# Patient Record
Sex: Female | Born: 1955
Health system: Southern US, Community
[De-identification: ages and names within clinical notes are randomized; demographics above are authoritative.]

## PROBLEM LIST (undated history)

## (undated) DIAGNOSIS — B009 Herpesviral infection, unspecified: Secondary | ICD-10-CM

## (undated) DIAGNOSIS — Z9889 Other specified postprocedural states: Secondary | ICD-10-CM

## (undated) DIAGNOSIS — E039 Hypothyroidism, unspecified: Secondary | ICD-10-CM

## (undated) DIAGNOSIS — Z87898 Personal history of other specified conditions: Secondary | ICD-10-CM

## (undated) DIAGNOSIS — J189 Pneumonia, unspecified organism: Secondary | ICD-10-CM

## (undated) DIAGNOSIS — K759 Inflammatory liver disease, unspecified: Secondary | ICD-10-CM

## (undated) DIAGNOSIS — M545 Low back pain, unspecified: Secondary | ICD-10-CM

## (undated) DIAGNOSIS — G610 Guillain-Barre syndrome: Secondary | ICD-10-CM

## (undated) DIAGNOSIS — M1990 Unspecified osteoarthritis, unspecified site: Secondary | ICD-10-CM

## (undated) DIAGNOSIS — G459 Transient cerebral ischemic attack, unspecified: Secondary | ICD-10-CM

## (undated) DIAGNOSIS — E2740 Unspecified adrenocortical insufficiency: Secondary | ICD-10-CM

## (undated) DIAGNOSIS — K589 Irritable bowel syndrome without diarrhea: Secondary | ICD-10-CM

## (undated) DIAGNOSIS — K219 Gastro-esophageal reflux disease without esophagitis: Secondary | ICD-10-CM

## (undated) DIAGNOSIS — R0600 Dyspnea, unspecified: Secondary | ICD-10-CM

## (undated) DIAGNOSIS — R112 Nausea with vomiting, unspecified: Secondary | ICD-10-CM

## (undated) DIAGNOSIS — F419 Anxiety disorder, unspecified: Secondary | ICD-10-CM

## (undated) DIAGNOSIS — Z8719 Personal history of other diseases of the digestive system: Secondary | ICD-10-CM

## (undated) DIAGNOSIS — G2581 Restless legs syndrome: Secondary | ICD-10-CM

## (undated) DIAGNOSIS — Z8669 Personal history of other diseases of the nervous system and sense organs: Secondary | ICD-10-CM

## (undated) DIAGNOSIS — J45909 Unspecified asthma, uncomplicated: Secondary | ICD-10-CM

## (undated) DIAGNOSIS — G8929 Other chronic pain: Secondary | ICD-10-CM

## (undated) DIAGNOSIS — R0789 Other chest pain: Secondary | ICD-10-CM

## (undated) DIAGNOSIS — J42 Unspecified chronic bronchitis: Secondary | ICD-10-CM

## (undated) HISTORY — DX: Dyspnea, unspecified: R06.00

## (undated) HISTORY — DX: Irritable bowel syndrome, unspecified: K58.9

## (undated) HISTORY — PX: SHOULDER ARTHROSCOPY W/ ROTATOR CUFF REPAIR: SHX2400

## (undated) HISTORY — PX: ANTERIOR CERVICAL DECOMP/DISCECTOMY FUSION: SHX1161

## (undated) HISTORY — DX: Other chest pain: R07.89

## (undated) HISTORY — PX: BREAST BIOPSY: SHX20

## (undated) HISTORY — PX: APPENDECTOMY: SHX54

## (undated) HISTORY — PX: TOTAL SHOULDER REPLACEMENT: SUR1217

## (undated) HISTORY — DX: Anxiety disorder, unspecified: F41.9

## (undated) HISTORY — PX: VAGINAL HYSTERECTOMY: SUR661

## (undated) HISTORY — PX: CHOLECYSTECTOMY OPEN: SUR202

## (undated) HISTORY — PX: BACK SURGERY: SHX140

---
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
## 2001-12-19 ENCOUNTER — Ambulatory Visit (HOSPITAL_COMMUNITY): Admission: RE | Admit: 2001-12-19 | Discharge: 2001-12-19 | Payer: Self-pay | Admitting: Internal Medicine

## 2002-12-18 ENCOUNTER — Ambulatory Visit (HOSPITAL_COMMUNITY): Admission: RE | Admit: 2002-12-18 | Discharge: 2002-12-18 | Payer: Self-pay | Admitting: Family Medicine

## 2002-12-18 ENCOUNTER — Encounter: Payer: Self-pay | Admitting: Family Medicine

## 2003-04-12 ENCOUNTER — Encounter: Payer: Self-pay | Admitting: Neurosurgery

## 2003-04-16 ENCOUNTER — Encounter: Payer: Self-pay | Admitting: Neurosurgery

## 2003-04-16 ENCOUNTER — Inpatient Hospital Stay (HOSPITAL_COMMUNITY): Admission: RE | Admit: 2003-04-16 | Discharge: 2003-04-17 | Payer: Self-pay | Admitting: Neurosurgery

## 2005-07-02 ENCOUNTER — Ambulatory Visit (HOSPITAL_COMMUNITY): Admission: RE | Admit: 2005-07-02 | Discharge: 2005-07-02 | Payer: Self-pay | Admitting: Family Medicine

## 2005-08-04 ENCOUNTER — Ambulatory Visit: Payer: Self-pay | Admitting: Internal Medicine

## 2007-04-04 ENCOUNTER — Ambulatory Visit (HOSPITAL_COMMUNITY): Admission: RE | Admit: 2007-04-04 | Discharge: 2007-04-04 | Payer: Self-pay | Admitting: Family Medicine

## 2008-03-12 ENCOUNTER — Ambulatory Visit (HOSPITAL_COMMUNITY): Admission: RE | Admit: 2008-03-12 | Discharge: 2008-03-12 | Payer: Self-pay | Admitting: Family Medicine

## 2008-09-27 ENCOUNTER — Ambulatory Visit (HOSPITAL_COMMUNITY): Admission: RE | Admit: 2008-09-27 | Discharge: 2008-09-27 | Payer: Self-pay | Admitting: Family Medicine

## 2009-01-25 ENCOUNTER — Ambulatory Visit (HOSPITAL_COMMUNITY): Admission: RE | Admit: 2009-01-25 | Discharge: 2009-01-25 | Payer: Self-pay | Admitting: Family Medicine

## 2009-02-11 ENCOUNTER — Ambulatory Visit (HOSPITAL_COMMUNITY): Admission: RE | Admit: 2009-02-11 | Discharge: 2009-02-11 | Payer: Self-pay | Admitting: Family Medicine

## 2009-11-26 ENCOUNTER — Ambulatory Visit (HOSPITAL_COMMUNITY): Admission: RE | Admit: 2009-11-26 | Discharge: 2009-11-26 | Payer: Self-pay | Admitting: Family Medicine

## 2009-12-13 ENCOUNTER — Ambulatory Visit: Payer: Self-pay | Admitting: Cardiovascular Disease

## 2009-12-13 DIAGNOSIS — R0789 Other chest pain: Secondary | ICD-10-CM

## 2009-12-13 DIAGNOSIS — R0602 Shortness of breath: Secondary | ICD-10-CM

## 2010-05-29 ENCOUNTER — Ambulatory Visit (HOSPITAL_COMMUNITY): Admission: RE | Admit: 2010-05-29 | Discharge: 2010-05-29 | Payer: Self-pay | Admitting: Family Medicine

## 2010-10-07 NOTE — Assessment & Plan Note (Signed)
Summary: **per Dr.Cresenzo for Dyspnea/Atypical Chest Pain/tg   Visit Type:  Initial Consult Primary Provider:  DR.MARK CRESENZO  CC:  SOB, RIGHT ARM PAIN, and CHEST TIGHTNESS.  History of Present Illness: Kathryn [name] is seen today at the request of Dr Jordan Likes.  She has atypical sscp and dyspnea.  She is a previous smoker quitting 10 years ago.  Last CXR showed bronchitic changes.  She has been anxious.  She was given some anxiolytics by Dr Jordan Likes and this helped.  Her chest pressure is usually in associatin with stress including working overtime at the [employer].  She denies fever, sputum cough, diaphoresis, palpitations or syncope.  Her SSCP and dyspnea are persistant since January.  She also has some atypical pains in her right arm and legs.  She has had a previous anterior cervical neck fusion with some arthritis.  Current Problems (verified): 1)  Chest Pain, Atypical  (ICD-786.59) 2)  Dyspnea  (ICD-786.05)  Current Medications (verified): 1)  Valtrex 500 Mg Tabs (Valacyclovir Hcl) .... Take One Tab Daily 2)  Xanax 0.5 Mg Tabs (Alprazolam) .... Take 1/2 Tab As Needed 3)  Protonix 40 Mg Tbec (Pantoprazole Sodium) .... Take 1 Tab Daily 4)  Meloxicam 7.5 Mg Tabs (Meloxicam) .... Take 1 Tab Daily  Allergies (verified): No Known Drug Allergies  Past History:  Past Medical History: Last updated: 12/10/2009 Current Problems:  CHEST PAIN, ATYPICAL (ICD-786.59) DYSPNEA (ICD-786.05)  Past Surgical History: Last updated: 12/10/2009 Anterior cervical decompression and diskectomy and fusion of c5,c6 .(Dr.James R Hirsch,M.D)  Family History: Last updated: 12/13/2009 non-contributory  Social History: Last updated: 12/13/2009 Works at [employer] Quit smoking 10 years ago [employer] but no other activity Denies ETOH and drugs  Family History: non-contributory  Social History: Works at [employer] Quit smoking 10 years ago [employer] but no other activity Denies  ETOH and drugs  Review of Systems       Denies fever, malais, weight loss, blurry vision, decreased visual acuity, cough, sputum, , hemoptysis, pleuritic pain, palpitaitons, heartburn, abdominal pain, melena, lower extremity edema, claudication, or rash.   Vital Signs:  Patient profile:   55 year old female Height:      67 inches Weight:      148 pounds BMI:     23.26 Pulse rate:   59 / minute BP sitting:   122 / 66  (right arm)  Vitals Entered By: Dreama Saa, CNA (December 13, 2009 11:21 AM)  Physical Exam  General:  Affect appropriate Healthy:  appears stated age HEENT: normal Neck supple with no adenopathy JVP normal no bruits no thyromegaly Lungs clear with no wheezing and good diaphragmatic motion Heart:  S1/S2 no murmur,rub, gallop or click PMI normal Abdomen: benighn, BS positve, no tenderness, no AAA no bruit.  No HSM or HJR Distal pulses intact with no bruits No edema Neuro non-focal Skin warm and dry Anterior cervical fusion   Impression & Recommendations:  Problem # 1:  CHEST PAIN, ATYPICAL (ICD-786.59) Doubt cardiac etiology.  Likely related to anxiety Stress echo Orders: Stress Echo (Stress Echo)  Problem # 2:  DYSPNEA (ICD-786.05) Previous smoker with bronchitic changes on CXR.  PFT's and echo  F/U Crezenzo Orders: Pulmonary Function Test (PFT)  Patient Instructions: 1)  Your physician recommends that you schedule a follow-up appointment in: as needed (may need follow uo depending tests results). 2)  Your physician has requested that you have a stress echocardiogram. For further information please visit [email or web address]  Please follow  instruction sheet as given. 3)  Your physician has recommended that you have a pulmonary function test.  Pulmonary Function Tests are a group of tests that measure how well air moves in and out of your lungs.   Echocardiogram Report  Procedure date:  11/26/2009  Findings:      NSR  Normal ECG

## 2010-10-07 NOTE — Letter (Signed)
Summary: Stress Echocardiogram Information Sheet  [facility]  [address] [place] [address]   Phone: [phone]  Fax: [phone]      December 13, 2009 MRN: [id number] light prior to the test.   Kathryn [name]  Doctor: Appointment Date: Appointment Time: Appointment Location: [facility]  Stress Echocardiogram Information Sheet    Instructions:   1. DO NOT  take your _________ medicine _______ days before the test.  2. Do not eat or drink after midnight the night before test.  3. Dress prepared to exercise.  4. DO NOT use ANY caffine or tobacco products 3 hours before appointment.  5. Report to the Short Stay Center on the1st floor.  6. Please bring all current prescription medications.  7. If you have any questions, please call [phone]

## 2011-01-23 NOTE — Op Note (Signed)
NAME:  Kathryn [name], Kathryn [name]                          ACCOUNT NO.:  [id number]   MEDICAL RECORD NO.:  [id number]                   PATIENT TYPE:  INP   LOCATION:  3172                                 FACILITY:  [facility]   PHYSICIAN:  Clydene Fake, M.D.               DATE OF BIRTH:  [birth year]   DATE OF PROCEDURE:  04/16/2003  DATE OF DISCHARGE:                                 OPERATIVE REPORT   PREOPERATIVE DIAGNOSIS:  Herniated nucleus pulposis and spondylosis of C5-6  and C6-7 with right-sided radiculopathy.   POSTOPERATIVE DIAGNOSIS:  Herniated nucleus pulposis and spondylosis of C5-6  and C6-7 with right-sided radiculopathy.   PROCEDURES:  Anterior cervical decompression and diskectomy and fusion of C5-  6 and C6-7 with LifeNet allograft to bone and tether anterior cervical  plate.   SURGEON:  Clydene Fake, M.D.   ASSISTANT:  Coletta Memos, M.D.   ANESTHESIA:  General endotracheal tube __________.   DRAINS:  None.   COMPLICATIONS:  None.   REASON FOR PROCEDURE:  The patient is a 55 year old woman who has had neck  and right arm pain and numbness not improving with conservative management.  MRI was done showing spondylosis, HNP, and foraminal narrowing at C5-6 and  C6-7, worse on the right.  The patient was brought in for decompression and  fusion.   PROCEDURE IN DETAIL:  The patient was brought into the operating room.  General anesthesia was induced.  The patient was placed in traction with 10  pounds and prepped and draped in the sterile fashion.  The cervical incision  was injected with 10 mL of 1% lidocaine with epinephrine.  After she was  prepped and draped in the sterile fashion, an incision was then made in the  left side of the neck through the midline to the anterior border of the  sternocleidomastoid muscle.  The incision was taken down to the platysma.  Hemostasis was obtained with Bovie cauterization.  The platysma was opened  with the Bovie and blunt  dissection was taken through the anterior cervical  fascia of the anterior cervical spine.  Large osteophytes were seen.  This  was felt to be C6-7.  A needle was placed in the disk space above that.  X-  rays were obtained confirming our position at C5-6.  The disk space was  incised and partial diskectomy was performed with pituitary rongeur as the  needle was removed.  The longus colli muscle was reflected laterally on each  side using the Bovie.  A self-retaining retractor system was placed.  Extraction pins were placed in C5 and C7.  Osteophyte was removed at C6-7.  Diskectomy was performed with pituitary rongeurs and curets at both C5-6 and  C6-7.  Kerrison punches were used to remove osteophytes.  Curets and  pituitary rongeurs were used to continue with the diskectomy and then a 1 mm  Kerrison punch  was used to remove posterior osteophytes and the posterior  ligament.  Then 2 mm clips were used to finish the decompression, removing  osteophytes and decompressing the central canal.  We performed bilateral  foraminotomies on the right side at C5-6.  A disk fragment was seen out in  the canal.  This was removed, decompressing the root.  When we were  finished, we had decompression of the roots at C5-6, especially on the right  side.  This process was repeated at C6-7 with decompression of the central  canal and bilateral foramen.  Hemostasis was obtained with Gelfoam.  This  was then irrigated out.  The interspaces were measured with the LifeNet bone  trial and they both  measured for 6 mm grafts.  The broach was used to  roughen the inputs and a 6 mm bone graft was tacked into both C5-6 and C6-7  and countersunk about 1 mm.  We checked behind the graft and there was  plenty of room between bone graft and dura at both levels.  Distraction pins  were removed.  Hemostasis was obtained with Gelfoam and thrombin.  A tether  anterior cervical splint was placed on the anterior cervical spine  with two  screws placed in C7, two screws placed in C5, and one in C6.  They were  taken down and x-ray was obtained showing good position of plates, screws,  and interbody bone plugs at both C5-6 and C6-7.  The wound was irrigated  with antibiotic suture.  The retractor was removed.  Hemostasis was obtained  with bipolar cauterization and Gelfoam.  The Gelfoam was irrigated out.  The  platysma was closed with 3-0 Vicryl interrupted suture.  The subcutaneous  tissue was closed with the same and the skin was closed with Benzoin and  Steri-Strips.  The patient had a dressing placed and a soft cervical collar  placed.  She was awoken from anesthesia and transferred to the recovery room  in stable condition.                                               Clydene Fake, M.D.    JRH/MEDQ  D:  04/16/2003  T:  04/16/2003  Job:  [id number]

## 2011-08-19 ENCOUNTER — Encounter: Payer: Self-pay | Admitting: Cardiology

## 2012-04-05 ENCOUNTER — Other Ambulatory Visit (HOSPITAL_COMMUNITY): Payer: Self-pay | Admitting: Family Medicine

## 2012-04-05 DIAGNOSIS — M549 Dorsalgia, unspecified: Secondary | ICD-10-CM

## 2012-04-05 DIAGNOSIS — K4090 Unilateral inguinal hernia, without obstruction or gangrene, not specified as recurrent: Secondary | ICD-10-CM

## 2012-04-06 ENCOUNTER — Ambulatory Visit (HOSPITAL_COMMUNITY)
Admission: RE | Admit: 2012-04-06 | Discharge: 2012-04-06 | Disposition: A | Discharge status: Home / Self Care | Payer: BC Managed Care – PPO | Source: Ambulatory Visit | Attending: Family Medicine | Admitting: Family Medicine

## 2012-04-06 DIAGNOSIS — K4090 Unilateral inguinal hernia, without obstruction or gangrene, not specified as recurrent: Secondary | ICD-10-CM | POA: Insufficient documentation

## 2012-04-06 DIAGNOSIS — M549 Dorsalgia, unspecified: Secondary | ICD-10-CM | POA: Insufficient documentation

## 2012-07-21 ENCOUNTER — Ambulatory Visit (HOSPITAL_COMMUNITY)
Admission: RE | Admit: 2012-07-21 | Discharge: 2012-07-21 | Disposition: A | Discharge status: Home / Self Care | Payer: BC Managed Care – PPO | Source: Ambulatory Visit | Attending: Family Medicine | Admitting: Family Medicine

## 2012-07-21 ENCOUNTER — Other Ambulatory Visit (HOSPITAL_COMMUNITY): Payer: Self-pay | Admitting: Family Medicine

## 2012-07-21 DIAGNOSIS — R079 Chest pain, unspecified: Secondary | ICD-10-CM | POA: Insufficient documentation

## 2012-07-21 DIAGNOSIS — R0602 Shortness of breath: Secondary | ICD-10-CM | POA: Insufficient documentation

## 2013-05-31 ENCOUNTER — Other Ambulatory Visit: Payer: Self-pay | Admitting: Neurosurgery

## 2013-06-14 ENCOUNTER — Encounter (HOSPITAL_COMMUNITY): Payer: Self-pay | Admitting: Pharmacy Technician

## 2013-06-15 ENCOUNTER — Encounter (HOSPITAL_COMMUNITY): Payer: Self-pay

## 2013-06-15 ENCOUNTER — Encounter (HOSPITAL_COMMUNITY)
Admission: RE | Admit: 2013-06-15 | Discharge: 2013-06-15 | Disposition: A | Discharge status: Home / Self Care | Payer: BC Managed Care – PPO | Source: Ambulatory Visit | Attending: Neurosurgery | Admitting: Neurosurgery

## 2013-06-15 DIAGNOSIS — Z01812 Encounter for preprocedural laboratory examination: Secondary | ICD-10-CM | POA: Insufficient documentation

## 2013-06-15 HISTORY — DX: Nausea with vomiting, unspecified: R11.2

## 2013-06-15 HISTORY — DX: Guillain-Barre syndrome: G61.0

## 2013-06-15 HISTORY — DX: Gastro-esophageal reflux disease without esophagitis: K21.9

## 2013-06-15 HISTORY — DX: Herpesviral infection, unspecified: B00.9

## 2013-06-15 HISTORY — DX: Other specified postprocedural states: Z98.890

## 2013-06-15 HISTORY — DX: Personal history of other diseases of the digestive system: Z87.19

## 2013-06-15 HISTORY — DX: Unspecified osteoarthritis, unspecified site: M19.90

## 2013-06-15 HISTORY — DX: Inflammatory liver disease, unspecified: K75.9

## 2013-06-15 HISTORY — DX: Personal history of other specified conditions: Z87.898

## 2013-06-15 LAB — URINALYSIS, ROUTINE W REFLEX MICROSCOPIC
Glucose, UA: NEGATIVE mg/dL
Hgb urine dipstick: NEGATIVE
Leukocytes, UA: NEGATIVE
Nitrite: NEGATIVE
Protein, ur: NEGATIVE mg/dL
Specific Gravity, Urine: 1.005 (ref 1.005–1.030)
pH: 5.5 (ref 5.0–8.0)

## 2013-06-15 LAB — BASIC METABOLIC PANEL
CO2: 27 mEq/L (ref 19–32)
Calcium: 9.3 mg/dL (ref 8.4–10.5)
GFR calc Af Amer: 88 mL/min — ABNORMAL LOW (ref >90)
GFR calc non Af Amer: 76 mL/min — ABNORMAL LOW (ref >90)
Sodium: 141 mEq/L (ref 135–145)

## 2013-06-15 LAB — CBC WITH DIFFERENTIAL/PLATELET
Basophils Absolute: 0 10*3/uL (ref 0.0–0.1)
Eosinophils Absolute: 0.1 10*3/uL (ref 0.0–0.7)
HCT: 37.1 % (ref 36.0–46.0)
Lymphocytes Relative: 33 % (ref 12–46)
Lymphs Abs: 2.1 10*3/uL (ref 0.7–4.0)
Neutro Abs: 3.5 10*3/uL (ref 1.7–7.7)
Neutrophils Relative %: 56 % (ref 43–77)
Platelets: 263 10*3/uL (ref 150–400)
RBC: 3.98 MIL/uL (ref 3.87–5.11)
WBC: 6.3 10*3/uL (ref 4.0–10.5)

## 2013-06-15 LAB — TYPE AND SCREEN
ABO/RH(D): AB NEG
Antibody Screen: NEGATIVE

## 2013-06-15 LAB — SURGICAL PCR SCREEN
MRSA, PCR: NEGATIVE
Staphylococcus aureus: NEGATIVE

## 2013-06-15 LAB — PROTIME-INR: Prothrombin Time: 12.6 seconds (ref 11.6–15.2)

## 2013-06-15 LAB — APTT: aPTT: 28 seconds (ref 24–37)

## 2013-06-15 LAB — ABO/RH: ABO/RH(D): AB NEG

## 2013-06-15 NOTE — Progress Notes (Signed)
Primary physician - [facility] Does not see a cardiologist on routine basis. Did see Susank one time but did not followup

## 2013-06-15 NOTE — Pre-Procedure Instructions (Signed)
Kathryn [name]  06/15/2013   Your procedure is scheduled on:  Thursday, October 16th  Report to Main Entrance "A" and check in with admitting at 0730 AM.  Call this number if you have problems the morning of surgery: [phone]   Remember:   Do not eat food or drink liquids after midnight.   Take these medicines the morning of surgery with A SIP OF WATER: xanax if needed, protonix, claritin, eye drops if needed  Stop taking ibuprofen, over the counter vitamins/herbal medications, meloxicam 5 days prior to surgery   Do not wear jewelry, make-up or nail polish.  Do not wear lotions, powders, or perfumes. You may wear deodorant.  Do not shave 48 hours prior to surgery. Men may shave face and neck.  Do not bring valuables to the hospital.  [facility] is not responsible   for any belongings or valuables.               Contacts, dentures or bridgework may not be worn into surgery.  Leave suitcase in the car. After surgery it may be brought to your room.  For patients admitted to the hospital, discharge time is determined by your  treatment team.               Patients discharged the day of surgery will not be allowed to drive home.   Special Instructions: Shower using CHG 2 nights before surgery and the night before surgery.  If you shower the day of surgery use CHG.  Use special wash - you have one bottle of CHG for all showers.  You should use approximately 1/3 of the bottle for each shower.   Please read over the following fact sheets that you were given: Pain Booklet, Coughing and Deep Breathing, Blood Transfusion Information, MRSA Information and Surgical Site Infection Prevention

## 2013-06-21 MED ORDER — CEFAZOLIN SODIUM-DEXTROSE 2-3 GM-% IV SOLR
2.0000 g | INTRAVENOUS | Status: AC
  Administered 2013-06-22: 2 g via INTRAVENOUS
  Filled 2013-06-21: qty 50

## 2013-06-22 ENCOUNTER — Inpatient Hospital Stay (HOSPITAL_COMMUNITY)
Admission: RE | Admit: 2013-06-22 | Discharge: 2013-06-23 | DRG: 460 | Disposition: A | Discharge status: Home / Self Care | Payer: BC Managed Care – PPO | Source: Ambulatory Visit | Attending: Neurosurgery | Admitting: Neurosurgery

## 2013-06-22 ENCOUNTER — Encounter (HOSPITAL_COMMUNITY)
Admission: RE | Disposition: A | Discharge status: Home / Self Care | Payer: Self-pay | Source: Ambulatory Visit | Attending: Neurosurgery

## 2013-06-22 ENCOUNTER — Inpatient Hospital Stay (HOSPITAL_COMMUNITY): Payer: BC Managed Care – PPO | Admitting: Anesthesiology

## 2013-06-22 ENCOUNTER — Encounter (HOSPITAL_COMMUNITY): Payer: BC Managed Care – PPO | Admitting: Anesthesiology

## 2013-06-22 ENCOUNTER — Encounter (HOSPITAL_COMMUNITY): Payer: Self-pay | Admitting: *Deleted

## 2013-06-22 ENCOUNTER — Inpatient Hospital Stay (HOSPITAL_COMMUNITY): Payer: BC Managed Care – PPO

## 2013-06-22 DIAGNOSIS — F411 Generalized anxiety disorder: Secondary | ICD-10-CM | POA: Diagnosis present

## 2013-06-22 DIAGNOSIS — M5126 Other intervertebral disc displacement, lumbar region: Principal | ICD-10-CM | POA: Diagnosis present

## 2013-06-22 DIAGNOSIS — K449 Diaphragmatic hernia without obstruction or gangrene: Secondary | ICD-10-CM | POA: Diagnosis present

## 2013-06-22 DIAGNOSIS — K219 Gastro-esophageal reflux disease without esophagitis: Secondary | ICD-10-CM | POA: Diagnosis present

## 2013-06-22 DIAGNOSIS — Z79899 Other long term (current) drug therapy: Secondary | ICD-10-CM

## 2013-06-22 DIAGNOSIS — Q762 Congenital spondylolisthesis: Secondary | ICD-10-CM

## 2013-06-22 DIAGNOSIS — M47817 Spondylosis without myelopathy or radiculopathy, lumbosacral region: Secondary | ICD-10-CM | POA: Diagnosis present

## 2013-06-22 HISTORY — PX: LUMBAR LAMINECTOMY/DECOMPRESSION MICRODISCECTOMY: SHX5026

## 2013-06-22 SURGERY — POSTERIOR LUMBAR FUSION 1 LEVEL
Anesthesia: General | Site: Spine Lumbar | Laterality: Right | Wound class: Clean

## 2013-06-22 MED ORDER — LIDOCAINE HCL (CARDIAC) 20 MG/ML IV SOLN
INTRAVENOUS | Status: DC | PRN
  Administered 2013-06-22: 100 mg via INTRAVENOUS

## 2013-06-22 MED ORDER — ONDANSETRON HCL 4 MG/2ML IJ SOLN: 4.0000 mg | Freq: Four times a day (QID) | INTRAMUSCULAR | Status: DC | PRN

## 2013-06-22 MED ORDER — NEOSTIGMINE METHYLSULFATE 1 MG/ML IJ SOLN
INTRAMUSCULAR | Status: DC | PRN
  Administered 2013-06-22: 3 mg via INTRAVENOUS

## 2013-06-22 MED ORDER — MEPERIDINE HCL 25 MG/ML IJ SOLN: 6.2500 mg | INTRAMUSCULAR | Status: DC | PRN

## 2013-06-22 MED ORDER — ACETAMINOPHEN 650 MG RE SUPP: 650.0000 mg | RECTAL | Status: DC | PRN

## 2013-06-22 MED ORDER — PSEUDOEPHEDRINE HCL 30 MG PO TABS
30.0000 mg | ORAL_TABLET | ORAL | Status: DC | PRN
  Filled 2013-06-22: qty 1

## 2013-06-22 MED ORDER — LORATADINE 10 MG PO TABS
10.0000 mg | ORAL_TABLET | Freq: Every day | ORAL | Status: DC | PRN
  Filled 2013-06-22: qty 1

## 2013-06-22 MED ORDER — SODIUM CHLORIDE 0.9 % IJ SOLN
3.0000 mL | Freq: Two times a day (BID) | INTRAMUSCULAR | Status: DC
  Administered 2013-06-22: 3 mL via INTRAVENOUS

## 2013-06-22 MED ORDER — TRAMADOL HCL 50 MG PO TABS: 100.0000 mg | ORAL_TABLET | Freq: Four times a day (QID) | ORAL | Status: DC | PRN

## 2013-06-22 MED ORDER — ZOLPIDEM TARTRATE 5 MG PO TABS: 5.0000 mg | ORAL_TABLET | Freq: Every evening | ORAL | Status: DC | PRN

## 2013-06-22 MED ORDER — ONDANSETRON HCL 4 MG/2ML IJ SOLN: 4.0000 mg | Freq: Once | INTRAMUSCULAR | Status: DC | PRN

## 2013-06-22 MED ORDER — BISACODYL 10 MG RE SUPP: 10.0000 mg | Freq: Every day | RECTAL | Status: DC | PRN

## 2013-06-22 MED ORDER — PANTOPRAZOLE SODIUM 40 MG PO TBEC
40.0000 mg | DELAYED_RELEASE_TABLET | Freq: Every day | ORAL | Status: DC
  Administered 2013-06-23: 40 mg via ORAL
  Filled 2013-06-22: qty 1

## 2013-06-22 MED ORDER — SODIUM CHLORIDE 0.9 % IR SOLN
Status: DC | PRN
  Administered 2013-06-22: 11:00:00

## 2013-06-22 MED ORDER — ALPRAZOLAM 0.5 MG PO TABS: 0.5000 mg | ORAL_TABLET | Freq: Every day | ORAL | Status: DC | PRN

## 2013-06-22 MED ORDER — DEXAMETHASONE SODIUM PHOSPHATE 4 MG/ML IJ SOLN
INTRAMUSCULAR | Status: DC | PRN
  Administered 2013-06-22: 8 mg via INTRAVENOUS

## 2013-06-22 MED ORDER — LACTATED RINGERS IV SOLN
INTRAVENOUS | Status: DC
  Administered 2013-06-22: 19:00:00 via INTRAVENOUS

## 2013-06-22 MED ORDER — LIDOCAINE-EPINEPHRINE 1 %-1:100000 IJ SOLN
INTRAMUSCULAR | Status: DC | PRN
  Administered 2013-06-22: 19 mL

## 2013-06-22 MED ORDER — THROMBIN 20000 UNITS EX SOLR
CUTANEOUS | Status: DC | PRN
  Administered 2013-06-22: 11:00:00 via TOPICAL

## 2013-06-22 MED ORDER — SCOPOLAMINE 1 MG/3DAYS TD PT72
MEDICATED_PATCH | TRANSDERMAL | Status: DC | PRN
  Administered 2013-06-22: 1 via TRANSDERMAL

## 2013-06-22 MED ORDER — LACTATED RINGERS IV SOLN
INTRAVENOUS | Status: DC | PRN
  Administered 2013-06-22 (×2): via INTRAVENOUS

## 2013-06-22 MED ORDER — ARTIFICIAL TEARS OP OINT
TOPICAL_OINTMENT | OPHTHALMIC | Status: DC | PRN
  Administered 2013-06-22: 1 via OPHTHALMIC

## 2013-06-22 MED ORDER — PHENYLEPHRINE HCL 10 MG/ML IJ SOLN
10.0000 mg | INTRAVENOUS | Status: DC | PRN
  Administered 2013-06-22: 20 ug/min via INTRAVENOUS

## 2013-06-22 MED ORDER — PRAMIPEXOLE DIHYDROCHLORIDE 0.125 MG PO TABS
0.1250 mg | ORAL_TABLET | Freq: Every day | ORAL | Status: DC
  Administered 2013-06-22: 0.125 mg via ORAL
  Filled 2013-06-22 (×2): qty 1

## 2013-06-22 MED ORDER — MORPHINE SULFATE (PF) 1 MG/ML IV SOLN
INTRAVENOUS | Status: DC
  Administered 2013-06-22: 4 mg via INTRAVENOUS
  Administered 2013-06-22 – 2013-06-23 (×4): 1 mg via INTRAVENOUS

## 2013-06-22 MED ORDER — CEFAZOLIN SODIUM 1-5 GM-% IV SOLN
1.0000 g | Freq: Three times a day (TID) | INTRAVENOUS | Status: AC
  Administered 2013-06-22 – 2013-06-23 (×2): 1 g via INTRAVENOUS
  Filled 2013-06-22 (×2): qty 50

## 2013-06-22 MED ORDER — GLYCOPYRROLATE 0.2 MG/ML IJ SOLN
INTRAMUSCULAR | Status: DC | PRN
  Administered 2013-06-22: 0.4 mg via INTRAVENOUS

## 2013-06-22 MED ORDER — ACETAMINOPHEN 325 MG PO TABS: 650.0000 mg | ORAL_TABLET | ORAL | Status: DC | PRN

## 2013-06-22 MED ORDER — DEXTROSE 5 % IV SOLN
INTRAVENOUS | Status: DC | PRN
  Administered 2013-06-22: 10:00:00 via INTRAVENOUS

## 2013-06-22 MED ORDER — POLYVINYL ALCOHOL 1.4 % OP SOLN
1.0000 [drp] | OPHTHALMIC | Status: DC | PRN
  Filled 2013-06-22: qty 15

## 2013-06-22 MED ORDER — VITAMIN B-12 100 MCG PO TABS
100.0000 ug | ORAL_TABLET | Freq: Every day | ORAL | Status: DC
  Administered 2013-06-22: 100 ug via ORAL
  Filled 2013-06-22 (×2): qty 1

## 2013-06-22 MED ORDER — HYDROMORPHONE HCL PF 1 MG/ML IJ SOLN
0.2500 mg | INTRAMUSCULAR | Status: DC | PRN
  Administered 2013-06-22 (×2): 0.5 mg via INTRAVENOUS

## 2013-06-22 MED ORDER — VITAMIN B-6 100 MG PO TABS
100.0000 mg | ORAL_TABLET | Freq: Every day | ORAL | Status: DC
  Administered 2013-06-22: 100 mg via ORAL
  Filled 2013-06-22 (×2): qty 1

## 2013-06-22 MED ORDER — ROCURONIUM BROMIDE 100 MG/10ML IV SOLN
INTRAVENOUS | Status: DC | PRN
  Administered 2013-06-22: 20 mg via INTRAVENOUS
  Administered 2013-06-22: 50 mg via INTRAVENOUS

## 2013-06-22 MED ORDER — HYDROMORPHONE HCL PF 1 MG/ML IJ SOLN
INTRAMUSCULAR | Status: AC
  Filled 2013-06-22: qty 1

## 2013-06-22 MED ORDER — ONDANSETRON HCL 4 MG/2ML IJ SOLN
4.0000 mg | INTRAMUSCULAR | Status: DC | PRN
  Administered 2013-06-22: 4 mg via INTRAVENOUS
  Filled 2013-06-22: qty 2

## 2013-06-22 MED ORDER — SCOPOLAMINE 1 MG/3DAYS TD PT72
MEDICATED_PATCH | TRANSDERMAL | Status: AC
  Filled 2013-06-22: qty 1

## 2013-06-22 MED ORDER — NALOXONE HCL 0.4 MG/ML IJ SOLN
0.4000 mg | INTRAMUSCULAR | Status: DC | PRN
  Filled 2013-06-22: qty 1

## 2013-06-22 MED ORDER — KETOROLAC TROMETHAMINE 30 MG/ML IJ SOLN
30.0000 mg | Freq: Four times a day (QID) | INTRAMUSCULAR | Status: DC
  Administered 2013-06-22 – 2013-06-23 (×3): 30 mg via INTRAVENOUS
  Filled 2013-06-22 (×4): qty 1

## 2013-06-22 MED ORDER — OXYCODONE HCL 5 MG/5ML PO SOLN: 5.0000 mg | Freq: Once | ORAL | Status: AC | PRN

## 2013-06-22 MED ORDER — METHOCARBAMOL 500 MG PO TABS: 500.0000 mg | ORAL_TABLET | Freq: Four times a day (QID) | ORAL | Status: DC | PRN

## 2013-06-22 MED ORDER — OXYCODONE HCL 5 MG PO TABS
5.0000 mg | ORAL_TABLET | Freq: Once | ORAL | Status: AC | PRN
  Administered 2013-06-22: 5 mg via ORAL

## 2013-06-22 MED ORDER — LACTATED RINGERS IV SOLN
INTRAVENOUS | Status: DC
  Administered 2013-06-22: 07:00:00 via INTRAVENOUS

## 2013-06-22 MED ORDER — CYCLOBENZAPRINE HCL 10 MG PO TABS
10.0000 mg | ORAL_TABLET | Freq: Three times a day (TID) | ORAL | Status: DC | PRN
  Administered 2013-06-22: 10 mg via ORAL
  Filled 2013-06-22: qty 1

## 2013-06-22 MED ORDER — SODIUM CHLORIDE 0.9 % IV SOLN: 250.0000 mL | INTRAVENOUS | Status: DC

## 2013-06-22 MED ORDER — PROPOFOL 10 MG/ML IV BOLUS
INTRAVENOUS | Status: DC | PRN
  Administered 2013-06-22: 125 mg via INTRAVENOUS

## 2013-06-22 MED ORDER — MAGNESIUM HYDROXIDE 400 MG/5ML PO SUSP: 30.0000 mL | Freq: Every day | ORAL | Status: DC | PRN

## 2013-06-22 MED ORDER — OXYCODONE HCL 5 MG PO TABS
ORAL_TABLET | ORAL | Status: AC
  Filled 2013-06-22: qty 1

## 2013-06-22 MED ORDER — SODIUM CHLORIDE 0.9 % IJ SOLN: 9.0000 mL | INTRAMUSCULAR | Status: DC | PRN

## 2013-06-22 MED ORDER — VALACYCLOVIR HCL 500 MG PO TABS
500.0000 mg | ORAL_TABLET | Freq: Every day | ORAL | Status: DC
  Administered 2013-06-22: 500 mg via ORAL
  Filled 2013-06-22 (×2): qty 1

## 2013-06-22 MED ORDER — ONDANSETRON HCL 4 MG/2ML IJ SOLN
INTRAMUSCULAR | Status: DC | PRN
  Administered 2013-06-22: 4 mg via INTRAMUSCULAR

## 2013-06-22 MED ORDER — DOCUSATE SODIUM 100 MG PO CAPS
100.0000 mg | ORAL_CAPSULE | Freq: Two times a day (BID) | ORAL | Status: DC
  Administered 2013-06-22: 100 mg via ORAL
  Filled 2013-06-22: qty 1

## 2013-06-22 MED ORDER — DIPHENHYDRAMINE HCL 12.5 MG/5ML PO ELIX
12.5000 mg | ORAL_SOLUTION | Freq: Four times a day (QID) | ORAL | Status: DC | PRN
  Filled 2013-06-22: qty 5

## 2013-06-22 MED ORDER — VITAMIN D3 25 MCG (1000 UNIT) PO TABS
1000.0000 [IU] | ORAL_TABLET | Freq: Every day | ORAL | Status: DC
  Administered 2013-06-22: 1000 [IU] via ORAL
  Filled 2013-06-22 (×2): qty 1

## 2013-06-22 MED ORDER — DIPHENHYDRAMINE HCL 50 MG/ML IJ SOLN
12.5000 mg | Freq: Four times a day (QID) | INTRAMUSCULAR | Status: DC | PRN
  Filled 2013-06-22: qty 0.25

## 2013-06-22 MED ORDER — 0.9 % SODIUM CHLORIDE (POUR BTL) OPTIME
TOPICAL | Status: DC | PRN
  Administered 2013-06-22: 1000 mL

## 2013-06-22 MED ORDER — HYPROMELLOSE (GONIOSCOPIC) 2.5 % OP SOLN: 2.0000 [drp] | Freq: Three times a day (TID) | OPHTHALMIC | Status: DC | PRN

## 2013-06-22 MED ORDER — FENTANYL CITRATE 0.05 MG/ML IJ SOLN
INTRAMUSCULAR | Status: DC | PRN
  Administered 2013-06-22: 100 ug via INTRAVENOUS
  Administered 2013-06-22: 50 ug via INTRAVENOUS
  Administered 2013-06-22: 150 ug via INTRAVENOUS

## 2013-06-22 MED ORDER — MIDAZOLAM HCL 5 MG/5ML IJ SOLN
INTRAMUSCULAR | Status: DC | PRN
  Administered 2013-06-22: 2 mg via INTRAVENOUS

## 2013-06-22 MED ORDER — MORPHINE SULFATE (PF) 1 MG/ML IV SOLN
INTRAVENOUS | Status: AC
  Filled 2013-06-22: qty 25

## 2013-06-22 MED ORDER — METHOCARBAMOL 100 MG/ML IJ SOLN
500.0000 mg | Freq: Four times a day (QID) | INTRAMUSCULAR | Status: DC | PRN
  Filled 2013-06-22: qty 5

## 2013-06-22 MED ORDER — SODIUM CHLORIDE 0.9 % IJ SOLN: 3.0000 mL | INTRAMUSCULAR | Status: DC | PRN

## 2013-06-22 SURGICAL SUPPLY — 75 items
APL SKNCLS STERI-STRIP NONHPOA (GAUZE/BANDAGES/DRESSINGS) ×4
BAG DECANTER FOR FLEXI CONT (MISCELLANEOUS) ×5 IMPLANT
BENZOIN TINCTURE PRP APPL 2/3 (GAUZE/BANDAGES/DRESSINGS) ×6 IMPLANT
BLADE SURG ROTATE 9660 (MISCELLANEOUS) IMPLANT
BUR PRECISION FLUTE 5.0 (BURR) ×3 IMPLANT
BUR ROUND FLUTED 5 RND (BURR) ×3 IMPLANT
CAGE CONCORDE BULLET 9X10X23 (Cage) ×2 IMPLANT
CANISTER SUCTION 2500CC (MISCELLANEOUS) ×5 IMPLANT
CONT SPEC 4OZ CLIKSEAL STRL BL (MISCELLANEOUS) ×6 IMPLANT
COVER BACK TABLE 24X17X13 BIG (DRAPES) IMPLANT
COVER TABLE BACK 60X90 (DRAPES) ×3 IMPLANT
DECANTER SPIKE VIAL GLASS SM (MISCELLANEOUS) ×3 IMPLANT
DRAPE C-ARM 42X72 X-RAY (DRAPES) ×6 IMPLANT
DRAPE LAPAROTOMY 100X72X124 (DRAPES) ×5 IMPLANT
DRAPE MICROSCOPE LEICA (MISCELLANEOUS) ×2 IMPLANT
DRAPE POUCH INSTRU U-SHP 10X18 (DRAPES) ×5 IMPLANT
DRAPE PROXIMA HALF (DRAPES) ×1 IMPLANT
DRAPE SURG 17X23 STRL (DRAPES) ×3 IMPLANT
DRESSING TELFA 8X3 (GAUZE/BANDAGES/DRESSINGS) ×5 IMPLANT
DURAPREP 26ML APPLICATOR (WOUND CARE) ×5 IMPLANT
ELECT REM PT RETURN 9FT ADLT (ELECTROSURGICAL) ×3
ELECTRODE REM PT RTRN 9FT ADLT (ELECTROSURGICAL) ×4 IMPLANT
GAUZE SPONGE 4X4 16PLY XRAY LF (GAUZE/BANDAGES/DRESSINGS) ×1 IMPLANT
GLOVE BIO SURGEON STRL SZ8 (GLOVE) ×1 IMPLANT
GLOVE BIOGEL PI IND STRL 7.0 (GLOVE) IMPLANT
GLOVE BIOGEL PI IND STRL 7.5 (GLOVE) IMPLANT
GLOVE BIOGEL PI IND STRL 8.5 (GLOVE) IMPLANT
GLOVE BIOGEL PI INDICATOR 7.0 (GLOVE) ×1
GLOVE BIOGEL PI INDICATOR 7.5 (GLOVE) ×2
GLOVE BIOGEL PI INDICATOR 8.5 (GLOVE) ×1
GLOVE ECLIPSE 7.5 STRL STRAW (GLOVE) ×8 IMPLANT
GLOVE EXAM NITRILE LRG STRL (GLOVE) IMPLANT
GLOVE EXAM NITRILE MD LF STRL (GLOVE) IMPLANT
GLOVE EXAM NITRILE XL STR (GLOVE) IMPLANT
GLOVE EXAM NITRILE XS STR PU (GLOVE) IMPLANT
GLOVE SURG SS PI 7.0 STRL IVOR (GLOVE) ×3 IMPLANT
GOWN BRE IMP SLV AUR LG STRL (GOWN DISPOSABLE) ×4 IMPLANT
GOWN BRE IMP SLV AUR XL STRL (GOWN DISPOSABLE) ×4 IMPLANT
GOWN STRL REIN 2XL LVL4 (GOWN DISPOSABLE) ×4 IMPLANT
KIT BASIN OR (CUSTOM PROCEDURE TRAY) ×5 IMPLANT
KIT ROOM TURNOVER OR (KITS) ×5 IMPLANT
NDL HYPO 18GX1.5 BLUNT FILL (NEEDLE) IMPLANT
NDL SPNL 18GX3.5 QUINCKE PK (NEEDLE) IMPLANT
NEEDLE HYPO 18GX1.5 BLUNT FILL (NEEDLE) IMPLANT
NEEDLE HYPO 22GX1.5 SAFETY (NEEDLE) ×8 IMPLANT
NEEDLE SPNL 18GX3.5 QUINCKE PK (NEEDLE) ×3 IMPLANT
NS IRRIG 1000ML POUR BTL (IV SOLUTION) ×5 IMPLANT
PACK LAMINECTOMY NEURO (CUSTOM PROCEDURE TRAY) ×5 IMPLANT
PAD ARMBOARD 7.5X6 YLW CONV (MISCELLANEOUS) ×17 IMPLANT
PATTIES SURGICAL .75X.75 (GAUZE/BANDAGES/DRESSINGS) ×5 IMPLANT
ROD PRE BENT EXPEDIUM 35MM (Rod) ×2 IMPLANT
RUBBERBAND STERILE (MISCELLANEOUS) ×4 IMPLANT
SCREW EXPEDIUM POLYAXIAL 6X45M (Screw) ×2 IMPLANT
SCREW EXPEDIUM POLYAXIAL 6X50M (Screw) ×2 IMPLANT
SCREW SET SINGLE INNER (Screw) ×4 IMPLANT
SHEET CONFORM 45LX20WX5H (Bone Implant) ×1 IMPLANT
SPONGE GAUZE 4X4 12PLY (GAUZE/BANDAGES/DRESSINGS) ×5 IMPLANT
SPONGE LAP 4X18 X RAY DECT (DISPOSABLE) IMPLANT
SPONGE SURGIFOAM ABS GEL 100 (HEMOSTASIS) ×3 IMPLANT
SPONGE SURGIFOAM ABS GEL SZ50 (HEMOSTASIS) ×2 IMPLANT
STRIP CLOSURE SKIN 1/2X4 (GAUZE/BANDAGES/DRESSINGS) ×5 IMPLANT
SUT PROLENE 6 0 BV (SUTURE) IMPLANT
SUT VIC AB 0 CT1 18XCR BRD8 (SUTURE) ×6 IMPLANT
SUT VIC AB 0 CT1 8-18 (SUTURE) ×6
SUT VIC AB 2-0 CP2 18 (SUTURE) ×8 IMPLANT
SUT VIC AB 3-0 SH 8-18 (SUTURE) ×8 IMPLANT
SYR 20ML ECCENTRIC (SYRINGE) ×5 IMPLANT
SYR 5ML LL (SYRINGE) IMPLANT
SYR CONTROL 10ML LL (SYRINGE) ×1 IMPLANT
TAPE CLOTH SURG 4X10 WHT LF (GAUZE/BANDAGES/DRESSINGS) ×1 IMPLANT
TOWEL OR 17X24 6PK STRL BLUE (TOWEL DISPOSABLE) ×5 IMPLANT
TOWEL OR 17X26 10 PK STRL BLUE (TOWEL DISPOSABLE) ×5 IMPLANT
TRAP SPECIMEN MUCOUS 40CC (MISCELLANEOUS) ×1 IMPLANT
TRAY FOLEY CATH 14FRSI W/METER (CATHETERS) ×3 IMPLANT
WATER STERILE IRR 1000ML POUR (IV SOLUTION) ×5 IMPLANT

## 2013-06-22 NOTE — Anesthesia Postprocedure Evaluation (Signed)
Anesthesia Post Note  Patient: Kathryn [name]  Procedure(s) Performed: Procedure(s) (LRB): LUMBAR FOUR-FIVE POSTERIOR LUMBAR INTERBODY FUSION WITH SABRE CAGES,EXPEDIUM SCREWS (N/A) RIGHT LUMBAR TWO-THREE DISCECTOMY (Right)  Anesthesia type: general  Patient location: PACU  Post pain: Pain level controlled  Post assessment: Patient's Cardiovascular Status Stable  Last Vitals:  Filed Vitals:   06/22/13 1610  BP:   Pulse:   Temp: 36.4 C  Resp:     Post vital signs: Reviewed and stable  Level of consciousness: sedated  Complications: No apparent anesthesia complications

## 2013-06-22 NOTE — H&P (Signed)
See H& P.

## 2013-06-22 NOTE — Interval H&P Note (Signed)
History and Physical Interval Note:  06/22/2013 10:07 AM  Kathryn [name]  has presented today for surgery, with the diagnosis of Spondylolisthesis, Stenosis, Lumbar spondylosis, Lumbar hnp without myelopathy  The various methods of treatment have been discussed with the patient and family. After consideration of risks, benefits and other options for treatment, the patient has consented to  Procedure(s) with comments: POSTERIOR LUMBAR FUSION 1 LEVEL (N/A) - L4-5 posterior lumbar interbody fusion with sabre cages, expedium screws LUMBAR LAMINECTOMY/DECOMPRESSION MICRODISCECTOMY 1 LEVEL (Right) - Right L2-3 Diskectomy as a surgical intervention .  The patient's history has been reviewed, patient examined, no change in status, stable for surgery.  I have reviewed the patient's chart and labs.  Questions were answered to the patient's satisfaction.     Christiann Hagerty R

## 2013-06-22 NOTE — Op Note (Signed)
06/22/2013  1:51 PM  PATIENT:  Kathryn [name]  57 y.o. female  PRE-OPERATIVE DIAGNOSIS:  Spondylolisthesis, Stenosis, Lumbar spondylosis,L4-5    ;  Lumbar hnp without myelopathy right L2-3  POST-OPERATIVE DIAGNOSIS:  Same  PROCEDURE:  Procedure(s): LUMBAR FOUR-FIVE  Decompressive laminectomy decompressing L4 and L5 roots (2 levels) , POSTERIOR LUMBAR INTERBODY FUSION L4-5 ,    Interbody  CAGES L4-5  ,EXPEDIUM nonsegmented pedicle screw fixation L4-5  Autograft, allograft, bone marrow aspirate RIGHT LUMBAR TWO-THREE semi hemi laminectomy and DISCECTOMY, microdisection  SURGEON:  Surgeon(s): Clydene Fake, MD Maeola Harman, MD-assist    ANESTHESIA:   general  EBL:  Total I/O In: 2350 [I.V.:2350] Out: 350 [Urine:325; Blood:25]  BLOOD ADMINISTERED:none  DRAINS: none   SPECIMEN:  No Specimen  DICTATION: Patient with back and bilateral leg pain worse to the right.  X-rays and MRI lumbar spine was done showing introduces the L4 and 5 with lateral recess stenosis right worse than left temporal narrowing due to the anterolisthesis to increase in the amount of anterolisthesis compared to per your prior MRI. Also there is a new right-sided disc herniation L2-3. Patient worsened symptoms improving with the more conservative management was decided to proceed with intervention decompression fusion L4-5 with instrumentation and a right L2-3 lamina discectomy.  Patient brought in the operative general anesthesia induced patient placed in a prone position Wilson frame all pressure points padded. Patient prepped draped sterile fashion segments inject with 20 cc of lidocaine with epinephrine. Needle placed in the interspaces and x-rays attention needle for putting at C4-5 and to 3 spaces. Incision was then made in the midline incision taken the fascia hemostasis obtained cauterization subperiosteal dissection was done over the to 3 spinous process lamina to the facets and bilaterally over the 35  spinous process lamina out to the facets and exposing the transverse process of the 4 and 5. Markers were placed at the to 3 interspace 45 interspace the pedicle entry points for the 4 and 5 the lateral x-rays obtained showing the to this correlated and to markers were at the correct position. Checking retractors were placed we could see the temporal artery and decompressive laminectomy started with Leksell rongeurs. All bone was cleaned from soft tissue chart the small pieces for use later in the case high-speed drill was used to continue the decompression of the laminectomy and then the Kerrison punches. We very carefully decompressed the bilateral L4 and L5 nerve roots. We then explored the epidural space and dispense dispensed incised and discectomy done with pituitary rongeurs and distracted interspace up to 10 mm in the continue removing disc and prepare the interspace for interbody fusion with pituitary rongeurs curettes and scrapers and broaches. We packed into to a number interbody cages with autograft bone we packed the interspace with autograft bone: Distraction and low side and the cage in the right space on the contralateral side removed the distractor then placed the cage in the ipsilateral side. These were in good position we did decompression central canal and bilateral L4 and L5 nerve roots. Attention then taken to the to 3 level of the right side microscope was brought in for microdissection at drill was used to starting semi-hemilaminectomy medial facetectomy is completed with Kerrison punches the ligamentum flavum was removed. Explored the epidural space and found the disc herniation to space incised and discectomy done with pituitary rongeurs and curettes. Were finished we did decompression of the good decompression of the nerve roots. We very hemostasis we. About  solution. Then taken back to the fourth of level where lateral facets transverse processes were decorticated with high-speed drill  using fluoroscopy and intraocular marks 500 point L4 decorticated with high-speed drill placed a probe down the pedicle tapped the pedicle aspirated bone marrow aspirate to placed on and allograft sponge and placed Expedium pedicle screw is. This was repeated bilaterally at the L4 pedicles at the L5 pedicles 50 mm screws were used at L4 and 45 of her screws used to 5. Final AP and lateral fluoroscopic images were done showing good position pedicle screws and interbody cages. Roger placed no screws and the  locking nuts placed and these were final tightened. We then packed rest the autograft bone and the allograft with bone marrow aspirate in the posterolateral gutters bilaterally. We reexplored the nerve root and dura in place at L2-3 and the 45 level it hemostasis good decompression of the canal and nerve roots. Retractors removed fascia closed with 0 Vicryl interrupted sutures subcutaneous tissue closed with oh 2 interrupted sutures skin closed benzoin Steri-Strips dressing was placed patient placed in spine position woken (and transferred recovery.  PLAN OF CARE: Admit to inpatient   PATIENT DISPOSITION:  PACU - hemodynamically stable.

## 2013-06-22 NOTE — Preoperative (Signed)
Beta Blockers   Reason not to administer Beta Blockers:Not Applicable 

## 2013-06-22 NOTE — Transfer of Care (Signed)
Immediate Anesthesia Transfer of Care Note  Patient: Kathryn [name]  Procedure(s) Performed: Procedure(s) with comments: LUMBAR FOUR-FIVE POSTERIOR LUMBAR INTERBODY FUSION WITH SABRE CAGES,EXPEDIUM SCREWS (N/A) RIGHT LUMBAR TWO-THREE DISCECTOMY (Right) - Right   Patient Location: PACU  Anesthesia Type:General  Level of Consciousness: sedated and patient cooperative  Airway & Oxygen Therapy: Patient Spontanous Breathing and Patient connected to nasal cannula oxygen  Post-op Assessment: Report given to PACU RN and Post -op Vital signs reviewed and stable  Post vital signs: Reviewed and stable  Complications: No apparent anesthesia complications

## 2013-06-22 NOTE — Anesthesia Procedure Notes (Signed)
Procedure Name: Intubation Date/Time: 06/22/2013 10:23 AM Performed by: Tyrone Nine Pre-anesthesia Checklist: Patient identified, Timeout performed, Emergency Drugs available, Suction available and Patient being monitored Patient Re-evaluated:Patient Re-evaluated prior to inductionOxygen Delivery Method: Circle system utilized Preoxygenation: Pre-oxygenation with 100% oxygen Intubation Type: IV induction Ventilation: Mask ventilation without difficulty Laryngoscope Size: Mac and 3 Grade View: Grade I Tube type: Oral Tube size: 7.0 mm Number of attempts: 1 Airway Equipment and Method: Stylet Placement Confirmation: ETT inserted through vocal cords under direct vision,  positive ETCO2 and breath sounds checked- equal and bilateral Secured at: 22 cm Tube secured with: Tape Dental Injury: Teeth and Oropharynx as per pre-operative assessment

## 2013-06-22 NOTE — Anesthesia Preprocedure Evaluation (Addendum)
Anesthesia Evaluation  Patient identified by MRN, date of birth, ID band Patient awake    Reviewed: Allergy & Precautions, H&P , NPO status , Patient's Chart, lab work & pertinent test results  History of Anesthesia Complications (+) PONV  Airway Mallampati: I TM Distance: >3 FB Neck ROM: Full    Dental  (+) Dental Advisory Given and Caps   Pulmonary neg pulmonary ROS, neg shortness of breath, former smoker,  07-20-12 Chest x-ray Findings: The heart size and pulmonary vascularity are normal and the lungs are clear except for minimal scarring at the right lung apex, stable.  No acute osseous abnormality.  No effusions.   IMPRESSION: No acute disease.     Pulmonary exam normal       Cardiovascular Exercise Tolerance: Good Rhythm:Regular Rate:Normal     Neuro/Psych Anxiety  Neuromuscular disease    GI/Hepatic hiatal hernia, GERD-  Medicated and Controlled,(+) Hepatitis -, A  Endo/Other  negative endocrine ROS  Renal/GU      Musculoskeletal  (+) Arthritis -, Osteoarthritis,    Abdominal Normal abdominal exam  (+)   Peds  Hematology negative hematology ROS (+)   Anesthesia Other Findings Full mouth of crowns Hx of motion sickness  Reproductive/Obstetrics                        Anesthesia Physical Anesthesia Plan  ASA: II  Anesthesia Plan: General   Post-op Pain Management:    Induction: Intravenous  Airway Management Planned: Oral ETT  Additional Equipment:   Intra-op Plan:   Post-operative Plan: Extubation in OR  Informed Consent: I have reviewed the patients History and Physical, chart, labs and discussed the procedure including the risks, benefits and alternatives for the proposed anesthesia with the patient or authorized representative who has indicated his/her understanding and acceptance.   Dental advisory given  Plan Discussed with: CRNA, Surgeon and  Anesthesiologist  Anesthesia Plan Comments:       Anesthesia Quick Evaluation

## 2013-06-23 MED ORDER — OXYCODONE-ACETAMINOPHEN 5-325 MG PO TABS: 1.0000 | ORAL_TABLET | ORAL | Status: DC | PRN

## 2013-06-23 MED ORDER — CYCLOBENZAPRINE HCL 10 MG PO TABS: 10.0000 mg | ORAL_TABLET | Freq: Three times a day (TID) | ORAL | Status: DC | PRN

## 2013-06-23 NOTE — Evaluation (Signed)
Physical Therapy Evaluation Patient Details Name: Kathryn [name] MRN: [id number] DOB: Jul 26, 1956 Today's Date: 06/23/2013 Time: 4098-1191 PT Time Calculation (min): 20 min  PT Assessment / Plan / Recommendation History of Present Illness  57 y.o. female admitted to [facility] on 06/22/13 with elective L4/5 decompression and PLIF.    Clinical Impression  Pt POD #1 s/p lumbar fusion surgery.  Pt is moving well, has husband's assist at home.  All education completed and pt ready for discharge.      PT Assessment  Patent does not need any further PT services    Follow Up Recommendations  No PT follow up    Does the patient have the potential to tolerate intense rehabilitation     NA  Barriers to Discharge   None      Equipment Recommendations  None recommended by PT    Recommendations for Other Services   None  Frequency   NA- One time eval and d/c   Precautions / Restrictions Precautions Precautions: Back Precaution Booklet Issued: Yes (comment) Precaution Comments: Reviewed back precautions, brace use and functional examples of when she may need to make adjustments to how she does things.  We also reviewed log roll technique and lifting restrictions.   Required Braces or Orthoses: Spinal Brace Spinal Brace: Lumbar corset;Applied in sitting position   Pertinent Vitals/Pain See vitals flow sheet.      Mobility  Bed Mobility Bed Mobility: Rolling Left;Left Sidelying to Sit;Sitting - Scoot to Edge of Bed Rolling Left: 5: Supervision;Other (comment) (HOB flat, no rail to simulate home environment) Left Sidelying to Sit: 6: Modified independent (Device/Increase time);HOB flat (no rails) Sitting - Scoot to Edge of Bed: 6: Modified independent (Device/Increase time) Details for Bed Mobility Assistance: Pt needed some min verbal cues for safe technique using log roll to get to sitting/EOB.   Transfers Transfers: Sit to Stand;Stand to Sit Sit to Stand: 5: Supervision;With upper  extremity assist;From bed Stand to Sit: 5: Supervision;Without upper extremity assist;To bed Details for Transfer Assistance: supervision for safety due to slow, guarded, painful transitions.  Heavy reliance on hands for support during transitions Ambulation/Gait Ambulation/Gait Assistance: 5: Supervision Ambulation Distance (Feet): 250 Feet Assistive device: None Ambulation/Gait Assistance Details: supervision for safety as pt reports fatigue.  Decreased gait speed and guarded posture.  No functional signs of weakness in legs with gait.  Reviewed avoiding twisting while walking and talking with people.  Gait Pattern: Within Functional Limits Gait velocity: decreased Stairs: Yes Stairs Assistance: 4: Min assist Stairs Assistance Details (indicate cue type and reason): min hand held assist provided by husband with therapist demonstrating correct guarding/holding technique.  While on the steps pt determined that she still needed to lead with her left leg, because her right leg still felt weaker.  Verbal cues for step to pattern for safety.   Stair Management Technique: Step to pattern;Forwards;Other (comment) (with hand held assist on her right side. ) Number of Stairs: 9    Exercises Other Exercises Other Exercises: educated re: main form of exercise should be walking, short distances 3 times daily.  She should avoid sitting still and needs to get up and move while awake every 45 min to an hour.  Avoid long car trips and no driving until physician says she can.        PT Goals(Current goals can be found in the care plan section) Acute Rehab PT Goals Patient Stated Goal: to get back to work PT Goal Formulation: No goals set, d/c  therapy  Visit Information  Last PT Received On: 06/23/13 Assistance Needed: +1 History of Present Illness: 57 y.o. female admitted to [facility] on 06/22/13 with elective L4/5 decompression and PLIF.         Prior Functioning  Home Living Family/patient expects to  be discharged to:: Private residence Living Arrangements: Spouse/significant other Available Help at Discharge: Family;Available 24 hours/day Type of Home: House Home Access: Stairs to enter [employer] of Steps: 4 Entrance Stairs-Rails: None Home Layout: One level Home Equipment: Hand held shower head;Shower seat - built in Prior Function Level of Independence: Independent Comments: worked at a [employer].  Pt hopes to return to work after she has recovered from back surgery.   Communication Communication: No difficulties    Cognition  Cognition Arousal/Alertness: Awake/alert Behavior During Therapy: WFL for tasks assessed/performed Overall Cognitive Status: Within Functional Limits for tasks assessed    Extremity/Trunk Assessment Upper Extremity Assessment Upper Extremity Assessment: Defer to OT evaluation Lower Extremity Assessment Lower Extremity Assessment: RLE deficits/detail RLE Deficits / Details: right leg still showing signs of decreased strength.  She reports that this was her weaker and more painful leg prior to surgery.   Cervical / Trunk Assessment Cervical / Trunk Assessment: Normal      End of Session PT - End of Session Equipment Utilized During Treatment: Back brace Activity Tolerance: Patient tolerated treatment well;Patient limited by fatigue;Patient limited by pain Patient left: in bed;with call bell/phone within reach;with family/visitor present (seated EOB eating lunch) Nurse Communication: Mobility status    Lurena Joiner B. Rena Hunke, PT, DPT [phone]   06/23/2013, 1:05 PM

## 2013-06-23 NOTE — Progress Notes (Signed)
14mg  of Morphine was wasted after PCA was D/C'd. Morphine wasted in the sink. Waste was witnessed by Georgiann Cocker, NT - Rema Fendt, RN

## 2013-06-23 NOTE — Progress Notes (Signed)
Pt and husband given D/C instructions with Rx's, verbal understanding given. Pt D/C'd home via walking @ 1335 per MD order. Rema Fendt, RN

## 2013-06-23 NOTE — Discharge Summary (Signed)
Physician Discharge Summary  Patient ID: Kathryn [name] MRN: [id number] DOB/AGE: [birth year] 57 y.o.  Admit date: 06/22/2013 Discharge date: 06/23/2013  Admission Diagnoses:Spondylolisthesis, Stenosis, Lumbar spondylosis,L4-5 ; Lumbar hnp without myelopathy right L2-3      Discharge Diagnoses: Spondylolisthesis, Stenosis, Lumbar spondylosis,L4-5 ; Lumbar hnp without myelopathy right L2-3     Active Problems:   * No active hospital problems. *   Discharged Condition: good  Hospital Course: pt admitted on day of surgery  - underwent procedure below  - pt doing well  - ambulating, voiding, taking po well  - less leg pain and mild/mod incis soreness   Consults: None    Treatments: surgery: LUMBAR FOUR-FIVE Decompressive laminectomy decompressing L4 and L5 roots (2 levels) , POSTERIOR LUMBAR INTERBODY FUSION L4-5 , Interbody CAGES L4-5 ,EXPEDIUM nonsegmented pedicle screw fixation L4-5 Autograft, allograft, bone marrow aspirate  RIGHT LUMBAR TWO-THREE semi hemi laminectomy and DISCECTOMY, microdisection   Discharge Exam: Blood pressure 105/66, pulse 67, temperature 97.8 F (36.6 C), temperature source Oral, resp. rate 16, height 5' 7.5" (1.715 m), weight 62.999 kg (138 lb 14.2 oz), SpO2 97.00%. Wound:c/d/i  Disposition: home     Medication List    STOP taking these medications       ibuprofen 200 MG tablet  Commonly known as:  ADVIL,MOTRIN     meloxicam 7.5 MG tablet  Commonly known as:  MOBIC      TAKE these medications       ALPRAZolam 0.5 MG tablet  Commonly known as:  XANAX  Take 0.5 mg by mouth as needed for anxiety.     cholecalciferol 1000 UNITS tablet  Commonly known as:  VITAMIN D  Take 1,000 Units by mouth daily.     cyclobenzaprine 10 MG tablet  Commonly known as:  FLEXERIL  Take 1 tablet (10 mg total) by mouth 3 (three) times daily as needed for muscle spasms.     hydroxypropyl methylcellulose 2.5 % ophthalmic solution  Commonly known as:   ISOPTO TEARS  Place 2 drops into both eyes 3 (three) times daily as needed (for dry eyes/ allergies).     loratadine 10 MG tablet  Commonly known as:  CLARITIN  Take 10 mg by mouth daily.     oxyCODONE-acetaminophen 5-325 MG per tablet  Commonly known as:  PERCOCET/ROXICET  Take 1-2 tablets by mouth every 4 (four) hours as needed for pain.     pantoprazole 40 MG tablet  Commonly known as:  PROTONIX  Take 40 mg by mouth daily.     pramipexole 0.125 MG tablet  Commonly known as:  MIRAPEX  Take 0.125 mg by mouth at bedtime.     pseudoephedrine 30 MG tablet  Commonly known as:  SUDAFED  Take 30 mg by mouth every 4 (four) hours as needed for congestion.     pyridOXINE 100 MG tablet  Commonly known as:  VITAMIN B-6  Take 100 mg by mouth daily.     traMADol 50 MG tablet  Commonly known as:  ULTRAM  Take 100 mg by mouth every 6 (six) hours as needed for pain.     valACYclovir 500 MG tablet  Commonly known as:  VALTREX  Take 500 mg by mouth daily.     vitamin B-12 100 MCG tablet  Commonly known as:  CYANOCOBALAMIN  Take 100 mcg by mouth daily.         SignedClydene Fake, MD 06/23/2013, 8:52 AM

## 2013-06-23 NOTE — Progress Notes (Signed)
Utilization review complete. Bane Hagy RN CCM Case Mgmt  

## 2013-06-26 ENCOUNTER — Encounter (HOSPITAL_COMMUNITY): Payer: Self-pay | Admitting: Neurosurgery

## 2013-06-26 MED FILL — Heparin Sodium (Porcine) Inj 1000 Unit/ML: INTRAMUSCULAR | Qty: 30 | Status: AC

## 2013-06-26 MED FILL — Sodium Chloride IV Soln 0.9%: INTRAVENOUS | Qty: 1000 | Status: AC

## 2014-05-15 ENCOUNTER — Other Ambulatory Visit (HOSPITAL_COMMUNITY): Payer: Self-pay | Admitting: Family Medicine

## 2014-05-15 DIAGNOSIS — Z139 Encounter for screening, unspecified: Secondary | ICD-10-CM

## 2014-05-16 ENCOUNTER — Ambulatory Visit (HOSPITAL_COMMUNITY)
Admission: RE | Admit: 2014-05-16 | Discharge: 2014-05-16 | Disposition: A | Discharge status: Home / Self Care | Payer: BC Managed Care – PPO | Source: Ambulatory Visit | Attending: Family Medicine | Admitting: Family Medicine

## 2014-05-16 DIAGNOSIS — R928 Other abnormal and inconclusive findings on diagnostic imaging of breast: Secondary | ICD-10-CM | POA: Insufficient documentation

## 2014-05-16 DIAGNOSIS — Z139 Encounter for screening, unspecified: Secondary | ICD-10-CM

## 2014-05-16 DIAGNOSIS — Z1231 Encounter for screening mammogram for malignant neoplasm of breast: Secondary | ICD-10-CM | POA: Diagnosis present

## 2014-05-18 ENCOUNTER — Other Ambulatory Visit: Payer: Self-pay | Admitting: Family Medicine

## 2014-05-18 DIAGNOSIS — R928 Other abnormal and inconclusive findings on diagnostic imaging of breast: Secondary | ICD-10-CM

## 2014-05-21 ENCOUNTER — Ambulatory Visit (HOSPITAL_COMMUNITY): Payer: BC Managed Care – PPO

## 2014-06-12 ENCOUNTER — Other Ambulatory Visit: Payer: Self-pay | Admitting: Family Medicine

## 2014-06-12 ENCOUNTER — Ambulatory Visit (HOSPITAL_COMMUNITY)
Admission: RE | Admit: 2014-06-12 | Discharge: 2014-06-12 | Disposition: A | Discharge status: Home / Self Care | Payer: BC Managed Care – PPO | Source: Ambulatory Visit | Attending: Family Medicine | Admitting: Family Medicine

## 2014-06-12 ENCOUNTER — Encounter (HOSPITAL_COMMUNITY): Payer: BC Managed Care – PPO

## 2014-06-12 DIAGNOSIS — N6489 Other specified disorders of breast: Secondary | ICD-10-CM

## 2014-06-12 DIAGNOSIS — R928 Other abnormal and inconclusive findings on diagnostic imaging of breast: Secondary | ICD-10-CM

## 2014-06-15 ENCOUNTER — Ambulatory Visit
Admission: RE | Admit: 2014-06-15 | Discharge: 2014-06-15 | Disposition: A | Discharge status: Home / Self Care | Payer: BC Managed Care – PPO | Source: Ambulatory Visit | Attending: Family Medicine | Admitting: Family Medicine

## 2014-06-15 DIAGNOSIS — R928 Other abnormal and inconclusive findings on diagnostic imaging of breast: Secondary | ICD-10-CM

## 2014-06-15 DIAGNOSIS — N6489 Other specified disorders of breast: Secondary | ICD-10-CM

## 2014-07-18 ENCOUNTER — Encounter (INDEPENDENT_AMBULATORY_CARE_PROVIDER_SITE_OTHER): Payer: Self-pay | Admitting: *Deleted

## 2014-07-28 IMAGING — DX DG LUMBAR SPINE 2-3V
1 series · 1 of 1 positions shown · non-contrast
Comparison: Lumbar MRI dated 02/11/2009

CLINICAL DATA: Degenerative facet arthritis in the lumbar spine.

EXAM:
LUMBAR SPINE - 2-3 VIEW

[lat]
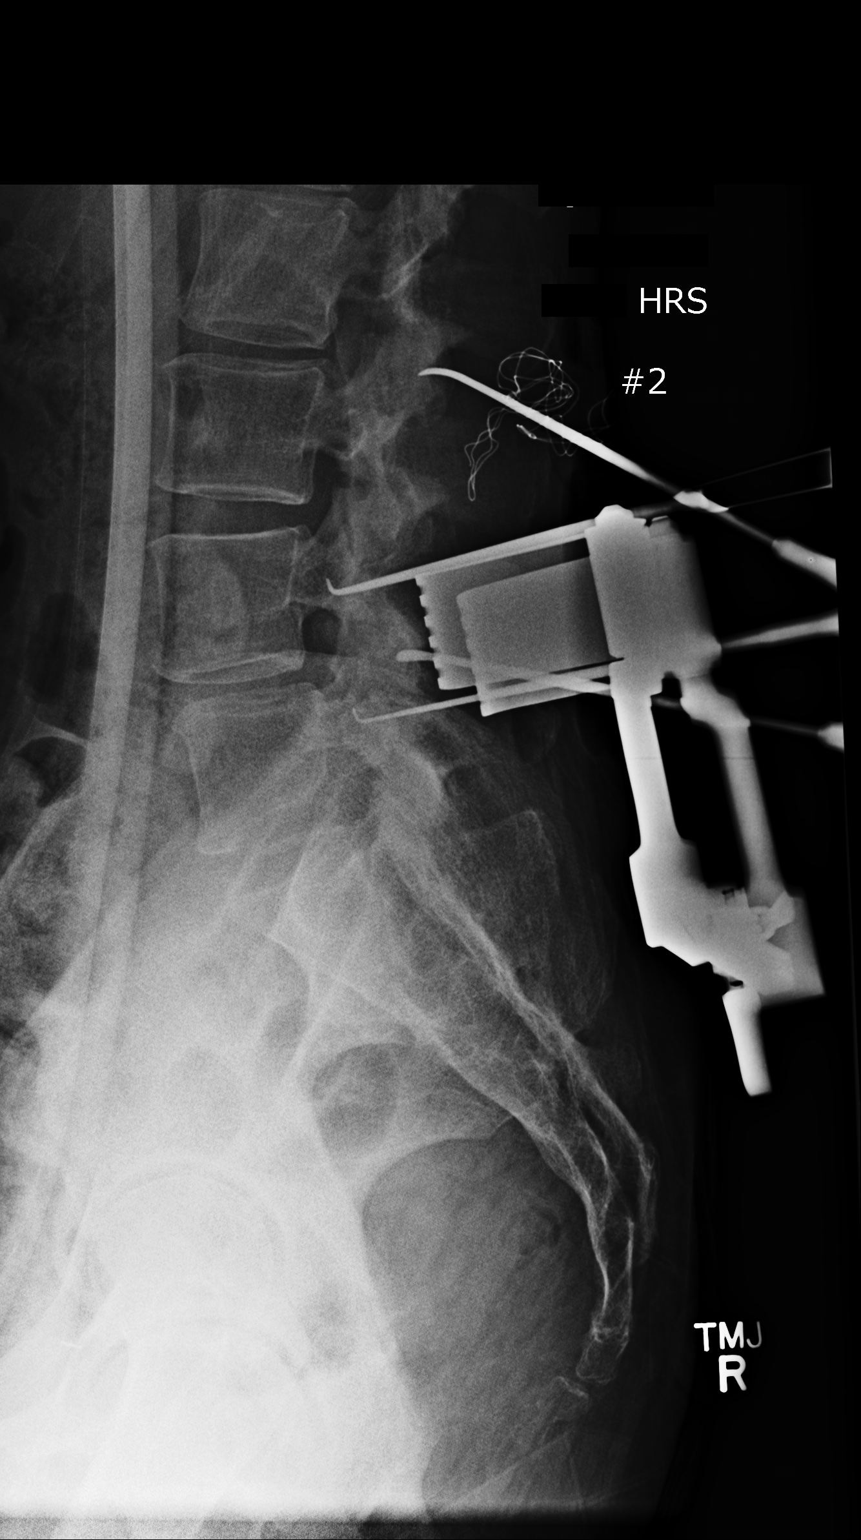

[1 of 1 positions shown; findings below may reference images not displayed]

FINDINGS: Radiograph #1 demonstrates a needle at the L2-3 level and another
needle at the L4-5 level.

Radiograph #2 demonstrates instruments at the L4-5 level. There is
also one instrument at the L2-3 level.
IMPRESSION: Instruments at L4-5.

## 2014-08-08 ENCOUNTER — Ambulatory Visit (INDEPENDENT_AMBULATORY_CARE_PROVIDER_SITE_OTHER): Payer: BC Managed Care – PPO | Admitting: Internal Medicine

## 2014-08-13 ENCOUNTER — Ambulatory Visit (HOSPITAL_COMMUNITY)
Admission: RE | Admit: 2014-08-13 | Discharge: 2014-08-13 | Disposition: A | Discharge status: Home / Self Care | Payer: BC Managed Care – PPO | Source: Ambulatory Visit | Attending: Neurosurgery | Admitting: Neurosurgery

## 2014-08-13 DIAGNOSIS — M2560 Stiffness of unspecified joint, not elsewhere classified: Secondary | ICD-10-CM | POA: Diagnosis not present

## 2014-08-13 DIAGNOSIS — G8929 Other chronic pain: Secondary | ICD-10-CM | POA: Insufficient documentation

## 2014-08-13 DIAGNOSIS — M5410 Radiculopathy, site unspecified: Secondary | ICD-10-CM | POA: Insufficient documentation

## 2014-08-13 DIAGNOSIS — M5416 Radiculopathy, lumbar region: Secondary | ICD-10-CM

## 2014-08-13 NOTE — Therapy (Addendum)
[facility] [address] [place], [place], [address] Phone: [phone]   Fax:  [phone]  Physical Therapy Evaluation  Patient Details  Name: Kathryn [name] MRN: [id number] Date of Birth: Nov 20, 1955  Encounter Date: 08/13/2014      PT End of Session - 08/13/14 1623    Visit Number 1   Number of Visits 4   Date for PT Re-Evaluation 09/12/14   Authorization Type BCBS   PT Start Time 1345   PT Stop Time 1430   PT Time Calculation (min) 45 min      Past Medical History  Diagnosis Date  . Chest pain, atypical     had cardiac workup - deemed related to anxiety  . Dyspnea   . Anxiety   . PONV (postoperative nausea and vomiting)   . GERD (gastroesophageal reflux disease)   . H/O hiatal hernia   . H/O dizziness   . Arthritis   . Hepatitis     hepatitis A as a child  . Guillain-Barre syndrome following vaccination   . Herpes simplex     Past Surgical History  Procedure Laterality Date  . Anterior cervical decomp/discectomy fusion      C5 and C6, Dr. Otilio Connors, MD  . Cholecystectomy    . Appendectomy    . Abdominal hysterectomy    . Rotator cuff repair Right   . Lumbar laminectomy/decompression microdiscectomy Right 06/22/2013    Procedure: RIGHT LUMBAR TWO-THREE DISCECTOMY;  Surgeon: Otilio Connors, MD;  Location: [facility];  Service: Neurosurgery;  Laterality: Right;  Right     There were no vitals taken for this visit.  Visit Diagnosis:  Chronic radicular low back pain  Stiffness in joint      Subjective Assessment - 08/13/14 1400    Symptoms Kathryn [name] had back surgery about a year ago but her pain has not gone away.  She continues to have pain going down both legs especially in her Rt leg.  She initially had therapy at a different facility but is no longer completing those exercises .     How long can you sit comfortably? after an hour she needs to get up    How long can you stand comfortably? able to stand for a  half hour    How long can you walk comfortably? better pt still hurts but it's better than sitting    Currently in Pain? Yes   Pain Score 4   highest it goes is a 8/10   Pain Location Back   Pain Orientation Lower   Pain Radiating Towards hips and knees B   Aggravating Factors  sitting   Pain Relieving Factors lying down          [facility] PT Assessment - 08/13/14 1404    Assessment   Medical Diagnosis Lumbar radiculopathy    Onset Date 06/11/13   Prior Therapy after surgery had OP surgery.     Precautions   Precautions Back   Prior Function   Level of Independence Independent with basic ADLs   Vocation Full time employment   Vocation Requirements ball   Leisure none    AROM   Lumbar Flexion decreased 30% with reps increasing pain    Lumbar Extension wnl   Lumbar - Right Side Bend wnl   Lumbar - Left Side Bend wnl   Lumbar - Right Rotation wnl   Lumbar - Left Rotation wnl   Strength   Right Hip Flexion  5/5   Left Hip Flexion --  5-/5   Left Hip ABduction 5/5   Right Knee Flexion 5/5   Right Knee Extension 5/5   Left Knee Flexion 5/5   Left Knee Extension 5/5   Right Ankle Dorsiflexion 4/5   Left Ankle Dorsiflexion 4/5   Flexibility   Soft Tissue Assessment /Muscle Lenght yes   Hamstrings Rt 150; Lt 140   Special Tests    Special Tests --  tight paraspinal mm          [facility] Adult PT Treatment/Exercise - 08/13/14 0001    Exercises   Exercises Lumbar   Lumbar Exercises: Stretches   Active Hamstring Stretch 3 reps;30 seconds   Single Knee to Chest Stretch 3 reps;30 seconds   Piriformis Stretch 1 rep;60 seconds   Piriformis Stretch Limitations all four    Lumbar Exercises: Supine   Ab Set 10 reps            PT Short Term Goals - 08/13/14 1629    PT SHORT TERM GOAL #1   Title I in HEP   Time 2   Period Weeks   PT SHORT TERM GOAL #2   Title Pt radicular pain to be no further than her mid thigh   Time 2   Period Weeks   PT SHORT TERM GOAL #3    Title Pain level to be no greater than a 5/10 80% of the day    Time 2   Period Weeks   PT SHORT TERM GOAL #4   Title Pt to be able to verbalize how important body mechanics are in the control of low back pain   Time 2   Period Weeks          PT Long Term Goals - 08/13/14 1631    PT LONG TERM GOAL #1   Title I in advance HEP   Time 4   Period Weeks   PT LONG TERM GOAL #2   Title Pt pain to be no greater than a 2/10 80% of the time   Time 4   Period Weeks   PT LONG TERM GOAL #3   Title Pt trunk strength to be improved to 4/5    Time 4   Period Weeks          Plan - 08/13/14 1624    Clinical Impression Statement Pt is a 58 yo who had back surgery a year ago and is still having pain down B LE.  She has been referred to PT for skilled care to decrease her sx of pain.  Pt exam demonstrates decreased trunk mm, tight trunk and LE mm as well as increased pain. She will benefit from skilled therapy to address these issues.  Kathryn [name] states due to work schedule she is only able to come to therapy one time a week.    Pt will benefit from skilled therapeutic intervention in order to improve on the following deficits Decreased strength;Pain;Decreased range of motion;Impaired flexibility;Improper body mechanics   Rehab Potential Good   PT Frequency 1x / week   PT Duration 4 weeks   PT Treatment/Interventions ADLs/Self Care Home Management;Therapeutic exercise;Therapeutic activities   PT Next Visit Plan beging sitting thoracic matrix; standing 3-D hip excursions, quad and ITB stretching manual to low back to reduce fascial restrictions.    PT Home Exercise Plan given    Consulted and Agree with Plan of Care Patient  Problem List Patient Active Problem List   Diagnosis Date Noted  . DYSPNEA 12/13/2009  . CHEST PAIN, ATYPICAL 12/13/2009  Azucena Freed PT/CLT [phone]  08/13/2014, 4:34 PM  PHYSICAL THERAPY DISCHARGE  SUMMARY  Visits from Start of Care: 1  Current functional level related to goals / functional outcomes: same   Remaining deficits: same   Education / Equipment: HEP  Plan: Patient agrees to discharge.  Patient goals were not met. Patient is being discharged due to not returning since the last visit.  ?????       Rayetta Humphrey, [facility] CLT [phone]

## 2014-08-13 NOTE — Patient Instructions (Signed)
Backward Bend (Standing)   Arch backward to make hollow of back deeper. Hold ____ seconds. Repeat ____ times per set. Do ____ sets per session. Do ____ sessions per day.  http://orth.exer.us/178   Copyright  VHI. All rights reserved.  Knee-to-Chest Stretch: Unilateral   With hand behind right knee, pull knee in to chest until a comfortable stretch is felt in lower back and buttocks. Keep back relaxed. Hold ____ seconds. Repeat ____ times per set. Do ____ sets per session. Do ____ sessions per day.  http://orth.exer.us/126   Copyright  VHI. All rights reserved.  Lower Trunk Rotation Stretch   Keeping back flat and feet together, rotate knees to left side. Hold ____ seconds. Repeat ____ times per set. Do ____ sets per session. Do ____ sessions per day.  http://orth.exer.us/122   Copyright  VHI. All rights reserved.  Hamstring Stretch: Active   Support behind right knee. Starting with knee bent, attempt to straighten knee until a comfortable stretch is felt in back of thigh. Hold ____ seconds. Repeat ____ times per set. Do ____ sets per session. Do ____ sessions per day.  http://orth.exer.us/158   Copyright  VHI. All rights reserved.  Isometric Abdominal   Lying on back with knees bent, tighten stomach by pressing elbows down. Hold ____ seconds. Repeat ____ times per set. Do ____ sets per session. Do ____ sessions per day.  http://orth.exer.us/1086   Copyright  VHI. All rights reserved.  Bent Leg Lift (Hook-Lying)   Tighten stomach and slowly raise right leg ____ inches from floor. Keep trunk rigid. Hold ____ seconds. Repeat ____ times per set. Do ____ sets per session. Do ____ sessions per day.  http://orth.exer.us/1090   Copyright  VHI. All rights reserved.  Bridging   Slowly raise buttocks from floor, keeping stomach tight. Repeat ____ times per set. Do ____ sets per session. Do ____ sessions per day.  http://orth.exer.us/1096   Copyright  VHI. All  rights reserved.  Heel Squeeze (Prone)   Abdomen supported, bend knees and gently squeeze heels together. Hold ____ seconds. Repeat ____ times per set. Do ____ sets per session. Do ____ sessions per day.  http://orth.exer.us/1080   Copyright  VHI. All rights reserved.  Straight Leg Raise (Prone)   Abdomen and head supported, keep left knee locked and raise leg at hip. Avoid arching low back. Repeat ____ times per set. Do ____ sets per session. Do ____ sessions per day.  http://orth.exer.us/1112   Copyright  VHI. All rights reserved.  On Elbows (Prone)   Rise up on elbows as high as possible, keeping hips on floor. Hold ____ seconds. Repeat ____ times per set. Do ____ sets per session. Do ____ sessions per day.   http://orth.exer.us/92   Copyright  VHI. All rights reserved. Piriformis Stretch (All-Fours)   With right leg crossed in front, slide other leg back, lowering hips until stretch is felt. Repeat ____ times per set. Do ____ sets per session. Do ____ sessions per day.  http://orth.exer.us/292   Copyright  VHI. All rights reserved.

## 2014-08-21 ENCOUNTER — Ambulatory Visit (INDEPENDENT_AMBULATORY_CARE_PROVIDER_SITE_OTHER): Payer: BC Managed Care – PPO | Admitting: Internal Medicine

## 2014-08-21 ENCOUNTER — Other Ambulatory Visit (INDEPENDENT_AMBULATORY_CARE_PROVIDER_SITE_OTHER): Payer: Self-pay | Admitting: *Deleted

## 2014-08-21 ENCOUNTER — Telehealth (INDEPENDENT_AMBULATORY_CARE_PROVIDER_SITE_OTHER): Payer: Self-pay | Admitting: *Deleted

## 2014-08-21 ENCOUNTER — Encounter (INDEPENDENT_AMBULATORY_CARE_PROVIDER_SITE_OTHER): Payer: Self-pay | Admitting: Internal Medicine

## 2014-08-21 ENCOUNTER — Encounter (INDEPENDENT_AMBULATORY_CARE_PROVIDER_SITE_OTHER): Payer: Self-pay

## 2014-08-21 VITALS — BP 92/58 | HR 80 | Temp 97.8°F | Ht 67.8 in | Wt 131.5 lb

## 2014-08-21 DIAGNOSIS — R1314 Dysphagia, pharyngoesophageal phase: Secondary | ICD-10-CM

## 2014-08-21 DIAGNOSIS — R1310 Dysphagia, unspecified: Secondary | ICD-10-CM

## 2014-08-21 DIAGNOSIS — R195 Other fecal abnormalities: Secondary | ICD-10-CM

## 2014-08-21 DIAGNOSIS — K589 Irritable bowel syndrome without diarrhea: Secondary | ICD-10-CM

## 2014-08-21 DIAGNOSIS — Z1211 Encounter for screening for malignant neoplasm of colon: Secondary | ICD-10-CM

## 2014-08-21 MED ORDER — DICYCLOMINE HCL 10 MG PO CAPS: 10.0000 mg | ORAL_CAPSULE | Freq: Two times a day (BID) | ORAL | Status: DC

## 2014-08-21 NOTE — Progress Notes (Signed)
Subjective:    Patient ID: Kathryn [name], female    DOB: [birth year], 58 y.o.   MRN: [id number]  HPI Referred to our office by Micheline Rough [facility]) for irritable bowel.  She tells me she is having frequent diarrhea. She has a lot of cramping in her lower abdomen.  She says when she stand on her feet all day she has swelling to her rt lower quadrant.  She is having diarrhea off and on. She has had diarrhea off an on for about a year. She has a lot of mucous in her stools. She denies seeing any blood in her stools or melena.  She usually has two-three stools a day. She says sometimes she doesn't feel like she completely empty out. Sometimes her stools are slim and then sometimes her stools are normal. She says she is alternating between constipation and diarrhea. Has been seen at [facility] in [place] and was told she had IBS.  Her last colonoscopy was about 6 yrs ago she says by Dr. Laural Golden (2009) and he two  removed a polyp. (Screening). Biopsy: Hyperplastic polyp. She was told repeat in 10 yrs.  She also c/o of acid reflux. She says she has acid reflux on a daily basis. She says sometimes the acid will run up into her esophagus.  She takes the Protonix once a day. She tells me foods are lodging in her esophagus. Symptoms for several years. She had an EGD/ED years ago by Dr Laural Golden.  Meats are lodging in her esophagus. If foods are wet, she does not have a problem. She says sometimes she feels as if  the muscles in her esophagus are not as strong.  If foods are dry, they will lodge.  Family hx of colon cancer in grandfather in his 68 or 4s.       Review of Systems Married. One child in good health. Works at [employer].     Past Medical History  Diagnosis Date  . Chest pain, atypical     had cardiac workup - deemed related to anxiety  . Dyspnea   . Anxiety   . PONV (postoperative nausea and vomiting)   . GERD (gastroesophageal reflux disease)   . H/O hiatal hernia   . H/O  dizziness   . Arthritis   . Hepatitis     hepatitis A as a child  . Guillain-Barre syndrome following vaccination   . Herpes simplex     Past Surgical History  Procedure Laterality Date  . Anterior cervical decomp/discectomy fusion      C5 and C6, Dr. Otilio Connors, MD 2015  . Cholecystectomy    . Appendectomy    . Abdominal hysterectomy    . Rotator cuff repair Right   . Lumbar laminectomy/decompression microdiscectomy Right 06/22/2013    Procedure: RIGHT LUMBAR TWO-THREE DISCECTOMY;  Surgeon: Otilio Connors, MD;  Location: [facility];  Service: Neurosurgery;  Laterality: Right;  Right     Allergies  Allergen Reactions  . Niacin And Related Shortness Of Breath and Swelling    Neck; skin hot  . Codeine Swelling    tongue    Current Outpatient Prescriptions on File Prior to Visit  Medication Sig Dispense Refill  . ALPRAZolam (XANAX) 0.5 MG tablet Take 0.5 mg by mouth as needed for anxiety.     . cholecalciferol (VITAMIN D) 1000 UNITS tablet Take 1,000 Units by mouth daily.    . hydroxypropyl methylcellulose (ISOPTO TEARS) 2.5 % ophthalmic solution Place  2 drops into both eyes 3 (three) times daily as needed (for dry eyes/ allergies).    . loratadine (CLARITIN) 10 MG tablet Take 10 mg by mouth daily.    . pantoprazole (PROTONIX) 40 MG tablet Take 40 mg by mouth daily.      . valACYclovir (VALTREX) 500 MG tablet Take 500 mg by mouth daily.       No current facility-administered medications on file prior to visit.        Objective:   Physical Exam  Filed Vitals:   08/21/14 1014  Height: 5' 7.8" (1.722 m)  Weight: 131 lb 8 oz (59.648 kg)       Allergies  Allergen Reactions  . Niacin And Related Shortness Of Breath and Swelling    Neck; skin hot  . Codeine Swelling    tongue   Alert and oriented. Skin warm and dry. Oral mucosa is moist.   . Sclera anicteric, conjunctivae is pink. Thyroid not enlarged. No cervical lymphadenopathy. Lungs clear. Heart regular  rate and rhythm.  Abdomen is soft. Bowel sounds are positive. No hepatomegaly. No abdominal masses felt. No tenderness.  No edema to lower extremities.             Assessment & Plan:  GERD, not controlled.  Protonix to BID. Keep HOB elevated. IBS/ change in stools. I am going to try her on Dicyclomine BID to see if this helps. I thinks she needs a colonoscopy for change in her stools. I am going to try to locate her last colonoscopy and EGD I thinks she needs an EGD/ED for dysphagia Protonix 40mg  30 minutes before breakfast and 30 minutes before supper.

## 2014-08-21 NOTE — Telephone Encounter (Signed)
Patient needs osmo pill prep 

## 2014-08-21 NOTE — Patient Instructions (Addendum)
EGD/ED, Colonoscopy. -The risks and benefits such as perforation, bleeding, and infection were reviewed with the patient and is agreeable. 

## 2014-08-24 MED ORDER — SOD PHOS MONO-SOD PHOS DIBASIC 1.102-0.398 G PO TABS: 1.0000 | ORAL_TABLET | Freq: Once | ORAL | Status: DC

## 2014-09-20 ENCOUNTER — Encounter (HOSPITAL_COMMUNITY)
Admission: RE | Disposition: A | Discharge status: Home / Self Care | Payer: Self-pay | Source: Ambulatory Visit | Attending: Internal Medicine

## 2014-09-20 ENCOUNTER — Encounter (HOSPITAL_COMMUNITY): Payer: Self-pay | Admitting: *Deleted

## 2014-09-20 ENCOUNTER — Ambulatory Visit (HOSPITAL_COMMUNITY)
Admission: RE | Admit: 2014-09-20 | Discharge: 2014-09-20 | Disposition: A | Discharge status: Home / Self Care | Payer: BLUE CROSS/BLUE SHIELD | Source: Ambulatory Visit | Attending: Internal Medicine | Admitting: Internal Medicine

## 2014-09-20 DIAGNOSIS — R197 Diarrhea, unspecified: Secondary | ICD-10-CM

## 2014-09-20 DIAGNOSIS — K449 Diaphragmatic hernia without obstruction or gangrene: Secondary | ICD-10-CM | POA: Insufficient documentation

## 2014-09-20 DIAGNOSIS — Z79899 Other long term (current) drug therapy: Secondary | ICD-10-CM | POA: Diagnosis not present

## 2014-09-20 DIAGNOSIS — M1990 Unspecified osteoarthritis, unspecified site: Secondary | ICD-10-CM | POA: Diagnosis not present

## 2014-09-20 DIAGNOSIS — Z888 Allergy status to other drugs, medicaments and biological substances status: Secondary | ICD-10-CM | POA: Insufficient documentation

## 2014-09-20 DIAGNOSIS — D120 Benign neoplasm of cecum: Secondary | ICD-10-CM

## 2014-09-20 DIAGNOSIS — Z885 Allergy status to narcotic agent status: Secondary | ICD-10-CM | POA: Diagnosis not present

## 2014-09-20 DIAGNOSIS — R1030 Lower abdominal pain, unspecified: Secondary | ICD-10-CM | POA: Diagnosis not present

## 2014-09-20 DIAGNOSIS — F419 Anxiety disorder, unspecified: Secondary | ICD-10-CM | POA: Insufficient documentation

## 2014-09-20 DIAGNOSIS — Z87891 Personal history of nicotine dependence: Secondary | ICD-10-CM | POA: Diagnosis not present

## 2014-09-20 DIAGNOSIS — K219 Gastro-esophageal reflux disease without esophagitis: Secondary | ICD-10-CM | POA: Diagnosis present

## 2014-09-20 DIAGNOSIS — K2960 Other gastritis without bleeding: Secondary | ICD-10-CM | POA: Diagnosis not present

## 2014-09-20 DIAGNOSIS — K635 Polyp of colon: Secondary | ICD-10-CM | POA: Diagnosis not present

## 2014-09-20 DIAGNOSIS — R195 Other fecal abnormalities: Secondary | ICD-10-CM

## 2014-09-20 DIAGNOSIS — K2970 Gastritis, unspecified, without bleeding: Secondary | ICD-10-CM

## 2014-09-20 DIAGNOSIS — K319 Disease of stomach and duodenum, unspecified: Secondary | ICD-10-CM | POA: Diagnosis not present

## 2014-09-20 DIAGNOSIS — R109 Unspecified abdominal pain: Secondary | ICD-10-CM

## 2014-09-20 DIAGNOSIS — R1310 Dysphagia, unspecified: Secondary | ICD-10-CM

## 2014-09-20 HISTORY — PX: ESOPHAGOGASTRODUODENOSCOPY: SHX5428

## 2014-09-20 HISTORY — PX: MALONEY DILATION: SHX5535

## 2014-09-20 HISTORY — PX: COLONOSCOPY: SHX5424

## 2014-09-20 SURGERY — COLONOSCOPY
Anesthesia: Moderate Sedation

## 2014-09-20 MED ORDER — SODIUM CHLORIDE 0.9 % IV SOLN
INTRAVENOUS | Status: DC
  Administered 2014-09-20: 1000 mL via INTRAVENOUS

## 2014-09-20 MED ORDER — BUTAMBEN-TETRACAINE-BENZOCAINE 2-2-14 % EX AERO
INHALATION_SPRAY | CUTANEOUS | Status: DC | PRN
  Administered 2014-09-20: 2 via TOPICAL

## 2014-09-20 MED ORDER — SIMETHICONE 40 MG/0.6ML PO SUSP
ORAL | Status: DC | PRN
  Administered 2014-09-20: 14:00:00

## 2014-09-20 MED ORDER — MIDAZOLAM HCL 5 MG/5ML IJ SOLN
INTRAMUSCULAR | Status: DC | PRN
  Administered 2014-09-20: 1 mg via INTRAVENOUS
  Administered 2014-09-20: 2 mg via INTRAVENOUS
  Administered 2014-09-20 (×3): 1 mg via INTRAVENOUS
  Administered 2014-09-20: 2 mg via INTRAVENOUS
  Administered 2014-09-20 (×2): 1 mg via INTRAVENOUS

## 2014-09-20 MED ORDER — MIDAZOLAM HCL 5 MG/5ML IJ SOLN
INTRAMUSCULAR | Status: AC
  Filled 2014-09-20: qty 10

## 2014-09-20 MED ORDER — MEPERIDINE HCL 50 MG/ML IJ SOLN
INTRAMUSCULAR | Status: DC | PRN
  Administered 2014-09-20 (×2): 25 mg via INTRAVENOUS

## 2014-09-20 MED ORDER — MEPERIDINE HCL 50 MG/ML IJ SOLN
INTRAMUSCULAR | Status: AC
  Filled 2014-09-20: qty 1

## 2014-09-20 NOTE — H&P (Signed)
Kathryn [name] is an 59 y.o. female.   Chief Complaint: Patient is here for EGD, ED and colonoscopy. HPI: Patient is 59 year old Caucasian female who presents with 6 months to year history of solid food dysphagia. She points to suprasternal area soft bolus obstruction. For eventually passes down. She does not have difficulty with liquids. She says heartburns well controlled with therapy. She denies nausea or vomiting. She also complains of frequent loose stools and lower abdominal pain. She used to be constipated but not anymore. She denies rectal bleeding. Last colonoscopy was over 6 years ago with removal of 2 small polyps but these are hyperplastic. Family history is positive for CRC in grandfather who was in his 85s at the time of diagnosis.    Past Medical History  Diagnosis Date  . Chest pain, atypical     had cardiac workup - deemed related to anxiety  . Dyspnea   . Anxiety   . PONV (postoperative nausea and vomiting)   . GERD (gastroesophageal reflux disease)   . H/O hiatal hernia   . H/O dizziness   . Arthritis   . Hepatitis     hepatitis A as a child  . Guillain-Barre syndrome following vaccination   . Herpes simplex     Past Surgical History  Procedure Laterality Date  . Anterior cervical decomp/discectomy fusion      C5 and C6, Dr. Otilio Connors, MD 2015  . Cholecystectomy    . Appendectomy    . Abdominal hysterectomy    . Rotator cuff repair Right   . Lumbar laminectomy/decompression microdiscectomy Right 06/22/2013    Procedure: RIGHT LUMBAR TWO-THREE DISCECTOMY;  Surgeon: Otilio Connors, MD;  Location: [facility];  Service: Neurosurgery;  Laterality: Right;  Right     History reviewed. No pertinent family history. Social History:  reports that she has quit smoking. She does not have any smokeless tobacco history on file. She reports that she does not drink alcohol or use illicit drugs.  Allergies:  Allergies  Allergen Reactions  . Niacin And Related  Shortness Of Breath and Swelling    Neck; skin hot  . Codeine Swelling    tongue    Medications Prior to Admission  Medication Sig Dispense Refill  . ALPRAZolam (XANAX) 0.5 MG tablet Take 0.5 mg by mouth as needed for anxiety.     . Biotin 5000 MCG CAPS Take 1 capsule by mouth 2 (two) times daily.     . cholecalciferol (VITAMIN D) 1000 UNITS tablet Take 2,000 Units by mouth daily.     . diclofenac (VOLTAREN) 75 MG EC tablet Take 75 mg by mouth 2 (two) times daily.    Marland Kitchen dicyclomine (BENTYL) 10 MG capsule Take 1 capsule (10 mg total) by mouth 2 (two) times daily. 60 capsule 3  . hydroxypropyl methylcellulose (ISOPTO TEARS) 2.5 % ophthalmic solution Place 2 drops into both eyes 3 (three) times daily as needed (for dry eyes/ allergies).    . loratadine (CLARITIN) 10 MG tablet Take 10 mg by mouth at bedtime.     . magnesium oxide (MAG-OX) 400 MG tablet Take 500 mg by mouth every other day.    . pantoprazole (PROTONIX) 40 MG tablet Take 40 mg by mouth 2 (two) times daily.     . pseudoephedrine-guaifenesin (MUCINEX D) 60-600 MG per tablet Take 2 tablets by mouth daily.    . sodium phosphates (OSMOPREP) 1.102-0.398 G TABS Take 1 tablet by mouth once. 32 tablet 0  .  valACYclovir (VALTREX) 500 MG tablet Take 500 mg by mouth daily.        No results found for this or any previous visit (from the past 48 hour(s)). No results found.  ROS  Blood pressure 116/61, pulse 63, temperature 97.6 F (36.4 C), temperature source Oral, resp. rate 17, height 5\' 7"  (1.702 m), weight 130 lb (58.968 kg), SpO2 100 %. Physical Exam  Constitutional: She appears well-developed and well-nourished.  HENT:  Mouth/Throat: Oropharynx is clear and moist.  Eyes: Conjunctivae are normal. No scleral icterus.  Neck: No thyromegaly present.  Cardiovascular: Normal rate, regular rhythm and normal heart sounds.   No murmur heard. Respiratory: Effort normal and breath sounds normal.  GI: Soft. She exhibits no distension and  no mass. There is no tenderness.  Musculoskeletal: She exhibits no edema.  Lymphadenopathy:    She has no cervical adenopathy.  Neurological: She is alert.  Skin: Skin is warm and dry.     Assessment/Plan Solid food dysphagia. Chronic GERD. Diarrhea and lower abdominal pain. EGD, ED and diagnostic colonoscopy.  Kathryn [name] U 09/20/2014, 2:05 PM

## 2014-09-20 NOTE — Discharge Instructions (Signed)
Resume usual diet and high diet. High fiber diet. No driving for 24 hours. Physician will call with biopsy results.  High-Fiber Diet Fiber is found in fruits, vegetables, and grains. A high-fiber diet encourages the addition of more whole grains, legumes, fruits, and vegetables in your diet. The recommended amount of fiber for adult males is 38 g per day. For adult females, it is 25 g per day. Pregnant and lactating women should get 28 g of fiber per day. If you have a digestive or bowel problem, ask your caregiver for advice before adding high-fiber foods to your diet. Eat a variety of high-fiber foods instead of only a select few type of foods.  PURPOSE  To increase stool bulk.  To make bowel movements more regular to prevent constipation.  To lower cholesterol.  To prevent overeating. WHEN IS THIS DIET USED?  It may be used if you have constipation and hemorrhoids.  It may be used if you have uncomplicated diverticulosis (intestine condition) and irritable bowel syndrome.  It may be used if you need help with weight management.  It may be used if you want to add it to your diet as a protective measure against atherosclerosis, diabetes, and cancer. SOURCES OF FIBER  Whole-grain breads and cereals.  Fruits, such as apples, oranges, bananas, berries, prunes, and pears.  Vegetables, such as green peas, carrots, sweet potatoes, beets, broccoli, cabbage, spinach, and artichokes.  Legumes, such split peas, soy, lentils.  Almonds. FIBER CONTENT IN FOODS Starches and Grains / Dietary Fiber (g)  Cheerios, 1 cup / 3 g  Corn Flakes cereal, 1 cup / 0.7 g  Rice crispy treat cereal, 1 cup / 0.3 g  Instant oatmeal (cooked),  cup / 2 g  Frosted wheat cereal, 1 cup / 5.1 g  Brown, long-grain rice (cooked), 1 cup / 3.5 g  White, long-grain rice (cooked), 1 cup / 0.6 g  Enriched macaroni (cooked), 1 cup / 2.5 g Legumes / Dietary Fiber (g)  Baked beans (canned, plain, or  vegetarian),  cup / 5.2 g  Kidney beans (canned),  cup / 6.8 g  Pinto beans (cooked),  cup / 5.5 g Breads and Crackers / Dietary Fiber (g)  Plain or honey graham crackers, 2 squares / 0.7 g  Saltine crackers, 3 squares / 0.3 g  Plain, salted pretzels, 10 pieces / 1.8 g  Whole-wheat bread, 1 slice / 1.9 g  White bread, 1 slice / 0.7 g  Raisin bread, 1 slice / 1.2 g  Plain bagel, 3 oz / 2 g  Flour tortilla, 1 oz / 0.9 g  Corn tortilla, 1 small / 1.5 g  Hamburger or hotdog bun, 1 small / 0.9 g Fruits / Dietary Fiber (g)  Apple with skin, 1 medium / 4.4 g  Sweetened applesauce,  cup / 1.5 g  Banana,  medium / 1.5 g  Grapes, 10 grapes / 0.4 g  Orange, 1 small / 2.3 g  Raisin, 1.5 oz / 1.6 g  Melon, 1 cup / 1.4 g Vegetables / Dietary Fiber (g)  Green beans (canned),  cup / 1.3 g  Carrots (cooked),  cup / 2.3 g  Broccoli (cooked),  cup / 2.8 g  Peas (cooked),  cup / 4.4 g  Mashed potatoes,  cup / 1.6 g  Lettuce, 1 cup / 0.5 g  Corn (canned),  cup / 1.6 g  Tomato,  cup / 1.1 g Document Released: 08/24/2005 Document Revised: 02/23/2012 Document Reviewed: 11/26/2011 [employer]  Patient Information 2015 [place]. This information is not intended to replace advice given to you by your health care provider. Make sure you discuss any questions you have with your health care provider. Esophageal Dilatation The esophagus is the long, narrow tube which carries food and liquid from the mouth to the stomach. Esophageal dilatation is the technique used to stretch a blocked or narrowed portion of the esophagus. This procedure is used when a part of the esophagus has become so narrow that it becomes difficult, painful or even impossible to swallow. This is generally an uncomplicated form of treatment. When this is not successful, chest surgery may be required. This is a much more extensive form of treatment with a longer recovery time. CAUSES  Some of the more  common causes of blockage or strictures of the esophagus are:  Narrowing from longstanding inflammation (soreness and redness) of the lower esophagus. This comes from the constant exposure of the lower esophagus to the acid which bubbles up from the stomach. Over time this causes scarring and narrowing of the lower esophagus.  Hiatal hernia in which a small part of the stomach bulges (herniates) up through the diaphragm. This can cause a gradual narrowing of the end of the esophagus.  Schatzki ring is a narrow ring of benign (non-cancerous) fibrous tissue which constricts the lower esophagus. The reason for this is not known.  Scleroderma is a connective tissue disorder that affects the esophagus and makes swallowing difficult.  Achalasia is an absence of nerves to the lower esophagus and to the esophageal sphincter. This is the circular muscle between the stomach and esophagus that relaxes to allow food into the stomach. After swallowing, it contracts to keep food in the stomach. This absence of nerves may be congenital (present since birth). This can cause irregular spasms of the lower esophageal muscle. This spasm does not open up to allow food and fluid through. The result is a persistent blockage with subsequent slow trickling of the esophageal contents into the stomach.  Strictures may develop from swallowing materials which damage the esophagus. Some examples are strong acids or alkalis such as lye.  Growths such as benign (non-cancerous) and malignant (cancerous) tumors can block the esophagus.  Hereditary (present since birth) causes. DIAGNOSIS  Your caregiver often suspects this problem by taking a medical history. They will also do a physical exam. They can then prove their suspicions using X-rays and endoscopy. Endoscopy is an exam in which a tube like a small, flexible telescope is used to look at your esophagus.  TREATMENT There are different stretching (dilating) techniques that  can be used. Simple bougie dilatation may be done in the office. This usually takes only a couple minutes. A numbing (anesthetic) spray of the throat is used. Endoscopy, when done, is done in an endoscopy suite under mild sedation. When fluoroscopy is used, the procedure is performed in X-ray. Other techniques require a little longer time. Recovery is usually quick. There is no waiting time to begin eating and drinking to test success of the treatment. Following are some of the methods used. Narrowing of the esophagus is treated by making it bigger. Commonly this is a mechanical problem which can be treated with stretching. This can be done in different ways. Your caregiver will discuss these with you. Some of the means used are:  A series of graduated (increasing thickness) flexible dilators can be used. These are weighted tubes passed through the esophagus into the stomach. The tubes used become  progressively larger until the desired stretched size is reached. Graduated dilators are a simple and quick way of opening the esophagus. No visualization is required.  Another method is the use of endoscopy to place a flexible wire across the stricture. The endoscope is removed and the wire left in place. A dilator with a hole through it from end to end is guided down the esophagus and across the stricture. One or more of these dilators are passed over the wire. At the end of the exam, the wire is removed. This type of treatment may be performed in the X-ray department under fluoroscopy. An advantage of this procedure is the examiner is visualizing the end opening in the esophagus.  Stretching of the esophagus may be done using balloons. Deflated balloons are placed through the endoscope and across the stricture. This type of balloon dilatation is often done at the time of endoscopy or fluoroscopy. Flexible endoscopy allows the examiner to directly view the stricture. A balloon is inserted in the deflated form  into the area of narrowing. It is then inflated with air to a certain pressure that is preset for a given circumference. When inflated, it becomes sausage shaped, stretched, and makes the stricture larger.  Achalasia requires a longer, larger balloon-type dilator. This is frequently done under X-ray control. In this situation, the spastic muscle fibers in the lower esophagus are stretched. All of the above procedures make the passage of food and water into the stomach easier. They also make it easier for stomach contents to reflux back into the esophagus. Special medications may be used following the procedure to help prevent further stricturing. Proton-pump inhibitor medications are good at decreasing the amount of acid in the stomach juice. When stomach juice refluxes into the esophagus, the juice is no longer as acidic and is less likely to burn or scar the esophagus. RISKS AND COMPLICATIONS Esophageal dilatation is usually performed effectively and without problems. Some complications that can occur are:  A small amount of bleeding almost always happens where the stretching takes place. If this is too excessive it may require more aggressive treatment.  An uncommon complication is perforation (making a hole) of the esophagus. The esophagus is thin. It is easy to make a hole in it. If this happens, an operation may be necessary to repair this.  A small, undetected perforation could lead to an infection in the chest. This can be very serious. HOME CARE INSTRUCTIONS   If you received sedation for your procedure, do not drive, make important decisions, or perform any activities requiring your full coordination. Do not drink alcohol, take sedatives, or use any mind altering chemicals unless instructed by your caregiver.  You may use throat lozenges or warm salt water gargles if you have throat discomfort.  You can begin eating and drinking normally on return home unless instructed otherwise. Do not  purposely try to force large chunks of food down to test the benefits of your procedure.  Mild discomfort can be eased with sips of ice water.  Medications for discomfort may or may not be needed. SEEK IMMEDIATE MEDICAL CARE IF:   You begin vomiting up blood.  You develop black, tarry stools.  You develop chills or an unexplained temperature of over 101F (38.3C)  You develop chest or abdominal pain.  You develop shortness of breath, or feel light-headed or faint.  Your swallowing is becoming more painful, difficult, or you are unable to swallow. MAKE SURE YOU:   Understand these instructions.  Will watch your condition.  Will get help right away if you are not doing well or get worse. Document Released: 10/15/2005 Document Revised: 01/08/2014 Document Reviewed: 12/02/2005 [facility] Patient Information 2015 [place], [place]. This information is not intended to replace advice given to you by your health care provider. Make sure you discuss any questions you have with your health care provider.   Esophagogastroduodenoscopy Care After Refer to this sheet in the next few weeks. These instructions provide you with information on caring for yourself after your procedure. Your caregiver may also give you more specific instructions. Your treatment has been planned according to current medical practices, but problems sometimes occur. Call your caregiver if you have any problems or questions after your procedure.  HOME CARE INSTRUCTIONS  Do not eat or drink anything until the numbing medicine (local anesthetic) has worn off and your gag reflex has returned. You will know that the local anesthetic has worn off when you can swallow comfortably.  Do not drive for 12 hours after the procedure or as directed by your caregiver.  Only take medicines as directed by your caregiver. SEEK MEDICAL CARE IF:   You cannot stop coughing.  You are not urinating at all or less than usual. SEEK  IMMEDIATE MEDICAL CARE IF:  You have difficulty swallowing.  You cannot eat or drink.  You have worsening throat or chest pain.  You have dizziness, lightheadedness, or you faint.  You have nausea or vomiting.  You have chills.  You have a fever.  You have severe abdominal pain.  You have black, tarry, or bloody stools. Document Released: 08/10/2012 Document Reviewed: 08/10/2012 [facility] Patient Information 2015 [place]. This information is not intended to replace advice given to you by your health care provider. Make sure you discuss any questions you have with your health care provider.   Colonoscopy, Care After Refer to this sheet in the next few weeks. These instructions provide you with information on caring for yourself after your procedure. Your health care provider may also give you more specific instructions. Your treatment has been planned according to current medical practices, but problems sometimes occur. Call your health care provider if you have any problems or questions after your procedure. WHAT TO EXPECT AFTER THE PROCEDURE  After your procedure, it is typical to have the following:  A small amount of blood in your stool.  Moderate amounts of gas and mild abdominal cramping or bloating. HOME CARE INSTRUCTIONS  Do not drive, operate machinery, or sign important documents for 24 hours.  You may shower and resume your regular physical activities, but move at a slower pace for the first 24 hours.  Take frequent rest periods for the first 24 hours.  Walk around or put a warm pack on your abdomen to help reduce abdominal cramping and bloating.  Drink enough fluids to keep your urine clear or pale yellow.  You may resume your normal diet as instructed by your health care provider. Avoid heavy or fried foods that are hard to digest.  Avoid drinking alcohol for 24 hours or as instructed by your health care provider.  Only take over-the-counter or  prescription medicines as directed by your health care provider.  If a tissue sample (biopsy) was taken during your procedure:  Do not take aspirin or blood thinners for 7 days, or as instructed by your health care provider.  Do not drink alcohol for 7 days, or as instructed by your health care provider.  Eat soft  foods for the first 24 hours. SEEK MEDICAL CARE IF: You have persistent spotting of blood in your stool 2-3 days after the procedure. SEEK IMMEDIATE MEDICAL CARE IF:  You have more than a small spotting of blood in your stool.  You pass large blood clots in your stool.  Your abdomen is swollen (distended).  You have nausea or vomiting.  You have a fever.  You have increasing abdominal pain that is not relieved with medicine. Document Released: 04/07/2004 Document Revised: 06/14/2013 Document Reviewed: 05/01/2013 [facility] Patient Information 2015 [place], [place]. This information is not intended to replace advice given to you by your health care provider. Make sure you discuss any questions you have with your health care provider.

## 2014-09-20 NOTE — Op Note (Addendum)
EGD, ED AND COLONOSCOPY  PROCEDURE REPORT  PATIENT:  Kathryn [name]  MR#:  [id number] Birthdate:  March 04, 1956, 59 y.o., female Endoscopist:  Dr. Rogene Houston, MD Referred By:  Dr. Rocky Morel, MD Procedure Date: 09/20/2014  Procedure:   EGD, ED & Colonoscopy  Indications:  Patient is 59 year old Caucasian female who has chronic GERD and now presents with over 6 month history of dysphagia to solids. She feels heartburn is well controlled with therapy. She also complains of lower abdominal pain and diarrhea. She used to have constipation but now her symptoms have flipped. Last colonoscopy was in 2009. Family history is negative for CRC in first-degree relative.            Informed Consent:  The risks, benefits, alternatives & imponderables which include, but are not limited to, bleeding, infection, perforation, drug reaction and potential missed lesion have been reviewed.  The potential for biopsy, lesion removal, esophageal dilation, etc. have also been discussed.  Questions have been answered.  All parties agreeable.  Please see history & physical in medical record for more information.  Medications:  Demerol 50 mg IV Versed 10 mg IV Cetacaine spray topically for oropharyngeal anesthesia  EGD  Description of procedure:  The endoscope was introduced through the mouth and advanced to the second portion of the duodenum without difficulty or limitations. The mucosal surfaces were surveyed very carefully during advancement of the scope and upon withdrawal.  Findings:  Esophagus:  Mucosa of the esophagus was normal. GE junction was unremarkable without ring or stricture formation. GEJ:  38 cm Hiatus:  40 cm Stomach:  Stomach was empty and distended very well with insufflation. Folds in the proximal stomach are normal. Examination mucosa gastric bite was normal. Mucosa at antrum was abnormal with the mortise diffuse changes of edema and erythema. No erosions or ulcers are noted.  Pyloric channel was patent. Angularis fundus and cardia were normal. Duodenum:  Bulbar and post bulbar mucosa was normal.   Therapeutic/Diagnostic Maneuvers Performed:   Esophagus was dilated by passing 54 French Maloney dilator to full insertion. Endoscope was passed again and mucosa reexamined and no mucosal disruption noted. Antral biopsy was taken for routine histology. Endoscope was then withdrawn and patient appear for procedure #2.  COLONOSCOPY Description of procedure:  After a digital rectal exam was performed, that colonoscope was advanced from the anus through the rectum and colon to the area of the cecum, ileocecal valve and appendiceal orifice. The cecum was deeply intubated. These structures were well-seen and photographed for the record. From the level of the cecum and ileocecal valve, the scope was slowly and cautiously withdrawn. The mucosal surfaces were carefully surveyed utilizing scope tip to flexion to facilitate fold flattening as needed. The scope was pulled down into the rectum where a thorough exam including retroflexion was performed.  Findings:   Prep was excellent except she had thick layer of stool coating cecal and ascending colon mucosa. Cecal landmarks were well identified after vigorous washing. Small polyp ablated via cold biopsy from appendiceal stump. Mucosa of rest of the colon was normal. Normal mucosa of rectum and anorectal junction.  Therapeutic/Diagnostic Maneuvers Performed:   Random biopsies taken from mucosa of sigmoid colon. Complications:  None  Cecal Withdrawal Time:  13 minutes  Impression:  EGD findings; Small sliding hiatal hernia without ring or stricture formation. Antral gastritis. Biopsy taken. Esophagus dilated by passing 54 French Maloney dilator but no mucosal disruption induced.  Colonoscopy findings; Small polyp at  appendiceal stump was ablated via cold biopsy. No evidence of endoscopic colitis. Random biopsies taken from  mucosa of sigmoid colon looking for microscopic or collagenous colitis.   Recommendations:  Standard instructions given. I will be contacting patient with biopsy results and further recommendations.  Enora Trillo U  09/20/2014 3:04 PM  CC: Dr. Delman Cheadle, PA-C & Dr. Rayne Du ref. provider found

## 2014-10-10 ENCOUNTER — Encounter (HOSPITAL_COMMUNITY): Payer: Self-pay | Admitting: Internal Medicine

## 2014-10-24 ENCOUNTER — Encounter (INDEPENDENT_AMBULATORY_CARE_PROVIDER_SITE_OTHER): Payer: Self-pay | Admitting: *Deleted

## 2014-12-14 ENCOUNTER — Other Ambulatory Visit: Payer: Self-pay | Admitting: Family Medicine

## 2014-12-14 DIAGNOSIS — N6310 Unspecified lump in the right breast, unspecified quadrant: Secondary | ICD-10-CM

## 2015-01-25 ENCOUNTER — Other Ambulatory Visit (HOSPITAL_COMMUNITY): Payer: Self-pay | Admitting: Family Medicine

## 2015-01-25 ENCOUNTER — Ambulatory Visit (HOSPITAL_COMMUNITY)
Admission: RE | Admit: 2015-01-25 | Discharge: 2015-01-25 | Disposition: A | Discharge status: Home / Self Care | Payer: BLUE CROSS/BLUE SHIELD | Source: Ambulatory Visit | Attending: Family Medicine | Admitting: Family Medicine

## 2015-01-25 DIAGNOSIS — S43421A Sprain of right rotator cuff capsule, initial encounter: Secondary | ICD-10-CM

## 2015-01-25 DIAGNOSIS — R937 Abnormal findings on diagnostic imaging of other parts of musculoskeletal system: Secondary | ICD-10-CM | POA: Insufficient documentation

## 2015-01-25 DIAGNOSIS — M25511 Pain in right shoulder: Secondary | ICD-10-CM | POA: Diagnosis not present

## 2015-08-23 ENCOUNTER — Other Ambulatory Visit (HOSPITAL_COMMUNITY): Payer: Self-pay | Admitting: Internal Medicine

## 2015-08-23 DIAGNOSIS — IMO0002 Reserved for concepts with insufficient information to code with codable children: Secondary | ICD-10-CM

## 2015-08-23 DIAGNOSIS — R229 Localized swelling, mass and lump, unspecified: Principal | ICD-10-CM

## 2015-08-27 ENCOUNTER — Ambulatory Visit (HOSPITAL_COMMUNITY)
Admission: RE | Admit: 2015-08-27 | Discharge: 2015-08-27 | Disposition: A | Discharge status: Home / Self Care | Payer: BLUE CROSS/BLUE SHIELD | Source: Ambulatory Visit | Attending: Internal Medicine | Admitting: Internal Medicine

## 2015-08-27 DIAGNOSIS — IMO0002 Reserved for concepts with insufficient information to code with codable children: Secondary | ICD-10-CM

## 2015-08-27 DIAGNOSIS — R229 Localized swelling, mass and lump, unspecified: Secondary | ICD-10-CM | POA: Diagnosis not present

## 2015-09-03 ENCOUNTER — Encounter (HOSPITAL_COMMUNITY): Payer: BLUE CROSS/BLUE SHIELD

## 2016-01-21 NOTE — Addendum Note (Signed)
Encounter addended by: Leeroy Cha, PT on: 01/21/2016 11:08 AM<BR>     Documentation filed: Episodes, Clinical Notes

## 2016-02-06 NOTE — H&P (Signed)
Kathryn [name] is an 60 y.o. female.    Chief Complaint: right shoulder pain and weakness  HPI: Pt is a 60 y.o. female complaining of right shoulder pain for multiple years. Pain had continually increased since the beginning. X-rays in the clinic show end-stage arthritic changes of the right shoulder. Pt has tried various conservative treatments which have failed to alleviate their symptoms, including injections and therapy. Various options are discussed with the patient. Risks, benefits and expectations were discussed with the patient. Patient understand the risks, benefits and expectations and wishes to proceed with surgery.   PCP:  Jana Half  D/C Plans: Home  PMH: Past Medical History  Diagnosis Date  . Chest pain, atypical     had cardiac workup - deemed related to anxiety  . Dyspnea   . Anxiety   . PONV (postoperative nausea and vomiting)   . GERD (gastroesophageal reflux disease)   . H/O hiatal hernia   . H/O dizziness   . Arthritis   . Hepatitis     hepatitis A as a child  . Guillain-Barre syndrome following vaccination   . Herpes simplex     PSH: Past Surgical History  Procedure Laterality Date  . Anterior cervical decomp/discectomy fusion      C5 and C6, Dr. Otilio Connors, MD 2015  . Cholecystectomy    . Appendectomy    . Abdominal hysterectomy    . Rotator cuff repair Right   . Lumbar laminectomy/decompression microdiscectomy Right 06/22/2013    Procedure: RIGHT LUMBAR TWO-THREE DISCECTOMY;  Surgeon: Otilio Connors, MD;  Location: [facility];  Service: Neurosurgery;  Laterality: Right;  Right   . Colonoscopy N/A 09/20/2014    Procedure: COLONOSCOPY;  Surgeon: Rogene Houston, MD;  [facility];  Service: Endoscopy;  Laterality: N/A;  200  . Esophagogastroduodenoscopy N/A 09/20/2014    Procedure: ESOPHAGOGASTRODUODENOSCOPY (EGD);  Surgeon: Rogene Houston, MD;  [facility]: [facility];  Service: Endoscopy;  Laterality: N/A;  Venia Minks  dilation N/A 09/20/2014    Procedure: Venia Minks DILATION;  Surgeon: Rogene Houston, MD;  Location: [facility];  Service: Endoscopy;  Laterality: N/A;    Social History:  reports that she has quit smoking. She does not have any smokeless tobacco history on file. She reports that she does not drink alcohol or use illicit drugs.  Allergies:  Allergies  Allergen Reactions  . Niacin And Related Shortness Of Breath and Swelling    Neck; skin hot  . Codeine Swelling    tongue    Medications: No current facility-administered medications for this encounter.   Current Outpatient Prescriptions  Medication Sig Dispense Refill  . ALPRAZolam (XANAX) 0.5 MG tablet Take 0.5 mg by mouth as needed for anxiety.     . Biotin 5000 MCG CAPS Take 1 capsule by mouth 2 (two) times daily.     . cholecalciferol (VITAMIN D) 1000 UNITS tablet Take 2,000 Units by mouth daily.     . diclofenac (VOLTAREN) 75 MG EC tablet Take 75 mg by mouth 2 (two) times daily.    Marland Kitchen dicyclomine (BENTYL) 10 MG capsule Take 1 capsule (10 mg total) by mouth 2 (two) times daily. 60 capsule 3  . hydroxypropyl methylcellulose (ISOPTO TEARS) 2.5 % ophthalmic solution Place 2 drops into both eyes 3 (three) times daily as needed (for dry eyes/ allergies).    . loratadine (CLARITIN) 10 MG tablet Take 10 mg by mouth at bedtime.     Marland Kitchen  magnesium oxide (MAG-OX) 400 MG tablet Take 500 mg by mouth every other day.    . pantoprazole (PROTONIX) 40 MG tablet Take 40 mg by mouth 2 (two) times daily.     . pseudoephedrine-guaifenesin (MUCINEX D) 60-600 MG per tablet Take 2 tablets by mouth daily.    . valACYclovir (VALTREX) 500 MG tablet Take 500 mg by mouth daily.        No results found for this or any previous visit (from the past 48 hour(s)). No results found.  ROS: Pain with rom of the right upper extremity  Physical Exam: Alert and oriented 60 y.o. female in no acute distress Cranial nerves 2-12 intact Cervical spine: full rom with no  tenderness, nv intact distally Chest: active breath sounds bilaterally, no wheeze rhonchi or rales Heart: regular rate and rhythm, no murmur Abd: non tender non distended with active bowel sounds Hip is stable with rom  Right shoulder with limited rom and strength due to arthropathy nv intact distally No rashes or edema  Assessment/Plan Assessment: right rotator cuff arthropathy  Plan: Patient will undergo a right reverse total shoulder by Dr. Veverly Fells at [facility]. Risks benefits and expectations were discussed with the patient. Patient understand risks, benefits and expectations and wishes to proceed.

## 2016-02-12 NOTE — Pre-Procedure Instructions (Addendum)
Kathryn [name]  02/12/2016      [id number] - [place], [address] [place] [address] Phone: [phone] Fax: [phone]    Your procedure is scheduled on Fri, June 9 @ 2:45 PM  Report to [facility] Admitting at 12:45 PM  Call this number if you have problems the morning of surgery:  [phone]   Remember:  Do not eat food or drink liquids after midnight.  Take these medicines the morning of surgery with A SIP OF WATER Xanax(Alprazolam,if needed),Claritin(Loratadine-if needed),Pantoprazole(Protonix),Tramadol(Ultram-if needed),and Valtrex(Valcyclovir), hydrocortisone               No Goody's,BC's,Aleve,Advil,Motrin,Aspirin,Fish Oil,or any Herbal Medications.    Do not wear jewelry, make-up or nail polish.  Do not wear lotions, powders, or perfumes.   Do not shave 48 hours prior to surgery.    Do not bring valuables to the hospital.  [facility] is not responsible for any belongings or valuables.  Contacts, dentures or bridgework may not be worn into surgery.  Leave your suitcase in the car.  After surgery it may be brought to your room.  For patients admitted to the hospital, discharge time will be determined by your treatment team.  Patients discharged the day of surgery will not be allowed to drive home.    Special instructions:  [place] - Preparing for Surgery  Before surgery, you can play an important role.  Because skin is not sterile, your skin needs to be as free of germs as possible.  You can reduce the number of germs on you skin by washing with CHG (chlorahexidine gluconate) soap before surgery.  CHG is an antiseptic cleaner which kills germs and bonds with the skin to continue killing germs even after washing.  Please DO NOT use if you have an allergy to CHG or antibacterial soaps.  If your skin becomes reddened/irritated stop using the CHG and inform your nurse when you arrive at Short  Stay.  Do not shave (including legs and underarms) for at least 48 hours prior to the first CHG shower.  You may shave your face.  Please follow these instructions carefully:   1.  Shower with CHG Soap the night before surgery and the                                morning of Surgery.  2.  If you choose to wash your hair, wash your hair first as usual with your       normal shampoo.  3.  After you shampoo, rinse your hair and body thoroughly to remove the                      Shampoo.  4.  Use CHG as you would any other liquid soap.  You can apply chg directly       to the skin and wash gently with scrungie or a clean washcloth.  5.  Apply the CHG Soap to your body ONLY FROM THE NECK DOWN.        Do not use on open wounds or open sores.  Avoid contact with your eyes,       ears, mouth and genitals (private parts).  Wash genitals (private parts)       with your normal soap.  6.  Wash thoroughly, paying special attention to  the area where your surgery        will be performed.  7.  Thoroughly rinse your body with warm water from the neck down.  8.  DO NOT shower/wash with your normal soap after using and rinsing off       the CHG Soap.  9.  Pat yourself dry with a clean towel.            10.  Wear clean pajamas.            11.  Place clean sheets on your bed the night of your first shower and do not        sleep with pets.  Day of Surgery  Do not apply any lotions/deoderants the morning of surgery.  Please wear clean clothes to the hospital/surgery center.    Please read over the following fact sheets that you were given. MRSA Information

## 2016-02-13 ENCOUNTER — Encounter (HOSPITAL_COMMUNITY): Payer: Self-pay

## 2016-02-13 ENCOUNTER — Encounter (HOSPITAL_COMMUNITY)
Admission: RE | Admit: 2016-02-13 | Discharge: 2016-02-13 | Disposition: A | Discharge status: Home / Self Care | Payer: BLUE CROSS/BLUE SHIELD | Source: Ambulatory Visit | Attending: Orthopedic Surgery | Admitting: Orthopedic Surgery

## 2016-02-13 ENCOUNTER — Encounter (HOSPITAL_COMMUNITY): Payer: Self-pay | Admitting: Vascular Surgery

## 2016-02-13 DIAGNOSIS — Z7952 Long term (current) use of systemic steroids: Secondary | ICD-10-CM | POA: Diagnosis not present

## 2016-02-13 DIAGNOSIS — Z01818 Encounter for other preprocedural examination: Secondary | ICD-10-CM | POA: Insufficient documentation

## 2016-02-13 DIAGNOSIS — Z01812 Encounter for preprocedural laboratory examination: Secondary | ICD-10-CM | POA: Insufficient documentation

## 2016-02-13 DIAGNOSIS — K219 Gastro-esophageal reflux disease without esophagitis: Secondary | ICD-10-CM | POA: Insufficient documentation

## 2016-02-13 DIAGNOSIS — Z79899 Other long term (current) drug therapy: Secondary | ICD-10-CM | POA: Insufficient documentation

## 2016-02-13 DIAGNOSIS — Z981 Arthrodesis status: Secondary | ICD-10-CM | POA: Diagnosis not present

## 2016-02-13 DIAGNOSIS — Z87891 Personal history of nicotine dependence: Secondary | ICD-10-CM | POA: Insufficient documentation

## 2016-02-13 DIAGNOSIS — M19011 Primary osteoarthritis, right shoulder: Secondary | ICD-10-CM | POA: Diagnosis not present

## 2016-02-13 HISTORY — DX: Unspecified adrenocortical insufficiency: E27.40

## 2016-02-13 LAB — BASIC METABOLIC PANEL
Anion gap: 6 (ref 5–15)
BUN: 12 mg/dL (ref 6–20)
CALCIUM: 9.6 mg/dL (ref 8.9–10.3)
CHLORIDE: 105 mmol/L (ref 101–111)
CO2: 26 mmol/L (ref 22–32)
CREATININE: 0.8 mg/dL (ref 0.44–1.00)
GFR calc non Af Amer: >60 mL/min (ref >60)
GLUCOSE: 94 mg/dL (ref 65–99)
Potassium: 3.4 mmol/L — ABNORMAL LOW (ref 3.5–5.1)
Sodium: 137 mmol/L (ref 135–145)

## 2016-02-13 LAB — CBC
HCT: 35.1 % — ABNORMAL LOW (ref 36.0–46.0)
HEMOGLOBIN: 11.6 g/dL — AB (ref 12.0–15.0)
MCH: 31.4 pg (ref 26.0–34.0)
MCHC: 33 g/dL (ref 30.0–36.0)
MCV: 94.9 fL (ref 78.0–100.0)
PLATELETS: 249 10*3/uL (ref 150–400)
RBC: 3.7 MIL/uL — AB (ref 3.87–5.11)
RDW: 12.3 % (ref 11.5–15.5)
WBC: 6.9 10*3/uL (ref 4.0–10.5)

## 2016-02-13 LAB — SURGICAL PCR SCREEN
MRSA, PCR: NEGATIVE
Staphylococcus aureus: NEGATIVE

## 2016-02-13 NOTE — Progress Notes (Signed)
Anesthesia Chart Review: Patient is a 60 year old female posted for right shoulder reverse, total shoulder arthroplasty on 02/14/16 by Dr. Veverly Fells.    History includes former smoker, post-operative N/V, GERD, hiatal hernia, RLS, anxiety, dyspnea, Hepatitis A (child), Guillain-Barre syndrome (following swine flu vaccination), HSV, hysterectomy, appendectomy cholecystectomy, C5-7 ACDF '04, L4-5 PLIF '14. In 2014, she reported negative work-up for atypical chest pain (> 3 years ago). Today she reported that she was recently diagnosed (~ Spring 2017) with adrenal insufficiency following steroid injection to her shoulder. She has been on Cortef for 2-3 months per endocrinologist Dr. Chalmers Cater (states Dr. Chalmers Cater has instructed her to double her dose after surgery and was sending recommendations to Dr. Veverly Fells' office)  PCP Dr. Gerarda Fraction cleared patient for surgery with recommendations for stress dose steroids perioperatively.   Endocrinologist Dr. Chalmers Cater cleared patient from a endocrinology standpoint with recommendations to give IV hydrocortisone 50 mg 30 minutes before surgery and continue every 8 hours till patient can take po. Other instructions once taking po's also outlined which would support patient's account that she is to double her current dose.   Meds include Xanax, Bentyl, Cortef, Claritin, Mucinex, Protonix, tramadol, Valtrex.  Preoperative labs noted. K 3.4, Cr 0.80. H/H 11.6/35.1. Glucose 94.   If no acute changes then I would anticipate that she could proceed as planned.  George Hugh [facility] Stay Center/Anesthesiology Phone [phone] 02/13/2016 4:25 PM

## 2016-02-13 NOTE — Anesthesia Preprocedure Evaluation (Deleted)
Anesthesia Evaluation    Airway        Dental   Pulmonary former smoker,           Cardiovascular      Neuro/Psych    GI/Hepatic   Endo/Other    Renal/GU      Musculoskeletal   Abdominal   Peds  Hematology   Anesthesia Other Findings   Reproductive/Obstetrics                             Anesthesia Physical Anesthesia Plan  ASA:   Anesthesia Plan:    Post-op Pain Management:    Induction:   Airway Management Planned:   Additional Equipment:   Intra-op Plan:   Post-operative Plan:   Informed Consent:   Plan Discussed with:   Anesthesia Plan Comments: (See endocrinology note regarding stress dose steroids. Myra Gianotti, PA-C)        Anesthesia Quick Evaluation

## 2016-02-13 NOTE — Progress Notes (Signed)
Call back to Dr. Veverly Fells' office for fax fr. From Dr. Chalmers Cater, will fax request now to another fax. No.

## 2016-02-13 NOTE — Progress Notes (Signed)
Pt. Reports that her adrenal glands shutdown, &/or had decreasing cortisol related to the shoulder injection that she had.  Call to A. Zelenak,PA-C, requesting last note & surgery/ steroid plan from Dr. Chalmers Cater.

## 2016-02-13 NOTE — Progress Notes (Signed)
Via fax, requested Dr. Almetta Lovely note to Dr. Veverly Fells regarding endocrine needs.

## 2016-02-20 NOTE — Progress Notes (Signed)
I spoke with Ms Eppinger to inform her of arrival time of 10:20 for OR tomorrow.  I shared with patient the orders we have from Dr Chalmers Cater, for patient to receive IV Hydrocortison 30 minutes prior to OR.  Ms Arner then asked is she should still take am dose of Hydrocortisone.  I called Dr Gardiner Barefoot office and spoke with Evon who spoke with Dr Chalmers Cater. Instructions from Dr Chalmers Cater is for patient to take am dose of Hydrocortisone in am ("since surgery is at 1225")  And then receive IV Hydrocortisone 30 minutes prior to surgery.  I called and left a message on Emylie's voice mail with the instructions from Dr Suzette Battiest.Marland Kitchen

## 2016-02-20 NOTE — Anesthesia Preprocedure Evaluation (Addendum)
Anesthesia Evaluation  Patient identified by MRN, date of birth, ID band Patient awake    Reviewed: Allergy & Precautions, NPO status , Patient's Chart, lab work & pertinent test results  History of Anesthesia Complications (+) PONV  Airway Mallampati: I  TM Distance: >3 FB Neck ROM: Full    Dental  (+) Dental Advisory Given   Pulmonary former smoker,    breath sounds clear to auscultation       Cardiovascular negative cardio ROS   Rhythm:Regular Rate:Normal     Neuro/Psych Anxiety  Neuromuscular disease    GI/Hepatic Neg liver ROS, hiatal hernia, GERD  ,  Endo/Other  Adrenal insufficiency   Renal/GU negative Renal ROS     Musculoskeletal  (+) Arthritis ,   Abdominal   Peds  Hematology negative hematology ROS (+)   Anesthesia Other Findings   Reproductive/Obstetrics                           Lab Results  Component Value Date   WBC 6.9 02/13/2016   HGB 11.6* 02/13/2016   HCT 35.1* 02/13/2016   MCV 94.9 02/13/2016   PLT 249 02/13/2016   Lab Results  Component Value Date   CREATININE 0.80 02/13/2016   BUN 12 02/13/2016   NA 137 02/13/2016   K 3.4* 02/13/2016   CL 105 02/13/2016   CO2 26 02/13/2016    Anesthesia Physical Anesthesia Plan  ASA: II  Anesthesia Plan: General and Regional   Post-op Pain Management: GA combined w/ Regional for post-op pain   Induction: Intravenous  Airway Management Planned: Oral ETT  Additional Equipment:   Intra-op Plan:   Post-operative Plan: Extubation in OR  Informed Consent: I have reviewed the patients History and Physical, chart, labs and discussed the procedure including the risks, benefits and alternatives for the proposed anesthesia with the patient or authorized representative who has indicated his/her understanding and acceptance.   Dental advisory given  Plan Discussed with: CRNA  Anesthesia Plan Comments:         Anesthesia Quick Evaluation

## 2016-02-21 ENCOUNTER — Inpatient Hospital Stay (HOSPITAL_COMMUNITY): Payer: BLUE CROSS/BLUE SHIELD | Admitting: Vascular Surgery

## 2016-02-21 ENCOUNTER — Encounter (HOSPITAL_COMMUNITY): Payer: Self-pay | Admitting: General Practice

## 2016-02-21 ENCOUNTER — Inpatient Hospital Stay (HOSPITAL_COMMUNITY)
Admission: RE | Admit: 2016-02-21 | Discharge: 2016-02-23 | DRG: 483 | Disposition: A | Discharge status: Home / Self Care | Payer: BLUE CROSS/BLUE SHIELD | Source: Ambulatory Visit | Attending: Orthopedic Surgery | Admitting: Orthopedic Surgery

## 2016-02-21 ENCOUNTER — Encounter (HOSPITAL_COMMUNITY)
Admission: RE | Disposition: A | Discharge status: Home / Self Care | Payer: Self-pay | Source: Ambulatory Visit | Attending: Orthopedic Surgery

## 2016-02-21 ENCOUNTER — Inpatient Hospital Stay (HOSPITAL_COMMUNITY): Payer: BLUE CROSS/BLUE SHIELD

## 2016-02-21 ENCOUNTER — Encounter (HOSPITAL_COMMUNITY): Admission: RE | Payer: Self-pay | Source: Ambulatory Visit

## 2016-02-21 ENCOUNTER — Inpatient Hospital Stay (HOSPITAL_COMMUNITY)
Admission: RE | Admit: 2016-02-21 | Payer: BLUE CROSS/BLUE SHIELD | Source: Ambulatory Visit | Admitting: Orthopedic Surgery

## 2016-02-21 DIAGNOSIS — Z87891 Personal history of nicotine dependence: Secondary | ICD-10-CM | POA: Diagnosis not present

## 2016-02-21 DIAGNOSIS — Z981 Arthrodesis status: Secondary | ICD-10-CM | POA: Diagnosis not present

## 2016-02-21 DIAGNOSIS — X58XXXA Exposure to other specified factors, initial encounter: Secondary | ICD-10-CM | POA: Diagnosis present

## 2016-02-21 DIAGNOSIS — K219 Gastro-esophageal reflux disease without esophagitis: Secondary | ICD-10-CM | POA: Diagnosis present

## 2016-02-21 DIAGNOSIS — Z96619 Presence of unspecified artificial shoulder joint: Secondary | ICD-10-CM

## 2016-02-21 DIAGNOSIS — S51812A Laceration without foreign body of left forearm, initial encounter: Secondary | ICD-10-CM | POA: Diagnosis present

## 2016-02-21 DIAGNOSIS — M75101 Unspecified rotator cuff tear or rupture of right shoulder, not specified as traumatic: Principal | ICD-10-CM | POA: Diagnosis present

## 2016-02-21 DIAGNOSIS — M19011 Primary osteoarthritis, right shoulder: Secondary | ICD-10-CM | POA: Diagnosis present

## 2016-02-21 DIAGNOSIS — Z96611 Presence of right artificial shoulder joint: Secondary | ICD-10-CM

## 2016-02-21 DIAGNOSIS — M25511 Pain in right shoulder: Secondary | ICD-10-CM | POA: Diagnosis present

## 2016-02-21 HISTORY — DX: Restless legs syndrome: G25.81

## 2016-02-21 HISTORY — DX: Unspecified chronic bronchitis: J42

## 2016-02-21 HISTORY — PX: INCISION AND DRAINAGE OF WOUND: SHX1803

## 2016-02-21 HISTORY — DX: Low back pain: M54.5

## 2016-02-21 HISTORY — PX: REVERSE SHOULDER ARTHROPLASTY: SHX5054

## 2016-02-21 HISTORY — PX: MINOR IRRIGATION AND DEBRIDEMENT OF WOUND: SHX6239

## 2016-02-21 HISTORY — DX: Other chronic pain: G89.29

## 2016-02-21 HISTORY — PX: REVERSE TOTAL SHOULDER ARTHROPLASTY: SHX2344

## 2016-02-21 HISTORY — DX: Pneumonia, unspecified organism: J18.9

## 2016-02-21 HISTORY — DX: Low back pain, unspecified: M54.50

## 2016-02-21 HISTORY — DX: Personal history of other diseases of the nervous system and sense organs: Z86.69

## 2016-02-21 SURGERY — ARTHROPLASTY, SHOULDER, TOTAL, REVERSE
Anesthesia: Regional | Site: Shoulder | Laterality: Right

## 2016-02-21 MED ORDER — CEFAZOLIN SODIUM-DEXTROSE 2-4 GM/100ML-% IV SOLN
2.0000 g | Freq: Four times a day (QID) | INTRAVENOUS | Status: AC
  Administered 2016-02-21 – 2016-02-22 (×3): 2 g via INTRAVENOUS
  Filled 2016-02-21 (×3): qty 100

## 2016-02-21 MED ORDER — TRAMADOL HCL 50 MG PO TABS: 50.0000 mg | ORAL_TABLET | Freq: Four times a day (QID) | ORAL | Status: DC | PRN

## 2016-02-21 MED ORDER — FENTANYL CITRATE (PF) 100 MCG/2ML IJ SOLN
25.0000 ug | INTRAMUSCULAR | Status: DC | PRN
  Administered 2016-02-21: 25 ug via INTRAVENOUS

## 2016-02-21 MED ORDER — FENTANYL CITRATE (PF) 100 MCG/2ML IJ SOLN
INTRAMUSCULAR | Status: AC
  Administered 2016-02-21: 50 ug
  Filled 2016-02-21: qty 2

## 2016-02-21 MED ORDER — CHLORHEXIDINE GLUCONATE 4 % EX LIQD: 60.0000 mL | Freq: Once | CUTANEOUS | Status: DC

## 2016-02-21 MED ORDER — ACETAMINOPHEN 325 MG PO TABS: 650.0000 mg | ORAL_TABLET | Freq: Four times a day (QID) | ORAL | Status: DC | PRN

## 2016-02-21 MED ORDER — SUCCINYLCHOLINE CHLORIDE 20 MG/ML IJ SOLN
INTRAMUSCULAR | Status: DC | PRN
  Administered 2016-02-21: 100 mg via INTRAVENOUS

## 2016-02-21 MED ORDER — MIDAZOLAM HCL 2 MG/2ML IJ SOLN
INTRAMUSCULAR | Status: AC
  Filled 2016-02-21: qty 2

## 2016-02-21 MED ORDER — DEXTROSE 5 % IV SOLN
500.0000 mg | Freq: Four times a day (QID) | INTRAVENOUS | Status: DC | PRN
  Filled 2016-02-21 (×2): qty 5

## 2016-02-21 MED ORDER — OXYCODONE HCL 5 MG PO TABS
ORAL_TABLET | ORAL | Status: AC
  Administered 2016-02-21: 10 mg via ORAL
  Filled 2016-02-21: qty 2

## 2016-02-21 MED ORDER — MIDAZOLAM HCL 5 MG/5ML IJ SOLN
INTRAMUSCULAR | Status: DC | PRN
  Administered 2016-02-21: 2 mg via INTRAVENOUS

## 2016-02-21 MED ORDER — BUPIVACAINE-EPINEPHRINE (PF) 0.25% -1:200000 IJ SOLN
INTRAMUSCULAR | Status: AC
  Filled 2016-02-21: qty 30

## 2016-02-21 MED ORDER — PANTOPRAZOLE SODIUM 40 MG PO TBEC
40.0000 mg | DELAYED_RELEASE_TABLET | Freq: Every day | ORAL | Status: DC
  Administered 2016-02-23: 40 mg via ORAL
  Filled 2016-02-21: qty 1

## 2016-02-21 MED ORDER — CEFAZOLIN SODIUM-DEXTROSE 2-4 GM/100ML-% IV SOLN
INTRAVENOUS | Status: AC
  Filled 2016-02-21: qty 100

## 2016-02-21 MED ORDER — DM-GUAIFENESIN ER 30-600 MG PO TB12
1.0000 | ORAL_TABLET | Freq: Every day | ORAL | Status: DC
  Administered 2016-02-23: 1 via ORAL
  Filled 2016-02-21 (×2): qty 1

## 2016-02-21 MED ORDER — HYDROMORPHONE HCL 1 MG/ML IJ SOLN
0.5000 mg | INTRAMUSCULAR | Status: DC | PRN
  Administered 2016-02-22 (×2): 0.5 mg via INTRAVENOUS
  Filled 2016-02-21 (×2): qty 1

## 2016-02-21 MED ORDER — PROPOFOL 10 MG/ML IV BOLUS
INTRAVENOUS | Status: DC | PRN
  Administered 2016-02-21: 120 mg via INTRAVENOUS

## 2016-02-21 MED ORDER — BISACODYL 10 MG RE SUPP: 10.0000 mg | Freq: Every day | RECTAL | Status: DC | PRN

## 2016-02-21 MED ORDER — ONDANSETRON HCL 4 MG/2ML IJ SOLN
INTRAMUSCULAR | Status: DC | PRN
  Administered 2016-02-21: 4 mg via INTRAVENOUS

## 2016-02-21 MED ORDER — METOCLOPRAMIDE HCL 5 MG PO TABS: 5.0000 mg | ORAL_TABLET | Freq: Three times a day (TID) | ORAL | Status: DC | PRN

## 2016-02-21 MED ORDER — BUPIVACAINE-EPINEPHRINE (PF) 0.5% -1:200000 IJ SOLN
INTRAMUSCULAR | Status: DC | PRN
  Administered 2016-02-21: 25 mL via PERINEURAL

## 2016-02-21 MED ORDER — DICYCLOMINE HCL 10 MG PO CAPS: 10.0000 mg | ORAL_CAPSULE | Freq: Two times a day (BID) | ORAL | Status: DC

## 2016-02-21 MED ORDER — LIDOCAINE 2% (20 MG/ML) 5 ML SYRINGE
INTRAMUSCULAR | Status: AC
  Filled 2016-02-21: qty 5

## 2016-02-21 MED ORDER — CEFAZOLIN SODIUM-DEXTROSE 2-4 GM/100ML-% IV SOLN
2.0000 g | INTRAVENOUS | Status: AC
  Administered 2016-02-21: 2 g via INTRAVENOUS

## 2016-02-21 MED ORDER — HYDROCORTISONE NA SUCCINATE PF 100 MG IJ SOLR
50.0000 mg | Freq: Once | INTRAMUSCULAR | Status: AC
  Administered 2016-02-21: 50 mg via INTRAVENOUS
  Filled 2016-02-21: qty 1

## 2016-02-21 MED ORDER — ONDANSETRON HCL 4 MG PO TABS
4.0000 mg | ORAL_TABLET | Freq: Four times a day (QID) | ORAL | Status: DC | PRN
  Administered 2016-02-22: 4 mg via ORAL

## 2016-02-21 MED ORDER — HYDROCORTISONE 5 MG PO TABS
10.0000 mg | ORAL_TABLET | Freq: Every day | ORAL | Status: DC
  Administered 2016-02-23: 10 mg via ORAL
  Filled 2016-02-21 (×2): qty 2

## 2016-02-21 MED ORDER — VALACYCLOVIR HCL 500 MG PO TABS
1000.0000 mg | ORAL_TABLET | Freq: Every day | ORAL | Status: DC
  Filled 2016-02-21: qty 2

## 2016-02-21 MED ORDER — METHOCARBAMOL 500 MG PO TABS
500.0000 mg | ORAL_TABLET | Freq: Four times a day (QID) | ORAL | Status: DC | PRN
  Administered 2016-02-22 – 2016-02-23 (×2): 500 mg via ORAL
  Filled 2016-02-21 (×2): qty 1

## 2016-02-21 MED ORDER — FENTANYL CITRATE (PF) 100 MCG/2ML IJ SOLN
INTRAMUSCULAR | Status: DC | PRN
  Administered 2016-02-21: 100 ug via INTRAVENOUS

## 2016-02-21 MED ORDER — BUPIVACAINE-EPINEPHRINE 0.25% -1:200000 IJ SOLN
INTRAMUSCULAR | Status: DC | PRN
  Administered 2016-02-21: 6 mL

## 2016-02-21 MED ORDER — MIDAZOLAM HCL 2 MG/2ML IJ SOLN
INTRAMUSCULAR | Status: AC
  Administered 2016-02-21: 2 mg
  Filled 2016-02-21: qty 2

## 2016-02-21 MED ORDER — LORATADINE 10 MG PO TABS: 10.0000 mg | ORAL_TABLET | Freq: Every day | ORAL | Status: DC | PRN

## 2016-02-21 MED ORDER — PSEUDOEPHEDRINE-GUAIFENESIN ER 60-600 MG PO TB12: 2.0000 | ORAL_TABLET | Freq: Every day | ORAL | Status: DC

## 2016-02-21 MED ORDER — FENTANYL CITRATE (PF) 250 MCG/5ML IJ SOLN
INTRAMUSCULAR | Status: AC
  Filled 2016-02-21: qty 5

## 2016-02-21 MED ORDER — PHENYLEPHRINE HCL 10 MG/ML IJ SOLN
10.0000 mg | INTRAVENOUS | Status: DC | PRN
  Administered 2016-02-21: 40 ug/min via INTRAVENOUS

## 2016-02-21 MED ORDER — DOCUSATE SODIUM 100 MG PO CAPS
100.0000 mg | ORAL_CAPSULE | Freq: Two times a day (BID) | ORAL | Status: DC
  Administered 2016-02-21 – 2016-02-23 (×3): 100 mg via ORAL
  Filled 2016-02-21 (×3): qty 1

## 2016-02-21 MED ORDER — VITAMIN D3 25 MCG (1000 UNIT) PO TABS
2000.0000 [IU] | ORAL_TABLET | Freq: Every day | ORAL | Status: DC
  Administered 2016-02-23: 2000 [IU] via ORAL
  Filled 2016-02-21 (×3): qty 2

## 2016-02-21 MED ORDER — SODIUM CHLORIDE 0.9 % IV SOLN: INTRAVENOUS | Status: DC

## 2016-02-21 MED ORDER — FENTANYL CITRATE (PF) 100 MCG/2ML IJ SOLN
INTRAMUSCULAR | Status: AC
  Administered 2016-02-21: 25 ug via INTRAVENOUS
  Filled 2016-02-21: qty 2

## 2016-02-21 MED ORDER — EPHEDRINE SULFATE 50 MG/ML IJ SOLN
INTRAMUSCULAR | Status: DC | PRN
  Administered 2016-02-21: 5 mg via INTRAVENOUS
  Administered 2016-02-21: 10 mg via INTRAVENOUS

## 2016-02-21 MED ORDER — LIDOCAINE HCL (CARDIAC) 20 MG/ML IV SOLN
INTRAVENOUS | Status: DC | PRN
  Administered 2016-02-21: 20 mg via INTRAVENOUS

## 2016-02-21 MED ORDER — ACETAMINOPHEN 650 MG RE SUPP: 650.0000 mg | Freq: Four times a day (QID) | RECTAL | Status: DC | PRN

## 2016-02-21 MED ORDER — PROPOFOL 10 MG/ML IV BOLUS
INTRAVENOUS | Status: AC
  Filled 2016-02-21: qty 20

## 2016-02-21 MED ORDER — POLYETHYLENE GLYCOL 3350 17 G PO PACK: 17.0000 g | PACK | Freq: Every day | ORAL | Status: DC | PRN

## 2016-02-21 MED ORDER — MENTHOL 3 MG MT LOZG: 1.0000 | LOZENGE | OROMUCOSAL | Status: DC | PRN

## 2016-02-21 MED ORDER — SODIUM CHLORIDE 0.9 % IR SOLN
Status: DC | PRN
  Administered 2016-02-21: 3000 mL

## 2016-02-21 MED ORDER — METOCLOPRAMIDE HCL 5 MG/ML IJ SOLN
5.0000 mg | Freq: Three times a day (TID) | INTRAMUSCULAR | Status: DC | PRN
  Administered 2016-02-22: 10 mg via INTRAVENOUS
  Filled 2016-02-21: qty 2

## 2016-02-21 MED ORDER — PHENOL 1.4 % MT LIQD: 1.0000 | OROMUCOSAL | Status: DC | PRN

## 2016-02-21 MED ORDER — 0.9 % SODIUM CHLORIDE (POUR BTL) OPTIME
TOPICAL | Status: DC | PRN
  Administered 2016-02-21 (×2): 1000 mL

## 2016-02-21 MED ORDER — OXYCODONE HCL 5 MG PO TABS
5.0000 mg | ORAL_TABLET | ORAL | Status: DC | PRN
  Administered 2016-02-21 (×2): 10 mg via ORAL
  Administered 2016-02-22: 5 mg via ORAL
  Administered 2016-02-22 (×2): 10 mg via ORAL
  Administered 2016-02-23 (×2): 5 mg via ORAL
  Filled 2016-02-21: qty 1
  Filled 2016-02-21 (×2): qty 2
  Filled 2016-02-21: qty 1
  Filled 2016-02-21 (×2): qty 2

## 2016-02-21 MED ORDER — LACTATED RINGERS IV SOLN
INTRAVENOUS | Status: DC
  Administered 2016-02-21 (×2): via INTRAVENOUS

## 2016-02-21 MED ORDER — VALACYCLOVIR HCL 500 MG PO TABS: 500.0000 mg | ORAL_TABLET | Freq: Every day | ORAL | Status: DC

## 2016-02-21 MED ORDER — ONDANSETRON HCL 4 MG/2ML IJ SOLN
4.0000 mg | Freq: Four times a day (QID) | INTRAMUSCULAR | Status: DC | PRN
  Filled 2016-02-21: qty 2

## 2016-02-21 MED ORDER — ALPRAZOLAM 0.5 MG PO TABS: 0.5000 mg | ORAL_TABLET | Freq: Two times a day (BID) | ORAL | Status: DC | PRN

## 2016-02-21 MED ORDER — PROMETHAZINE HCL 25 MG/ML IJ SOLN: 6.2500 mg | INTRAMUSCULAR | Status: DC | PRN

## 2016-02-21 MED ORDER — HYDROCORTISONE 5 MG PO TABS
5.0000 mg | ORAL_TABLET | Freq: Every day | ORAL | Status: DC
  Administered 2016-02-21 – 2016-02-22 (×2): 5 mg via ORAL
  Filled 2016-02-21 (×2): qty 1

## 2016-02-21 MED ORDER — SCOPOLAMINE 1 MG/3DAYS TD PT72
1.0000 | MEDICATED_PATCH | TRANSDERMAL | Status: AC
  Administered 2016-02-21: 1 via TRANSDERMAL
  Filled 2016-02-21: qty 1

## 2016-02-21 SURGICAL SUPPLY — 75 items
BIT DRILL 170X2.5X (BIT) IMPLANT
BIT DRILL 5/64X5 DISP (BIT) ×4 IMPLANT
BIT DRL 170X2.5X (BIT) ×2
BLADE SAG 18X100X1.27 (BLADE) ×4 IMPLANT
CAPT SHLDR REVTOTAL 1 ×2 IMPLANT
CLOSURE WOUND 1/2 X4 (GAUZE/BANDAGES/DRESSINGS) ×1
COVER SURGICAL LIGHT HANDLE (MISCELLANEOUS) ×4 IMPLANT
DRAPE IMP U-DRAPE 54X76 (DRAPES) ×8 IMPLANT
DRAPE INCISE IOBAN 66X45 STRL (DRAPES) ×4 IMPLANT
DRAPE ORTHO SPLIT 77X108 STRL (DRAPES) ×8
DRAPE SURG ORHT 6 SPLT 77X108 (DRAPES) ×4 IMPLANT
DRAPE U-SHAPE 47X51 STRL (DRAPES) ×4 IMPLANT
DRAPE X-RAY CASS 24X20 (DRAPES) IMPLANT
DRILL 2.5 (BIT) ×4
DRSG ADAPTIC 3X8 NADH LF (GAUZE/BANDAGES/DRESSINGS) ×4 IMPLANT
DRSG MEPILEX BORDER 4X4 (GAUZE/BANDAGES/DRESSINGS) ×2 IMPLANT
DRSG PAD ABDOMINAL 8X10 ST (GAUZE/BANDAGES/DRESSINGS) ×4 IMPLANT
DURAPREP 26ML APPLICATOR (WOUND CARE) ×4 IMPLANT
ELECT BLADE 4.0 EZ CLEAN MEGAD (MISCELLANEOUS) ×4
ELECT NDL TIP 2.8 STRL (NEEDLE) ×2 IMPLANT
ELECT NEEDLE TIP 2.8 STRL (NEEDLE) ×4 IMPLANT
ELECT REM PT RETURN 9FT ADLT (ELECTROSURGICAL) ×4
ELECTRODE BLDE 4.0 EZ CLN MEGD (MISCELLANEOUS) ×2 IMPLANT
ELECTRODE REM PT RTRN 9FT ADLT (ELECTROSURGICAL) ×2 IMPLANT
GAUZE SPONGE 4X4 12PLY STRL (GAUZE/BANDAGES/DRESSINGS) ×4 IMPLANT
GLOVE BIOGEL PI ORTHO PRO 7.5 (GLOVE) ×2
GLOVE BIOGEL PI ORTHO PRO SZ8 (GLOVE) ×2
GLOVE ORTHO TXT STRL SZ7.5 (GLOVE) ×4 IMPLANT
GLOVE PI ORTHO PRO STRL 7.5 (GLOVE) ×2 IMPLANT
GLOVE PI ORTHO PRO STRL SZ8 (GLOVE) ×2 IMPLANT
GLOVE SURG ORTHO 8.5 STRL (GLOVE) ×4 IMPLANT
GOWN STRL REUS W/ TWL LRG LVL3 (GOWN DISPOSABLE) ×2 IMPLANT
GOWN STRL REUS W/ TWL XL LVL3 (GOWN DISPOSABLE) ×4 IMPLANT
GOWN STRL REUS W/TWL LRG LVL3 (GOWN DISPOSABLE) ×4
GOWN STRL REUS W/TWL XL LVL3 (GOWN DISPOSABLE) ×8
HANDPIECE INTERPULSE COAX TIP (DISPOSABLE)
KIT BASIN OR (CUSTOM PROCEDURE TRAY) ×4 IMPLANT
KIT ROOM TURNOVER OR (KITS) ×4 IMPLANT
MANIFOLD NEPTUNE II (INSTRUMENTS) ×4 IMPLANT
NDL 1/2 CIR MAYO (NEEDLE) ×2 IMPLANT
NDL HYPO 25GX1X1/2 BEV (NEEDLE) ×2 IMPLANT
NDL SUT 6 .5 CRC .975X.05 MAYO (NEEDLE) IMPLANT
NEEDLE 1/2 CIR MAYO (NEEDLE) ×4 IMPLANT
NEEDLE HYPO 25GX1X1/2 BEV (NEEDLE) ×4 IMPLANT
NEEDLE MAYO TAPER (NEEDLE) ×4
NS IRRIG 1000ML POUR BTL (IV SOLUTION) ×6 IMPLANT
PACK SHOULDER (CUSTOM PROCEDURE TRAY) ×4 IMPLANT
PAD ARMBOARD 7.5X6 YLW CONV (MISCELLANEOUS) ×8 IMPLANT
PIN METAGLENE 2.5 (PIN) ×4 IMPLANT
SET HNDPC FAN SPRY TIP SCT (DISPOSABLE) IMPLANT
SLING ARM FOAM STRAP LRG (SOFTGOODS) ×2 IMPLANT
SLING ARM LRG ADULT FOAM STRAP (SOFTGOODS) IMPLANT
SLING ARM MED ADULT FOAM STRAP (SOFTGOODS) IMPLANT
SPONGE GAUZE 4X4 12PLY STER LF (GAUZE/BANDAGES/DRESSINGS) ×2 IMPLANT
SPONGE LAP 18X18 X RAY DECT (DISPOSABLE) IMPLANT
SPONGE LAP 4X18 X RAY DECT (DISPOSABLE) ×4 IMPLANT
STRIP CLOSURE SKIN 1/2X4 (GAUZE/BANDAGES/DRESSINGS) ×3 IMPLANT
SUCTION FRAZIER HANDLE 10FR (MISCELLANEOUS) ×2
SUCTION TUBE FRAZIER 10FR DISP (MISCELLANEOUS) ×2 IMPLANT
SUT ETHILON 4 0 PS 2 18 (SUTURE) ×2 IMPLANT
SUT FIBERWIRE #2 38 T-5 BLUE (SUTURE) ×16
SUT MNCRL AB 4-0 PS2 18 (SUTURE) ×4 IMPLANT
SUT VIC AB 0 CT2 27 (SUTURE) ×2 IMPLANT
SUT VIC AB 2-0 CT1 27 (SUTURE) ×4
SUT VIC AB 2-0 CT1 TAPERPNT 27 (SUTURE) ×2 IMPLANT
SUT VICRYL 0 CT 1 36IN (SUTURE) ×4 IMPLANT
SUTURE FIBERWR #2 38 T-5 BLUE (SUTURE) ×4 IMPLANT
SYR CONTROL 10ML LL (SYRINGE) ×4 IMPLANT
TAPE CLOTH SURG 6X10 WHT LF (GAUZE/BANDAGES/DRESSINGS) ×2 IMPLANT
TOWEL OR 17X24 6PK STRL BLUE (TOWEL DISPOSABLE) ×6 IMPLANT
TOWEL OR 17X26 10 PK STRL BLUE (TOWEL DISPOSABLE) ×4 IMPLANT
TOWER CARTRIDGE SMART MIX (DISPOSABLE) IMPLANT
TRAY FOLEY CATH 16FRSI W/METER (SET/KITS/TRAYS/PACK) IMPLANT
WATER STERILE IRR 1000ML POUR (IV SOLUTION) ×4 IMPLANT
YANKAUER SUCT BULB TIP NO VENT (SUCTIONS) ×4 IMPLANT

## 2016-02-21 NOTE — Transfer of Care (Signed)
Immediate Anesthesia Transfer of Care Note  Patient: Kathryn [name]  Procedure(s) Performed: Procedure(s): RIGHT REVERSE TOTAL SHOULDER ARTHROPLASTY (Right) MINOR IRRIGATION AND DEBRIDEMENT/REPAIR OF LEFT ARM WOUND (Left)  Patient Location: PACU  Anesthesia Type:General  Level of Consciousness: awake, alert , oriented and patient cooperative  Airway & Oxygen Therapy: Patient Spontanous Breathing and Patient connected to nasal cannula oxygen  Post-op Assessment: Report given to RN and Post -op Vital signs reviewed and stable  Post vital signs: Reviewed and stable  Last Vitals:  Filed Vitals:   02/21/16 1040  BP: 116/53  Pulse: 64  Temp: 37.2 C  Resp: 18    Last Pain:  Filed Vitals:   02/21/16 1238  PainSc: 6       Patients Stated Pain Goal: 3 ([id number])  Complications: No apparent anesthesia complications

## 2016-02-21 NOTE — Interval H&P Note (Signed)
History and Physical Interval Note:  02/21/2016 1:00 PM  Kathryn [name]  has presented today for surgery, with the diagnosis of RIGHT SHOULDER ROTATOR CUFF INSUFFICENCY AND OA  The various methods of treatment have been discussed with the patient and family. After consideration of risks, benefits and other options for treatment, the patient has consented to  Procedure(s): RIGHT REVERSE SHOULDER ARTHROPLASTY (Right) as a surgical intervention .  The patient's history has been reviewed, patient examined, no change in status, stable for surgery.  I have reviewed the patient's chart and labs.  Questions were answered to the patient's satisfaction.     Cuthbert Turton,STEVEN R

## 2016-02-21 NOTE — H&P (View-Only) (Signed)
Kathryn [name] is an 60 y.o. female.    Chief Complaint: right shoulder pain and weakness  HPI: Pt is a 60 y.o. female complaining of right shoulder pain for multiple years. Pain had continually increased since the beginning. X-rays in the clinic show end-stage arthritic changes of the right shoulder. Pt has tried various conservative treatments which have failed to alleviate their symptoms, including injections and therapy. Various options are discussed with the patient. Risks, benefits and expectations were discussed with the patient. Patient understand the risks, benefits and expectations and wishes to proceed with surgery.   PCP:  Jana Half  D/C Plans: Home  PMH: Past Medical History  Diagnosis Date  . Chest pain, atypical     had cardiac workup - deemed related to anxiety  . Dyspnea   . Anxiety   . PONV (postoperative nausea and vomiting)   . GERD (gastroesophageal reflux disease)   . H/O hiatal hernia   . H/O dizziness   . Arthritis   . Hepatitis     hepatitis A as a child  . Guillain-Barre syndrome following vaccination   . Herpes simplex     PSH: Past Surgical History  Procedure Laterality Date  . Anterior cervical decomp/discectomy fusion      C5 and C6, Dr. Otilio Connors, MD 2015  . Cholecystectomy    . Appendectomy    . Abdominal hysterectomy    . Rotator cuff repair Right   . Lumbar laminectomy/decompression microdiscectomy Right 06/22/2013    Procedure: RIGHT LUMBAR TWO-THREE DISCECTOMY;  Surgeon: Otilio Connors, MD;  Location: [place] [facility];  Service: Neurosurgery;  Laterality: Right;  Right   . Colonoscopy N/A 09/20/2014    Procedure: COLONOSCOPY;  Surgeon: Rogene Houston, MD;  [facility];  Service: Endoscopy;  Laterality: N/A;  200  . Esophagogastroduodenoscopy N/A 09/20/2014    Procedure: ESOPHAGOGASTRODUODENOSCOPY (EGD);  Surgeon: Rogene Houston, MD;  [facility];  Service: Endoscopy;  Laterality: N/A;  Venia Minks  dilation N/A 09/20/2014    Procedure: Venia Minks DILATION;  Surgeon: Rogene Houston, MD;  [facility];  Service: Endoscopy;  Laterality: N/A;    Social History:  reports that she has quit smoking. She does not have any smokeless tobacco history on file. She reports that she does not drink alcohol or use illicit drugs.  Allergies:  Allergies  Allergen Reactions  . Niacin And Related Shortness Of Breath and Swelling    Neck; skin hot  . Codeine Swelling    tongue    Medications: No current facility-administered medications for this encounter.   Current Outpatient Prescriptions  Medication Sig Dispense Refill  . ALPRAZolam (XANAX) 0.5 MG tablet Take 0.5 mg by mouth as needed for anxiety.     . Biotin 5000 MCG CAPS Take 1 capsule by mouth 2 (two) times daily.     . cholecalciferol (VITAMIN D) 1000 UNITS tablet Take 2,000 Units by mouth daily.     . diclofenac (VOLTAREN) 75 MG EC tablet Take 75 mg by mouth 2 (two) times daily.    Marland Kitchen dicyclomine (BENTYL) 10 MG capsule Take 1 capsule (10 mg total) by mouth 2 (two) times daily. 60 capsule 3  . hydroxypropyl methylcellulose (ISOPTO TEARS) 2.5 % ophthalmic solution Place 2 drops into both eyes 3 (three) times daily as needed (for dry eyes/ allergies).    . loratadine (CLARITIN) 10 MG tablet Take 10 mg by mouth at bedtime.     Marland Kitchen  magnesium oxide (MAG-OX) 400 MG tablet Take 500 mg by mouth every other day.    . pantoprazole (PROTONIX) 40 MG tablet Take 40 mg by mouth 2 (two) times daily.     . pseudoephedrine-guaifenesin (MUCINEX D) 60-600 MG per tablet Take 2 tablets by mouth daily.    . valACYclovir (VALTREX) 500 MG tablet Take 500 mg by mouth daily.        No results found for this or any previous visit (from the past 48 hour(s)). No results found.  ROS: Pain with rom of the right upper extremity  Physical Exam: Alert and oriented 60 y.o. female in no acute distress Cranial nerves 2-12 intact Cervical spine: full rom with no  tenderness, nv intact distally Chest: active breath sounds bilaterally, no wheeze rhonchi or rales Heart: regular rate and rhythm, no murmur Abd: non tender non distended with active bowel sounds Hip is stable with rom  Right shoulder with limited rom and strength due to arthropathy nv intact distally No rashes or edema  Assessment/Plan Assessment: right rotator cuff arthropathy  Plan: Patient will undergo a right reverse total shoulder by Dr. Veverly Fells at [facility]. Risks benefits and expectations were discussed with the patient. Patient understand risks, benefits and expectations and wishes to proceed.

## 2016-02-21 NOTE — Brief Op Note (Signed)
02/21/2016  3:22 PM  PATIENT:  Clover Mealy  60 y.o. female  PRE-OPERATIVE DIAGNOSIS:  RIGHT SHOULDER ROTATOR CUFF INSUFFICENCY AND OA; LEFT ARM TRAUMATIC WOUND  POST-OPERATIVE DIAGNOSIS:  RIGHT SHOULDER ROTATOR CUFF INSUFFICENCY AND OA; LEFT ARM TRAUMATIC WOUND  PROCEDURE:  Procedure(s): RIGHT REVERSE TOTAL SHOULDER ARTHROPLASTY (Right) MINOR IRRIGATION AND DEBRIDEMENT/REPAIR OF LEFT ARM WOUND (Left)   DePuy Delta Xtend shoulder prosthesis  SURGEON:  Surgeon(s) and Role:    * Netta Cedars, MD - Primary  PHYSICIAN ASSISTANT:   ASSISTANTS: Ventura Bruns, PA-C   ANESTHESIA:   regional and general  EBL:  Total I/O In: 1000 [I.V.:1000] Out: 100 [Blood:100]  BLOOD ADMINISTERED:none  DRAINS: none   LOCAL MEDICATIONS USED:  MARCAINE     SPECIMEN:  No Specimen  DISPOSITION OF SPECIMEN:  N/A  COUNTS:  YES  TOURNIQUET:  * No tourniquets in log *  DICTATION: .Other Dictation: Dictation Number [id number]  PLAN OF CARE: Admit to inpatient   PATIENT DISPOSITION:  PACU - hemodynamically stable.   Delay start of Pharmacological VTE agent (>24hrs) due to surgical blood loss or risk of bleeding: not applicable

## 2016-02-21 NOTE — Anesthesia Procedure Notes (Addendum)
Anesthesia Regional Block:  Interscalene brachial plexus block  Pre-Anesthetic Checklist: ,, timeout performed, Correct Patient, Correct Site, Correct Laterality, Correct Procedure, Correct Position, site marked, Risks and benefits discussed,  Surgical consent,  Pre-op evaluation,  At surgeon's request and post-op pain management  Laterality: Right  Prep: chloraprep       Needles:  Injection technique: Single-shot  Needle Type: Echogenic Stimulator Needle     Needle Length: 9cm 9 cm Needle Gauge: 21 and 21 G    Additional Needles:  Procedures: ultrasound guided (picture in chart) and nerve stimulator Interscalene brachial plexus block  Nerve Stimulator or Paresthesia:  Response: deltoid, 0.5 mA,   Additional Responses:   Narrative:  Start time: 02/21/2016 12:52 PM End time: 02/21/2016 1:00 PM Injection made incrementally with aspirations every 5 mL.  Performed by: Personally  Anesthesiologist: Suzette Battiest   Procedure Name: Intubation Date/Time: 02/21/2016 1:25 PM Performed by: Lance Coon Pre-anesthesia Checklist: Patient identified, Emergency Drugs available, Suction available, Patient being monitored and Timeout performed Patient Re-evaluated:Patient Re-evaluated prior to inductionOxygen Delivery Method: Circle system utilized Preoxygenation: Pre-oxygenation with 100% oxygen Intubation Type: IV induction Ventilation: Mask ventilation without difficulty Laryngoscope Size: Miller and 2 Grade View: Grade I Tube type: Oral Tube size: 7.0 mm Number of attempts: 1 Airway Equipment and Method: Stylet Placement Confirmation: ETT inserted through vocal cords under direct vision,  positive ETCO2 and breath sounds checked- equal and bilateral Secured at: 21 cm Tube secured with: Tape Dental Injury: Teeth and Oropharynx as per pre-operative assessment

## 2016-02-21 NOTE — Progress Notes (Signed)
Pt. Has arrived for surgery, she  exposed a (work related ) injury to her posterior L forearm, that is deep & has not been repaired or treated with oral antibiotics. Dr. Ola Spurr into room to see wound, he will communicate the same with Dr. Veverly Fells.

## 2016-02-21 NOTE — Anesthesia Postprocedure Evaluation (Signed)
Anesthesia Post Note  Patient: Kathryn [name]  Procedure(s) Performed: Procedure(s) (LRB): RIGHT REVERSE TOTAL SHOULDER ARTHROPLASTY (Right) MINOR IRRIGATION AND DEBRIDEMENT/REPAIR OF LEFT ARM WOUND (Left)  Patient location during evaluation: PACU Anesthesia Type: General and Regional Level of consciousness: awake and alert Pain management: pain level controlled Vital Signs Assessment: post-procedure vital signs reviewed and stable Respiratory status: spontaneous breathing, nonlabored ventilation, respiratory function stable and patient connected to nasal cannula oxygen Cardiovascular status: blood pressure returned to baseline and stable Postop Assessment: no signs of nausea or vomiting Anesthetic complications: no    Last Vitals:  Filed Vitals:   02/21/16 1800 02/21/16 1825  BP:  122/57  Pulse: 64 81  Temp: 36.7 C 36.7 C  Resp: 12 22    Last Pain:  Filed Vitals:   02/21/16 1832  PainSc: 0-No pain                 Tiajuana Amass

## 2016-02-21 NOTE — Interval H&P Note (Signed)
History and Physical Interval Note:  02/21/2016 1:01 PM  Kathryn [name]  has presented today for surgery, with the diagnosis of RIGHT SHOULDER ROTATOR CUFF INSUFFICENCY AND OA  The various methods of treatment have been discussed with the patient and family. After consideration of risks, benefits and other options for treatment, the patient has consented to  Procedure(s): RIGHT REVERSE SHOULDER ARTHROPLASTY (Right) and LEFT forearm I+D and repair of complex laceration as a surgical intervention .  The patient's history has been reviewed, patient examined, no change in status, stable for surgery.  I have reviewed the patient's chart and labs.  Questions were answered to the patient's satisfaction.    The patient had a traumatic laceration to her left forearm on the dorsal side two days ago.  She has been keep the wound covered.  She denies fevers or chills.  There is a 4 cm deep laceration involving skin, sub cutaneus tissue and muscle fascia. I feel that we can proceed with her right shoulder surgery as there is no evidence of infection (no redness and no cloudy drainage)  The patient understands and agrees with this plan.  Will wash out the wound and loosely close in the OR - added to consent   Detrell Umscheid,STEVEN R

## 2016-02-22 LAB — BASIC METABOLIC PANEL
Anion gap: 9 (ref 5–15)
BUN: 9 mg/dL (ref 6–20)
CHLORIDE: 102 mmol/L (ref 101–111)
CO2: 26 mmol/L (ref 22–32)
Calcium: 8.6 mg/dL — ABNORMAL LOW (ref 8.9–10.3)
Creatinine, Ser: 0.79 mg/dL (ref 0.44–1.00)
GFR calc Af Amer: >60 mL/min (ref >60)
GFR calc non Af Amer: >60 mL/min (ref >60)
GLUCOSE: 106 mg/dL — AB (ref 65–99)
POTASSIUM: 3.7 mmol/L (ref 3.5–5.1)
Sodium: 137 mmol/L (ref 135–145)

## 2016-02-22 LAB — HEMOGLOBIN AND HEMATOCRIT, BLOOD
HEMATOCRIT: 33.3 % — AB (ref 36.0–46.0)
Hemoglobin: 10.8 g/dL — ABNORMAL LOW (ref 12.0–15.0)

## 2016-02-22 MED ORDER — METHOCARBAMOL 500 MG PO TABS: 500.0000 mg | ORAL_TABLET | Freq: Three times a day (TID) | ORAL | Status: DC | PRN

## 2016-02-22 MED ORDER — PROMETHAZINE HCL 12.5 MG PO TABS: 12.5000 mg | ORAL_TABLET | Freq: Four times a day (QID) | ORAL | Status: DC | PRN

## 2016-02-22 MED ORDER — PROMETHAZINE HCL 25 MG/ML IJ SOLN
12.5000 mg | Freq: Four times a day (QID) | INTRAMUSCULAR | Status: DC | PRN
  Administered 2016-02-22: 12.5 mg via INTRAVENOUS
  Filled 2016-02-22: qty 1

## 2016-02-22 MED ORDER — CEPHALEXIN 500 MG PO CAPS: 500.0000 mg | ORAL_CAPSULE | Freq: Three times a day (TID) | ORAL | Status: DC

## 2016-02-22 MED ORDER — OXYCODONE-ACETAMINOPHEN 5-325 MG PO TABS: 1.0000 | ORAL_TABLET | ORAL | Status: DC | PRN

## 2016-02-22 NOTE — Progress Notes (Signed)
Orthopedics Progress Note  Subjective: Patient very nauseated this morning.  Also complaining of pain in the right arm  Objective:  Filed Vitals:   02/22/16 0034 02/22/16 0511  BP: 107/41 103/41  Pulse: 76 71  Temp: 98.4 F (36.9 C) 98.2 F (36.8 C)  Resp: 16 17    General: Awake and alert  Musculoskeletal: NVI R UE including axillary nerve, dressing CDI Neurovascularly intact  Lab Results  Component Value Date   WBC 6.9 02/13/2016   HGB 10.8* 02/22/2016   HCT 33.3* 02/22/2016   MCV 94.9 02/13/2016   PLT 249 02/13/2016       Component Value Date/Time   NA 137 02/22/2016 0440   K 3.7 02/22/2016 0440   CL 102 02/22/2016 0440   CO2 26 02/22/2016 0440   GLUCOSE 106* 02/22/2016 0440   BUN 9 02/22/2016 0440   CREATININE 0.79 02/22/2016 0440   CALCIUM 8.6* 02/22/2016 0440   GFRNONAA >60 02/22/2016 0440   GFRAA >60 02/22/2016 0440    Lab Results  Component Value Date   INR 0.96 06/15/2013    Assessment/Plan: POD #1 s/p Procedure(s): RIGHT REVERSE TOTAL SHOULDER ARTHROPLASTY MINOR IRRIGATION AND DEBRIDEMENT/REPAIR OF LEFT ARM WOUND Possible discharge later today if we can get the nausea and the pain under control  Remo Lipps R. Veverly Fells, MD 02/22/2016 9:20 AM

## 2016-02-22 NOTE — Discharge Summary (Signed)
Physician Discharge Summary   Patient ID: Kathryn [name] MRN: [id number] DOB/AGE: Oct 03, 1955 60 y.o.  Admit date: 02/21/2016 Discharge date: 02/22/2016  Admission Diagnoses:  Active Problems:   S/P shoulder replacement   Discharge Diagnoses:  Same   Surgeries: Procedure(s): RIGHT REVERSE TOTAL SHOULDER ARTHROPLASTY MINOR IRRIGATION AND DEBRIDEMENT/REPAIR OF LEFT ARM WOUND on 02/21/2016   Consultants: OT  Discharged Condition: Stable  Hospital Course: Kathryn [name] is an 60 y.o. female who was admitted 02/21/2016 with a chief complaint of right shoulder pain and left arm wound, and found to have a diagnosis of right shoulder rotator cuff tear arthropathy and new left forearm complex deep laceration.  They were brought to the operating room on 02/21/2016 and underwent the above named procedures.    The patient had an uncomplicated hospital course and was stable for discharge.  Recent vital signs:  Filed Vitals:   02/22/16 0034 02/22/16 0511  BP: 107/41 103/41  Pulse: 76 71  Temp: 98.4 F (36.9 C) 98.2 F (36.8 C)  Resp: 16 17    Recent laboratory studies:  Results for orders placed or performed during the hospital encounter of 02/21/16  Hemoglobin and hematocrit, blood  Result Value Ref Range   Hemoglobin 10.8 (L) 12.0 - 15.0 g/dL   HCT 33.3 (L) 36.0 - [id number] %  Basic metabolic panel  Result Value Ref Range   Sodium 137 135 - 145 mmol/L   Potassium 3.7 3.5 - 5.1 mmol/L   Chloride 102 101 - 111 mmol/L   CO2 26 22 - 32 mmol/L   Glucose, Bld 106 (H) 65 - 99 mg/dL   BUN 9 6 - 20 mg/dL   Creatinine, Ser 0.79 0.44 - 1.00 mg/dL   Calcium 8.6 (L) 8.9 - 10.3 mg/dL   GFR calc non Af Amer >60 >60 mL/min   GFR calc Af Amer >60 >60 mL/min   Anion gap 9 5 - 15    Discharge Medications:     Medication List    TAKE these medications        acetaminophen 325 MG tablet  Commonly known as:  TYLENOL  Take 650 mg by mouth every 6 (six) hours as needed for mild pain.     ALPRAZolam 0.5 MG tablet  Commonly known as:  XANAX  Take 0.5 mg by mouth 2 (two) times daily as needed for anxiety.     cephALEXin 500 MG capsule  Commonly known as:  KEFLEX  Take 1 capsule (500 mg total) by mouth 3 (three) times daily.     cholecalciferol 1000 units tablet  Commonly known as:  VITAMIN D  Take 2,000 Units by mouth daily.     dicyclomine 10 MG capsule  Commonly known as:  BENTYL  Take 1 capsule (10 mg total) by mouth 2 (two) times daily.     hydrocortisone 10 MG tablet  Commonly known as:  CORTEF  Take 5-10 mg by mouth 2 (two) times daily. 10mg  in am, 5mg  in pm     loratadine 10 MG tablet  Commonly known as:  CLARITIN  Take 10 mg by mouth daily as needed for allergies.     methocarbamol 500 MG tablet  Commonly known as:  ROBAXIN  Take 1 tablet (500 mg total) by mouth 3 (three) times daily as needed.     oxyCODONE-acetaminophen 5-325 MG tablet  Commonly known as:  ROXICET  Take 1-2 tablets by mouth every 4 (four) hours as needed for severe pain.  pantoprazole 40 MG tablet  Commonly known as:  PROTONIX  Take 40 mg by mouth daily.     promethazine 12.5 MG tablet  Commonly known as:  PHENERGAN  Take 1 tablet (12.5 mg total) by mouth every 6 (six) hours as needed for nausea or vomiting.     pseudoephedrine-guaifenesin 60-600 MG 12 hr tablet  Commonly known as:  MUCINEX D  Take 2 tablets by mouth daily.     traMADol 50 MG tablet  Commonly known as:  ULTRAM  Take 50 mg by mouth every 6 (six) hours as needed. pain     valACYclovir 1000 MG tablet  Commonly known as:  VALTREX  Take 1,000 mg by mouth daily.     valACYclovir 500 MG tablet  Commonly known as:  VALTREX  Take 500 mg by mouth daily.        Diagnostic Studies: Dg Shoulder Right Port  03-20-16  CLINICAL DATA:  Post RIGHT shoulder replacement EXAM: PORTABLE RIGHT SHOULDER - 2+ VIEW COMPARISON:  Portable exam 1649 hours compared to 01/25/2015 FINDINGS: Interval reverse RIGHT shoulder  arthroplasty. Prosthetic components are identified without fracture dislocation. No periprosthetic lucency. Mild degenerative changes RIGHT AC joint. IMPRESSION: Post reverse RIGHT shoulder arthroplasty without acute bony abnormalities. Electronically Signed   By: Lavonia Dana M.D.   On: 20-Mar-2016 17:07    Disposition: 01-Home or Self Care        Follow-up Information    Follow up with Kiya Eno,STEVEN R, MD. Call in 1 week.   Specialty:  Orthopedic Surgery   Why:  Patient to see Merla Riches PA-C in the office in one week for a wound check   Contact information:   [address] [place] [address] [phone]        Signed: Augustin Schooling 02/22/2016, 9:30 AM

## 2016-02-22 NOTE — Op Note (Signed)
Kathryn [name], DELSORDO NO.:  [id number]  MEDICAL RECORD NO.:  [id number]  LOCATION:  5N02C                        FACILITY:  [facility]  PHYSICIAN:  Doran Heater. Veverly Fells, M.D. DATE OF BIRTH:  [birth year]  DATE OF PROCEDURE:  02/21/2016 DATE OF DISCHARGE:                              OPERATIVE REPORT   PREOPERATIVE DIAGNOSES: 1. Right shoulder rotator cuff tear arthropathy. 2. Left arm complex laceration, deep to muscle fascia.  POSTOPERATIVE DIAGNOSES: 1. Right shoulder rotator cuff tear arthropathy. 2. Left arm complex laceration, deep to muscle fascia.  PROCEDURE PERFORMED:  Irrigation and debridement and primary wound closure, left forearm injury, separate procedure too.  ATTENDING SURGEON:  Doran Heater. Veverly Fells, M.D.  ASSISTANT:  Abbott Pao. Dixon, PA-C.  ANESTHESIA:  General anesthesia was used plus interscalene block for the right shoulder.  COUNTS:  Instrument counts were correct.  COMPLICATIONS:  There were no complications.  ANTIBIOTICS:  Perioperative antibiotics were given.  FLUID REPLACEMENT:  1000 mL crystalloid.  BLOOD LOSS:  Minimal.  INDICATIONS:  The patient is a 60 year old female with history of worsening right shoulder pain secondary to full-thickness and retracted rotator cuff tears involving supraspinatus and infraspinatus muscles and tendons.  The patient has non-repairable situation.  She does have fixed superior head migration and is beginning to have a loss of fixed fulcrum mechanics with severe pain including night pain and rest pain.  The patient has failed conservative management over a 37-month course including therapy, injections, modification of activities.  We discussed options to include superior capsule reconstruction, which were fearful of the situation with arthritis in her joint and the other option was reverse shoulder replacement.  So, the patient fully understands reverse shoulder and not provide strength away from the body  or up overhead, but would provide significant pain relief and that is really what the patient is looking for at this point given that she can hardly sleep with pain some nights.  The patient elected to proceed with surgical management to remove, eliminate pain and restore function.  Informed consent obtained.  DESCRIPTION OF PROCEDURE:  After an adequate level of anesthesia was achieved, the patient was positioned in the modified beach-chair position.  Right shoulder was correctly identified, sterilely prepped and draped in usual manner.  Time-out was called.  We initiated surgery using standard deltopectoral approach starting at the coracoid process, extending down to the anterior humerus.  Dissection down through the subcutaneous tissues, identified cephalic vein, took it laterally with the deltoid, pectoralis taken medially.  Conjoint tendon identified and retracted medially.  Subscapularis released subperiosteally from the lesser tuberosity and tagged for repair at the end.  We released the inferior capsule, progressively externally rotating.  We then tenodesed the biceps through the pectoralis tendon to stabilize that.  We clipped the remaining biceps.  Supraspinatus and infraspinatus were gone.  We extended the shoulder and externally rotated the humeral head came out of the wound.  We went ahead and entered the proximal humerus with a 6- mm reamer, reamed up to size 10.  We used an intramedullary head resection guide and resected the head in the appropriate height with 10 degrees of retroversion.  We then  went ahead and removed the remaining osteophytes with a rongeur.  We then milled the metaphysis for the epi-1 right component for the DePuy Delta Xtend prosthesis.  We then placed the size 10 stem with the epi-1 right metaphyseal component and set on the 0 setting and placed in 10 degrees of retroversion.  We impacted that into place and subluxed the humerus posteriorly and then  did a 360- degree capsular removal and glenoid labral removal.  There was minimal cartilage remaining on the humeral head and on the glenoid face, evidence of advanced wear.  We then went ahead and clipped away the remaining superior labrum and the biceps, found our center point of our glenoid with a guidepin, reamed for the metaglene and then drilled out the central post for the metaglene impacting the metaglene in position. We placed a 48 screw inferiorly at 30 at the base of the coracoid and 18 nonlocked posteriorly.  We locked in the locking screws, we could not get a screw anteriorly due to the narrowness of the glenoid.  We had nice support for the metaglene.  We then went ahead and used a 38 standard glenosphere and screwed that and impacted that in position.  We had nice stable fit, made sure that the axillary nerve was out of the way.  We then went ahead and selected a 38+ 3 poly and reduced the shoulder with a nice pop and excellent stability.  We then removed the trial components on the humeral side, irrigated thoroughly, placed #2 FiberWire suture in lesser tuberosity for repair of the subscap.  Next, we went ahead and used impaction grafting technique after thorough irrigation and then placed the 10 HA-coated stem with epi-1 right metaphyseal component, set on the 0 setting and this entire component placed in 10 degrees of retroversion.  We used impaction grafting with available bone graft and had very very stable fit.  We did not have to use any cement.  At this point, we selected the real 38+ 3 poly, impacted that in position, reduced the shoulder, again nice pop reducing.  Conjoined tendon tied, everything moved together as a unit with the pull on the elbow and then with external rotation, no gapping. We then repaired the subscapularis anatomically advancing slightly to just across the biceps groove.  We were able to get about 20-30 degrees of external rotation after the  repair, so little bit tighter than we like, but everything was very compliant, flexible and I feel for competent to stretch that out.  She will need that given her young age at 46.  She will need that teres minor and the subscap to provide the best function possible.  We irrigated thoroughly, closed the deltopectoral interval with 0 Vicryl suture followed by 2-0 Vicryl for subcutaneous closure and 4-0 Monocryl for skin.  Steri-Strips applied followed by sterile dressing.  The patient tolerated the surgery well.     Doran Heater. Veverly Fells, M.D.     SRN/MEDQ  D:  02/21/2016  T:  02/22/2016  Job:  [id number]

## 2016-02-22 NOTE — Discharge Instructions (Signed)
Ice to the shoulder as much as possible.  Keep the incision clean and dry and covered for one week then ok to get it wet in the shower.  Keep the left arm dressing covered and dry - do not get it wet until [facility] checks it in the office.  Ok to remove the sling while in the home and rest the right arm on a pillow.  When up and around use the pillow.  Follow up within one week in the office with Merla Riches PA-C  [phone]  Call for appointment

## 2016-02-23 NOTE — Evaluation (Signed)
Occupational Therapy Evaluation Patient Details Name: Kathryn [name] MRN: [id number] DOB: 01/03/1956 Today's Date: 02/23/2016    History of Present Illness This 60 y.o. female admitted for elective Rt reverse total shoulder replacement    Clinical Impression   Patient evaluated by Occupational Therapy with no further acute OT needs identified. All education has been completed and the patient has no further questions. All education completed.  Pt able to return demonstration of info.  See below for any follow-up Occupational Therapy or equipment needs. OT is signing off. Thank you for this referral.      Follow Up Recommendations  No OT follow up;Supervision/Assistance - 24 hour (Pt to follow up with MD re: follow up OT )    Equipment Recommendations  None recommended by OT    Recommendations for Other Services       Precautions / Restrictions Precautions Precautions: Shoulder Type of Shoulder Precautions: not specified in orders  Restrictions Weight Bearing Restrictions: Yes RUE Weight Bearing: Non weight bearing      Mobility Bed Mobility Overal bed mobility: Needs Assistance Bed Mobility: Supine to Sit;Sit to Supine     Supine to sit: Min guard;HOB elevated Sit to supine: Min guard;HOB elevated   General bed mobility comments: Pt has adjustable  bed at home   Transfers Overall transfer level: Modified independent Equipment used: None                  Balance Overall balance assessment: No apparent balance deficits (not formally assessed)                                          ADL Overall ADL's : Needs assistance/impaired                                             Vision     Perception     Praxis      Pertinent Vitals/Pain Pain Assessment: 0-10 Pain Score: 5  Pain Location: Rt shoulder  Pain Descriptors / Indicators: Aching;Operative site guarding;Grimacing Pain Intervention(s): Monitored during  session;Premedicated before session;Repositioned;Ice applied     Hand Dominance Left   Extremity/Trunk Assessment Upper Extremity Assessment Upper Extremity Assessment: RUE deficits/detail RUE Deficits / Details: Rt reverse total shoulder    Lower Extremity Assessment Lower Extremity Assessment: Overall WFL for tasks assessed   Cervical / Trunk Assessment Cervical / Trunk Assessment: Normal   Communication Communication Communication: No difficulties   Cognition Arousal/Alertness: Awake/alert Behavior During Therapy: WFL for tasks assessed/performed Overall Cognitive Status: Within Functional Limits for tasks assessed                     General Comments       Exercises Exercises: Shoulder     Shoulder Instructions Shoulder Instructions Donning/doffing shirt without moving shoulder: Minimal assistance;Caregiver independent with task Method for sponge bathing under operated UE: Supervision/safety Donning/doffing sling/immobilizer: Minimal assistance;Caregiver independent with task Correct positioning of sling/immobilizer: Supervision/safety ROM for elbow, wrist and digits of operated UE: Supervision/safety Sling wearing schedule (on at all times/off for ADL's): Modified independent Proper positioning of operated UE when showering: Supervision/safety Positioning of UE while sleeping: Supervision/safety    Home Living Family/patient expects to be discharged to:: Private residence Living Arrangements: Spouse/significant other Available  Help at Discharge: Family;Available 24 hours/day Type of Home: House Home Access: Stairs to enter     Home Layout: One level     Bathroom Shower/Tub: Occupational psychologist: Standard Bathroom Accessibility: Yes   Home Equipment: Shower seat - built in;Hand held shower head          Prior Functioning/Environment Level of Independence: Independent             OT Diagnosis: Generalized weakness;Acute  pain   OT Problem List: Decreased strength;Decreased range of motion;Decreased activity tolerance;Decreased knowledge of precautions;Impaired UE functional use;Pain   OT Treatment/Interventions:      OT Goals(Current goals can be found in the care plan section) Acute Rehab OT Goals Patient Stated Goal: to go home  OT Goal Formulation: All assessment and education complete, DC therapy  OT Frequency:     Barriers to D/C:            Co-evaluation              End of Session Equipment Utilized During Treatment: Other (comment) (sling ) Nurse Communication: Mobility status  Activity Tolerance: Patient tolerated treatment well Patient left: in bed;with call bell/phone within reach;with family/visitor present   Time: [id number] OT Time Calculation (min): 25 min Charges:  OT General Charges $OT Visit: 1 Procedure OT Evaluation $OT Eval Low Complexity: 1 Procedure OT Treatments $Self Care/Home Management : 8-22 mins G-Codes:    Sahej Hauswirth M March 18, 2016, 10:45 AM

## 2016-02-23 NOTE — Progress Notes (Signed)
Subjective: 2 Days Post-Op Procedure(s) (LRB): RIGHT REVERSE TOTAL SHOULDER ARTHROPLASTY (Right) MINOR IRRIGATION AND DEBRIDEMENT/REPAIR OF LEFT ARM WOUND (Left) Patient reports pain as mild. Nausea improved. Tolerating PO's. Reports a good night. Denies SOB,CP, or f/c. Husband at bedside.    Objective: Vital signs in last 24 hours: Temp:  [98.4 F (36.9 C)-99.5 F (37.5 C)] 99.5 F (37.5 C) (06/18 0523) Pulse Rate:  [69-84] 70 (06/18 0523) Resp:  [16-17] 16 (06/18 0523) BP: (117-124)/(48-51) 119/51 mmHg (06/18 0523) SpO2:  [97 %-99 %] 97 % (06/18 0523)  Intake/Output from previous day: 06/17 0701 - 06/18 0700 In: 120 [P.O.:120] Out: -  Intake/Output this shift:     Recent Labs  02/22/16 0440  HGB 10.8*    Recent Labs  02/22/16 0440  HCT 33.3*    Recent Labs  02/22/16 0440  NA 137  K 3.7  CL 102  CO2 26  BUN 9  CREATININE 0.79  GLUCOSE 106*  CALCIUM 8.6*   No results for input(s): LABPT, INR in the last 72 hours.  Well nourished. Alert and oriented x3. RRR, Lungs clear, BS x4. Abdomen soft and non tender. Right Calf soft and non tender. Right shoulder dressing C/D/I. No DVT signs. Compartment soft. No signs of infection.  Right UE grossly neurovascular intact.  Assessment/Plan: 2 Days Post-Op Procedure(s) (LRB): RIGHT REVERSE TOTAL SHOULDER ARTHROPLASTY (Right) MINOR IRRIGATION AND DEBRIDEMENT/REPAIR OF LEFT ARM WOUND (Left) D/c home with family Follow up in office Follow instructions Wear sling as directed OT this am   Lynette Topete L 02/23/2016, 9:12 AM

## 2016-02-24 ENCOUNTER — Encounter (HOSPITAL_COMMUNITY): Payer: Self-pay | Admitting: Orthopedic Surgery

## 2016-08-05 ENCOUNTER — Other Ambulatory Visit: Payer: Self-pay | Admitting: Dermatology

## 2016-08-18 ENCOUNTER — Encounter (INDEPENDENT_AMBULATORY_CARE_PROVIDER_SITE_OTHER): Payer: Self-pay | Admitting: Internal Medicine

## 2016-08-18 ENCOUNTER — Ambulatory Visit (INDEPENDENT_AMBULATORY_CARE_PROVIDER_SITE_OTHER): Payer: BLUE CROSS/BLUE SHIELD | Admitting: Internal Medicine

## 2016-08-18 ENCOUNTER — Encounter (INDEPENDENT_AMBULATORY_CARE_PROVIDER_SITE_OTHER): Payer: Self-pay

## 2016-08-18 VITALS — BP 110/70 | HR 64 | Temp 97.7°F | Ht 66.0 in | Wt 152.7 lb

## 2016-08-18 DIAGNOSIS — K449 Diaphragmatic hernia without obstruction or gangrene: Secondary | ICD-10-CM

## 2016-08-18 DIAGNOSIS — K219 Gastro-esophageal reflux disease without esophagitis: Secondary | ICD-10-CM | POA: Diagnosis not present

## 2016-08-18 NOTE — Progress Notes (Signed)
Subjective:    Patient ID: Kathryn [name], female    DOB: 09-19-1955, 60 y.o.   MRN: [id number]  HPI Presents today with c/o that she would like hiatal hernia surgery.  She tells me she has acid reflux frequently. She has pressure in her chest.  She says her symptoms worse since she has gained weight.  Appetite is good. She usually has a BM x 1 every 3 days. No melena or BRRB.  No abdominal pain.  Hx of adrenal insufficiency and maintained on Hydrocortisone.     09/20/2014   EGD, ED & Colonoscopy  Indications:  Patient is 60 year old Caucasian female who has chronic GERD and now presents with over 6 month history of dysphagia to solids. She feels heartburn is well controlled with therapy. She also complains of lower abdominal pain and diarrhea. She used to have constipation but now her symptoms have flipped. Last colonoscopy was in 2009. Family history is negative for CRC in first-degree relative.                                       EGD findings; Small sliding hiatal hernia without ring or stricture formation. Antral gastritis. Biopsy taken. Esophagus dilated by passing 54 French Maloney dilator but no mucosal disruption induced.  Colonoscopy findings; Small polyp at appendiceal stump was ablated via cold biopsy. No evidence of endoscopic colitis. Random biopsies taken from mucosa of sigmoid colon looking for microscopic or collagenous colitis.    Review of Systems     Past Medical History:  Diagnosis Date  . Adrenal insufficiency ([place])    occured following steroid shoulder injections ~ spring 2017  . Anxiety    pt. reports that she has RLS- takes Xanax for calming her self so she can settle & sleep   . Arthritis    shoulder & back, knees & leg    . Chest pain, atypical    had cardiac workup - deemed related to anxiety  . Chronic bronchitis ([place])    "less since I quit smoking" (02/21/2016)  . Chronic lower back pain   . Dyspnea    related to spray paint that is put in   beer cans at her place of employment.  Pt. also reports that she has anxiety also that leads to SOB., Pt. reports her Mother just died a week ago    . GERD (gastroesophageal reflux disease)   . Guillain-Barre syndrome following vaccination ([place]) 1970's   post swine flu shot, hosp. for treatment for 1 month   . H/O dizziness   . H/O hiatal hernia   . Hepatitis    hepatitis as a child; "got it from a little girl at school"  . Herpes simplex   . History of Guillain-Barre syndrome ~ 1978   "from the swine flu shot"  . PONV (postoperative nausea and vomiting)   . Restless leg syndrome   . Walking pneumonia ~ 2011    Past Surgical History:  Procedure Laterality Date  . ANTERIOR CERVICAL DECOMP/DISCECTOMY FUSION  2000 & 2015   two times ago- C5 and C6, Dr. Otilio Connors, MD 2015  . APPENDECTOMY  1980s  . BACK SURGERY    . CHOLECYSTECTOMY OPEN  1980s  . COLONOSCOPY N/A 09/20/2014   Procedure: COLONOSCOPY;  Surgeon: Rogene Houston, MD;  [facility];  Service: Endoscopy;  Laterality: N/A;  200  .  ESOPHAGOGASTRODUODENOSCOPY N/A 09/20/2014   Procedure: ESOPHAGOGASTRODUODENOSCOPY (EGD);  Surgeon: Rogene Houston, MD;  [facility];  Service: Endoscopy;  Laterality: N/A;  . INCISION AND DRAINAGE OF WOUND Left 02/21/2016   forearm, minor  . LUMBAR LAMINECTOMY/DECOMPRESSION MICRODISCECTOMY Right 06/22/2013   Procedure: RIGHT LUMBAR TWO-THREE DISCECTOMY;  Surgeon: Otilio Connors, MD;  Location: [facility];  Service: Neurosurgery;  Laterality: Right;  Right   . MALONEY DILATION N/A 09/20/2014   Procedure: Venia Minks DILATION;  Surgeon: Rogene Houston, MD;  [facility];  Service: Endoscopy;  Laterality: N/A;  . MINOR IRRIGATION AND DEBRIDEMENT OF WOUND Left 02/21/2016   Procedure: MINOR IRRIGATION AND DEBRIDEMENT/REPAIR OF LEFT ARM WOUND;  Surgeon: Netta Cedars, MD;  Location: [facility];  Service: Orthopedics;  Laterality: Left;  . REVERSE SHOULDER ARTHROPLASTY Right  02/21/2016   Procedure: RIGHT REVERSE TOTAL SHOULDER ARTHROPLASTY;  Surgeon: Netta Cedars, MD;  Location: [facility];  Service: Orthopedics;  Laterality: Right;  . REVERSE TOTAL SHOULDER ARTHROPLASTY Right 02/21/2016  . SHOULDER ARTHROSCOPY W/ ROTATOR CUFF REPAIR Right 1990s  . VAGINAL HYSTERECTOMY      Allergies  Allergen Reactions  . Niacin And Related Shortness Of Breath and Swelling    Neck; skin hot  . Codeine Swelling    tongue  . Zinc Swelling    Current Outpatient Prescriptions on File Prior to Visit  Medication Sig Dispense Refill  . acetaminophen (TYLENOL) 325 MG tablet Take 650 mg by mouth every 6 (six) hours as needed for mild pain.    Marland Kitchen ALPRAZolam (XANAX) 0.5 MG tablet Take 0.5 mg by mouth 2 (two) times daily as needed for anxiety.     . cholecalciferol (VITAMIN D) 1000 UNITS tablet Take 2,000 Units by mouth daily.     Marland Kitchen dicyclomine (BENTYL) 10 MG capsule Take 1 capsule (10 mg total) by mouth 2 (two) times daily. (Patient taking differently: Take 10 mg by mouth as needed. ) 60 capsule 3  . hydrocortisone (CORTEF) 10 MG tablet Take 5 mg by mouth 3 (three) times daily. 10mg  in am, 5mg  in pm  3  . pantoprazole (PROTONIX) 40 MG tablet Take 40 mg by mouth daily.     . promethazine (PHENERGAN) 12.5 MG tablet Take 1 tablet (12.5 mg total) by mouth every 6 (six) hours as needed for nausea or vomiting. 30 tablet 0  . traMADol (ULTRAM) 50 MG tablet Take 50 mg by mouth every 6 (six) hours as needed. pain  0  . valACYclovir (VALTREX) 500 MG tablet Take 500 mg by mouth daily.       No current facility-administered medications on file prior to visit.     Objective:   Physical Exam Blood pressure 110/70, pulse 64, temperature 97.7 F (36.5 C), height 5\' 6"  (1.676 m), weight 152 lb 11.2 oz (69.3 kg). Alert and oriented. Skin warm and dry. Oral mucosa is moist.   . Sclera anicteric, conjunctivae is pink. Thyroid not enlarged. No cervical lymphadenopathy. Lungs clear. Heart regular rate and  rhythm.  Abdomen is soft. Bowel sounds are positive. No hepatomegaly. No abdominal masses felt. No tenderness.  No edema to lower extremities.         Assessment & Plan:  Hx of small hiatal hernia. Requesting referral to surgeon. Referral to Dr Jackolyn Confer

## 2016-08-18 NOTE — Patient Instructions (Addendum)
Referral of Dr. Jackolyn Confer

## 2016-09-09 ENCOUNTER — Other Ambulatory Visit: Payer: Self-pay | Admitting: General Surgery

## 2016-09-09 DIAGNOSIS — K219 Gastro-esophageal reflux disease without esophagitis: Secondary | ICD-10-CM

## 2016-09-09 DIAGNOSIS — K449 Diaphragmatic hernia without obstruction or gangrene: Principal | ICD-10-CM

## 2016-09-11 ENCOUNTER — Telehealth: Payer: Self-pay

## 2016-09-11 NOTE — Telephone Encounter (Signed)
[facility] Dr Zella Richer requesting an Esophageal Manometry with 24 hour Ph study.  Patient is scheduled for 09/23/16 at 10:30 am. These tests are not performed at [facility]. Message left for the patient. Information mailed to the patient's listed address.

## 2016-09-15 ENCOUNTER — Other Ambulatory Visit: Payer: Self-pay | Admitting: General Surgery

## 2016-09-15 ENCOUNTER — Ambulatory Visit (HOSPITAL_COMMUNITY)
Admission: RE | Admit: 2016-09-15 | Discharge: 2016-09-15 | Disposition: A | Discharge status: Home / Self Care | Payer: BLUE CROSS/BLUE SHIELD | Source: Ambulatory Visit | Attending: General Surgery | Admitting: General Surgery

## 2016-09-15 DIAGNOSIS — K449 Diaphragmatic hernia without obstruction or gangrene: Principal | ICD-10-CM

## 2016-09-15 DIAGNOSIS — K224 Dyskinesia of esophagus: Secondary | ICD-10-CM | POA: Insufficient documentation

## 2016-09-15 DIAGNOSIS — K219 Gastro-esophageal reflux disease without esophagitis: Secondary | ICD-10-CM | POA: Insufficient documentation

## 2016-09-15 NOTE — Telephone Encounter (Signed)
Information left on her voicemail cell phone.

## 2016-09-23 ENCOUNTER — Encounter (HOSPITAL_COMMUNITY)
Admission: RE | Disposition: A | Discharge status: Home / Self Care | Payer: Self-pay | Source: Ambulatory Visit | Attending: Gastroenterology

## 2016-09-23 ENCOUNTER — Ambulatory Visit (HOSPITAL_COMMUNITY)
Admission: RE | Admit: 2016-09-23 | Discharge: 2016-09-23 | Disposition: A | Discharge status: Home / Self Care | Payer: BLUE CROSS/BLUE SHIELD | Source: Ambulatory Visit | Attending: Gastroenterology | Admitting: Gastroenterology

## 2016-09-23 DIAGNOSIS — K219 Gastro-esophageal reflux disease without esophagitis: Secondary | ICD-10-CM

## 2016-09-23 DIAGNOSIS — K449 Diaphragmatic hernia without obstruction or gangrene: Secondary | ICD-10-CM | POA: Diagnosis not present

## 2016-09-23 HISTORY — PX: ESOPHAGEAL MANOMETRY: SHX5429

## 2016-09-23 HISTORY — PX: 24 HOUR PH STUDY: SHX5419

## 2016-09-23 SURGERY — MANOMETRY, ESOPHAGUS
Anesthesia: LOCAL

## 2016-09-23 MED ORDER — LIDOCAINE VISCOUS 2 % MT SOLN
OROMUCOSAL | Status: AC
  Filled 2016-09-23: qty 15

## 2016-09-23 SURGICAL SUPPLY — 2 items
FACESHIELD LNG OPTICON STERILE (SAFETY) IMPLANT
GLOVE BIO SURGEON STRL SZ8 (GLOVE) ×4 IMPLANT

## 2016-09-23 NOTE — Progress Notes (Signed)
Esophageal Manometry  And 24 hr Ph impedence study done per protocol.  Small blood noted with entry.  Pt tolerated both procedures well without any other complication.  Pt not on blood thinners.  Report to be sent to Dr. Silverio Decamp.  Laverta Baltimore, RN

## 2016-09-24 ENCOUNTER — Encounter (HOSPITAL_COMMUNITY): Payer: Self-pay | Admitting: Gastroenterology

## 2016-09-29 ENCOUNTER — Encounter: Payer: Self-pay | Admitting: *Deleted

## 2016-09-30 ENCOUNTER — Ambulatory Visit (INDEPENDENT_AMBULATORY_CARE_PROVIDER_SITE_OTHER): Payer: BLUE CROSS/BLUE SHIELD | Admitting: Adult Health

## 2016-09-30 ENCOUNTER — Encounter: Payer: Self-pay | Admitting: Adult Health

## 2016-09-30 VITALS — BP 122/60 | HR 68 | Ht 66.25 in | Wt 157.5 lb

## 2016-09-30 DIAGNOSIS — G8929 Other chronic pain: Secondary | ICD-10-CM

## 2016-09-30 DIAGNOSIS — Z01411 Encounter for gynecological examination (general) (routine) with abnormal findings: Secondary | ICD-10-CM | POA: Diagnosis not present

## 2016-09-30 DIAGNOSIS — Z1211 Encounter for screening for malignant neoplasm of colon: Secondary | ICD-10-CM

## 2016-09-30 DIAGNOSIS — Z01419 Encounter for gynecological examination (general) (routine) without abnormal findings: Secondary | ICD-10-CM

## 2016-09-30 DIAGNOSIS — Z1212 Encounter for screening for malignant neoplasm of rectum: Secondary | ICD-10-CM

## 2016-09-30 DIAGNOSIS — N3946 Mixed incontinence: Secondary | ICD-10-CM

## 2016-09-30 DIAGNOSIS — R1031 Right lower quadrant pain: Secondary | ICD-10-CM | POA: Diagnosis not present

## 2016-09-30 LAB — HEMOCCULT GUIAC POC 1CARD (OFFICE): FECAL OCCULT BLD: NEGATIVE

## 2016-09-30 NOTE — Patient Instructions (Signed)
Hernia, Adult A hernia is the bulging of an organ or tissue through a weak spot in the muscles of the abdomen (abdominal wall). Hernias develop most often near the navel or groin. There are many kinds of hernias. Common kinds include:  Femoral hernia. This kind of hernia develops under the groin in the upper thigh area.  Inguinal hernia. This kind of hernia develops in the groin or scrotum.  Umbilical hernia. This kind of hernia develops near the navel.  Hiatal hernia. This kind of hernia causes part of the stomach to be pushed up into the chest.  Incisional hernia. This kind of hernia bulges through a scar from an abdominal surgery. What are the causes? This condition may be caused by:  Heavy lifting.  Coughing over a long period of time.  Straining to have a bowel movement.  An incision made during an abdominal surgery.  A birth defect (congenital defect).  Excess weight or obesity.  Smoking.  Poor nutrition.  Cystic fibrosis.  Excess fluid in the abdomen.  Undescended testicles. What are the signs or symptoms? Symptoms of a hernia include:  A lump on the abdomen. This is the first sign of a hernia. The lump may become more obvious with standing, straining, or coughing. It may get bigger over time if it is not treated or if the condition causing it is not treated.  Pain. A hernia is usually painless, but it may become painful over time if treatment is delayed. The pain is usually dull and may get worse with standing or lifting heavy objects. Sometimes a hernia gets tightly squeezed in the weak spot (strangulated) or stuck there (incarcerated) and causes additional symptoms. These symptoms may include:  Vomiting.  Nausea.  Constipation.  Irritability. How is this diagnosed? A hernia may be diagnosed with:  A physical exam. During the exam your health care provider may ask you to cough or to make a specific movement, because a hernia is usually more visible when  you move.  Imaging tests. These can include:  X-rays.  Ultrasound.  CT scan. How is this treated? A hernia that is small and painless may not need to be treated. A hernia that is large or painful may be treated with surgery. Inguinal hernias may be treated with surgery to prevent incarceration or strangulation. Strangulated hernias are always treated with surgery, because lack of blood to the trapped organ or tissue can cause it to die. Surgery to treat a hernia involves pushing the bulge back into place and repairing the weak part of the abdomen. Follow these instructions at home:  Avoid straining.  Do not lift anything heavier than 10 lb (4.5 kg).  Lift with your leg muscles, not your back muscles. This helps avoid strain.  When coughing, try to cough gently.  Prevent constipation. Constipation leads to straining with bowel movements, which can make a hernia worse or cause a hernia repair to break down. You can prevent constipation by:  Eating a high-fiber diet that includes plenty of fruits and vegetables.  Drinking enough fluids to keep your urine clear or pale yellow. Aim to drink 6-8 glasses of water per day.  Using a stool softener as directed by your health care provider.  Lose weight, if you are overweight.  Do not use any tobacco products, including cigarettes, chewing tobacco, or electronic cigarettes. If you need help quitting, ask your health care provider.  Keep all follow-up visits as directed by your health care provider. This is important. Your  health care provider may need to monitor your condition. Contact a health care provider if:  You have swelling, redness, and pain in the affected area.  Your bowel habits change. Get help right away if:  You have a fever.  You have abdominal pain that is getting worse.  You feel nauseous or you vomit.  You cannot push the hernia back in place by gently pressing on it while you are lying down.  The  hernia:  Changes in shape or size.  Is stuck outside the abdomen.  Becomes discolored.  Feels hard or tender. This information is not intended to replace advice given to you by your health care provider. Make sure you discuss any questions you have with your health care provider. Document Released: 08/24/2005 Document Revised: 01/22/2016 Document Reviewed: 07/04/2014 Elsevier Interactive Patient Education  2017 [employer]. Pelvic Pain, Female Female pelvic pain can be caused by many different things and start from a variety of places. Pelvic pain refers to pain that is located in the lower half of the abdomen and between your hips. The pain may occur over a short period of time (acute) or may be reoccurring (chronic). The cause of pelvic pain may be related to disorders affecting the female reproductive organs (gynecologic), but it may also be related to the bladder, kidney stones, an intestinal complication, or muscle or skeletal problems. Getting help right away for pelvic pain is important, especially if there has been severe, sharp, or a sudden onset of unusual pain. It is also important to get help right away because some types of pelvic pain can be life threatening.  CAUSES  Below are only some of the causes of pelvic pain. The causes of pelvic pain can be in one of several categories.   Gynecologic.  Pelvic inflammatory disease.  Sexually transmitted infection.  Ovarian cyst or a twisted ovarian ligament (ovarian torsion).  Uterine lining that grows outside the uterus (endometriosis).  Fibroids, cysts, or tumors.  Ovulation.  Pregnancy.  Pregnancy that occurs outside the uterus (ectopic pregnancy).  Miscarriage.  Labor.  Abruption of the placenta or ruptured uterus.  Infection.  Uterine infection (endometritis).  Bladder infection.  Diverticulitis.  Miscarriage related to a uterine infection (septic abortion).  Bladder.  Inflammation of the bladder  (cystitis).  Kidney stone(s).  Gastrointestinal.  Constipation.  Diverticulitis.  Neurologic.  Trauma.  Feeling pelvic pain because of mental or emotional causes (psychosomatic).  Cancers of the bowel or pelvis. EVALUATION  Your caregiver will want to take a careful history of your concerns. This includes recent changes in your health, a careful gynecologic history of your periods (menses), and a sexual history. Obtaining your family history and medical history is also important. Your caregiver may suggest a pelvic exam. A pelvic exam will help identify the location and severity of the pain. It also helps in the evaluation of which organ system may be involved. In order to identify the cause of the pelvic pain and be properly treated, your caregiver may order tests. These tests may include:   A pregnancy test.  Pelvic ultrasonography.  An X-ray exam of the abdomen.  A urinalysis or evaluation of vaginal discharge.  Blood tests. HOME CARE INSTRUCTIONS   Only take over-the-counter or prescription medicines for pain, discomfort, or fever as directed by your caregiver.   Rest as directed by your caregiver.   Eat a balanced diet.   Drink enough fluids to make your urine clear or pale yellow, or as directed.  Avoid sexual intercourse if it causes pain.   Apply warm or cold compresses to the lower abdomen depending on which one helps the pain.   Avoid stressful situations.   Keep a journal of your pelvic pain. Write down when it started, where the pain is located, and if there are things that seem to be associated with the pain, such as food or your menstrual cycle.  Follow up with your caregiver as directed.  SEEK MEDICAL CARE IF:  Your medicine does not help your pain.  You have abnormal vaginal discharge. SEEK IMMEDIATE MEDICAL CARE IF:   You have heavy bleeding from the vagina.   Your pelvic pain increases.   You feel light-headed or faint.   You  have chills.   You have pain with urination or blood in your urine.   You have uncontrolled diarrhea or vomiting.   You have a fever or persistent symptoms for more than 3 days.  You have a fever and your symptoms suddenly get worse.   You are being physically or sexually abused. This information is not intended to replace advice given to you by your health care provider. Make sure you discuss any questions you have with your health care provider. Document Released: 07/21/2004 Document Revised: 05/15/2015 Document Reviewed: 06/14/2015 Elsevier Interactive Patient Education  2017 [employer].

## 2016-09-30 NOTE — Progress Notes (Signed)
Subjective:     Patient ID: Kathryn [name], female   DOB: 07/22/1956, 61 y.o.   MRN: [id number]  HPI [name] is a 61 year old white female, married,in for pelvic exam, she was told to get pap, but has had vaginal hysterectomy for prolapse years ago.Pelvic exam hurt.She has some urinary leakage and has pain in RLQ for years and feels ?bulge if stands long time.  She has had back surgery and is on cortef now and has gained weight and retains fluid(has appt with Dr Bubba Camp in February).She is not sexually active. PCP is [place].  Review of Systems +UI +RLQ pain for years, ?bulge Not having sex, husband can't Weight gain and fluid retention from cortef. Reviewed past medical,surgical, social and family history. Reviewed medications and allergies.     Objective:   Physical Exam BP 122/60 (BP Location: Left Arm, Patient Position: Sitting, Cuff Size: Normal)   Pulse 68   Ht 5' 6.25" (1.683 m)   Wt 157 lb 8 oz (71.4 kg)   BMI 25.23 kg/m    PHQ 2 score 0. Skin warm and dry.Pelvic: external genitalia is normal in appearance no lesions, vagina:is pale and with loss of moisture and rugae,urethra has no lesions or masses noted, cervix and uterus are absent, adnexa: no masses, RLQ  tenderness noted. Bladder is non tender and no masses felt. On rectal exam has good tone, no masses or hemorrhoids and hemoccult is negative.Expalined since she does not have cervix and did not have hysterectomy for cancer, does not need pap smear. On abdomen exam, soft and non tender,no bulge felt today, but explained to her how would feel if hernia was there, and if has bulge and pain to go to ER.  Assessment:     1. Visit for pelvic exam   2. Chronic RLQ pain   3. Mixed stress and urge urinary incontinence   4. Screening for colorectal cancer       Plan:     Return in 1 week for GYN [place] to assess RLQ pain Review handouts on pelvic pain and hernia

## 2016-10-07 ENCOUNTER — Telehealth: Payer: Self-pay | Admitting: Adult Health

## 2016-10-07 ENCOUNTER — Other Ambulatory Visit: Payer: BLUE CROSS/BLUE SHIELD

## 2016-10-07 ENCOUNTER — Ambulatory Visit (INDEPENDENT_AMBULATORY_CARE_PROVIDER_SITE_OTHER): Payer: BLUE CROSS/BLUE SHIELD

## 2016-10-07 DIAGNOSIS — R102 Pelvic and perineal pain: Secondary | ICD-10-CM | POA: Diagnosis not present

## 2016-10-07 DIAGNOSIS — R1031 Right lower quadrant pain: Secondary | ICD-10-CM | POA: Diagnosis not present

## 2016-10-07 DIAGNOSIS — G8929 Other chronic pain: Secondary | ICD-10-CM

## 2016-10-07 DIAGNOSIS — K219 Gastro-esophageal reflux disease without esophagitis: Secondary | ICD-10-CM

## 2016-10-07 DIAGNOSIS — K449 Diaphragmatic hernia without obstruction or gangrene: Secondary | ICD-10-CM

## 2016-10-07 NOTE — Progress Notes (Signed)
PELVIC US TA/TV: Normal vaginal cuff,normal ovaries bilat,no free fluid,bilat pelvic pain during ultrasound,ovaries appear mobile

## 2016-10-07 NOTE — Telephone Encounter (Signed)
Pt aware [place] looked normal, ovaries are tiny, but may have adhesions, can watch for now but if pain increases call for appt with MD

## 2016-10-29 ENCOUNTER — Other Ambulatory Visit (HOSPITAL_COMMUNITY): Payer: Self-pay | Admitting: Family Medicine

## 2016-10-29 DIAGNOSIS — Z1231 Encounter for screening mammogram for malignant neoplasm of breast: Secondary | ICD-10-CM

## 2016-11-05 ENCOUNTER — Ambulatory Visit (HOSPITAL_COMMUNITY)
Admission: RE | Admit: 2016-11-05 | Discharge: 2016-11-05 | Disposition: A | Discharge status: Home / Self Care | Payer: BLUE CROSS/BLUE SHIELD | Source: Ambulatory Visit | Attending: Family Medicine | Admitting: Family Medicine

## 2016-11-05 ENCOUNTER — Ambulatory Visit: Payer: Self-pay | Admitting: General Surgery

## 2016-11-05 DIAGNOSIS — Z1231 Encounter for screening mammogram for malignant neoplasm of breast: Secondary | ICD-10-CM | POA: Insufficient documentation

## 2016-11-05 NOTE — H&P (Signed)
Kathryn [name] 10/26/2016 3:37 PM Location: [facility] Patient #: [id number] DOB: 10/05/1955 Married / Language: English / Race: White Female   History of Present Illness (Vieva Brummitt J. Krystyl Cannell MD; 10/26/2016 4:32 PM) The patient is a 60 year old female.  Note:She presents today for preoperative visit. Upper GI demonstrated some reflux but no obvious hiatal hernia. 24-hour pH study was abnormal with an elevated score. Manometry demonstrated normal motility but decreased lower esophageal sphincter pressure at relaxation. Her husband is here with her. She is interested in having the surgery. She is going to see Dr. Balan regarding her adrenal insufficiency at the end of this month.  She had significant problems with postoperative nausea and vomiting after her shoulder surgery. However, she did not have these problems with her prior back surgery.  Allergies (April Staton, CMA; 10/26/2016 3:37 PM) Niacin (Antihyperlipidemic) *ANTIHYPERLIPIDEMICS*  Shortness of breath, swellling. Codeine Phosphate *ANALGESICS - OPIOID*  Swelling. Zinc *MINERALS & ELECTROLYTES*  Swelling.  Medication History (April Staton, CMA; 10/26/2016 3:37 PM) Tylenol (325MG Tablet, Oral as needed) Active. Xanax (0.5MG Tablet, Oral as needed) Active. Bentyl (10MG Capsule, Oral as needed) Active. Cortef (10MG Tablet, Oral daily) Active. Protonix (40MG Tablet DR, Oral daily) Active. Phenergan (12.5MG Tablet, Oral as needed) Active. Valtrex (500MG Tablet, Oral daily) Active. Medications Reconciled  Vitals (April Staton CMA; 10/26/2016 3:37 PM) 10/26/2016 3:37 PM Weight: 162.25 lb Height: 67in Body Surface Area: 1.85 m Body Mass Index: 25.41 kg/m  Temp.: 97.3F(Oral)  Pulse: 76 (Regular)  BP: 130/70 (Sitting, Left Arm, Standard)       Physical Exam (Laker Thompson J. Jax Abdelrahman MD; 10/26/2016 4:31 PM) The physical exam findings are as follows: Note:GENERAL APPEARANCE: WDWN in NAD.  Pleasant and cooperative.  EARS, NOSE, MOUTH THROAT: [place]/AT external ears: no lesions or deformities external nose: no lesions or deformities hearing: grossly normal lips: moist, no deformities EYES external: conjunctiva, lids, sclerae normal pupils: equal, round  CV ascultation: RRR, no murmur   RESP auscultation: breath sounds equal and clear respiratory effort: normal  GASTROINTESTINAL abdomen: Soft, non-tender, non-distended, no masses liver and spleen: not enlarged. hernia: none present scar: RUQ scar  PSYCHIATRIC alertness and orientation: normal mood/affect/behavior: normal judgement and insight: normal    Assessment & Plan (Autry Droege J. Berenis Corter MD; 10/26/2016 4:33 PM) HIATAL HERNIA WITH GERD (K21.9) Impression: Upper GI did not demonstrate an obvious hiatal hernia but other test results suggested. She does have low LES pressure and relaxation. She since her stated proceeding with the operation.  Plan: We discussed laparoscopic Nissen fundoplication and hiatal hernia repair. I have explained the procedure, risks, and aftercare. Risks include but are not limited to bleeding, infection, wound problems, anesthesia, dysphagia, injury to esophagus/stomach/liver/spleen/intestine,gas bloat, diarrhea, failure to resolve symptoms.  Strict dietary restriction were explained as well as changes in lifestyle.  She seems to understand. As for her adrenal insufficiency, Dr. Balan, her endocrinologist, recommends hydrocortisone 50 mg IV every 8 hours intraoperatively and recommends stress dose steroid after the surgery until she is pain free.  Adarsh Mundorf, M.D/   

## 2016-11-23 NOTE — Patient Instructions (Signed)
Kathryn [name]  11/23/2016   Your procedure is scheduled on: 11/27/16  Report to [facility] Main  Entrance take [place]  elevators to 3rd floor to  [place] at    1145 AM.  Call this number if you have problems the morning of surgery [phone]   Remember: ONLY 1 PERSON MAY GO WITH YOU TO SHORT STAY TO GET  READY MORNING OF Kathryn [name].  Do not eat food or drink liquids :After Midnight.     Take these medicines the morning of surgery with A SIP OF WATER:  Xanax if needed, protonix, valtrex                                You may not have any metal on your body including hair pins and              piercings  Do not wear jewelry, makeup, lotions, powders or perfumes, deodorant             Do not wear nail polish.  Do not shave  48 hours prior to surgery.                Do not bring valuables to the hospital. [place].  Contacts, dentures or bridgework may not be worn into surgery.  Leave suitcase in the car. After surgery it may be brought to your room.                 Please read over the following fact sheets you were given: _____________________________________________________________________             [facility] - Preparing for Surgery Before surgery, you can play an important role.  Because skin is not sterile, your skin needs to be as free of germs as possible.  You can reduce the number of germs on your skin by washing with CHG (chlorahexidine gluconate) soap before surgery.  CHG is an antiseptic cleaner which kills germs and bonds with the skin to continue killing germs even after washing. Please DO NOT use if you have an allergy to CHG or antibacterial soaps.  If your skin becomes reddened/irritated stop using the CHG and inform your nurse when you arrive at Short Stay. Do not shave (including legs and underarms) for at least 48 hours prior to the first CHG shower.  You may shave your  face/neck. Please follow these instructions carefully:  1.  Shower with CHG Soap the night before surgery and the  morning of Surgery.  2.  If you choose to wash your hair, wash your hair first as usual with your  normal  shampoo.  3.  After you shampoo, rinse your hair and body thoroughly to remove the  shampoo.                           4.  Use CHG as you would any other liquid soap.  You can apply chg directly  to the skin and wash                       Gently with a scrungie or clean washcloth.  5.  Apply the CHG  Soap to your body ONLY FROM THE NECK DOWN.   Do not use on face/ open                           Wound or open sores. Avoid contact with eyes, ears mouth and genitals (private parts).                       Wash face,  Genitals (private parts) with your normal soap.             6.  Wash thoroughly, paying special attention to the area where your surgery  will be performed.  7.  Thoroughly rinse your body with warm water from the neck down.  8.  DO NOT shower/wash with your normal soap after using and rinsing off  the CHG Soap.                9.  Pat yourself dry with a clean towel.            10.  Wear clean pajamas.            11.  Place clean sheets on your bed the night of your first shower and do not  sleep with pets. Day of Surgery : Do not apply any lotions/deodorants the morning of surgery.  Please wear clean clothes to the hospital/surgery center.  FAILURE TO FOLLOW THESE INSTRUCTIONS MAY RESULT IN THE CANCELLATION OF YOUR SURGERY PATIENT SIGNATURE_________________________________  NURSE SIGNATURE__________________________________  ________________________________________________________________________  WHAT IS A BLOOD TRANSFUSION? Blood Transfusion Information  A transfusion is the replacement of blood or some of its parts. Blood is made up of multiple cells which provide different functions.  Red blood cells carry oxygen and are used for blood loss  replacement.  White blood cells fight against infection.  Platelets control bleeding.  Plasma helps clot blood.  Other blood products are available for specialized needs, such as hemophilia or other clotting disorders. BEFORE THE TRANSFUSION  Who gives blood for transfusions?   Healthy volunteers who are fully evaluated to make sure their blood is safe. This is blood bank blood. Transfusion therapy is the safest it has ever been in the practice of medicine. Before blood is taken from a donor, a complete history is taken to make sure that person has no history of diseases nor engages in risky social behavior (examples are intravenous drug use or sexual activity with multiple partners). The donor's travel history is screened to minimize risk of transmitting infections, such as malaria. The donated blood is tested for signs of infectious diseases, such as HIV and hepatitis. The blood is then tested to be sure it is compatible with you in order to minimize the chance of a transfusion reaction. If you or a relative donates blood, this is often done in anticipation of surgery and is not appropriate for emergency situations. It takes many days to process the donated blood. RISKS AND COMPLICATIONS Although transfusion therapy is very safe and saves many lives, the main dangers of transfusion include:   Getting an infectious disease.  Developing a transfusion reaction. This is an allergic reaction to something in the blood you were given. Every precaution is taken to prevent this. The decision to have a blood transfusion has been considered carefully by your caregiver before blood is given. Blood is not given unless the benefits outweigh the risks. AFTER THE TRANSFUSION  Right after receiving a blood transfusion, you  will usually feel much better and more energetic. This is especially true if your red blood cells have gotten low (anemic). The transfusion raises the level of the red blood cells which  carry oxygen, and this usually causes an energy increase.  The nurse administering the transfusion will monitor you carefully for complications. HOME CARE INSTRUCTIONS  No special instructions are needed after a transfusion. You may find your energy is better. Speak with your caregiver about any limitations on activity for underlying diseases you may have. SEEK MEDICAL CARE IF:   Your condition is not improving after your transfusion.  You develop redness or irritation at the intravenous (IV) site. SEEK IMMEDIATE MEDICAL CARE IF:  Any of the following symptoms occur over the next 12 hours:  Shaking chills.  You have a temperature by mouth above 102 F (38.9 C), not controlled by medicine.  Chest, back, or muscle pain.  People around you feel you are not acting correctly or are confused.  Shortness of breath or difficulty breathing.  Dizziness and fainting.  You get a rash or develop hives.  You have a decrease in urine output.  Your urine turns a dark color or changes to pink, red, or brown. Any of the following symptoms occur over the next 10 days:  You have a temperature by mouth above 102 F (38.9 C), not controlled by medicine.  Shortness of breath.  Weakness after normal activity.  The white part of the eye turns yellow (jaundice).  You have a decrease in the amount of urine or are urinating less often.  Your urine turns a dark color or changes to pink, red, or brown. Document Released: 08/21/2000 Document Revised: 11/16/2011 Document Reviewed: 04/09/2008 [facility] Patient Information 2014 [place], [place].  _______________________________________________________________________

## 2016-11-24 ENCOUNTER — Encounter (HOSPITAL_COMMUNITY)
Admission: RE | Admit: 2016-11-24 | Discharge: 2016-11-24 | Disposition: A | Discharge status: Home / Self Care | Payer: BLUE CROSS/BLUE SHIELD | Source: Ambulatory Visit | Attending: General Surgery | Admitting: General Surgery

## 2016-11-24 ENCOUNTER — Encounter (HOSPITAL_COMMUNITY): Payer: Self-pay

## 2016-11-24 HISTORY — DX: Pneumonia, unspecified organism: J18.9

## 2016-11-24 HISTORY — DX: Unspecified asthma, uncomplicated: J45.909

## 2016-11-24 LAB — ABO/RH: ABO/RH(D): AB NEG

## 2016-11-24 LAB — CBC WITH DIFFERENTIAL/PLATELET
BASOS ABS: 0 10*3/uL (ref 0.0–0.1)
Basophils Relative: 0 %
Eosinophils Absolute: 0.1 10*3/uL (ref 0.0–0.7)
Eosinophils Relative: 2 %
HEMATOCRIT: 39.2 % (ref 36.0–46.0)
HEMOGLOBIN: 13.4 g/dL (ref 12.0–15.0)
LYMPHS PCT: 31 %
Lymphs Abs: 2 10*3/uL (ref 0.7–4.0)
MCH: 32 pg (ref 26.0–34.0)
MCHC: 34.2 g/dL (ref 30.0–36.0)
MCV: 93.6 fL (ref 78.0–100.0)
MONO ABS: 0.6 10*3/uL (ref 0.1–1.0)
Monocytes Relative: 10 %
NEUTROS ABS: 3.8 10*3/uL (ref 1.7–7.7)
Neutrophils Relative %: 57 %
Platelets: 282 10*3/uL (ref 150–400)
RBC: 4.19 MIL/uL (ref 3.87–5.11)
RDW: 13 % (ref 11.5–15.5)
WBC: 6.6 10*3/uL (ref 4.0–10.5)

## 2016-11-24 LAB — COMPREHENSIVE METABOLIC PANEL
ALK PHOS: 90 U/L (ref 38–126)
ALT: 61 U/L — AB (ref 14–54)
AST: 47 U/L — ABNORMAL HIGH (ref 15–41)
Albumin: 4.4 g/dL (ref 3.5–5.0)
Anion gap: 7 (ref 5–15)
BILIRUBIN TOTAL: 0.6 mg/dL (ref 0.3–1.2)
BUN: 12 mg/dL (ref 6–20)
CALCIUM: 9.7 mg/dL (ref 8.9–10.3)
CO2: 28 mmol/L (ref 22–32)
CREATININE: 0.83 mg/dL (ref 0.44–1.00)
Chloride: 106 mmol/L (ref 101–111)
GFR calc Af Amer: >60 mL/min (ref >60)
Glucose, Bld: 106 mg/dL — ABNORMAL HIGH (ref 65–99)
Potassium: 4.4 mmol/L (ref 3.5–5.1)
Sodium: 141 mmol/L (ref 135–145)
TOTAL PROTEIN: 7.7 g/dL (ref 6.5–8.1)

## 2016-11-27 ENCOUNTER — Encounter (HOSPITAL_COMMUNITY)
Admission: RE | Disposition: A | Discharge status: Home / Self Care | Payer: Self-pay | Source: Ambulatory Visit | Attending: General Surgery

## 2016-11-27 ENCOUNTER — Encounter (HOSPITAL_COMMUNITY): Payer: Self-pay | Admitting: *Deleted

## 2016-11-27 ENCOUNTER — Ambulatory Visit (HOSPITAL_COMMUNITY): Payer: BLUE CROSS/BLUE SHIELD | Admitting: Anesthesiology

## 2016-11-27 ENCOUNTER — Inpatient Hospital Stay (HOSPITAL_COMMUNITY)
Admission: RE | Admit: 2016-11-27 | Discharge: 2016-11-29 | DRG: 327 | Disposition: A | Discharge status: Home / Self Care | Payer: BLUE CROSS/BLUE SHIELD | Source: Ambulatory Visit | Attending: General Surgery | Admitting: General Surgery

## 2016-11-27 DIAGNOSIS — J45909 Unspecified asthma, uncomplicated: Secondary | ICD-10-CM | POA: Diagnosis present

## 2016-11-27 DIAGNOSIS — M1990 Unspecified osteoarthritis, unspecified site: Secondary | ICD-10-CM | POA: Diagnosis present

## 2016-11-27 DIAGNOSIS — K449 Diaphragmatic hernia without obstruction or gangrene: Secondary | ICD-10-CM | POA: Diagnosis present

## 2016-11-27 DIAGNOSIS — E2740 Unspecified adrenocortical insufficiency: Secondary | ICD-10-CM | POA: Diagnosis present

## 2016-11-27 DIAGNOSIS — K219 Gastro-esophageal reflux disease without esophagitis: Secondary | ICD-10-CM | POA: Diagnosis present

## 2016-11-27 DIAGNOSIS — Z885 Allergy status to narcotic agent status: Secondary | ICD-10-CM

## 2016-11-27 DIAGNOSIS — Z79899 Other long term (current) drug therapy: Secondary | ICD-10-CM | POA: Diagnosis not present

## 2016-11-27 DIAGNOSIS — Z888 Allergy status to other drugs, medicaments and biological substances status: Secondary | ICD-10-CM | POA: Diagnosis not present

## 2016-11-27 DIAGNOSIS — F419 Anxiety disorder, unspecified: Secondary | ICD-10-CM | POA: Diagnosis present

## 2016-11-27 DIAGNOSIS — Z87891 Personal history of nicotine dependence: Secondary | ICD-10-CM | POA: Diagnosis not present

## 2016-11-27 DIAGNOSIS — Z7952 Long term (current) use of systemic steroids: Secondary | ICD-10-CM | POA: Diagnosis not present

## 2016-11-27 HISTORY — PX: HIATAL HERNIA REPAIR: SHX195

## 2016-11-27 LAB — CBC
HEMATOCRIT: 35.7 % — AB (ref 36.0–46.0)
Hemoglobin: 12.4 g/dL (ref 12.0–15.0)
MCH: 31.6 pg (ref 26.0–34.0)
MCHC: 34.7 g/dL (ref 30.0–36.0)
MCV: 91.1 fL (ref 78.0–100.0)
PLATELETS: 243 10*3/uL (ref 150–400)
RBC: 3.92 MIL/uL (ref 3.87–5.11)
RDW: 12.9 % (ref 11.5–15.5)
WBC: 15.4 10*3/uL — ABNORMAL HIGH (ref 4.0–10.5)

## 2016-11-27 LAB — TYPE AND SCREEN
ABO/RH(D): AB NEG
Antibody Screen: NEGATIVE

## 2016-11-27 LAB — CREATININE, SERUM
CREATININE: 0.83 mg/dL (ref 0.44–1.00)
GFR calc non Af Amer: >60 mL/min (ref >60)

## 2016-11-27 SURGERY — REPAIR, HERNIA, HIATAL, LAPAROSCOPIC
Anesthesia: General | Site: Abdomen

## 2016-11-27 MED ORDER — HYDROCORTISONE NA SUCCINATE PF 100 MG IJ SOLR
INTRAMUSCULAR | Status: DC | PRN
  Administered 2016-11-27: 100 mg via INTRAVENOUS

## 2016-11-27 MED ORDER — ROCURONIUM BROMIDE 50 MG/5ML IV SOSY
PREFILLED_SYRINGE | INTRAVENOUS | Status: AC
  Filled 2016-11-27: qty 5

## 2016-11-27 MED ORDER — HYDROMORPHONE HCL 1 MG/ML IJ SOLN
INTRAMUSCULAR | Status: AC
  Filled 2016-11-27: qty 1

## 2016-11-27 MED ORDER — LIDOCAINE 2% (20 MG/ML) 5 ML SYRINGE
INTRAMUSCULAR | Status: DC | PRN
  Administered 2016-11-27: 75 mg via INTRAVENOUS

## 2016-11-27 MED ORDER — SUFENTANIL CITRATE 50 MCG/ML IV SOLN
INTRAVENOUS | Status: DC | PRN
  Administered 2016-11-27 (×5): 10 ug via INTRAVENOUS

## 2016-11-27 MED ORDER — BUPIVACAINE HCL (PF) 0.5 % IJ SOLN
INTRAMUSCULAR | Status: AC
Start: 2016-11-27 — End: 2016-11-27
  Filled 2016-11-27: qty 30

## 2016-11-27 MED ORDER — LACTATED RINGERS IR SOLN
Status: DC | PRN
  Administered 2016-11-27: 1000 mL

## 2016-11-27 MED ORDER — DEXAMETHASONE SODIUM PHOSPHATE 10 MG/ML IJ SOLN
INTRAMUSCULAR | Status: AC
  Filled 2016-11-27: qty 1

## 2016-11-27 MED ORDER — ENOXAPARIN SODIUM 40 MG/0.4ML ~~LOC~~ SOLN
40.0000 mg | SUBCUTANEOUS | Status: DC
  Filled 2016-11-27: qty 0.4

## 2016-11-27 MED ORDER — PROPOFOL 10 MG/ML IV BOLUS
INTRAVENOUS | Status: AC
  Filled 2016-11-27: qty 20

## 2016-11-27 MED ORDER — SCOPOLAMINE 1 MG/3DAYS TD PT72
MEDICATED_PATCH | TRANSDERMAL | Status: AC
  Filled 2016-11-27: qty 1

## 2016-11-27 MED ORDER — ONDANSETRON HCL 4 MG/2ML IJ SOLN
INTRAMUSCULAR | Status: DC | PRN
  Administered 2016-11-27: 4 mg via INTRAVENOUS

## 2016-11-27 MED ORDER — BUPIVACAINE HCL (PF) 0.5 % IJ SOLN
INTRAMUSCULAR | Status: DC | PRN
  Administered 2016-11-27: 15 mL

## 2016-11-27 MED ORDER — METHOCARBAMOL 500 MG PO TABS: 500.0000 mg | ORAL_TABLET | Freq: Four times a day (QID) | ORAL | Status: DC | PRN

## 2016-11-27 MED ORDER — ONDANSETRON HCL 4 MG/2ML IJ SOLN
INTRAMUSCULAR | Status: AC
  Filled 2016-11-27: qty 2

## 2016-11-27 MED ORDER — SCOPOLAMINE 1 MG/3DAYS TD PT72
1.0000 | MEDICATED_PATCH | Freq: Once | TRANSDERMAL | Status: DC
  Administered 2016-11-27: 1.5 mg via TRANSDERMAL

## 2016-11-27 MED ORDER — PROMETHAZINE HCL 25 MG/ML IJ SOLN
INTRAMUSCULAR | Status: AC
  Filled 2016-11-27: qty 1

## 2016-11-27 MED ORDER — HYDROMORPHONE HCL 1 MG/ML IJ SOLN
INTRAMUSCULAR | Status: AC
  Administered 2016-11-27: 0.25 mg via INTRAVENOUS
  Filled 2016-11-27: qty 1

## 2016-11-27 MED ORDER — PROMETHAZINE HCL 25 MG/ML IJ SOLN
6.2500 mg | INTRAMUSCULAR | Status: DC | PRN
  Administered 2016-11-27: 6.25 mg via INTRAVENOUS

## 2016-11-27 MED ORDER — PROPOFOL 10 MG/ML IV BOLUS
INTRAVENOUS | Status: DC | PRN
Start: 2016-11-27 — End: 2016-11-27
  Administered 2016-11-27: 130 mg via INTRAVENOUS
  Administered 2016-11-27: 20 mg via INTRAVENOUS

## 2016-11-27 MED ORDER — ONDANSETRON HCL 4 MG/2ML IJ SOLN: 4.0000 mg | INTRAMUSCULAR | Status: DC | PRN

## 2016-11-27 MED ORDER — PANTOPRAZOLE SODIUM 40 MG IV SOLR
40.0000 mg | Freq: Every day | INTRAVENOUS | Status: DC
  Administered 2016-11-27: 40 mg via INTRAVENOUS
  Filled 2016-11-27: qty 40

## 2016-11-27 MED ORDER — LORAZEPAM 2 MG/ML IJ SOLN: 0.5000 mg | Freq: Four times a day (QID) | INTRAMUSCULAR | Status: DC | PRN

## 2016-11-27 MED ORDER — HYDROCORTISONE NA SUCCINATE PF 100 MG IJ SOLR
50.0000 mg | Freq: Three times a day (TID) | INTRAMUSCULAR | Status: DC
  Administered 2016-11-27 – 2016-11-29 (×5): 50 mg via INTRAVENOUS
  Filled 2016-11-27 (×6): qty 1

## 2016-11-27 MED ORDER — HYDROCORTISONE NA SUCCINATE PF 100 MG IJ SOLR
INTRAMUSCULAR | Status: AC
  Filled 2016-11-27: qty 2

## 2016-11-27 MED ORDER — LACTATED RINGERS IV SOLN: INTRAVENOUS | Status: DC

## 2016-11-27 MED ORDER — SODIUM CHLORIDE 0.9 % IV SOLN
25.0000 mg | INTRAVENOUS | Status: DC | PRN
  Filled 2016-11-27: qty 0.5

## 2016-11-27 MED ORDER — MEPERIDINE HCL 50 MG/ML IJ SOLN: 6.2500 mg | INTRAMUSCULAR | Status: DC | PRN

## 2016-11-27 MED ORDER — SUFENTANIL CITRATE 50 MCG/ML IV SOLN
INTRAVENOUS | Status: AC
  Filled 2016-11-27: qty 1

## 2016-11-27 MED ORDER — LIDOCAINE 2% (20 MG/ML) 5 ML SYRINGE
INTRAMUSCULAR | Status: AC
  Filled 2016-11-27: qty 5

## 2016-11-27 MED ORDER — ROCURONIUM BROMIDE 10 MG/ML (PF) SYRINGE
PREFILLED_SYRINGE | INTRAVENOUS | Status: DC | PRN
  Administered 2016-11-27 (×3): 10 mg via INTRAVENOUS
  Administered 2016-11-27: 50 mg via INTRAVENOUS
  Administered 2016-11-27: 10 mg via INTRAVENOUS

## 2016-11-27 MED ORDER — PROPOFOL 10 MG/ML IV BOLUS
INTRAVENOUS | Status: AC
  Filled 2016-11-27: qty 60

## 2016-11-27 MED ORDER — SUGAMMADEX SODIUM 200 MG/2ML IV SOLN
INTRAVENOUS | Status: DC | PRN
Start: 2016-11-27 — End: 2016-11-27
  Administered 2016-11-27: 200 mg via INTRAVENOUS

## 2016-11-27 MED ORDER — ONDANSETRON HCL 4 MG/2ML IJ SOLN
4.0000 mg | INTRAMUSCULAR | Status: AC
  Administered 2016-11-27 – 2016-11-28 (×3): 4 mg via INTRAVENOUS
  Filled 2016-11-27 (×3): qty 2

## 2016-11-27 MED ORDER — KCL IN DEXTROSE-NACL 20-5-0.9 MEQ/L-%-% IV SOLN
INTRAVENOUS | Status: DC
  Administered 2016-11-27 – 2016-11-28 (×3): via INTRAVENOUS
  Filled 2016-11-27 (×4): qty 1000

## 2016-11-27 MED ORDER — HYDROMORPHONE HCL 1 MG/ML IJ SOLN
0.2500 mg | INTRAMUSCULAR | Status: DC | PRN
  Administered 2016-11-27: 0.25 mg via INTRAVENOUS

## 2016-11-27 MED ORDER — SUCCINYLCHOLINE CHLORIDE 200 MG/10ML IV SOSY
PREFILLED_SYRINGE | INTRAVENOUS | Status: AC
  Filled 2016-11-27: qty 10

## 2016-11-27 MED ORDER — BUPIVACAINE-EPINEPHRINE (PF) 0.5% -1:200000 IJ SOLN
INTRAMUSCULAR | Status: AC
  Filled 2016-11-27: qty 30

## 2016-11-27 MED ORDER — PROMETHAZINE HCL 25 MG/ML IJ SOLN: 12.5000 mg | INTRAMUSCULAR | Status: DC | PRN

## 2016-11-27 MED ORDER — MIDAZOLAM HCL 2 MG/2ML IJ SOLN
INTRAMUSCULAR | Status: DC | PRN
  Administered 2016-11-27: 2 mg via INTRAVENOUS

## 2016-11-27 MED ORDER — CEFAZOLIN SODIUM-DEXTROSE 2-4 GM/100ML-% IV SOLN
2.0000 g | INTRAVENOUS | Status: AC
  Administered 2016-11-27: 2 g via INTRAVENOUS
  Filled 2016-11-27 (×2): qty 100

## 2016-11-27 MED ORDER — SUGAMMADEX SODIUM 200 MG/2ML IV SOLN
INTRAVENOUS | Status: AC
  Filled 2016-11-27: qty 2

## 2016-11-27 MED ORDER — SODIUM CHLORIDE 0.9 % IJ SOLN
INTRAMUSCULAR | Status: AC
  Filled 2016-11-27: qty 10

## 2016-11-27 MED ORDER — PROPOFOL 500 MG/50ML IV EMUL
INTRAVENOUS | Status: DC | PRN
  Administered 2016-11-27: 150 ug/kg/min via INTRAVENOUS

## 2016-11-27 MED ORDER — MORPHINE SULFATE (PF) 4 MG/ML IV SOLN
2.0000 mg | INTRAVENOUS | Status: DC | PRN
  Administered 2016-11-28: 4 mg via INTRAVENOUS
  Administered 2016-11-28 (×2): 2 mg via INTRAVENOUS
  Administered 2016-11-29: 4 mg via INTRAVENOUS
  Filled 2016-11-27 (×4): qty 1

## 2016-11-27 MED ORDER — MIDAZOLAM HCL 2 MG/2ML IJ SOLN
INTRAMUSCULAR | Status: AC
  Filled 2016-11-27: qty 2

## 2016-11-27 MED ORDER — DEXAMETHASONE SODIUM PHOSPHATE 10 MG/ML IJ SOLN
INTRAMUSCULAR | Status: DC | PRN
  Administered 2016-11-27: 4 mg via INTRAVENOUS

## 2016-11-27 MED ORDER — CEFAZOLIN SODIUM-DEXTROSE 2-4 GM/100ML-% IV SOLN
2.0000 g | Freq: Three times a day (TID) | INTRAVENOUS | Status: AC
  Administered 2016-11-27: 2 g via INTRAVENOUS
  Filled 2016-11-27: qty 100

## 2016-11-27 MED ORDER — 0.9 % SODIUM CHLORIDE (POUR BTL) OPTIME
TOPICAL | Status: DC | PRN
  Administered 2016-11-27: 1000 mL

## 2016-11-27 MED ORDER — ONDANSETRON 4 MG PO TBDP: 4.0000 mg | ORAL_TABLET | Freq: Four times a day (QID) | ORAL | Status: DC | PRN

## 2016-11-27 MED ORDER — LACTATED RINGERS IV SOLN
INTRAVENOUS | Status: DC
  Administered 2016-11-27 (×2): via INTRAVENOUS

## 2016-11-27 SURGICAL SUPPLY — 58 items
APL SKNCLS STERI-STRIP NONHPOA (GAUZE/BANDAGES/DRESSINGS) ×1
APPLIER CLIP 5 13 M/L LIGAMAX5 (MISCELLANEOUS) ×3
APPLIER CLIP ROT 10 11.4 M/L (STAPLE)
APR CLP MED LRG 11.4X10 (STAPLE)
APR CLP MED LRG 5 ANG JAW (MISCELLANEOUS) ×1
BENZOIN TINCTURE PRP APPL 2/3 (GAUZE/BANDAGES/DRESSINGS) ×3 IMPLANT
CABLE HIGH FREQUENCY MONO STRZ (ELECTRODE) ×3 IMPLANT
CHLORAPREP W/TINT 26ML (MISCELLANEOUS) ×3 IMPLANT
CLIP APPLIE 5 13 M/L LIGAMAX5 (MISCELLANEOUS) IMPLANT
CLIP APPLIE ROT 10 11.4 M/L (STAPLE) IMPLANT
CLOSURE WOUND 1/2 X4 (GAUZE/BANDAGES/DRESSINGS) ×1
COVER SURGICAL LIGHT HANDLE (MISCELLANEOUS) ×3 IMPLANT
DECANTER SPIKE VIAL GLASS SM (MISCELLANEOUS) ×3 IMPLANT
DEVICE SUT QUICK LOAD TK 5 (STAPLE) ×7 IMPLANT
DEVICE SUT TI-KNOT TK 5X26 (MISCELLANEOUS) ×2 IMPLANT
DEVICE SUTURE ENDOST 10MM (ENDOMECHANICALS) ×3 IMPLANT
DEVICE TI KNOT TK5 (MISCELLANEOUS) ×1
DISSECTOR BLUNT TIP ENDO 5MM (MISCELLANEOUS) ×3 IMPLANT
DRAIN CHANNEL 19F RND (DRAIN) IMPLANT
DRAIN PENROSE 18X1/2 LTX STRL (DRAIN) ×3 IMPLANT
DRSG TEGADERM 2-3/8X2-3/4 SM (GAUZE/BANDAGES/DRESSINGS) IMPLANT
ELECT REM PT RETURN 15FT ADLT (MISCELLANEOUS) ×3 IMPLANT
EVACUATOR SILICONE 100CC (DRAIN) IMPLANT
FELT TEFLON 4 X1 (Mesh General) ×2 IMPLANT
FILTER SMOKE EVAC LAPAROSHD (FILTER) ×1 IMPLANT
GAUZE SPONGE 2X2 8PLY STRL LF (GAUZE/BANDAGES/DRESSINGS) IMPLANT
GLOVE ECLIPSE 8.0 STRL XLNG CF (GLOVE) ×3 IMPLANT
GLOVE INDICATOR 8.0 STRL GRN (GLOVE) ×3 IMPLANT
GOWN STRL REUS W/TWL XL LVL3 (GOWN DISPOSABLE) ×14 IMPLANT
HEMOSTAT SNOW SURGICEL 2X4 (HEMOSTASIS) ×2 IMPLANT
IRRIG SUCT STRYKERFLOW 2 WTIP (MISCELLANEOUS)
IRRIGATION SUCT STRKRFLW 2 WTP (MISCELLANEOUS) ×1 IMPLANT
KIT BASIN OR (CUSTOM PROCEDURE TRAY) ×3 IMPLANT
PAD POSITIONING PINK XL (MISCELLANEOUS) ×3 IMPLANT
QUICK LOAD TK 5 (STAPLE) ×4
SCISSORS LAP 5X35 DISP (ENDOMECHANICALS) ×3 IMPLANT
SET IRRIG TUBING LAPAROSCOPIC (IRRIGATION / IRRIGATOR) ×2 IMPLANT
SHEARS HARMONIC ACE PLUS 36CM (ENDOMECHANICALS) ×3 IMPLANT
SLEEVE XCEL OPT CAN 5 100 (ENDOMECHANICALS) ×9 IMPLANT
SPONGE DRAIN TRACH 4X4 STRL 2S (GAUZE/BANDAGES/DRESSINGS) IMPLANT
SPONGE GAUZE 2X2 STER 10/PKG (GAUZE/BANDAGES/DRESSINGS)
STRIP CLOSURE SKIN 1/2X4 (GAUZE/BANDAGES/DRESSINGS) ×2 IMPLANT
SUT ETHILON 3 0 PS 1 (SUTURE) IMPLANT
SUT MNCRL AB 4-0 PS2 18 (SUTURE) ×3 IMPLANT
SUT SURGIDAC NAB ES-9 0 48 120 (SUTURE) ×17 IMPLANT
TIP INNERVISION DETACH 40FR (MISCELLANEOUS) IMPLANT
TIP INNERVISION DETACH 50FR (MISCELLANEOUS) IMPLANT
TIP INNERVISION DETACH 56FR (MISCELLANEOUS) IMPLANT
TIPS INNERVISION DETACH 40FR (MISCELLANEOUS)
TOWEL OR 17X26 10 PK STRL BLUE (TOWEL DISPOSABLE) ×3 IMPLANT
TOWEL OR NON WOVEN STRL DISP B (DISPOSABLE) ×3 IMPLANT
TRAY FOLEY BAG SILVER LF 14FR (CATHETERS) ×2 IMPLANT
TRAY FOLEY W/METER SILVER 16FR (SET/KITS/TRAYS/PACK) IMPLANT
TRAY LAPAROSCOPIC (CUSTOM PROCEDURE TRAY) ×3 IMPLANT
TROCAR BLADELESS OPT 5 100 (ENDOMECHANICALS) ×3 IMPLANT
TROCAR XCEL BLUNT TIP 100MML (ENDOMECHANICALS) IMPLANT
TROCAR XCEL NON-BLD 11X100MML (ENDOMECHANICALS) ×3 IMPLANT
TUBING INSUF HEATED (TUBING) ×3 IMPLANT

## 2016-11-27 NOTE — Discharge Instructions (Addendum)
[name]  HIATAL HERNIA REPAIR AFTERCARE INSTRUCTIONS  Start on a Level 1 diet (see below) and do not advance until you see surgeon in the office. No lifting, pushing or pulling over 10 pounds for 2 month. Do not overeat! Walk frequently. Take a laxative of choice if needed. You may drive when you are pain-free. Minimize bending and squatting. Call for high fever (>101.5), vomiting, inability to swallow, wound problems.   EATING AFTER YOUR HIATAL HERNIA REPAIR SURGERY  After your esophageal surgery, you can expect some difficulty swallowing.  If food sticks when you eat, it is called "dysphagia".  This is due to swelling around your surgery site and will most likely resolve within a few weeks.  To help you through this temporary phase, we start you out on a pureed diet.  Your first meal in the [name] was clear liquids.  You should have been given a pureed diet by the time you left the [name].  We ask patients to stay on a pureed diet for the first two weeks to avoid anything getting "stuck" near your recent surgery.  Don't be alarmed if your ability to swallow doesn't progress according to this plan.  Everyone is different and some take longer or shorter.  Use common sense.  If you are having trouble swallowing a particular food, then avoid it.  If food is sticking when you advance your diet, go back to the previous day or two.  In general some simple rules to follow are:  Maintain an upright position (as near 90 degrees as possible) whenever eating or drinking.  Take small bites - only 1/2 to 1 teaspoon at a time.  Eat slowly.  It may also help to eat only one food at a time.  Avoid talking while eating.  Do not mix solid foods and liquids in the same mouthful and do not "wash foods down" with liquids, unless you have been instructed to do so by your surgeon.  Eat in a relaxed atmosphere, with no distractions.  Following each meal, sit in an upright  position (90 degree angle) for 60-90 minutes.  Avoid carbonated (bubbly) drinks. Do not use straws.  If food does stick, don't panic.  Try to relax and let the food pass on its own.  Sipping strong hot black tea can also help.  If you have any questions please call our office at [phone].   LEVEL 1 PUREED FOODS:  1ST 2 WEEKS AFTER SURGERY Foods in this group are pureed or blenderized to a smooth, mashed potato-like consistency.  If necessary, the pureed foods can keep their shape with the addition of a thickening agent.  Meat should be pureed to a smooth pasty consistency.  Hot broth or gravy may be added to the pureed meat, approximately 1 oz. of liquid per 3 oz. serving of meat. CAUTION:  If any foods do not puree into a smooth consistency, it may make eating for swallowing more difficult.  For example, zucchini seeds sometimes do not blend well. Hot Foods Cold Foods  Pureed scrambled eggs and cheese Pureed cottage cheese  Baby cereals Thickened juices and nectars  Thinned cooked cereals (no lumps) Thickened milk or eggnog  Pureed [place] toast or pancakes Ensure  Mashed potatoes Ice cream  Pureed parsley, au gratin, scalloped potatoes, candied sweet potatoes Fruit or [place] ice, sherbet  Pureed buttered or alfredo noodles Plain yogurt  Pureed vegetables (no corn or peas) Instant breakfast  Pureed soups and creamed soups  Smooth pudding, mousse, custard  Pureed scalloped apples Whipped gelatin  Gravies Sugar, syrup, honey, jelly  Sauces, cheese, tomato, barbecue, white, creamed Cream  Any baby food Creamer  Alcohol in moderation (not beer or champagne) Margarine  Coffee or tea Mayonnaise   Ketchup, mustard   Apple sauce   SAMPLE MENU:  PUREED DIET Breakfast Lunch Dinner   Orange juice, 1/2 cup  Cream of wheat, 1/2 cup  Pineapple juice, 1/2 cup  Pureed [place], barley soup, 3/4 cup  Pureed Hawaiian chicken, 3 oz   Scrambled eggs, mashed or blended with cheese, 1/2  cup  Tea or coffee, 1 cup   Whole milk, 1 cup   Non-dairy creamer, 2 Tbsp.  Mashed potatoes, 1/2 cup  Pureed cooled broccoli, 1/2 cup  Apple sauce, 1/2 cup  Coffee or tea  Mashed potatoes, 1/2 cup  Pureed spinach, 1/2 cup  Frozen yogurt, 1/2 cup  Tea or coffee    LEVEL 2 After your first 2 weeks, you can advance to a soft diet.  Keep on this diet until everything goes down easily. Hot Foods Cold Foods  White fish Cottage cheese  Stuffed fish Junior baby fruit  Baby food meals Semi thickened juices  Minced soft cooked, scrambled, poached eggs nectars  Souffle & omelets Ripe mashed bananas  Cooked cereals Canned fruit, pineapple sauce, milk  potatoes Milkshake  Buttered or Alfredo noodles Custard  Cooked cooled vegetable Puddings, including tapioca  Sherbet Yogurt  Vegetable soup or alphabet soup Fruit ice, [place] ice  Gravies Whipped gelatin  Sugar, syrup, honey, jelly Junior baby desserts  Sauces:  Cheese, creamed, barbecue, tomato, white Cream  Coffee or tea Margarine   SAMPLE MENU:  LEVEL 2 Breakfast Lunch Dinner   Orange juice, 1/2 cup  Oatmeal, 1/2 cup  Scrambled eggs with cheese, 1/2 cup  Decaffeinated tea, 1 cup  Whole milk, 1 cup  Non-dairy creamer, 2 Tbsp  Pineapple juice, 1/2 cup  Minced beef, 3 oz  Gravy, 2 Tbsp  Mashed potatoes, 1/2 cup  Minced fresh broccoli, 1/2 cup  Applesauce, 1/2 cup  Coffee, 1 cup  [place], barley soup, 3/4 cup  Minced Hawaiian chicken, 3 oz  Mashed potatoes, 1/2 cup  Cooked spinach, 1/2 cup  Frozen yogurt, 1/2 cup  Non-dairy creamer, 2 Tbsp    LEVEL 3 After all the foods in level 2 (soft diet) are passing through well you should advance up to the next level.  It is still important to cut these foods into small pieces and eat slowly. Hot Foods Cold Foods  Poultry Cottage cheese  Chopped Swedish meatballs Yogurt  Meat salads (ground or flaked meat) Milk  Flaked fish (tuna) Milkshakes  Poached or  scrambled eggs Soft, cold, dry cereal  Souffles and omelets Fruit juices or nectars  Cooked cereals Chopped canned fruit  Chopped [place] toast or pancakes Canned fruit cocktail  Noodles or pasta (no rice) Pudding, mousse, custard  Cooked vegetables (no frozen peas, corn, or mixed vegetables) Green salad  Canned small sweet peas Ice cream  Creamed soup or vegetable soup Fruit ice, [place] ice  Pureed vegetable soup or alphabet soup Non-dairy creamer  Ground scalloped apples Margarine  Gravies Mayonnaise  Sauces:  Cheese, creamed, barbecue, tomato, white Ketchup  Coffee or tea Mustard   SAMPLE MENU:  LEVEL 3 Breakfast Lunch Dinner   Orange juice, 1/2 cup  Oatmeal, 1/2 cup  Scrambled eggs with cheese, 1/2 cup  Decaffeinated tea, 1 cup  Whole milk, 1 cup  Non-dairy creamer, 2 Tbsp  Ketchup, 1 Tbsp  Margarine, 1 tsp  Salt, 1/4 tsp  Sugar, 2 tsp  Pineapple juice, 1/2 cup  Ground beef, 3 oz  Gravy, 2 Tbsp  Mashed potatoes, 1/2 cup  Cooked spinach, 1/2 cup  Applesauce, 1/2 cup  Decaffeinated coffee  Whole milk  Non-dairy creamer, 2 Tbsp  Margarine, 1 tsp  Salt, 1/4 tsp  Pureed [place], barley soup, 3/4 cup  Barbecue chicken, 3 oz  Mashed potatoes, 1/2 cup  Ground fresh broccoli, 1/2 cup  Frozen yogurt, 1/2 cup  Decaffeinated tea, 1 cup  Non-dairy creamer, 2 Tbsp  Margarine, 1 tsp  Salt, 1/4 tsp  Sugar, 1 tsp    LEVEL 4:  REGULAR FOODS Foods in this group are soft, moist, regularly textured foods.  This level includes red meat and breads, which tend to be the hardest things to swallow.  Eat very slow, chew well and continue to avoid carbonated drinks. Hot Foods Cold Foods  Baked fish or skinned Soft cheeses - cottage cheese  Souffles and omelets Cream cheese  Eggs Yogurt  Stuffed shells Milk  Spaghetti with meat sauce Milkshakes  Cooked cereal Cold dry cereals (no nuts, dried fruit, coconut)  [place] toast or pancakes Crackers  Buttered  toast Fruit juices or nectars  Noodles or pasta (no rice) Canned fruit  Potatoes (all types) Ripe bananas  Soft, cooked vegetables (no corn, lima, or baked beans) Peeled, ripe, fresh fruit  Creamed soups or vegetable soup Cakes (no nuts, dried fruit, coconut)  Canned chicken noodle soup Plain doughnuts  Gravies Ice cream  Bacon dressing Pudding, mousse, custard  Sauces:  Cheese, creamed, barbecue, tomato, white Fruit ice, [place] ice, sherbet  Decaffeinated tea or coffee Whipped gelatin  Pork chops Regular gelatin   Canned fruited gelatin molds   Sugar, syrup, honey, jam, jelly   Cream   Non-dairy   Margarine   Oil   Mayonnaise   Ketchup   Mustard

## 2016-11-27 NOTE — Interval H&P Note (Signed)
History and Physical Interval Note:  11/27/2016 1:41 PM  Kathryn [name]  has presented today for surgery, with the diagnosis of hiatal hernia and GERD  The various methods of treatment have been discussed with the patient and family. After consideration of risks, benefits and other options for treatment, the patient has consented to  Procedure(s): [place] (N/A) as a surgical intervention .  The patient's history has been reviewed, patient examined, no change in status, stable for surgery.  I have reviewed the patient's chart and labs.  Questions were answered to the patient's satisfaction.     Jennife Zaucha Lenna Sciara

## 2016-11-27 NOTE — H&P (View-Only) (Signed)
Kathryn [name] 10/26/2016 3:37 PM Location: [facility] Patient #: [id number] DOB: Dec 30, 1955 Married / Language: English / Race: White Female   History of Present Illness Odis Hollingshead MD; 10/26/2016 4:32 PM) The patient is a 61 year old female.  Note:She presents today for preoperative visit. Upper GI demonstrated some reflux but no obvious hiatal hernia. 24-hour pH study was abnormal with an elevated score. Manometry demonstrated normal motility but decreased lower esophageal sphincter pressure at relaxation. Her husband is here with her. She is interested in having the surgery. She is going to see Dr. Chalmers Cater regarding her adrenal insufficiency at the end of this month.  She had significant problems with postoperative nausea and vomiting after her shoulder surgery. However, she did not have these problems with her prior back surgery.  Allergies (April Staton, CMA; 10/26/2016 3:37 PM) Niacin (Antihyperlipidemic) *ANTIHYPERLIPIDEMICS*  Shortness of breath, swellling. Codeine Phosphate *ANALGESICS - OPIOID*  Swelling. Zinc *MINERALS & ELECTROLYTES*  Swelling.  Medication History (April Staton, CMA; 10/26/2016 3:37 PM) Tylenol (325MG  Tablet, Oral as needed) Active. Xanax (0.5MG  Tablet, Oral as needed) Active. Bentyl (10MG  Capsule, Oral as needed) Active. Cortef (10MG  Tablet, Oral daily) Active. Protonix (40MG  Tablet DR, Oral daily) Active. Phenergan (12.5MG  Tablet, Oral as needed) Active. Valtrex (500MG  Tablet, Oral daily) Active. Medications Reconciled  Vitals (April Staton CMA; 10/26/2016 3:37 PM) 10/26/2016 3:37 PM Weight: 162.25 lb Height: 67in Body Surface Area: 1.85 m Body Mass Index: 25.41 kg/m  Temp.: 97.31F(Oral)  Pulse: 76 (Regular)  BP: 130/70 (Sitting, Left Arm, Standard)       Physical Exam Odis Hollingshead MD; 10/26/2016 4:31 PM) The physical exam findings are as follows: Note:GENERAL APPEARANCE: WDWN in NAD.  Pleasant and cooperative.  EARS, NOSE, MOUTH THROAT: [place]/AT external ears: no lesions or deformities external nose: no lesions or deformities hearing: grossly normal lips: moist, no deformities EYES external: conjunctiva, lids, sclerae normal pupils: equal, round  CV ascultation: RRR, no murmur   RESP auscultation: breath sounds equal and clear respiratory effort: normal  GASTROINTESTINAL abdomen: Soft, non-tender, non-distended, no masses liver and spleen: not enlarged. hernia: none present scar: RUQ scar  PSYCHIATRIC alertness and orientation: normal mood/affect/behavior: normal judgement and insight: normal    Assessment & Plan Odis Hollingshead MD; 10/26/2016 4:33 PM) HIATAL HERNIA WITH GERD (K21.9) Impression: Upper GI did not demonstrate an obvious hiatal hernia but other test results suggested. She does have low LES pressure and relaxation. She since her stated proceeding with the operation.  Plan: We discussed laparoscopic Nissen fundoplication and hiatal hernia repair. I have explained the procedure, risks, and aftercare. Risks include but are not limited to bleeding, infection, wound problems, anesthesia, dysphagia, injury to esophagus/stomach/liver/spleen/intestine,gas bloat, diarrhea, failure to resolve symptoms.  Strict dietary restriction were explained as well as changes in lifestyle.  She seems to understand. As for her adrenal insufficiency, Dr. Chalmers Cater, her endocrinologist, recommends hydrocortisone 50 mg IV every 8 hours intraoperatively and recommends stress dose steroid after the surgery until she is pain free.  Jackolyn Confer, M.D/

## 2016-11-27 NOTE — Anesthesia Procedure Notes (Signed)
Procedure Name: Intubation Date/Time: 11/27/2016 3:00 PM Performed by: Danley Danker L Patient Re-evaluated:Patient Re-evaluated prior to inductionOxygen Delivery Method: Circle system utilized Preoxygenation: Pre-oxygenation with 100% oxygen Intubation Type: IV induction Ventilation: Mask ventilation without difficulty Laryngoscope Size: Miller and 2 Grade View: Grade I Tube type: Oral Tube size: 7.5 mm Number of attempts: 1 Airway Equipment and Method: Stylet Placement Confirmation: ETT inserted through vocal cords under direct vision,  positive ETCO2 and breath sounds checked- equal and bilateral Secured at: 22 cm Tube secured with: Tape Dental Injury: Teeth and Oropharynx as per pre-operative assessment

## 2016-11-27 NOTE — Anesthesia Preprocedure Evaluation (Addendum)
Anesthesia Evaluation  Patient identified by MRN, date of birth, ID band Patient awake    Reviewed: Allergy & Precautions, NPO status , Patient's Chart, lab work & pertinent test results  History of Anesthesia Complications (+) PONV and history of anesthetic complications  Airway Mallampati: I  TM Distance: >3 FB Neck ROM: Full    Dental  (+) Teeth Intact, Dental Advisory Given   Pulmonary asthma , former smoker,    breath sounds clear to auscultation       Cardiovascular negative cardio ROS   Rhythm:Regular Rate:Normal     Neuro/Psych Anxiety  Neuromuscular disease    GI/Hepatic hiatal hernia, GERD  Medicated,(+) Hepatitis -  Endo/Other  negative endocrine ROS  Renal/GU negative Renal ROS  negative genitourinary   Musculoskeletal  (+) Arthritis , Osteoarthritis,    Abdominal   Peds negative pediatric ROS (+)  Hematology negative hematology ROS (+)   Anesthesia Other Findings   Reproductive/Obstetrics negative OB ROS                            Anesthesia Physical Anesthesia Plan  ASA: II  Anesthesia Plan: General   Post-op Pain Management:    Induction: Intravenous  Airway Management Planned: Oral ETT  Additional Equipment:   Intra-op Plan:   Post-operative Plan: Extubation in OR  Informed Consent: I have reviewed the patients History and Physical, chart, labs and discussed the procedure including the risks, benefits and alternatives for the proposed anesthesia with the patient or authorized representative who has indicated his/her understanding and acceptance.   Dental advisory given  Plan Discussed with: CRNA  Anesthesia Plan Comments: (TIVA, 50mg  Hydrocort)       Anesthesia Quick Evaluation

## 2016-11-27 NOTE — Transfer of Care (Signed)
Immediate Anesthesia Transfer of Care Note  Patient: Kathryn [name]  Procedure(s) Performed: Procedure(s): LAPAROSCOPIC REPAIR OF HIATAL HERNIA WITH NISSEN FUNDOPLICATION (N/A)  Patient Location: PACU  Anesthesia Type:General  Level of Consciousness: awake, alert  and oriented  Airway & Oxygen Therapy: Patient Spontanous Breathing and Patient connected to face mask oxygen  Post-op Assessment: Post -op Vital signs reviewed and stable  Post vital signs: Reviewed and stable  Last Vitals:  Vitals:   11/27/16 1155 11/27/16 1745  BP: (!) 128/59 132/73  Pulse: 74 82  Resp: 16 13  Temp: 36.3 C 36.8 C    Last Pain:  Vitals:   11/27/16 1205  TempSrc:   PainSc: 2       Patients Stated Pain Goal: 3 (15/17/61 [id number])  Complications: No apparent anesthesia complications

## 2016-11-27 NOTE — Anesthesia Postprocedure Evaluation (Signed)
Anesthesia Post Note  Patient: Kathryn [name]  Procedure(s) Performed: Procedure(s) (LRB): LAPAROSCOPIC REPAIR OF HIATAL HERNIA WITH NISSEN FUNDOPLICATION (N/A)  Patient location during evaluation: PACU Anesthesia Type: General Level of consciousness: awake and alert Pain management: pain level controlled Vital Signs Assessment: post-procedure vital signs reviewed and stable Respiratory status: spontaneous breathing, nonlabored ventilation, respiratory function stable and patient connected to nasal cannula oxygen Cardiovascular status: blood pressure returned to baseline and stable Postop Assessment: no signs of nausea or vomiting Anesthetic complications: no       Last Vitals:  Vitals:   11/27/16 1824 11/27/16 1830  BP:    Pulse: 79   Resp: 13   Temp:  36.4 C    Last Pain:  Vitals:   11/27/16 1830  TempSrc:   PainSc: 0-No pain                 Effie Berkshire

## 2016-11-27 NOTE — Op Note (Signed)
OPERATIVE NOTE-HIATAL HERNIA REPAIR AND NISSEN FUNDOPLICATION  PREOPERATIVE DX:  Hiatal hernia with gastroesophageal reflux disease  POSTOPERATIVE DX:  Same  PROCEDURE:   1. Laparoscopic hiatal hernia repair. 2. Laparoscopic Nissen fundoplication (over a 56 French dilator) .          Surgeon: Odis Hollingshead   Assistants:  Judeth Horn, M.D.  Anesthesia: General endotracheal anesthesia  EBL:  150 cc  Indications:   This is a 61 year old female with a hiatal hernia and medically refractory GERD who presents for the above operation.  Procedure Detail:  She was brought to the operating room and placed supine on the operating table and a general anesthetic was given. A Foley catheter was inserted. An oral gastric tube was inserted. The abdominal wall was widely sterilely prepped and draped. A timeout was performed.  She was placed in slight reverse Trendelenburg position. A 5 mm incision was made in the left subcostal area.  Using a 5 mm Optiview trocar and laparoscope,  Access was gained into the peritoneal cavity and a pneumoperitoneum created by insufflation of carbon dioxide gas. Inspection of the area underneath the trocar demonstrated no evidence of organ injury or bleeding.  A 5 mm trocar was placed just to the left of the umbilicus.  A 5 mm trocar was placed in the right upper quadrant. An 11 mm trocar was placed in the epigastrium.  A 5 mm trocar was placed  In the left lateral abdomen.  A 5 mm incision was made in the upper epigastrium just to the left of midline. A self retaining liver retractor was placed into the abdominal cavity. The left lobe of the liver was then retracted anteriorly exposing a small hiatal hernia. The stomach was easily reducible.  The thin gastrohepatic ligament was divided proximally up toward the right crus.  I then began dissecting the sac free from the right crus anterior crus and left crus using blunt dissection and the Harmonic scalpel. I then carefully  mobilized the esophagus by dividing thin filmy lateral adhesions as far proximally as I could. There was no evidence of injury to the stomach or the esophagus.  There was 3-4 cm of intraabdominal esophagus.  A retroesophageal window was created using blunt dissection and a Penrose drain was placed through the window and used to retract the GE junction.   Short gastric vessels of the fundus were divided mobilizing the fundus. The hiatal hernia defect was then closed with interrupted size 0 non-absorbable pledgeted sutures. When adequate closure was achieved, the penrose drain was removed.  The fundus of the stomach was then passed posterior to the esophagus. A size 56 dilator was then passed into the stomach. A 854,  2.5 cm fundoplication was then performed with interrupted size 0 non-absorbable sutures. The dilator was removed and was intact. The wrap was floppy and under no tension.  The abdominal cavity was then inspected in all quadrants. There is no evidence of bleeding or organ injury. The liver retractor was removed. The pneumoperitoneum was released and the trocars were removed.  The skin incisions were closed with 4-0 Monocryl subcuticular stitches. Steri-Strips and sterile dressings were applied.  She tolerated the procedure well without any apparent complications. She was taken to the recovery room in satisfactory condition.

## 2016-11-28 MED ORDER — HYDROCODONE-HOMATROPINE 5-1.5 MG/5ML PO SYRP: 5.0000 mL | ORAL_SOLUTION | ORAL | Status: DC | PRN

## 2016-11-28 MED ORDER — HYDROCODONE-HOMATROPINE 5-1.5 MG/5ML PO SYRP
5.0000 mL | ORAL_SOLUTION | ORAL | 0 refills | Status: DC | PRN
Start: 2016-11-28 — End: 2019-01-03

## 2016-11-28 NOTE — Progress Notes (Signed)
Assessment Active Problems:   Hiatal hernia with GERD s/p laparoscopic hiatal hernia repair and NIssen fundoplication 9/52/84-[id number] well   History of severe post N/V-    Adrenal insufficiency-on hydrocortisone 50 mg IV q8 hrs periop stress dosing  as recommended by her endocrinologist   Plan:  Start liquid diet.  We have extensively discussed her postop diet and activity restrictions.   LOS: 1 day     1 Day Post-Op  Subjective: Not having much pain.  Minimal nausea. Has walked 5 times.  Objective: Vital signs in last 24 hours: Temp:  [97.4 F (36.3 C)-98.8 F (37.1 C)] 98.2 F (36.8 C) (03/24 0523) Pulse Rate:  [72-85] 83 (03/24 0523) Resp:  [12-18] 16 (03/24 0523) BP: (125-152)/(58-73) 125/60 (03/24 0523) SpO2:  [93 %-100 %] 96 % (03/24 0523) Weight:  [73.9 kg (163 lb)] 73.9 kg (163 lb) (03/23 1205)    Intake/Output from previous day: 03/23 0701 - 03/24 0700 In: 2900 [I.V.:2900] Out: 1065 [Urine:1005; Blood:60] Intake/Output this shift: Total I/O In: -  Out: 400 [Urine:400]  PE: General- In NAD Abdomen-soft, dressings dry  Lab Results:   Recent Labs  11/27/16 2222  WBC 15.4*  HGB 12.4  HCT 35.7*  PLT 243   BMET  Recent Labs  11/27/16 2107  CREATININE 0.83   PT/INR No results for input(s): LABPROT, INR in the last 72 hours. Comprehensive Metabolic Panel:    Component Value Date/Time   NA 141 11/24/2016 1330   NA 137 02/22/2016 0440   K 4.4 11/24/2016 1330   K 3.7 02/22/2016 0440   CL 106 11/24/2016 1330   CL 102 02/22/2016 0440   CO2 28 11/24/2016 1330   CO2 26 02/22/2016 0440   BUN 12 11/24/2016 1330   BUN 9 02/22/2016 0440   CREATININE 0.83 11/27/2016 2107   CREATININE 0.83 11/24/2016 1330   GLUCOSE 106 (H) 11/24/2016 1330   GLUCOSE 106 (H) 02/22/2016 0440   CALCIUM 9.7 11/24/2016 1330   CALCIUM 8.6 (L) 02/22/2016 0440   AST 47 (H) 11/24/2016 1330   ALT 61 (H) 11/24/2016 1330   ALKPHOS 90 11/24/2016 1330   BILITOT 0.6 11/24/2016  1330   PROT 7.7 11/24/2016 1330   ALBUMIN 4.4 11/24/2016 1330     Studies/Results: No results found.  Anti-infectives: Anti-infectives    Start     Dose/Rate Route Frequency Ordered Stop   11/27/16 2200  ceFAZolin (ANCEF) IVPB 2g/100 mL premix     2 g 200 mL/hr over 30 Minutes Intravenous Every 8 hours 11/27/16 1902 11/27/16 2225   11/27/16 1139  ceFAZolin (ANCEF) IVPB 2g/100 mL premix     2 g 200 mL/hr over 30 Minutes Intravenous On call to O.R. 11/27/16 1139 11/27/16 1505       Elsie Sakuma J 11/28/2016

## 2016-11-29 NOTE — Progress Notes (Signed)
Assessment Active Problems:   Hiatal hernia with GERD s/p laparoscopic hiatal hernia repair and NIssen fundoplication 0/76/22-[id number] well   History of severe post N/V-none so far    Adrenal insufficiency-on hydrocortisone 50 mg IV q8 hrs periop stress dosing  as recommended by her endocrinologist   Plan:  Discharge today.  Instructions given to her.  LOS: 2 days     2 Days Post-Op  Subjective: Sore. Tolerating diet.  Passing some gas.  Objective: Vital signs in last 24 hours: Temp:  [97.7 F (36.5 C)-98.4 F (36.9 C)] 98.2 F (36.8 C) (03/25 0539) Pulse Rate:  [66-85] 85 (03/25 0539) Resp:  [15-16] 16 (03/25 0539) BP: (109-123)/(42-55) 109/42 (03/25 0539) SpO2:  [96 %-100 %] 96 % (03/25 0539) Last BM Date: 11/27/16  Intake/Output from previous day: 03/24 0701 - 03/25 0700 In: 3244.2 [P.O.:1940; I.V.:1304.2] Out: 2220 [Urine:2220] Intake/Output this shift: No intake/output data recorded.  PE: General- In NAD Abdomen-soft, dressings dry  Lab Results:   Recent Labs  11/27/16 2222  WBC 15.4*  HGB 12.4  HCT 35.7*  PLT 243   BMET  Recent Labs  11/27/16 2107  CREATININE 0.83   PT/INR No results for input(s): LABPROT, INR in the last 72 hours. Comprehensive Metabolic Panel:    Component Value Date/Time   NA 141 11/24/2016 1330   NA 137 02/22/2016 0440   K 4.4 11/24/2016 1330   K 3.7 02/22/2016 0440   CL 106 11/24/2016 1330   CL 102 02/22/2016 0440   CO2 28 11/24/2016 1330   CO2 26 02/22/2016 0440   BUN 12 11/24/2016 1330   BUN 9 02/22/2016 0440   CREATININE 0.83 11/27/2016 2107   CREATININE 0.83 11/24/2016 1330   GLUCOSE 106 (H) 11/24/2016 1330   GLUCOSE 106 (H) 02/22/2016 0440   CALCIUM 9.7 11/24/2016 1330   CALCIUM 8.6 (L) 02/22/2016 0440   AST 47 (H) 11/24/2016 1330   ALT 61 (H) 11/24/2016 1330   ALKPHOS 90 11/24/2016 1330   BILITOT 0.6 11/24/2016 1330   PROT 7.7 11/24/2016 1330   ALBUMIN 4.4 11/24/2016 1330      Studies/Results: No results found.  Anti-infectives: Anti-infectives    Start     Dose/Rate Route Frequency Ordered Stop   11/27/16 2200  ceFAZolin (ANCEF) IVPB 2g/100 mL premix     2 g 200 mL/hr over 30 Minutes Intravenous Every 8 hours 11/27/16 1902 11/27/16 2225   11/27/16 1139  ceFAZolin (ANCEF) IVPB 2g/100 mL premix     2 g 200 mL/hr over 30 Minutes Intravenous On call to O.R. 11/27/16 1139 11/27/16 1505       Tanasia Budzinski J 11/29/2016

## 2016-11-29 NOTE — Progress Notes (Signed)
Assessment unchanged. Pt and husband verbalized understanding of dc instructions through teach back including medications, diet, follow up care, and when to call the doctor. Script x 1 given as provided by MD. Discharged via foot per pt request to front entrance accompanied by NT and husband.

## 2016-11-29 NOTE — Discharge Summary (Signed)
Physician Discharge Summary  Patient ID: Kathryn [name] MRN: [id number] DOB/AGE: [id number] 62 y.o.  Admit date: 11/27/2016 Discharge date: 11/29/2016  Admission Diagnoses:  Hiatal hernia with GERD  Discharge Diagnoses:  Active Problems:   Hiatal hernia with GERD s/p laparoscopic hiatal hernia repair and Nissen fundoplication 9/43/27  Adrenal insufficiency   Discharged Condition: good  Hospital Course: She underwent the above procedure and tolerated it well.  She was started on a full liquid diet on POD #1.  She was kept on stress steroids.  By POD #2 she was tolerating her diet well and was able to be discharged.   Discharge Exam: Blood pressure (!) 109/42, pulse 85, temperature 98.2 F (36.8 C), temperature source Oral, resp. rate 16, height 5\' 7"  (1.702 m), weight 73.9 kg (163 lb), SpO2 96 %.   Disposition: 01-Home or Self Care   Allergies as of 11/29/2016      Reactions   Niacin And Related Shortness Of Breath, Swelling, Other (See Comments)   Neck; skin hot   Codeine Swelling, Other (See Comments)   tongue   Zinc Swelling      Medication List    STOP taking these medications   ibuprofen 200 MG tablet Commonly known as:  ADVIL,MOTRIN   pantoprazole 40 MG tablet Commonly known as:  PROTONIX     TAKE these medications   acetaminophen 500 MG tablet Commonly known as:  TYLENOL Take 500-1,000 mg by mouth every 6 (six) hours as needed (For pain.).   ALPRAZolam 0.5 MG tablet Commonly known as:  XANAX Take 0.5 mg by mouth as needed for anxiety.   dicyclomine 10 MG capsule Commonly known as:  BENTYL Take 10 mg by mouth as needed (For irritable bowel syndrome.).   HYDROcodone-homatropine 5-1.5 MG/5ML syrup Commonly known as:  HYCODAN Take 5 mLs by mouth every 4 (four) hours as needed for cough.   hydrocortisone 5 MG tablet Commonly known as:  CORTEF Take 5 mg by mouth 2 (two) times daily.   montelukast 10 MG tablet Commonly known as:  SINGULAIR Take 10 mg  by mouth every morning.   pramipexole 0.125 MG tablet Commonly known as:  MIRAPEX Take 0.125 mg by mouth at bedtime as needed (For restless leg syndrome.).   traMADol 50 MG tablet Commonly known as:  ULTRAM Take 50-100 mg by mouth every 6 (six) hours as needed (For pain.).   valACYclovir 1000 MG tablet Commonly known as:  VALTREX Take 1,000 mg by mouth daily.   VITAMIN D-3 PO Take 1 tablet by mouth daily.        Signed: Odis Hollingshead 11/29/2016, 7:59 AM

## 2016-11-30 ENCOUNTER — Encounter (HOSPITAL_COMMUNITY): Payer: Self-pay | Admitting: General Surgery

## 2016-12-18 ENCOUNTER — Other Ambulatory Visit (HOSPITAL_COMMUNITY): Payer: Self-pay | Admitting: Family Medicine

## 2016-12-18 ENCOUNTER — Ambulatory Visit (HOSPITAL_COMMUNITY)
Admission: RE | Admit: 2016-12-18 | Discharge: 2016-12-18 | Disposition: A | Discharge status: Home / Self Care | Payer: BLUE CROSS/BLUE SHIELD | Source: Ambulatory Visit | Attending: Family Medicine | Admitting: Family Medicine

## 2016-12-18 DIAGNOSIS — R059 Cough, unspecified: Secondary | ICD-10-CM

## 2016-12-18 DIAGNOSIS — R05 Cough: Secondary | ICD-10-CM

## 2017-02-05 NOTE — Addendum Note (Signed)
Addendum  created 02/05/17 1122 by Effie Berkshire, MD   Sign clinical note

## 2017-02-05 NOTE — Anesthesia Postprocedure Evaluation (Signed)
Anesthesia Post Note  Patient: Kathryn [name]  Procedure(s) Performed: Procedure(s) (LRB): LAPAROSCOPIC REPAIR OF HIATAL HERNIA WITH NISSEN FUNDOPLICATION (N/A)     Anesthesia Post Evaluation  Last Vitals:  Vitals:   11/28/16 2229 11/29/16 0539  BP: (!) 123/55 (!) 109/42  Pulse: 80 85  Resp: 16 16  Temp: 36.9 C 36.8 C    Last Pain:  Vitals:   11/29/16 0945  TempSrc:   PainSc: 2                  Effie Berkshire

## 2017-03-28 IMAGING — CR DG SHOULDER 2+V PORT*R*
2 series · 2 of 2 positions shown · non-contrast
Comparison: Portable exam 9669 hours compared to 01/25/2015

CLINICAL DATA: Post RIGHT shoulder replacement

EXAM:
PORTABLE RIGHT SHOULDER - 2+ VIEW

[AP (1 of 2)]
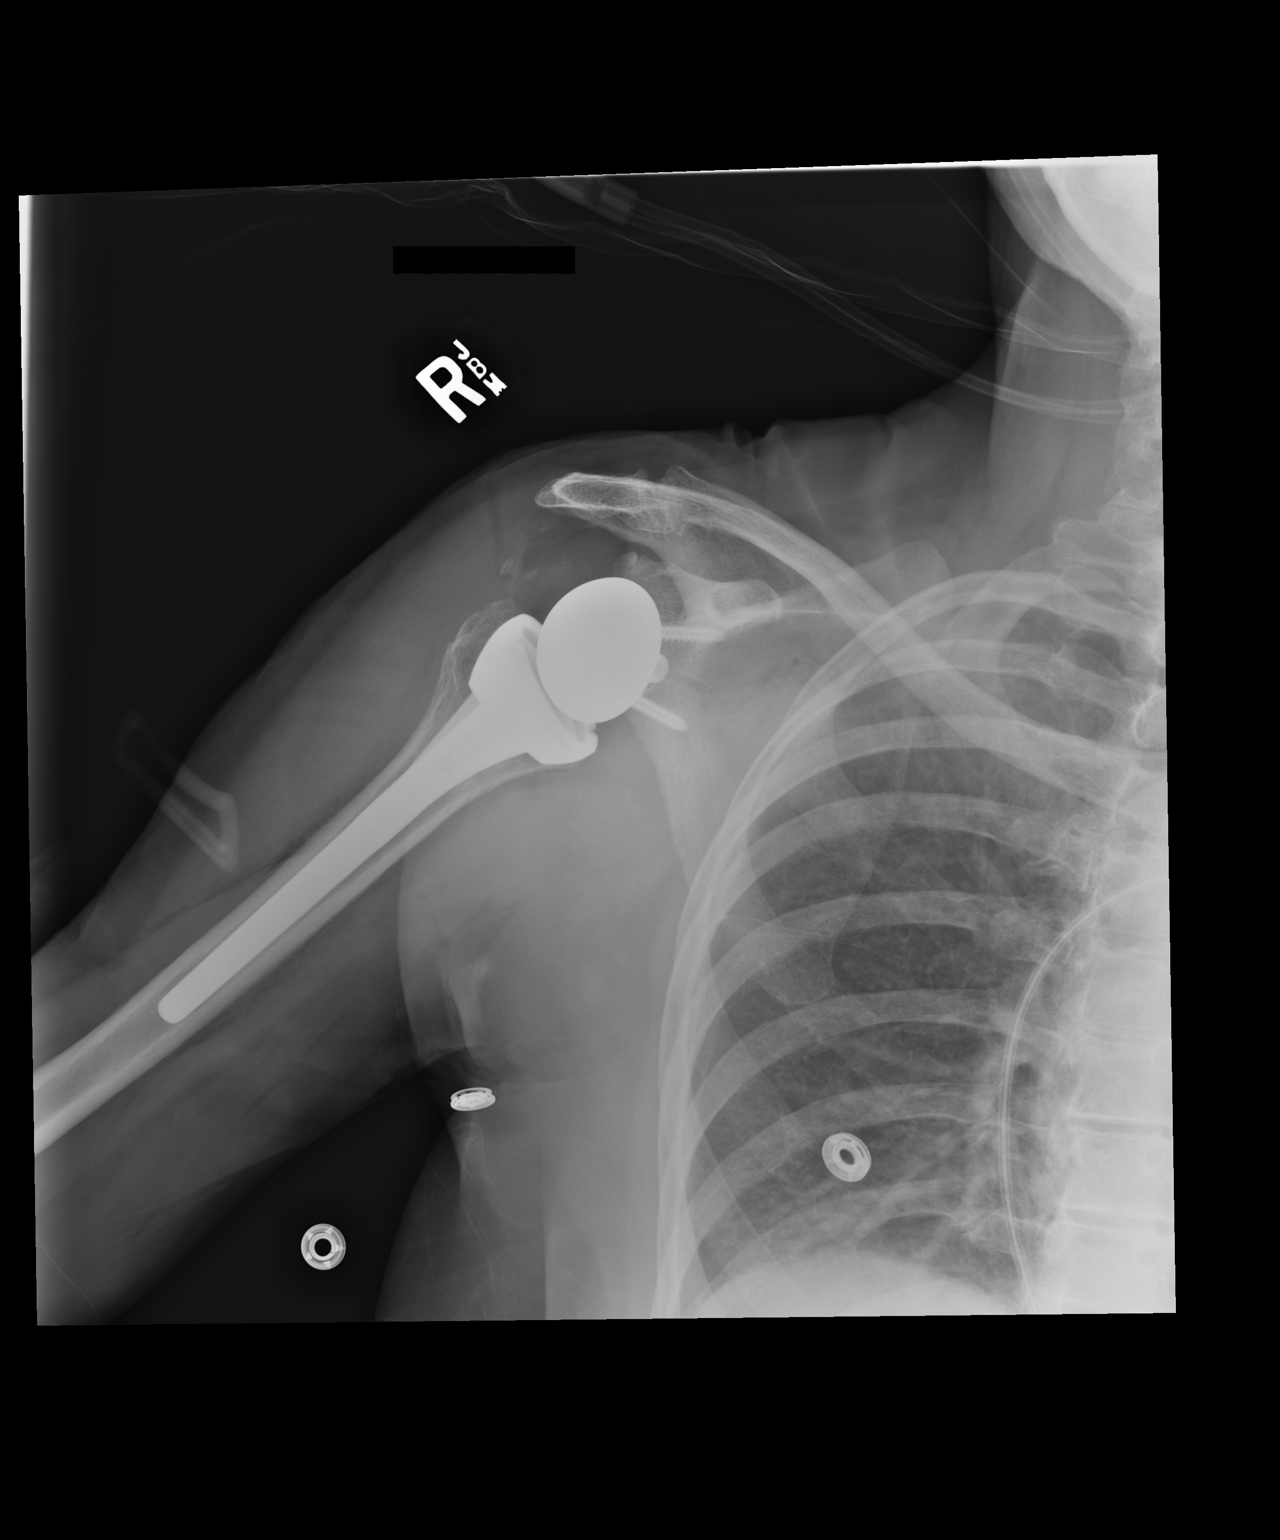

[AP (2 of 2)]
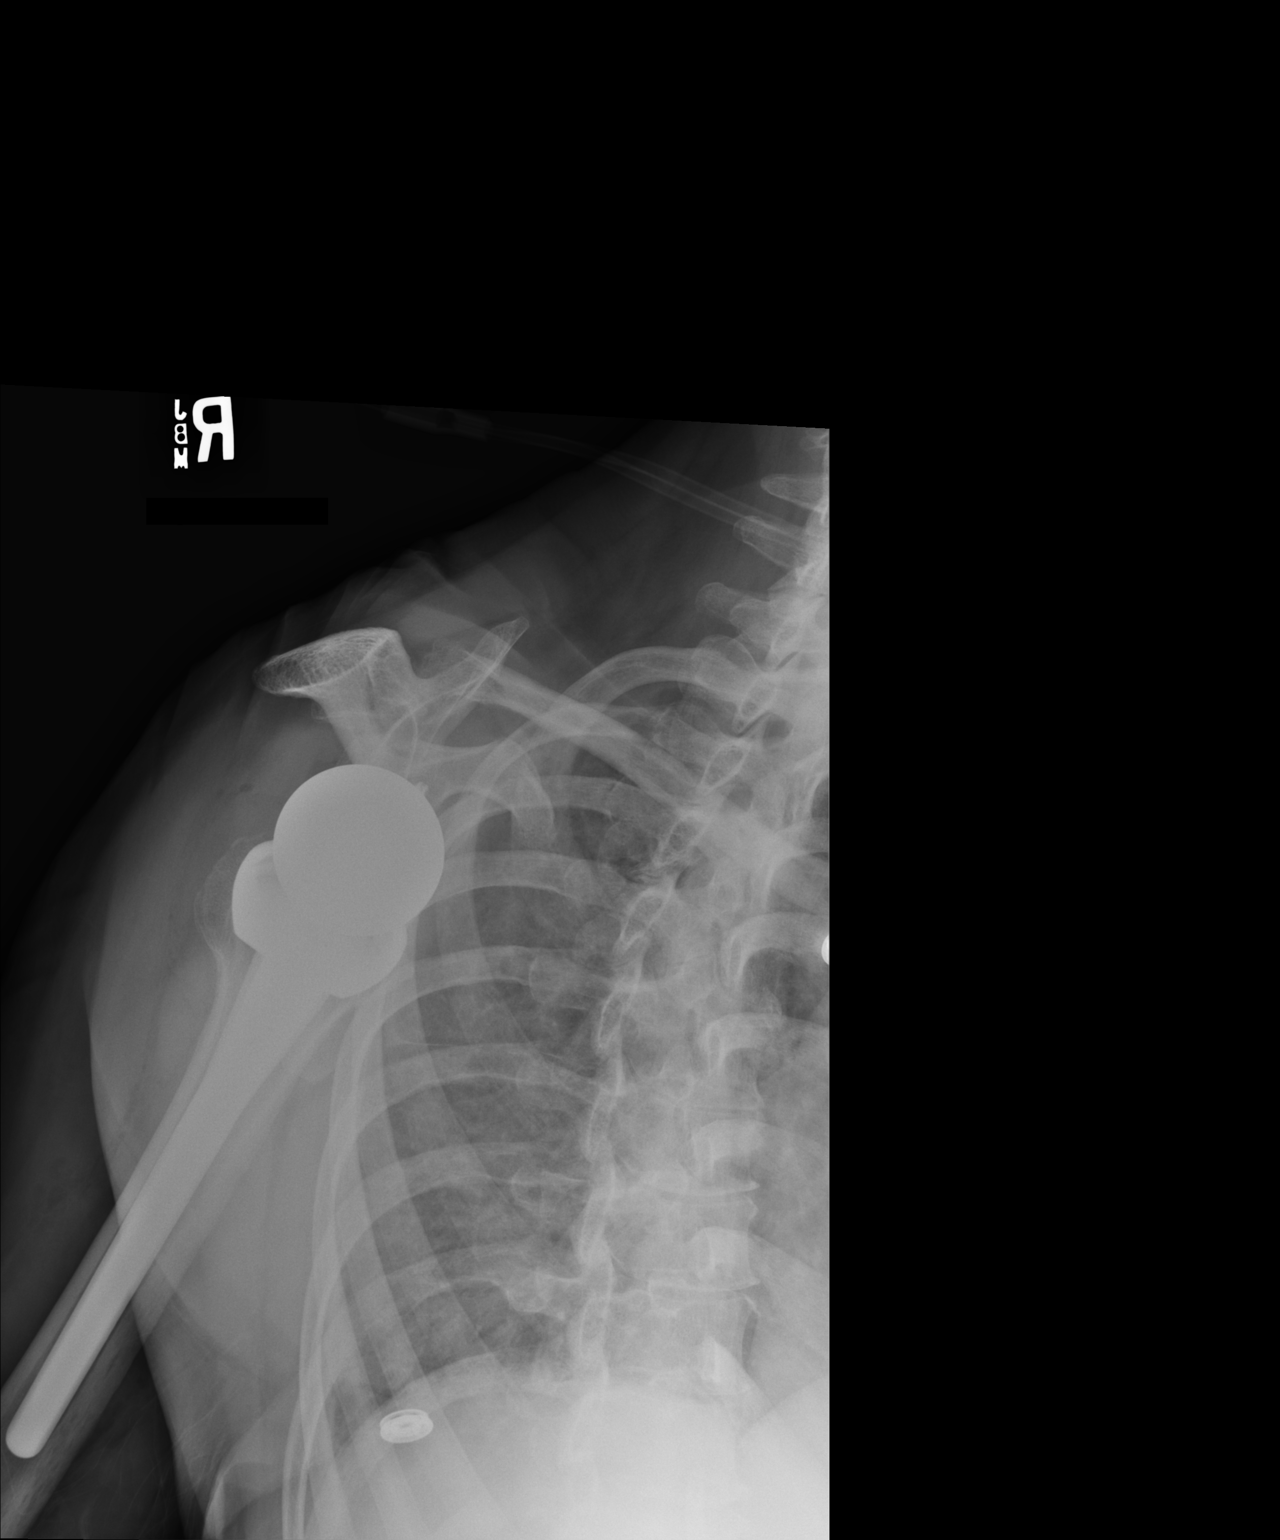

[2 of 2 positions shown; findings below may reference images not displayed]

FINDINGS: Interval reverse RIGHT shoulder arthroplasty.

Prosthetic components are identified without fracture dislocation.

No periprosthetic lucency.

Mild degenerative changes RIGHT AC joint.
IMPRESSION: Post reverse RIGHT shoulder arthroplasty without acute bony
abnormalities.

## 2017-07-02 ENCOUNTER — Other Ambulatory Visit (HOSPITAL_COMMUNITY): Payer: Self-pay

## 2017-07-05 ENCOUNTER — Ambulatory Visit (HOSPITAL_COMMUNITY)
Admission: RE | Admit: 2017-07-05 | Discharge: 2017-07-05 | Disposition: A | Discharge status: Home / Self Care | Payer: BLUE CROSS/BLUE SHIELD | Source: Ambulatory Visit | Attending: Endocrinology | Admitting: Endocrinology

## 2017-07-05 ENCOUNTER — Other Ambulatory Visit (HOSPITAL_COMMUNITY): Payer: Self-pay

## 2017-07-05 DIAGNOSIS — E2749 Other adrenocortical insufficiency: Secondary | ICD-10-CM | POA: Insufficient documentation

## 2017-07-05 LAB — ACTH STIMULATION, 3 TIME POINTS
CORTISOL 30 MIN: 13.9 ug/dL
CORTISOL BASE: 9.4 ug/dL
Cortisol, 60 Min: 17.2 ug/dL

## 2017-07-05 MED ORDER — COSYNTROPIN 0.25 MG IJ SOLR
0.2500 mg | Freq: Once | INTRAMUSCULAR | Status: AC
  Administered 2017-07-05: 09:00:00 0.25 mg via INTRAVENOUS
  Filled 2017-07-05: qty 0.25

## 2018-01-14 ENCOUNTER — Other Ambulatory Visit (HOSPITAL_COMMUNITY): Payer: Self-pay | Admitting: Neurosurgery

## 2018-01-14 DIAGNOSIS — M8580 Other specified disorders of bone density and structure, unspecified site: Secondary | ICD-10-CM

## 2018-01-18 ENCOUNTER — Other Ambulatory Visit (HOSPITAL_COMMUNITY): Payer: Self-pay | Admitting: Family Medicine

## 2018-01-18 DIAGNOSIS — Z1231 Encounter for screening mammogram for malignant neoplasm of breast: Secondary | ICD-10-CM

## 2018-01-24 ENCOUNTER — Encounter (HOSPITAL_COMMUNITY): Payer: Self-pay

## 2018-01-24 ENCOUNTER — Ambulatory Visit (HOSPITAL_COMMUNITY)
Admission: RE | Admit: 2018-01-24 | Discharge: 2018-01-24 | Disposition: A | Discharge status: Home / Self Care | Payer: BLUE CROSS/BLUE SHIELD | Source: Ambulatory Visit | Attending: Family Medicine | Admitting: Family Medicine

## 2018-01-24 ENCOUNTER — Ambulatory Visit (HOSPITAL_COMMUNITY)
Admission: RE | Admit: 2018-01-24 | Discharge: 2018-01-24 | Disposition: A | Discharge status: Home / Self Care | Payer: BLUE CROSS/BLUE SHIELD | Source: Ambulatory Visit | Attending: Neurosurgery | Admitting: Neurosurgery

## 2018-01-24 DIAGNOSIS — Z1231 Encounter for screening mammogram for malignant neoplasm of breast: Secondary | ICD-10-CM | POA: Insufficient documentation

## 2018-01-24 DIAGNOSIS — Z09 Encounter for follow-up examination after completed treatment for conditions other than malignant neoplasm: Secondary | ICD-10-CM | POA: Insufficient documentation

## 2018-01-24 DIAGNOSIS — M8580 Other specified disorders of bone density and structure, unspecified site: Secondary | ICD-10-CM

## 2018-01-24 DIAGNOSIS — Z8739 Personal history of other diseases of the musculoskeletal system and connective tissue: Secondary | ICD-10-CM | POA: Insufficient documentation

## 2018-08-08 ENCOUNTER — Encounter: Payer: Self-pay | Admitting: Cardiovascular Disease

## 2018-08-18 ENCOUNTER — Ambulatory Visit (INDEPENDENT_AMBULATORY_CARE_PROVIDER_SITE_OTHER): Payer: PPO | Admitting: Otolaryngology

## 2018-08-18 DIAGNOSIS — J343 Hypertrophy of nasal turbinates: Secondary | ICD-10-CM

## 2018-08-18 DIAGNOSIS — J310 Chronic rhinitis: Secondary | ICD-10-CM | POA: Diagnosis not present

## 2018-08-18 DIAGNOSIS — J342 Deviated nasal septum: Secondary | ICD-10-CM

## 2018-08-19 ENCOUNTER — Other Ambulatory Visit (INDEPENDENT_AMBULATORY_CARE_PROVIDER_SITE_OTHER): Payer: Self-pay | Admitting: Otolaryngology

## 2018-08-19 DIAGNOSIS — J320 Chronic maxillary sinusitis: Secondary | ICD-10-CM

## 2018-09-07 DIAGNOSIS — R0789 Other chest pain: Secondary | ICD-10-CM

## 2018-09-07 HISTORY — DX: Other chest pain: R07.89

## 2018-09-22 ENCOUNTER — Encounter: Payer: Self-pay | Admitting: Cardiovascular Disease

## 2018-09-22 ENCOUNTER — Encounter: Payer: Self-pay | Admitting: *Deleted

## 2018-09-22 ENCOUNTER — Ambulatory Visit: Payer: BLUE CROSS/BLUE SHIELD | Admitting: Cardiovascular Disease

## 2018-09-22 VITALS — BP 128/72 | HR 74 | Ht 67.0 in | Wt 149.0 lb

## 2018-09-22 DIAGNOSIS — R1310 Dysphagia, unspecified: Secondary | ICD-10-CM

## 2018-09-22 DIAGNOSIS — R002 Palpitations: Secondary | ICD-10-CM | POA: Diagnosis not present

## 2018-09-22 DIAGNOSIS — R079 Chest pain, unspecified: Secondary | ICD-10-CM | POA: Diagnosis not present

## 2018-09-22 NOTE — Patient Instructions (Signed)
Medication Instructions:  Your physician recommends that you continue on your current medications as directed. Please refer to the Current Medication list given to you today.  If you need a refill on your cardiac medications before your next appointment, please call your pharmacy.   Lab work: NONE   If you have labs (blood work) drawn today and your tests are completely normal, you will receive your results only by: Marland Kitchen MyChart Message (if you have MyChart) OR . A paper copy in the mail If you have any lab test that is abnormal or we need to change your treatment, we will call you to review the results.  Testing/Procedures: Your physician has requested that you have an exercise tolerance test. For further information please visit [email or web address]. Please also follow instruction sheet, as given.    Follow-Up: At [facility], you and your health needs are our priority.  As part of our continuing mission to provide you with exceptional heart care, we have created designated Provider Care Teams.  These Care Teams include your primary Cardiologist (physician) and Advanced Practice Providers (APPs -  Physician Assistants and Nurse Practitioners) who all work together to provide you with the care you need, when you need it. You will need a follow up appointment in 3-4  months.  Please call our office 2 months in advance to schedule this appointment.  You may see Dr. Bronson Ing or one of the following Advanced Practice Providers on your designated Care Team:   Bernerd Pho, PA-C [facility]) . Ermalinda Barrios, PA-C ([place])  Any Other Special Instructions Will Be Listed Below (If Applicable). Thank you for choosing [facility]!

## 2018-09-22 NOTE — Progress Notes (Addendum)
CARDIOLOGY CONSULT NOTE  Patient ID: ARIEAL CUOCO MRN: [id number] DOB/AGE: 02/26/1956 63 y.o.  Admit date: (Not on file) Primary Physician: [place], [place] Referring Physician: Dr. Hilma Favors  Reason for Consultation: Palpitations  HPI: Kathryn [name] is a 63 y.o. female who is being seen today for the evaluation of palpitations at the request of Sharilyn Sites, MD.   I personally reviewed an ECG performed on 07/08/2018 which demonstrated sinus rhythm with no ischemic ST segment or T wave abnormalities, nor any arrhythmias.  She has a long history of anxiety.  She was evaluated for atypical chest pain in 2011 deemed secondary to anxiety and stress.  She also has a history of adrenal insufficiency.  She has occasional palpitations primarily when she has a lack of sleep, increased anxiety and stress, or increased caffeine and sugar consumption.  She drinks 5 cans or more of Sun Drop daily.  She has occasional "electric pains "in the center of her chest lasting seconds.  She also has a history of surgery for hiatal hernia and has also had esophageal dilatations.  On further discussion, it appears she has difficulty swallowing solids with food getting stuck in her throat.  She does have a history of GERD as well.  She does not sleep well.  She does not exercise.  She has chronic back pain.  She has diffuse cramps of her ankles, feet, and arms.  Family history: Brother had an MI at age 51.  Father had high cholesterol.  Mother had CHF and atrial fibrillation.     Allergies  Allergen Reactions  . Niacin And Related Shortness Of Breath, Swelling and Other (See Comments)    Neck; skin hot  . Codeine Swelling and Other (See Comments)    tongue  . Zinc Swelling    Current Outpatient Medications  Medication Sig Dispense Refill  . acetaminophen (TYLENOL) 500 MG tablet Take 500-1,000 mg by mouth every 6 (six) hours as needed (For pain.).    Marland Kitchen albuterol  (PROVENTIL HFA;VENTOLIN HFA) 108 (90 Base) MCG/ACT inhaler Inhale 2 puffs into the lungs every 4 (four) hours as needed for wheezing or shortness of breath.    . ALPRAZolam (XANAX) 0.5 MG tablet Take 0.5 mg by mouth as needed for anxiety.     . Cholecalciferol (VITAMIN D-3 PO) Take 1 tablet by mouth daily.    . cyanocobalamin 500 MCG tablet Take 500 mcg by mouth daily.    Marland Kitchen dicyclomine (BENTYL) 10 MG capsule Take 10 mg by mouth as needed (For irritable bowel syndrome.).     Marland Kitchen fluticasone (FLONASE) 50 MCG/ACT nasal spray Place 2 sprays into both nostrils daily.    Marland Kitchen HYDROcodone-homatropine (HYCODAN) 5-1.5 MG/5ML syrup Take 5 mLs by mouth every 4 (four) hours as needed for cough. 120 mL 0  . hydrocortisone (CORTEF) 5 MG tablet Take 5 mg by mouth 2 (two) times daily.    . montelukast (SINGULAIR) 10 MG tablet Take 10 mg by mouth every morning.     . Multiple Vitamins-Minerals (CENTRUM SILVER 50+WOMEN PO) Take by mouth.    . pramipexole (MIRAPEX) 0.125 MG tablet Take 0.125 mg by mouth at bedtime as needed (For restless leg syndrome.).    Marland Kitchen pyridoxine (B-6) 100 MG tablet Take 100 mg by mouth daily.    . traMADol (ULTRAM) 50 MG tablet Take 50-100 mg by mouth every 6 (six) hours as needed (For pain.).     Marland Kitchen valACYclovir (VALTREX) 1000 MG  tablet Take 1,000 mg by mouth daily.     No current facility-administered medications for this visit.     Past Medical History:  Diagnosis Date  . Adrenal insufficiency ([place])    occured following steroid shoulder injections ~ spring 2017  . Anxiety    pt. reports that she has RLS- takes Xanax for calming her self so she can settle & sleep   . Arthritis    shoulder & back, knees & leg    . Asthma    aleery related  . Chest pain, atypical    had cardiac workup - deemed related to anxiety  . Chronic bronchitis ([place])    "less since I quit smoking" (02/21/2016)  . Chronic lower back pain   . Dyspnea    related to spray paint that is put in  beer cans at her place  of employment.  Pt. also reports that she has anxiety also that leads to SOB., Pt. reports her Mother just died a week ago    . GERD (gastroesophageal reflux disease)   . Guillain-Barre syndrome following vaccination ([place]) 1970's   post swine flu shot, hosp. for treatment for 1 month   . H/O dizziness   . H/O hiatal hernia   . Hepatitis    hepatitis as a child; "got it from a little girl at school" make sick and vomiting  . Herpes simplex   . History of Guillain-Barre syndrome ~ 1978   "from the swine flu shot"  . IBS (irritable bowel syndrome)   . Pneumonia   . PONV (postoperative nausea and vomiting)   . Restless leg syndrome   . Walking pneumonia ~ 2011    Past Surgical History:  Procedure Laterality Date  . [place] STUDY N/A 09/23/2016   Procedure: [place] STUDY;  Surgeon: Mauri Pole, MD;  Location: [facility];  Service: Endoscopy;  Laterality: N/A;  . ANTERIOR CERVICAL DECOMP/DISCECTOMY FUSION  2000 & 2015   two times ago- C5 and C6, Dr. Otilio Connors, MD 2015  . APPENDECTOMY  1980s  . BACK SURGERY    . CHOLECYSTECTOMY OPEN  1980s  . COLONOSCOPY N/A 09/20/2014   Procedure: COLONOSCOPY;  Surgeon: Rogene Houston, MD;  Location: [facility];  Service: Endoscopy;  Laterality: N/A;  200  . ESOPHAGEAL MANOMETRY N/A 09/23/2016   Procedure: ESOPHAGEAL MANOMETRY (EM);  Surgeon: Mauri Pole, MD;  Location: [facility];  Service: Endoscopy;  Laterality: N/A;  . ESOPHAGOGASTRODUODENOSCOPY N/A 09/20/2014   Procedure: ESOPHAGOGASTRODUODENOSCOPY (EGD);  Surgeon: Rogene Houston, MD;  [facility];  Service: Endoscopy;  Laterality: N/A;  . HIATAL HERNIA REPAIR N/A 11/27/2016   Procedure: LAPAROSCOPIC REPAIR OF HIATAL HERNIA WITH NISSEN FUNDOPLICATION;  Surgeon: Jackolyn Confer, MD;  Location: [facility];  Service: General;  Laterality: N/A;  . INCISION AND DRAINAGE OF WOUND Left 02/21/2016   forearm, minor  . LUMBAR LAMINECTOMY/DECOMPRESSION MICRODISCECTOMY  Right 06/22/2013   Procedure: RIGHT LUMBAR TWO-THREE DISCECTOMY;  Surgeon: Otilio Connors, MD;  Location: [facility];  Service: Neurosurgery;  Laterality: Right;  Right   . MALONEY DILATION N/A 09/20/2014   Procedure: Venia Minks DILATION;  Surgeon: Rogene Houston, MD;  Location: [facility];  Service: Endoscopy;  Laterality: N/A;  . MINOR IRRIGATION AND DEBRIDEMENT OF WOUND Left 02/21/2016   Procedure: MINOR IRRIGATION AND DEBRIDEMENT/REPAIR OF LEFT ARM WOUND;  Surgeon: Netta Cedars, MD;  Location: [facility];  Service: Orthopedics;  Laterality: Left;  . REVERSE  SHOULDER ARTHROPLASTY Right 02/21/2016   Procedure: RIGHT REVERSE TOTAL SHOULDER ARTHROPLASTY;  Surgeon: Netta Cedars, MD;  Location: [facility];  Service: Orthopedics;  Laterality: Right;  . REVERSE TOTAL SHOULDER ARTHROPLASTY Right 02/21/2016  . SHOULDER ARTHROSCOPY W/ ROTATOR CUFF REPAIR Right 1990s  . TOTAL SHOULDER REPLACEMENT     Right  . VAGINAL HYSTERECTOMY      Social History   Socioeconomic History  . Marital status: Married    Spouse name: Not on file  . Number of children: Not on file  . Years of education: Not on file  . Highest education level: Not on file  Occupational History  . Occupation: Therapist, sports  Social Needs  . Financial resource strain: Not on file  . Food insecurity:    Worry: Not on file    Inability: Not on file  . Transportation needs:    Medical: Not on file    Non-medical: Not on file  Tobacco Use  . Smoking status: Former Smoker    Packs/day: 3.00    Years: 10.00    Pack years: 30.00    Types: Cigarettes  . Smokeless tobacco: Never Used  . Tobacco comment: "quit smoking in ~ 2003"  Substance and Sexual Activity  . Alcohol use: No    Comment: 02/21/2016 "I used to drink a little bit; probably quit in my 20s"  . Drug use: No  . Sexual activity: Yes    Birth control/protection: Surgical    Comment: hyst  Lifestyle  . Physical activity:    Days per week: Not on file    Minutes per session:  Not on file  . Stress: Not on file  Relationships  . Social connections:    Talks on phone: Not on file    Gets together: Not on file    Attends religious service: Not on file    Active member of club or organization: Not on file    Attends meetings of clubs or organizations: Not on file    Relationship status: Not on file  . Intimate partner violence:    Fear of current or ex partner: Not on file    Emotionally abused: Not on file    Physically abused: Not on file    Forced sexual activity: Not on file  Other Topics Concern  . Not on file  Social History Narrative   Walks but no other activity    Anxiety      Current Meds  Medication Sig  . acetaminophen (TYLENOL) 500 MG tablet Take 500-1,000 mg by mouth every 6 (six) hours as needed (For pain.).  Marland Kitchen albuterol (PROVENTIL HFA;VENTOLIN HFA) 108 (90 Base) MCG/ACT inhaler Inhale 2 puffs into the lungs every 4 (four) hours as needed for wheezing or shortness of breath.  . ALPRAZolam (XANAX) 0.5 MG tablet Take 0.5 mg by mouth as needed for anxiety.   . Cholecalciferol (VITAMIN D-3 PO) Take 1 tablet by mouth daily.  . cyanocobalamin 500 MCG tablet Take 500 mcg by mouth daily.  Marland Kitchen dicyclomine (BENTYL) 10 MG capsule Take 10 mg by mouth as needed (For irritable bowel syndrome.).   Marland Kitchen fluticasone (FLONASE) 50 MCG/ACT nasal spray Place 2 sprays into both nostrils daily.  Marland Kitchen HYDROcodone-homatropine (HYCODAN) 5-1.5 MG/5ML syrup Take 5 mLs by mouth every 4 (four) hours as needed for cough.  . hydrocortisone (CORTEF) 5 MG tablet Take 5 mg by mouth 2 (two) times daily.  . montelukast (SINGULAIR) 10 MG tablet Take 10 mg by mouth every morning.   Marland Kitchen  Multiple Vitamins-Minerals (CENTRUM SILVER 50+WOMEN PO) Take by mouth.  . pramipexole (MIRAPEX) 0.125 MG tablet Take 0.125 mg by mouth at bedtime as needed (For restless leg syndrome.).  Marland Kitchen pyridoxine (B-6) 100 MG tablet Take 100 mg by mouth daily.  . traMADol (ULTRAM) 50 MG tablet Take 50-100 mg by mouth  every 6 (six) hours as needed (For pain.).   Marland Kitchen valACYclovir (VALTREX) 1000 MG tablet Take 1,000 mg by mouth daily.    A nurse/CMA was present throughout the physical exam.   Review of systems complete and found to be negative unless listed above in HPI    Physical exam Blood pressure 128/72, pulse 74, height 5\' 7"  (1.702 m), weight 149 lb (67.6 kg), SpO2 98 %. General: NAD Neck: No JVD, no thyromegaly or thyroid nodule.  Lungs: Clear to auscultation bilaterally with normal respiratory effort. CV: Nondisplaced PMI. Regular rate and rhythm, normal S1/S2, no S3/S4, no murmur.  No peripheral edema.  No carotid bruit.    Abdomen: Soft, nontender, no distention.  Skin: Intact without lesions or rashes.  Neurologic: Alert and oriented x 3.  Psych: Normal affect. Extremities: No clubbing or cyanosis.  HEENT: Normal.   ECG: Most recent ECG reviewed.   Labs: Lab Results  Component Value Date/Time   K 4.4 11/24/2016 01:30 PM   BUN 12 11/24/2016 01:30 PM   CREATININE 0.83 11/27/2016 09:07 PM   ALT 61 (H) 11/24/2016 01:30 PM   HGB 12.4 11/27/2016 10:22 PM     Lipids: No results found for: LDLCALC, LDLDIRECT, CHOL, TRIG, HDL      ASSESSMENT AND PLAN:  1.  Chest pain: Symptoms appear atypical for ischemic heart disease and that they last seconds and are primarily nonexertional.  She does have dysphagia for solids and has a history of hiatal hernia surgery and esophageal dilatation.  I recommended she go back to gastroenterology for another evaluation as she likely needs an EGD.  She does have a strong family history of heart disease.  I will obtain an exercise tolerance test.  2.  Dysphagia for solids: She does have dysphagia for solids and has a history of hiatal hernia surgery and esophageal dilatation.  I recommended she go back to gastroenterology for another evaluation as she likely needs an EGD.  3.  Palpitations: Symptoms are seldom and short-lived and are exacerbated with  decreased sleep, increased caffeine and sugar consumption, and increased anxiety and stress.  I asked her to reduce caffeine and sugar consumption and to increase water intake.  For the time being I will hold off on cardiac monitoring.     Disposition: Follow up in 3 months  Signed: Kate Sable, M.D., F.A.C.C.  09/22/2018, 9:28 AM

## 2018-09-26 ENCOUNTER — Ambulatory Visit (HOSPITAL_COMMUNITY)
Admission: RE | Admit: 2018-09-26 | Discharge: 2018-09-26 | Disposition: A | Discharge status: Home / Self Care | Payer: PPO | Source: Ambulatory Visit | Attending: Cardiovascular Disease | Admitting: Cardiovascular Disease

## 2018-09-26 DIAGNOSIS — R079 Chest pain, unspecified: Secondary | ICD-10-CM | POA: Insufficient documentation

## 2018-09-26 LAB — EXERCISE TOLERANCE TEST
Estimated workload: 10.4 METS
Exercise duration (min): 8 min
Exercise duration (sec): 59 s
MPHR: 157 {beats}/min
Peak HR: 151 {beats}/min
Percent HR: 96 %
RPE: 13
Rest HR: 70 {beats}/min

## 2019-01-03 ENCOUNTER — Telehealth (INDEPENDENT_AMBULATORY_CARE_PROVIDER_SITE_OTHER): Payer: PPO | Admitting: Cardiovascular Disease

## 2019-01-03 ENCOUNTER — Encounter: Payer: Self-pay | Admitting: Cardiovascular Disease

## 2019-01-03 VITALS — BP 127/70 | HR 67 | Ht 67.0 in | Wt 152.0 lb

## 2019-01-03 DIAGNOSIS — R1310 Dysphagia, unspecified: Secondary | ICD-10-CM

## 2019-01-03 DIAGNOSIS — R079 Chest pain, unspecified: Secondary | ICD-10-CM | POA: Diagnosis not present

## 2019-01-03 DIAGNOSIS — K219 Gastro-esophageal reflux disease without esophagitis: Secondary | ICD-10-CM

## 2019-01-03 DIAGNOSIS — R002 Palpitations: Secondary | ICD-10-CM

## 2019-01-03 MED ORDER — PANTOPRAZOLE SODIUM 40 MG PO TBEC: 40.0000 mg | DELAYED_RELEASE_TABLET | Freq: Every day | ORAL | 1 refills | Status: DC

## 2019-01-03 NOTE — Patient Instructions (Addendum)
Medication Instructions:   Your physician recommends that you continue on your current medications as directed. Please refer to the Current Medication list given to you today.  Labwork:  NONE  Testing/Procedures:  NONE  Follow-Up:  Your physician recommends that you schedule a follow-up appointment in: as needed.  Any Other Special Instructions Will Be Listed Below (If Applicable).  Your physician has given you information to help you follow a heart health diet. Please use this as a guide.  If you need a refill on your cardiac medications before your next appointment, please call your pharmacy. Heart-Healthy Eating Plan Heart-healthy meal planning includes:  Eating less unhealthy fats.  Eating more healthy fats.  Making other changes in your diet. Talk with your doctor or a diet specialist (dietitian) to create an eating plan that is right for you. What is my plan? Your doctor may recommend an eating plan that includes:  Total fat: ______% or less of total calories a day.  Saturated fat: ______% or less of total calories a day.  Cholesterol: less than _________mg a day. What are tips for following this plan? Cooking Avoid frying your food. Try to bake, boil, grill, or broil it instead. You can also reduce fat by:  Removing the skin from poultry.  Removing all visible fats from meats.  Steaming vegetables in water or broth. Meal planning   At meals, divide your plate into four equal parts: ? Fill one-half of your plate with vegetables and green salads. ? Fill one-fourth of your plate with whole grains. ? Fill one-fourth of your plate with lean protein foods.  Eat 4-5 servings of vegetables per day. A serving of vegetables is: ? 1 cup of raw or cooked vegetables. ? 2 cups of raw leafy greens.  Eat 4-5 servings of fruit per day. A serving of fruit is: ? 1 medium whole fruit. ?  cup of dried fruit. ?  cup of fresh, frozen, or canned fruit. ?  cup of 100%  fruit juice.  Eat more foods that have soluble fiber. These are apples, broccoli, carrots, beans, peas, and barley. Try to get 20-30 g of fiber per day.  Eat 4-5 servings of nuts, legumes, and seeds per week: ? 1 serving of dried beans or legumes equals  cup after being cooked. ? 1 serving of nuts is  cup. ? 1 serving of seeds equals 1 tablespoon. General information  Eat more home-cooked food. Eat less restaurant, buffet, and fast food.  Limit or avoid alcohol.  Limit foods that are high in starch and sugar.  Avoid fried foods.  Lose weight if you are overweight.  Keep track of how much salt (sodium) you eat. This is important if you have high blood pressure. Ask your doctor to tell you more about this.  Try to add vegetarian meals each week. Fats  Choose healthy fats. These include olive oil and canola oil, flaxseeds, walnuts, almonds, and seeds.  Eat more omega-3 fats. These include salmon, mackerel, sardines, tuna, flaxseed oil, and ground flaxseeds. Try to eat fish at least 2 times each week.  Check food labels. Avoid foods with trans fats or high amounts of saturated fat.  Limit saturated fats. ? These are often found in animal products, such as meats, butter, and cream. ? These are also found in plant foods, such as palm oil, palm kernel oil, and coconut oil.  Avoid foods with partially hydrogenated oils in them. These have trans fats. Examples are stick margarine, some tub  margarines, cookies, crackers, and other baked goods. What foods can I eat? Fruits All fresh, canned (in natural juice), or frozen fruits. Vegetables Fresh or frozen vegetables (raw, steamed, roasted, or grilled). Green salads. Grains Most grains. Choose whole wheat and whole grains most of the time. Rice and pasta, including brown rice and pastas made with whole wheat. Meats and other proteins Lean, well-trimmed beef, veal, pork, and lamb. Chicken and [place] without skin. All fish and  shellfish. Wild duck, rabbit, pheasant, and venison. Egg whites or low-cholesterol egg substitutes. Dried beans, peas, lentils, and tofu. Seeds and most nuts. Dairy Low-fat or nonfat cheeses, including ricotta and mozzarella. Skim or 1% milk that is liquid, powdered, or evaporated. Buttermilk that is made with low-fat milk. Nonfat or low-fat yogurt. Fats and oils Non-hydrogenated (trans-free) margarines. Vegetable oils, including soybean, sesame, sunflower, olive, peanut, safflower, corn, canola, and cottonseed. Salad dressings or mayonnaise made with a vegetable oil. Beverages Mineral water. Coffee and tea. Diet carbonated beverages. Sweets and desserts Sherbet, gelatin, and fruit ice. Small amounts of dark chocolate. Limit all sweets and desserts. Seasonings and condiments All seasonings and condiments. The items listed above may not be a complete list of foods and drinks you can eat. Contact a dietitian for more options. What foods should I avoid? Fruits Canned fruit in heavy syrup. Fruit in cream or butter sauce. Fried fruit. Limit coconut. Vegetables Vegetables cooked in cheese, cream, or butter sauce. Fried vegetables. Grains Breads that are made with saturated or trans fats, oils, or whole milk. Croissants. Sweet rolls. Donuts. High-fat crackers, such as cheese crackers. Meats and other proteins Fatty meats, such as hot dogs, ribs, sausage, bacon, rib-eye roast or steak. High-fat deli meats, such as salami and bologna. Caviar. Domestic duck and goose. Organ meats, such as liver. Dairy Cream, sour cream, cream cheese, and creamed cottage cheese. Whole-milk cheeses. Whole or 2% milk that is liquid, evaporated, or condensed. Whole buttermilk. Cream sauce or high-fat cheese sauce. Yogurt that is made from whole milk. Fats and oils Meat fat, or shortening. Cocoa butter, hydrogenated oils, palm oil, coconut oil, palm kernel oil. Solid fats and shortenings, including bacon fat, salt pork,  lard, and butter. Nondairy cream substitutes. Salad dressings with cheese or sour cream. Beverages Regular sodas and juice drinks with added sugar. Sweets and desserts Frosting. Pudding. Cookies. Cakes. Pies. Milk chocolate or white chocolate. Buttered syrups. Full-fat ice cream or ice cream drinks. The items listed above may not be a complete list of foods and drinks to avoid. Contact a dietitian for more information. Summary  Heart-healthy meal planning includes eating less unhealthy fats, eating more healthy fats, and making other changes in your diet.  Eat a balanced diet. This includes fruits and vegetables, low-fat or nonfat dairy, lean protein, nuts and legumes, whole grains, and heart-healthy oils and fats. This information is not intended to replace advice given to you by your health care provider. Make sure you discuss any questions you have with your health care provider. Document Released: 02/23/2012 Document Revised: 10/01/2017 Document Reviewed: 10/01/2017 Elsevier Interactive Patient Education  2019 [employer].

## 2019-01-03 NOTE — Addendum Note (Signed)
Addended by: Merlene Laughter on: 01/03/2019 10:25 AM   Modules accepted: Orders

## 2019-01-03 NOTE — Progress Notes (Signed)
Virtual Visit via Video Note   This visit type was conducted due to national recommendations for restrictions regarding the COVID-19 Pandemic (e.g. social distancing) in an effort to limit this patient's exposure and mitigate transmission in our community.  Due to her co-morbid illnesses, this patient is at least at moderate risk for complications without adequate follow up.  This format is felt to be most appropriate for this patient at this time.  All issues noted in this document were discussed and addressed.  A limited physical exam was performed with this format.  Please refer to the patient's chart for her consent to telehealth for [facility].   Evaluation Performed:  Follow-up visit  Date:  01/03/2019   [name], [name] March 22, 1956, MRN [id number]  Patient Location: Home Provider Location: Home  PCP:  [facility]  Cardiologist:  No primary care provider on file.  Electrophysiologist:  None   Chief Complaint:  Palpitations  History of Present Illness:    Kathryn [name] is a 63 y.o. female with a h/o palpitations, anxiety, adrenal insufficiency, and atypical chest pain. She also has a history of surgery for hiatal hernia and has also had esophageal dilatations.  She is doing well. She stopped drinking Sun Drop and now drinks decaf tea. Says she sleeps better. Has had some issues with GERD recently and started taking an older Protonix bottle (40 mg daily). Says calf/feet cramping has improved. Has questions about a heart healthy diet. Denies palpitations.  The patient does not have symptoms concerning for COVID-19 infection (fever, chills, cough, or new shortness of breath).    Past Medical History:  Diagnosis Date  . Adrenal insufficiency ([place])    occured following steroid shoulder injections ~ spring 2017  . Anxiety    pt. reports that she has RLS- takes Xanax for calming her self so she can settle & sleep   . Arthritis    shoulder & back,  knees & leg    . Asthma    aleery related  . Chest pain, atypical    had cardiac workup - deemed related to anxiety  . Chronic bronchitis ([place])    "less since I quit smoking" (02/21/2016)  . Chronic lower back pain   . Dyspnea    related to spray paint that is put in  beer cans at her place of employment.  Pt. also reports that she has anxiety also that leads to SOB., Pt. reports her Mother just died a week ago    . GERD (gastroesophageal reflux disease)   . Guillain-Barre syndrome following vaccination ([place]) 1970's   post swine flu shot, hosp. for treatment for 1 month   . H/O dizziness   . H/O hiatal hernia   . Hepatitis    hepatitis as a child; "got it from a little girl at school" make sick and vomiting  . Herpes simplex   . History of Guillain-Barre syndrome ~ 1978   "from the swine flu shot"  . IBS (irritable bowel syndrome)   . Pneumonia   . PONV (postoperative nausea and vomiting)   . Restless leg syndrome   . Walking pneumonia ~ 2011   Past Surgical History:  Procedure Laterality Date  . [place] STUDY N/A 09/23/2016   Procedure: [place] STUDY;  Surgeon: Mauri Pole, MD;  [facility];  Service: Endoscopy;  Laterality: N/A;  . ANTERIOR CERVICAL DECOMP/DISCECTOMY FUSION  2000 & 2015   two times  ago- C5 and C6, Dr. Otilio Connors, MD 2015  . APPENDECTOMY  1980s  . BACK SURGERY    . CHOLECYSTECTOMY OPEN  1980s  . COLONOSCOPY N/A 09/20/2014   Procedure: COLONOSCOPY;  Surgeon: Rogene Houston, MD;  [facility];  Service: Endoscopy;  Laterality: N/A;  200  . ESOPHAGEAL MANOMETRY N/A 09/23/2016   Procedure: ESOPHAGEAL MANOMETRY (EM);  Surgeon: Mauri Pole, MD;  [facility];  Service: Endoscopy;  Laterality: N/A;  . ESOPHAGOGASTRODUODENOSCOPY N/A 09/20/2014   Procedure: ESOPHAGOGASTRODUODENOSCOPY (EGD);  Surgeon: Rogene Houston, MD;  [facility];  Service: Endoscopy;  Laterality: N/A;  . HIATAL HERNIA REPAIR N/A  11/27/2016   Procedure: LAPAROSCOPIC REPAIR OF HIATAL HERNIA WITH NISSEN FUNDOPLICATION;  Surgeon: Jackolyn Confer, MD;  [facility];  Service: General;  Laterality: N/A;  . INCISION AND DRAINAGE OF WOUND Left 02/21/2016   forearm, minor  . LUMBAR LAMINECTOMY/DECOMPRESSION MICRODISCECTOMY Right 06/22/2013   Procedure: RIGHT LUMBAR TWO-THREE DISCECTOMY;  Surgeon: Otilio Connors, MD;  Location: [place] [facility];  Service: Neurosurgery;  Laterality: Right;  Right   . MALONEY DILATION N/A 09/20/2014   Procedure: Venia Minks DILATION;  Surgeon: Rogene Houston, MD;  [facility];  Service: Endoscopy;  Laterality: N/A;  . MINOR IRRIGATION AND DEBRIDEMENT OF WOUND Left 02/21/2016   Procedure: MINOR IRRIGATION AND DEBRIDEMENT/REPAIR OF LEFT ARM WOUND;  Surgeon: Netta Cedars, MD;  Location: [place];  Service: Orthopedics;  Laterality: Left;  . REVERSE SHOULDER ARTHROPLASTY Right 02/21/2016   Procedure: RIGHT REVERSE TOTAL SHOULDER ARTHROPLASTY;  Surgeon: Netta Cedars, MD;  Location: [place];  Service: Orthopedics;  Laterality: Right;  . REVERSE TOTAL SHOULDER ARTHROPLASTY Right 02/21/2016  . SHOULDER ARTHROSCOPY W/ ROTATOR CUFF REPAIR Right 1990s  . TOTAL SHOULDER REPLACEMENT     Right  . VAGINAL HYSTERECTOMY       Current Meds  Medication Sig  . acetaminophen (TYLENOL) 500 MG tablet Take 500-1,000 mg by mouth every 6 (six) hours as needed (For pain.).  Marland Kitchen albuterol (PROVENTIL HFA;VENTOLIN HFA) 108 (90 Base) MCG/ACT inhaler Inhale 2 puffs into the lungs every 4 (four) hours as needed for wheezing or shortness of breath.  . ALPRAZolam (XANAX) 0.5 MG tablet Take 0.5 mg by mouth as needed for anxiety.   . Calcium Carbonate-Vitamin D (CALTRATE 600+D PO) Take 1 tablet by mouth daily.  . Cholecalciferol (VITAMIN D-3 PO) Take 1 tablet by mouth daily.  Marland Kitchen dicyclomine (BENTYL) 10 MG capsule Take 10 mg by mouth as needed (For irritable bowel syndrome.).   Marland Kitchen fluticasone (FLONASE) 50 MCG/ACT nasal spray Place 2  sprays into both nostrils daily.  . hydrocortisone (CORTEF) 5 MG tablet Take 5 mg by mouth 2 (two) times daily.  . montelukast (SINGULAIR) 10 MG tablet Take 10 mg by mouth every morning.   . Multiple Vitamin (MULTIVITAMIN) tablet Take 1 tablet by mouth daily.  . Multiple Vitamins-Minerals (CENTRUM SILVER 50+WOMEN PO) Take by mouth.  . traMADol (ULTRAM) 50 MG tablet Take 50-100 mg by mouth every 6 (six) hours as needed (For pain.).   Marland Kitchen valACYclovir (VALTREX) 1000 MG tablet Take 1,000 mg by mouth daily.  . vitamin C (ASCORBIC ACID) 500 MG tablet Take 500 mg by mouth daily.     Allergies:   Niacin and related; Codeine; and Zinc   Social History   Tobacco Use  . Smoking status: Former Smoker    Packs/day: 3.00    Years: 10.00    Pack years:  30.00    Types: Cigarettes  . Smokeless tobacco: Never Used  . Tobacco comment: "quit smoking in ~ 2003"  Substance Use Topics  . Alcohol use: No    Comment: 02/21/2016 "I used to drink a little bit; probably quit in my 20s"  . Drug use: No     Family Hx: The patient's family history includes Alzheimer's disease in her paternal grandmother; Atrial fibrillation in her mother; Cancer in her father; Congestive Heart Failure in her mother; Diabetes in her paternal grandfather; Heart attack in her brother; Hiatal hernia in her mother; Stroke in her paternal grandfather.  ROS:   Please see the history of present illness.     All other systems reviewed and are negative.   Prior CV studies:   The following studies were reviewed today:  Stress test (09/26/18):   Blood pressure demonstrated a hypertensive response to exercise.  There was no ST segment deviation noted during stress.   Normal ETT No significant arrhythmia Normal hemodynamic response to exercise  Labs/Other Tests and Data Reviewed:    EKG:  No ECG reviewed.  Recent Labs: No results found for requested labs within last 8760 hours.   Recent Lipid Panel No results found for:  CHOL, TRIG, HDL, CHOLHDL, LDLCALC, LDLDIRECT  Wt Readings from Last 3 Encounters:  01/03/19 152 lb (68.9 kg)  09/22/18 149 lb (67.6 kg)  07/05/17 153 lb 1 oz (69.4 kg)     Objective:    Vital Signs:  BP 127/70   Pulse 67   Ht 5\' 7"  (1.702 m)   Wt 152 lb (68.9 kg)   BMI 23.81 kg/m     VITAL SIGNS:  reviewed  Gen: NAD HEENT: [place]/AT, [place] Neck: No JVD Neuro: Non focal. Moves extremities spontaneously. Musculoskeletal: No gross deformities.  ASSESSMENT & PLAN:    1.  Chest pain: Symptoms appear atypical for ischemic heart disease and that they last seconds and are primarily nonexertional.  She does have dysphagia for solids and has a history of hiatal hernia surgery and esophageal dilatation.  I again recommended she go back to gastroenterology for another evaluation as she likely needs an EGD.   I will refill Protonix 40 mg daily. Normal stress test. No further cardiac testing is indicated.  2.  Dysphagia for solids/GERD: She does have dysphagia for solids and has a history of hiatal hernia surgery and esophageal dilatation.  again  recommended she go back to gastroenterology for another evaluation as she likely needs an EGD. I will refill Protonix 40 mg daily.  3.  Palpitations: Symptoms are seldom and short-lived and are exacerbated with decreased sleep, increased caffeine and sugar consumption, and increased anxiety and stress. Symptoms have improved. I will hold off on cardiac monitoring.   COVID-19 Education: The signs and symptoms of COVID-19 were discussed with the patient and how to seek care for testing (follow up with PCP or arrange E-visit).  The importance of social distancing was discussed today.  Time:   Today, I have spent 25 minutes with the patient with telehealth technology discussing the above problems.     Medication Adjustments/Labs and Tests Ordered: Current medicines are reviewed at length with the patient today.  Concerns regarding medicines are  outlined above.   Tests Ordered: No orders of the defined types were placed in this encounter.   Medication Changes: No orders of the defined types were placed in this encounter.   Disposition:  Follow up prn  Signed, Kate Sable, MD  01/03/2019 10:15 AM    [place]

## 2019-02-06 DIAGNOSIS — E2749 Other adrenocortical insufficiency: Secondary | ICD-10-CM | POA: Diagnosis not present

## 2019-02-06 DIAGNOSIS — R5383 Other fatigue: Secondary | ICD-10-CM | POA: Diagnosis not present

## 2019-02-10 DIAGNOSIS — E2749 Other adrenocortical insufficiency: Secondary | ICD-10-CM | POA: Diagnosis not present

## 2019-02-10 DIAGNOSIS — R5383 Other fatigue: Secondary | ICD-10-CM | POA: Diagnosis not present

## 2019-02-10 DIAGNOSIS — E039 Hypothyroidism, unspecified: Secondary | ICD-10-CM | POA: Diagnosis not present

## 2019-03-02 ENCOUNTER — Other Ambulatory Visit (HOSPITAL_COMMUNITY): Payer: Self-pay | Admitting: Family Medicine

## 2019-03-02 DIAGNOSIS — Z1231 Encounter for screening mammogram for malignant neoplasm of breast: Secondary | ICD-10-CM

## 2019-04-14 DIAGNOSIS — R5383 Other fatigue: Secondary | ICD-10-CM | POA: Diagnosis not present

## 2019-04-14 DIAGNOSIS — E2749 Other adrenocortical insufficiency: Secondary | ICD-10-CM | POA: Diagnosis not present

## 2019-04-27 ENCOUNTER — Ambulatory Visit (INDEPENDENT_AMBULATORY_CARE_PROVIDER_SITE_OTHER): Payer: PPO | Admitting: Nurse Practitioner

## 2019-04-27 ENCOUNTER — Encounter (INDEPENDENT_AMBULATORY_CARE_PROVIDER_SITE_OTHER): Payer: Self-pay | Admitting: Nurse Practitioner

## 2019-04-27 ENCOUNTER — Other Ambulatory Visit: Payer: Self-pay

## 2019-04-27 ENCOUNTER — Encounter (INDEPENDENT_AMBULATORY_CARE_PROVIDER_SITE_OTHER): Payer: Self-pay | Admitting: *Deleted

## 2019-04-27 VITALS — BP 136/83 | HR 69 | Temp 98.0°F | Resp 18 | Ht 67.0 in | Wt 156.9 lb

## 2019-04-27 DIAGNOSIS — R202 Paresthesia of skin: Secondary | ICD-10-CM | POA: Insufficient documentation

## 2019-04-27 DIAGNOSIS — K5900 Constipation, unspecified: Secondary | ICD-10-CM | POA: Insufficient documentation

## 2019-04-27 DIAGNOSIS — K5904 Chronic idiopathic constipation: Secondary | ICD-10-CM | POA: Diagnosis not present

## 2019-04-27 DIAGNOSIS — K5909 Other constipation: Secondary | ICD-10-CM | POA: Diagnosis not present

## 2019-04-27 DIAGNOSIS — R1310 Dysphagia, unspecified: Secondary | ICD-10-CM

## 2019-04-27 DIAGNOSIS — K219 Gastro-esophageal reflux disease without esophagitis: Secondary | ICD-10-CM

## 2019-04-27 DIAGNOSIS — R1319 Other dysphagia: Secondary | ICD-10-CM

## 2019-04-27 MED ORDER — PANTOPRAZOLE SODIUM 40 MG PO TBEC: 40.0000 mg | DELAYED_RELEASE_TABLET | Freq: Every day | ORAL | 1 refills | Status: DC

## 2019-04-27 NOTE — Progress Notes (Signed)
Subjective:    Patient ID: Kathryn [name], female    DOB: Aug 02, 1956, 63 y.o.   MRN: [id number]  HPI Patient with a past medical history significant for anxiety, arthritis, atypical chest pain, dysphagia, past esophageal dilatations, GERD, s/p Nissen Fundoplication 6962, adrenal insufficiency, restless leg syndrome and past Guillain Barre syndrome. She presents today with complaints of heartburn for the past month. She is also having some difficulty swallowing, feels she has to work harder to swallow but food is not getting stuck. She is unable to tell me how often this is happening. She does not want to have another EGD. She is taking Pantoprazole 40mg  once daily. She feels bloated after eating. No nausea or vomiting. She complains of having constipation, passing several balls of stool throughout the day, sometimes skips a day. No rectal bleeding or black stool.  EGD 09/20/2014: Esophagus:  Mucosa of the esophagus was normal. GE junction was unremarkable without ring or stricture formation. GEJ:  38 cm Hiatus:  40 cm Stomach:  Stomach was empty and distended very well with insufflation. Folds in the proximal stomach are normal. Examination mucosa gastric bite was normal. Mucosa at antrum was abnormal with the mortise diffuse changes of edema and erythema. No erosions or ulcers are noted. Pyloric channel was patent. Angularis fundus and cardia were normal. Duodenum:  Bulbar and post bulbar mucosa was normal.  Therapeutic/Diagnostic Maneuvers Performed:   Esophagus was dilated by passing 54 French Maloney dilator to full insertion. Endoscope was passed again and mucosa reexamined and no mucosal disruption noted. Antral biopsy was taken for routine histology. Endoscope was then withdrawn and patient appear for procedure #2.  COLONOSCOPY 09/20/2014: Prep was excellent except she had thick layer of stool coating cecal and ascending colon mucosa. Cecal landmarks were well identified after vigorous  washing. Small lymphoid polyp ablated via cold biopsy from appendiceal stump. Mucosa of rest of the colon was normal. Normal mucosa of rectum and anorectal junction. Recall colonoscopy 10 years recommended.  Stress test (09/26/18):   Blood pressure demonstrated a hypertensive response to exercise.  There was no ST segment deviation noted during stress.  Normal ETT No significant arrhythmia Normal hemodynamic response to exercise  Past Medical History:  Diagnosis Date  . Adrenal insufficiency ([place])    occured following steroid shoulder injections ~ spring 2017  . Anxiety    pt. reports that she has RLS- takes Xanax for calming her self so she can settle & sleep   . Arthritis    shoulder & back, knees & leg    . Asthma    aleery related  . Chest pain, atypical    had cardiac workup - deemed related to anxiety  . Chronic bronchitis ([place])    "less since I quit smoking" (02/21/2016)  . Chronic lower back pain   . Dyspnea    related to spray paint that is put in  beer cans at her place of employment.  Pt. also reports that she has anxiety also that leads to SOB., Pt. reports her Mother just died a week ago    . GERD (gastroesophageal reflux disease)   . Guillain-Barre syndrome following vaccination ([place]) 1970's   post swine flu shot, hosp. for treatment for 1 month   . H/O dizziness   . H/O hiatal hernia   . Hepatitis    hepatitis as a child; "got it from a little girl at school" make sick and vomiting  . Herpes simplex   . History of  Guillain-Barre syndrome ~ 1978   "from the swine flu shot"  . IBS (irritable bowel syndrome)   . Pneumonia   . PONV (postoperative nausea and vomiting)   . Restless leg syndrome   . Walking pneumonia ~ 2011   Past Surgical History:  Procedure Laterality Date  . [place] STUDY N/A 09/23/2016   Procedure: [place] STUDY;  Surgeon: Mauri Pole, MD;  [facility];  Service: Endoscopy;  Laterality: N/A;  . ANTERIOR  CERVICAL DECOMP/DISCECTOMY FUSION  2000 & 2015   two times ago- C5 and C6, Dr. Otilio Connors, MD 2015  . APPENDECTOMY  1980s  . BACK SURGERY    . CHOLECYSTECTOMY OPEN  1980s  . COLONOSCOPY N/A 09/20/2014   Procedure: COLONOSCOPY;  Surgeon: Rogene Houston, MD;  [facility];  Service: Endoscopy;  Laterality: N/A;  200  . ESOPHAGEAL MANOMETRY N/A 09/23/2016   Procedure: ESOPHAGEAL MANOMETRY (EM);  Surgeon: Mauri Pole, MD;  [facility];  Service: Endoscopy;  Laterality: N/A;  . ESOPHAGOGASTRODUODENOSCOPY N/A 09/20/2014   Procedure: ESOPHAGOGASTRODUODENOSCOPY (EGD);  Surgeon: Rogene Houston, MD;  [facility];  Service: Endoscopy;  Laterality: N/A;  . HIATAL HERNIA REPAIR N/A 11/27/2016   Procedure: LAPAROSCOPIC REPAIR OF HIATAL HERNIA WITH NISSEN FUNDOPLICATION;  Surgeon: Jackolyn Confer, MD;  [facility];  Service: General;  Laterality: N/A;  . INCISION AND DRAINAGE OF WOUND Left 02/21/2016   forearm, minor  . LUMBAR LAMINECTOMY/DECOMPRESSION MICRODISCECTOMY Right 06/22/2013   Procedure: RIGHT LUMBAR TWO-THREE DISCECTOMY;  Surgeon: Otilio Connors, MD;  Location: [place] [facility];  Service: Neurosurgery;  Laterality: Right;  Right   . MALONEY DILATION N/A 09/20/2014   Procedure: Venia Minks DILATION;  Surgeon: Rogene Houston, MD;  [facility];  Service: Endoscopy;  Laterality: N/A;  . MINOR IRRIGATION AND DEBRIDEMENT OF WOUND Left 02/21/2016   Procedure: MINOR IRRIGATION AND DEBRIDEMENT/REPAIR OF LEFT ARM WOUND;  Surgeon: Netta Cedars, MD;  Location: [place];  Service: Orthopedics;  Laterality: Left;  . REVERSE SHOULDER ARTHROPLASTY Right 02/21/2016   Procedure: RIGHT REVERSE TOTAL SHOULDER ARTHROPLASTY;  Surgeon: Netta Cedars, MD;  Location: [place];  Service: Orthopedics;  Laterality: Right;  . REVERSE TOTAL SHOULDER ARTHROPLASTY Right 02/21/2016  . SHOULDER ARTHROSCOPY W/ ROTATOR CUFF REPAIR Right 1990s  . TOTAL SHOULDER REPLACEMENT     Right  .  VAGINAL HYSTERECTOMY     Current Outpatient Medications on File Prior to Visit  Medication Sig Dispense Refill  . acetaminophen (TYLENOL) 500 MG tablet Take 500-1,000 mg by mouth every 6 (six) hours as needed (For pain.).    Marland Kitchen albuterol (PROVENTIL HFA;VENTOLIN HFA) 108 (90 Base) MCG/ACT inhaler Inhale 2 puffs into the lungs every 4 (four) hours as needed for wheezing or shortness of breath.    . ALPRAZolam (XANAX) 0.5 MG tablet Take 0.5 mg by mouth as needed for anxiety.     Marland Kitchen BIOTIN PO Take by mouth daily.    . Calcium Carbonate-Vitamin D (CALTRATE 600+D PO) Take 1 tablet by mouth daily.    . Cholecalciferol (VITAMIN D-3 PO) Take 1 tablet by mouth daily.    Marland Kitchen dicyclomine (BENTYL) 10 MG capsule Take 10 mg by mouth as needed (For irritable bowel syndrome.).     Marland Kitchen fluticasone (FLONASE) 50 MCG/ACT nasal spray Place 2 sprays into both nostrils daily.    Marland Kitchen levothyroxine (SYNTHROID) 25 MCG tablet Take 25 mcg by mouth daily before breakfast.    .  loratadine (CLARITIN) 10 MG tablet Take 10 mg by mouth daily.    . montelukast (SINGULAIR) 10 MG tablet Take 10 mg by mouth every morning.     . Multiple Vitamins-Minerals (CENTRUM SILVER 50+WOMEN PO) Take by mouth.    . traMADol (ULTRAM) 50 MG tablet Take 50-100 mg by mouth every 6 (six) hours as needed (For pain.).     Marland Kitchen valACYclovir (VALTREX) 1000 MG tablet Take 1,000 mg by mouth daily.    . vitamin C (ASCORBIC ACID) 500 MG tablet Take 500 mg by mouth daily.     No current facility-administered medications on file prior to visit.    Allergies  Allergen Reactions  . Niacin And Related Shortness Of Breath, Swelling and Other (See Comments)    Neck; skin hot  . Codeine Swelling and Other (See Comments)    tongue  . Zinc Swelling       Objective:   Physical Exam Blood pressure 136/83, pulse 69, temperature 98 F (36.7 C), temperature source Oral, resp. rate 18, height 5\' 7"  (1.702 m), weight 156 lb 14.4 oz (71.2 kg). General: 63 year old female  in well developed in NAD Eyes: sclera nonicteric, conjunctiva pink Neck: supple Heart: RRR, no murmur Lungs: clear throughout  Abdomen: soft, nontender, no masses or organomegaly Extremities: no edema.     Assessment & Plan:  1. GERD with dysphagia, past Nissen Fundoplication -Pantoprazole 40mg  once daily -Barium Swallow with tablet  -Patient declines EGD for now  2. Constipation -Miralax 1 capful mixed in 8 ounces of water at bed time -Phillip's bacteria probiotic once daily  -Follow up in office in 2 months

## 2019-04-27 NOTE — Patient Instructions (Signed)
1. Continue Pantoprazole 40mg  once daily  2. Miralax 1 capful mixed in 8 ounces of water at bed time. Benefiber 1 tablespoon once daily.  3. Probiotic of choice once daily  4. Schedule a barium swallow study   5. Follow up in office in 2 months and as needed

## 2019-04-28 DIAGNOSIS — K5904 Chronic idiopathic constipation: Secondary | ICD-10-CM | POA: Diagnosis not present

## 2019-04-28 DIAGNOSIS — K219 Gastro-esophageal reflux disease without esophagitis: Secondary | ICD-10-CM | POA: Diagnosis not present

## 2019-04-28 DIAGNOSIS — R202 Paresthesia of skin: Secondary | ICD-10-CM | POA: Diagnosis not present

## 2019-05-01 ENCOUNTER — Ambulatory Visit (HOSPITAL_COMMUNITY)
Admission: RE | Admit: 2019-05-01 | Discharge: 2019-05-01 | Disposition: A | Discharge status: Home / Self Care | Payer: PPO | Source: Ambulatory Visit | Attending: Nurse Practitioner | Admitting: Nurse Practitioner

## 2019-05-01 ENCOUNTER — Other Ambulatory Visit: Payer: Self-pay

## 2019-05-01 DIAGNOSIS — R1319 Other dysphagia: Secondary | ICD-10-CM

## 2019-05-01 DIAGNOSIS — R1310 Dysphagia, unspecified: Secondary | ICD-10-CM | POA: Insufficient documentation

## 2019-05-01 DIAGNOSIS — R1314 Dysphagia, pharyngoesophageal phase: Secondary | ICD-10-CM | POA: Diagnosis not present

## 2019-05-01 LAB — COMPREHENSIVE METABOLIC PANEL
AG Ratio: 1.6 (calc) (ref 1.0–2.5)
ALT: 30 U/L — ABNORMAL HIGH (ref 6–29)
AST: 25 U/L (ref 10–35)
Albumin: 4.3 g/dL (ref 3.6–5.1)
Alkaline phosphatase (APISO): 72 U/L (ref 37–153)
BUN: 13 mg/dL (ref 7–25)
CO2: 27 mmol/L (ref 20–32)
Calcium: 9.8 mg/dL (ref 8.6–10.4)
Chloride: 105 mmol/L (ref 98–110)
Creat: 0.93 mg/dL (ref 0.50–0.99)
Globulin: 2.7 g/dL (calc) (ref 1.9–3.7)
Glucose, Bld: 88 mg/dL (ref 65–139)
Potassium: 4.6 mmol/L (ref 3.5–5.3)
Sodium: 140 mmol/L (ref 135–146)
Total Bilirubin: 0.4 mg/dL (ref 0.2–1.2)
Total Protein: 7 g/dL (ref 6.1–8.1)

## 2019-05-01 LAB — CBC WITH DIFFERENTIAL/PLATELET
Absolute Monocytes: 769 cells/uL (ref 200–950)
Basophils Absolute: 38 cells/uL (ref 0–200)
Basophils Relative: 0.6 %
Eosinophils Absolute: 139 cells/uL (ref 15–500)
Eosinophils Relative: 2.2 %
HCT: 37 % (ref 35.0–45.0)
Hemoglobin: 12.5 g/dL (ref 11.7–15.5)
Lymphs Abs: 2155 cells/uL (ref 850–3900)
MCH: 32.8 pg (ref 27.0–33.0)
MCHC: 33.8 g/dL (ref 32.0–36.0)
MCV: 97.1 fL (ref 80.0–100.0)
MPV: 11.6 fL (ref 7.5–12.5)
Monocytes Relative: 12.2 %
Neutro Abs: 3200 cells/uL (ref 1500–7800)
Neutrophils Relative %: 50.8 %
Platelets: 281 10*3/uL (ref 140–400)
RBC: 3.81 10*6/uL (ref 3.80–5.10)
RDW: 12.1 % (ref 11.0–15.0)
Total Lymphocyte: 34.2 %
WBC: 6.3 10*3/uL (ref 3.8–10.8)

## 2019-05-01 LAB — CELIAC DISEASE PANEL
(tTG) Ab, IgA: 1 U/mL
(tTG) Ab, IgG: 4 U/mL
Gliadin IgA: 6 Units
Gliadin IgG: 3 Units
Immunoglobulin A: 387 mg/dL — ABNORMAL HIGH (ref 70–320)

## 2019-05-01 LAB — VITAMIN B12: Vitamin B-12: 563 pg/mL (ref 200–1100)

## 2019-05-06 ENCOUNTER — Other Ambulatory Visit (INDEPENDENT_AMBULATORY_CARE_PROVIDER_SITE_OTHER): Payer: Self-pay | Admitting: Nurse Practitioner

## 2019-05-06 DIAGNOSIS — R1310 Dysphagia, unspecified: Secondary | ICD-10-CM

## 2019-05-06 DIAGNOSIS — R1319 Other dysphagia: Secondary | ICD-10-CM

## 2019-05-08 ENCOUNTER — Encounter (INDEPENDENT_AMBULATORY_CARE_PROVIDER_SITE_OTHER): Payer: Self-pay | Admitting: *Deleted

## 2019-05-08 DIAGNOSIS — R1310 Dysphagia, unspecified: Secondary | ICD-10-CM | POA: Insufficient documentation

## 2019-05-08 DIAGNOSIS — R1319 Other dysphagia: Secondary | ICD-10-CM | POA: Insufficient documentation

## 2019-05-12 ENCOUNTER — Encounter (HOSPITAL_COMMUNITY): Payer: Self-pay

## 2019-05-12 ENCOUNTER — Ambulatory Visit (HOSPITAL_COMMUNITY)
Admission: RE | Admit: 2019-05-12 | Discharge: 2019-05-12 | Disposition: A | Discharge status: Home / Self Care | Payer: PPO | Source: Ambulatory Visit | Attending: Family Medicine | Admitting: Family Medicine

## 2019-05-12 ENCOUNTER — Other Ambulatory Visit: Payer: Self-pay

## 2019-05-12 DIAGNOSIS — Z1231 Encounter for screening mammogram for malignant neoplasm of breast: Secondary | ICD-10-CM | POA: Insufficient documentation

## 2019-05-23 ENCOUNTER — Encounter (HOSPITAL_COMMUNITY): Payer: Self-pay

## 2019-05-25 ENCOUNTER — Other Ambulatory Visit: Payer: Self-pay

## 2019-05-25 ENCOUNTER — Other Ambulatory Visit (HOSPITAL_COMMUNITY)
Admission: RE | Admit: 2019-05-25 | Discharge: 2019-05-25 | Disposition: A | Discharge status: Home / Self Care | Payer: PPO | Source: Ambulatory Visit | Attending: Internal Medicine | Admitting: Internal Medicine

## 2019-05-25 ENCOUNTER — Encounter (HOSPITAL_COMMUNITY)
Admission: RE | Admit: 2019-05-25 | Discharge: 2019-05-25 | Disposition: A | Discharge status: Home / Self Care | Payer: PPO | Source: Ambulatory Visit | Attending: Internal Medicine | Admitting: Internal Medicine

## 2019-05-25 ENCOUNTER — Encounter (HOSPITAL_COMMUNITY): Payer: Self-pay

## 2019-05-25 DIAGNOSIS — Z01812 Encounter for preprocedural laboratory examination: Secondary | ICD-10-CM | POA: Diagnosis not present

## 2019-05-25 DIAGNOSIS — Z20828 Contact with and (suspected) exposure to other viral communicable diseases: Secondary | ICD-10-CM | POA: Insufficient documentation

## 2019-05-25 LAB — SARS CORONAVIRUS 2 (TAT 6-24 HRS): SARS Coronavirus 2: NEGATIVE

## 2019-05-29 ENCOUNTER — Ambulatory Visit (HOSPITAL_COMMUNITY)
Admission: RE | Admit: 2019-05-29 | Discharge: 2019-05-29 | Disposition: A | Discharge status: Home / Self Care | Payer: PPO | Attending: Internal Medicine | Admitting: Internal Medicine

## 2019-05-29 ENCOUNTER — Other Ambulatory Visit: Payer: Self-pay

## 2019-05-29 ENCOUNTER — Encounter (HOSPITAL_COMMUNITY)
Admission: RE | Disposition: A | Discharge status: Home / Self Care | Payer: Self-pay | Source: Home / Self Care | Attending: Internal Medicine

## 2019-05-29 ENCOUNTER — Ambulatory Visit (HOSPITAL_COMMUNITY): Payer: PPO | Admitting: Anesthesiology

## 2019-05-29 ENCOUNTER — Encounter (HOSPITAL_COMMUNITY): Payer: Self-pay

## 2019-05-29 DIAGNOSIS — R1314 Dysphagia, pharyngoesophageal phase: Secondary | ICD-10-CM | POA: Insufficient documentation

## 2019-05-29 DIAGNOSIS — J449 Chronic obstructive pulmonary disease, unspecified: Secondary | ICD-10-CM | POA: Insufficient documentation

## 2019-05-29 DIAGNOSIS — R933 Abnormal findings on diagnostic imaging of other parts of digestive tract: Secondary | ICD-10-CM | POA: Insufficient documentation

## 2019-05-29 DIAGNOSIS — K589 Irritable bowel syndrome without diarrhea: Secondary | ICD-10-CM | POA: Diagnosis not present

## 2019-05-29 DIAGNOSIS — Z9889 Other specified postprocedural states: Secondary | ICD-10-CM | POA: Diagnosis not present

## 2019-05-29 DIAGNOSIS — K449 Diaphragmatic hernia without obstruction or gangrene: Secondary | ICD-10-CM | POA: Insufficient documentation

## 2019-05-29 DIAGNOSIS — Z79890 Hormone replacement therapy (postmenopausal): Secondary | ICD-10-CM | POA: Diagnosis not present

## 2019-05-29 DIAGNOSIS — Z96611 Presence of right artificial shoulder joint: Secondary | ICD-10-CM | POA: Insufficient documentation

## 2019-05-29 DIAGNOSIS — R1310 Dysphagia, unspecified: Secondary | ICD-10-CM | POA: Insufficient documentation

## 2019-05-29 DIAGNOSIS — Z87891 Personal history of nicotine dependence: Secondary | ICD-10-CM | POA: Diagnosis not present

## 2019-05-29 DIAGNOSIS — F419 Anxiety disorder, unspecified: Secondary | ICD-10-CM | POA: Diagnosis not present

## 2019-05-29 DIAGNOSIS — K219 Gastro-esophageal reflux disease without esophagitis: Secondary | ICD-10-CM | POA: Diagnosis not present

## 2019-05-29 DIAGNOSIS — G2581 Restless legs syndrome: Secondary | ICD-10-CM | POA: Diagnosis not present

## 2019-05-29 DIAGNOSIS — M159 Polyosteoarthritis, unspecified: Secondary | ICD-10-CM | POA: Insufficient documentation

## 2019-05-29 DIAGNOSIS — Z885 Allergy status to narcotic agent status: Secondary | ICD-10-CM | POA: Insufficient documentation

## 2019-05-29 DIAGNOSIS — Z79899 Other long term (current) drug therapy: Secondary | ICD-10-CM | POA: Insufficient documentation

## 2019-05-29 DIAGNOSIS — R1319 Other dysphagia: Secondary | ICD-10-CM

## 2019-05-29 HISTORY — PX: ESOPHAGEAL DILATION: SHX303

## 2019-05-29 HISTORY — PX: ESOPHAGOGASTRODUODENOSCOPY (EGD) WITH PROPOFOL: SHX5813

## 2019-05-29 SURGERY — ESOPHAGOGASTRODUODENOSCOPY (EGD) WITH PROPOFOL
Anesthesia: General

## 2019-05-29 MED ORDER — DEXAMETHASONE SODIUM PHOSPHATE 10 MG/ML IJ SOLN
INTRAMUSCULAR | Status: DC | PRN
  Administered 2019-05-29: 4 mg via INTRAVENOUS

## 2019-05-29 MED ORDER — DEXAMETHASONE SODIUM PHOSPHATE 4 MG/ML IJ SOLN
INTRAMUSCULAR | Status: AC
  Filled 2019-05-29: qty 1

## 2019-05-29 MED ORDER — PROPOFOL 10 MG/ML IV BOLUS
INTRAVENOUS | Status: DC | PRN
  Administered 2019-05-29: 30 mg via INTRAVENOUS
  Administered 2019-05-29: 70 mg via INTRAVENOUS

## 2019-05-29 MED ORDER — PROPOFOL 500 MG/50ML IV EMUL
INTRAVENOUS | Status: DC | PRN
  Administered 2019-05-29: 300 ug/kg/min via INTRAVENOUS

## 2019-05-29 MED ORDER — CHLORHEXIDINE GLUCONATE CLOTH 2 % EX PADS: 6.0000 | MEDICATED_PAD | Freq: Once | CUTANEOUS | Status: DC

## 2019-05-29 MED ORDER — ONDANSETRON HCL 4 MG/2ML IJ SOLN
INTRAMUSCULAR | Status: DC | PRN
  Administered 2019-05-29: 4 mg via INTRAVENOUS

## 2019-05-29 MED ORDER — PROMETHAZINE HCL 25 MG/ML IJ SOLN: 6.2500 mg | INTRAMUSCULAR | Status: DC | PRN

## 2019-05-29 MED ORDER — PROPOFOL 10 MG/ML IV BOLUS
INTRAVENOUS | Status: AC
  Filled 2019-05-29: qty 40

## 2019-05-29 MED ORDER — KETAMINE HCL 50 MG/5ML IJ SOSY
PREFILLED_SYRINGE | INTRAMUSCULAR | Status: AC
  Filled 2019-05-29: qty 5

## 2019-05-29 MED ORDER — LIDOCAINE HCL (CARDIAC) PF 100 MG/5ML IV SOSY
PREFILLED_SYRINGE | INTRAVENOUS | Status: DC | PRN
  Administered 2019-05-29: 50 mg via INTRAVENOUS

## 2019-05-29 MED ORDER — ONDANSETRON HCL 4 MG/2ML IJ SOLN
INTRAMUSCULAR | Status: AC
  Filled 2019-05-29: qty 2

## 2019-05-29 MED ORDER — MIDAZOLAM HCL 2 MG/2ML IJ SOLN: 0.5000 mg | Freq: Once | INTRAMUSCULAR | Status: DC | PRN

## 2019-05-29 MED ORDER — GLYCOPYRROLATE 0.2 MG/ML IJ SOLN
INTRAMUSCULAR | Status: AC
  Filled 2019-05-29: qty 1

## 2019-05-29 MED ORDER — LACTATED RINGERS IV SOLN
INTRAVENOUS | Status: DC
  Administered 2019-05-29: 09:00:00 via INTRAVENOUS

## 2019-05-29 NOTE — Anesthesia Preprocedure Evaluation (Signed)
Anesthesia Evaluation  Patient identified by MRN, date of birth, ID band Patient awake    Reviewed: Allergy & Precautions, NPO status , Patient's Chart, lab work & pertinent test results  History of Anesthesia Complications (+) PONV and history of anesthetic complications  Airway Mallampati: II  TM Distance: >3 FB Neck ROM: Full    Dental no notable dental hx. (+) Teeth Intact   Pulmonary shortness of breath and with exertion, asthma , pneumonia, resolved, former smoker,    Pulmonary exam normal breath sounds clear to auscultation       Cardiovascular Exercise Tolerance: Good negative cardio ROS Normal cardiovascular examI Rhythm:Regular Rate:Normal     Neuro/Psych Anxiety  Neuromuscular disease negative psych ROS   GI/Hepatic hiatal hernia, GERD  Medicated and Controlled,(+) Hepatitis -S/p Nissen -2018   Endo/Other  negative endocrine ROS  Renal/GU negative Renal ROS  negative genitourinary   Musculoskeletal  (+) Arthritis , Osteoarthritis,    Abdominal   Peds negative pediatric ROS (+)  Hematology negative hematology ROS (+)   Anesthesia Other Findings   Reproductive/Obstetrics negative OB ROS                             Anesthesia Physical Anesthesia Plan  ASA: II  Anesthesia Plan: General   Post-op Pain Management:    Induction: Intravenous  PONV Risk Score and Plan: 4 or greater and Propofol infusion, TIVA, Midazolam, Ondansetron, Dexamethasone and Treatment may vary due to age or medical condition  Airway Management Planned: Nasal Cannula and Simple Face Mask  Additional Equipment:   Intra-op Plan:   Post-operative Plan:   Informed Consent: I have reviewed the patients History and Physical, chart, labs and discussed the procedure including the risks, benefits and alternatives for the proposed anesthesia with the patient or authorized representative who has indicated  his/her understanding and acceptance.     Dental advisory given  Plan Discussed with: CRNA  Anesthesia Plan Comments: (Plan Full PPE use  Plan GA with GETA as needed d/w pt -WTP with same after Q&A)        Anesthesia Quick Evaluation

## 2019-05-29 NOTE — Op Note (Signed)
[facility] Patient Name: Kathryn [name] Procedure Date: 05/29/2019 8:59 AM MRN: [id number] Date of Birth: 07/01/1956 Attending MD: Hildred Laser , MD CSN: [id number] Age: 63 Admit Type: Outpatient Procedure:                Upper GI endoscopy Indications:              Esophageal dysphagia, Abnormal cine-esophagram Providers:                Hildred Laser, MD, Otis Peak B. Sharon Seller, RN, Randa Spike, Technician Referring MD:             [facility] Medicines:                Propofol per Anesthesia Complications:            No immediate complications. Estimated Blood Loss:     Estimated blood loss: none. Procedure:                Pre-Anesthesia Assessment:                           - Prior to the procedure, a History and Physical                            was performed, and patient medications and                            allergies were reviewed. The patient's tolerance of                            previous anesthesia was also reviewed. The risks                            and benefits of the procedure and the sedation                            options and risks were discussed with the patient.                            All questions were answered, and informed consent                            was obtained. Prior Anticoagulants: The patient has                            taken no previous anticoagulant or antiplatelet                            agents. ASA Grade Assessment: II - A patient with                            mild systemic disease. After reviewing the risks  and benefits, the patient was deemed in                            satisfactory condition to undergo the procedure.                           After obtaining informed consent, the endoscope was                            passed under direct vision. Throughout the                            procedure, the patient's blood pressure, pulse, and                          oxygen saturations were monitored continuously. The                            [id number]-[id number]) scope was introduced through the                            mouth, and advanced to the second part of duodenum.                            The upper GI endoscopy was accomplished without                            difficulty. The patient tolerated the procedure                            well. Scope In: 9:22:24 AM Scope Out: 9:31:33 AM Total Procedure Duration: 0 hours 9 minutes 9 seconds  Findings:      The examined esophagus was normal.      The Z-line was found 38 cm from the incisors.      A 2 cm hiatal hernia was present.      No endoscopic abnormality was evident in the esophagus to explain the       patient's complaint of dysphagia. It was decided, however, to proceed       with dilation in the distal esophagus and at the gastroesophageal       junction. A TTS dilator was passed through the scope. Dilation with an       18-19-20 mm balloon dilator was performed to 18 mm and 19 mm. The       dilation site was examined and showed no change and no bleeding, mucosal       tear or perforation.      Evidence of a Nissen fundoplication was found in the gastric fundus. The       wrap appeared loose.      The exam of the stomach was otherwise normal.      The duodenal bulb and second portion of the duodenum were normal. Impression:               - Normal esophagus.                           -  Z-line, 38 cm from the incisors.                           - 2 cm hiatal hernia.                           - No endoscopic esophageal abnormality to explain                            patient's dysphagia. Esophagus dilated.                           - A Nissen fundoplication was found. The wrap                            appears loose.                           - Normal duodenal bulb and second portion of the                            duodenum.                           - No  specimens collected. Moderate Sedation:      Per Anesthesia Care Recommendation:           - Patient has a contact number available for                            emergencies. The signs and symptoms of potential                            delayed complications were discussed with the                            patient. Return to normal activities tomorrow.                            Written discharge instructions were provided to the                            patient.                           - Resume previous diet today.                           - Continue present medications.                           - Please call office with progress report in one                            week. Procedure Code(s):        --- Professional ---  [phone], Esophagogastroduodenoscopy, flexible,                            transoral; with transendoscopic balloon dilation of                            esophagus (less than 30 mm diameter) Diagnosis Code(s):        --- Professional ---                           K44.9, Diaphragmatic hernia without obstruction or                            gangrene                           Z98.890, Other specified postprocedural states                           R13.14, Dysphagia, pharyngoesophageal phase                           R93.3, Abnormal findings on diagnostic imaging of                            other parts of digestive tract CPT copyright 2019 American Medical Association. All rights reserved. The codes documented in this report are preliminary and upon coder review may  be revised to meet current compliance requirements. Hildred Laser, MD Hildred Laser, MD 05/29/2019 9:42:19 AM This report has been signed electronically. Number of Addenda: 0

## 2019-05-29 NOTE — H&P (Signed)
Kathryn [name] is an 63 y.o. female.   Chief Complaint: Patient is here for esophagogastroduodenoscopy with esophageal dilation. HPI: Patient is 63 year old Caucasian female who has chronic GERD and underwent laparoscopic antireflux surgery with fundoplication in March [id number].  Patient was recently seen in the office with solid food dysphagia and also reported heartburn which started about 2 months ago.  She states her cardiologist put her back on pantoprazole with control of heartburn.  She denies nausea vomiting or epigastric pain.  She has good appetite.  She has gained few pounds.  She denies melena. She had barium pill esophagogram.  The study suggested mild esophageal dysmotility and barium pill held at the GE junction temporarily.  Please note that patient did have esophageal manometry in January 2018 and esophageal body peristalsis were normal.  Past Medical History:  Diagnosis Date  . Adrenal insufficiency ([place])    occured following steroid shoulder injections ~ spring 2017  . Anxiety    pt. reports that she has RLS- takes Xanax for calming her self so she can settle & sleep   . Arthritis    shoulder & back, knees & leg    . Asthma    aleery related  . Chest pain, atypical    had cardiac workup - deemed related to anxiety  . Chronic bronchitis ([place])    "less since I quit smoking" (02/21/2016)  . Chronic lower back pain   . Dyspnea    related to spray paint that is put in  beer cans at her place of employment.  Pt. also reports that she has anxiety also that leads to SOB., Pt. reports her Mother just died a week ago    . GERD (gastroesophageal reflux disease)   . Guillain-Barre syndrome following vaccination ([place]) 1970's   post swine flu shot, hosp. for treatment for 1 month   . H/O dizziness   . H/O hiatal hernia   . Hepatitis    hepatitis as a child; "got it from a little girl at school" make sick and vomiting  . Herpes simplex   . History of Guillain-Barre syndrome ~ 1978    "from the swine flu shot"  . IBS (irritable bowel syndrome)   . Pneumonia   . PONV (postoperative nausea and vomiting)   . Restless leg syndrome   . Walking pneumonia ~ 2011    Past Surgical History:  Procedure Laterality Date  . [place] STUDY N/A 09/23/2016   Procedure: [place] STUDY;  Surgeon: Mauri Pole, MD;  Location: [facility];  Service: Endoscopy;  Laterality: N/A;  . ANTERIOR CERVICAL DECOMP/DISCECTOMY FUSION  2000 & 2015   two times ago- C5 and C6, Dr. Otilio Connors, MD 2015  . APPENDECTOMY  1980s  . BACK SURGERY    . CHOLECYSTECTOMY OPEN  1980s  . COLONOSCOPY N/A 09/20/2014   Procedure: COLONOSCOPY;  Surgeon: Rogene Houston, MD;  [facility];  Service: Endoscopy;  Laterality: N/A;  200  . ESOPHAGEAL MANOMETRY N/A 09/23/2016   Procedure: ESOPHAGEAL MANOMETRY (EM);  Surgeon: Mauri Pole, MD;  Location: [facility];  Service: Endoscopy;  Laterality: N/A;  . ESOPHAGOGASTRODUODENOSCOPY N/A 09/20/2014   Procedure: ESOPHAGOGASTRODUODENOSCOPY (EGD);  Surgeon: Rogene Houston, MD;  [facility];  Service: Endoscopy;  Laterality: N/A;  . HIATAL HERNIA REPAIR N/A 11/27/2016   Procedure: LAPAROSCOPIC REPAIR OF HIATAL HERNIA WITH NISSEN FUNDOPLICATION;  Surgeon: Jackolyn Confer, MD;  Location: [facility];  Service: General;  Laterality: N/A;  . INCISION AND DRAINAGE OF WOUND Left 02/21/2016   forearm, minor  . LUMBAR LAMINECTOMY/DECOMPRESSION MICRODISCECTOMY Right 06/22/2013   Procedure: RIGHT LUMBAR TWO-THREE DISCECTOMY;  Surgeon: Otilio Connors, MD;  Location: [facility];  Service: Neurosurgery;  Laterality: Right;  Right   . MALONEY DILATION N/A 09/20/2014   Procedure: Venia Minks DILATION;  Surgeon: Rogene Houston, MD;  [facility];  Service: Endoscopy;  Laterality: N/A;  . MINOR IRRIGATION AND DEBRIDEMENT OF WOUND Left 02/21/2016   Procedure: MINOR IRRIGATION AND DEBRIDEMENT/REPAIR OF LEFT ARM WOUND;  Surgeon: Netta Cedars, MD;   Location: [facility] [place];  Service: Orthopedics;  Laterality: Left;  . REVERSE SHOULDER ARTHROPLASTY Right 02/21/2016   Procedure: RIGHT REVERSE TOTAL SHOULDER ARTHROPLASTY;  Surgeon: Netta Cedars, MD;  Location: [facility];  Service: Orthopedics;  Laterality: Right;  . REVERSE TOTAL SHOULDER ARTHROPLASTY Right 02/21/2016  . SHOULDER ARTHROSCOPY W/ ROTATOR CUFF REPAIR Right 1990s  . TOTAL SHOULDER REPLACEMENT     Right  . VAGINAL HYSTERECTOMY      Family History  Problem Relation Age of Onset  . Congestive Heart Failure Mother   . Atrial fibrillation Mother   . Hiatal hernia Mother   . Cancer Father        lung  . Heart attack Brother   . Stroke Paternal Grandfather   . Diabetes Paternal Grandfather   . Alzheimer's disease Paternal Grandmother    Social History:  reports that she quit smoking about 20 years ago. Her smoking use included cigarettes. She has a 30.00 pack-year smoking history. She has never used smokeless tobacco. She reports that she does not drink alcohol or use drugs.  Allergies:  Allergies  Allergen Reactions  . Niacin And Related Shortness Of Breath, Swelling and Other (See Comments)    Neck; skin hot  . Codeine Swelling and Other (See Comments)    tongue  . Zinc Swelling    Medications Prior to Admission  Medication Sig Dispense Refill  . acetaminophen (TYLENOL) 500 MG tablet Take 500-1,000 mg by mouth every 6 (six) hours as needed (For pain.).    Marland Kitchen albuterol (PROVENTIL HFA;VENTOLIN HFA) 108 (90 Base) MCG/ACT inhaler Inhale 2 puffs into the lungs every 4 (four) hours as needed for wheezing or shortness of breath.    . ALPRAZolam (XANAX) 0.5 MG tablet Take 0.5 mg by mouth as needed for anxiety.     . Ascorbic Acid (VITAMIN C) 1000 MG tablet Take 1,000 mg by mouth daily.     . Biotin 10000 MCG TABS Take 10,000 mcg by mouth daily.     . Calcium Carbonate-Vitamin D (CALTRATE 600+D PO) Take 1 tablet by mouth daily.    . Cholecalciferol (VITAMIN D-3) 125 MCG (5000 UT) TABS  Take 5,000 Units by mouth daily.     Marland Kitchen dicyclomine (BENTYL) 10 MG capsule Take 10 mg by mouth daily as needed (irritable bowel syndrome).     . fluticasone (FLONASE) 50 MCG/ACT nasal spray Place 2 sprays into both nostrils daily.     Marland Kitchen ibuprofen (ADVIL) 200 MG tablet Take 400-600 mg by mouth every 6 (six) hours as needed for moderate pain.    Marland Kitchen levothyroxine (SYNTHROID) 25 MCG tablet Take 25 mcg by mouth daily before breakfast.    . loratadine (CLARITIN) 10 MG tablet Take 10 mg by mouth daily.    . montelukast (SINGULAIR) 10 MG tablet Take 10 mg by mouth every morning.     . Multiple Vitamins-Minerals (CENTRUM SILVER  50+WOMEN PO) Take 1 tablet by mouth daily.     . pantoprazole (PROTONIX) 40 MG tablet Take 1 tablet (40 mg total) by mouth daily. 90 tablet 1  . traMADol (ULTRAM) 50 MG tablet Take 50-100 mg by mouth every 12 (twelve) hours as needed for severe pain.     . valACYclovir (VALTREX) 1000 MG tablet Take 1,000 mg by mouth daily.      No results found for this or any previous visit (from the past 48 hour(s)). No results found.  ROS  Blood pressure (!) 117/58, pulse 66, temperature 98 F (36.7 C), temperature source Oral, resp. rate 16, height 5\' 7"  (1.702 m), weight 70.8 kg, SpO2 98 %. Physical Exam  Constitutional: She appears well-developed and well-nourished.  HENT:  Mouth/Throat: Oropharynx is clear and moist.  Eyes: Conjunctivae are normal. No scleral icterus.  Neck: No thyromegaly present.  Cardiovascular: Normal rate, regular rhythm and normal heart sounds.  No murmur heard. Respiratory: Effort normal and breath sounds normal.  GI:  Right subcostal scar.  Abdomen is soft with mild midepigastric tenderness.  No organomegaly or masses.  Musculoskeletal:        General: No edema.  Lymphadenopathy:    She has no cervical adenopathy.  Neurological: She is alert.  Skin: Skin is warm and dry.     Assessment/Plan Esophageal dysphagia. Abnormal  esophagogram. Esophagogastroduodenoscopy with esophageal dilation.  Hildred Laser, MD 05/29/2019, 9:14 AM

## 2019-05-29 NOTE — Transfer of Care (Signed)
Immediate Anesthesia Transfer of Care Note  Patient: Kathryn [name]  Procedure(s) Performed: ESOPHAGOGASTRODUODENOSCOPY (EGD) WITH PROPOFOL (N/A ) ESOPHAGEAL DILATION (N/A )  Patient Location: PACU  Anesthesia Type:MAC  Level of Consciousness: awake, alert  and oriented  Airway & Oxygen Therapy: Patient Spontanous Breathing and Patient connected to nasal cannula oxygen  Post-op Assessment: Report given to RN, Post -op Vital signs reviewed and stable and Patient moving all extremities X 4  Post vital signs: Reviewed and stable  Last Vitals:  Vitals Value Taken Time  BP 102/54 05/29/19 0937  Temp    Pulse 78 05/29/19 0942  Resp 17 05/29/19 0942  SpO2 98 % 05/29/19 0942  Vitals shown include unvalidated device data.  Last Pain:  Vitals:   05/29/19 0920  TempSrc:   PainSc: 0-No pain      Patients Stated Pain Goal: 7 (76/19/50 9326)  Complications: No apparent anesthesia complications

## 2019-05-29 NOTE — Anesthesia Postprocedure Evaluation (Signed)
Anesthesia Post Note  Patient: Kathryn [name]  Procedure(s) Performed: ESOPHAGOGASTRODUODENOSCOPY (EGD) WITH PROPOFOL (N/A ) ESOPHAGEAL DILATION (N/A )  Patient location during evaluation: Phase II Anesthesia Type: General Level of consciousness: awake and alert and patient cooperative Pain management: satisfactory to patient Vital Signs Assessment: post-procedure vital signs reviewed and stable Respiratory status: spontaneous breathing Cardiovascular status: stable Postop Assessment: no apparent nausea or vomiting Anesthetic complications: no     Last Vitals:  Vitals:   05/29/19 1000 05/29/19 1015  BP: 123/69 133/72  Pulse: 67 74  Resp: 12 12  Temp:  (!) 36.2 C  SpO2: 100% 97%    Last Pain:  Vitals:   05/29/19 1015  TempSrc: Axillary  PainSc: 0-No pain                 Kineta Fudala

## 2019-05-29 NOTE — Discharge Instructions (Signed)
Resume usual medications and diet as before. No driving for 24 hours. Please call office with progress report in 1 week.   Upper Endoscopy, Adult, Care After This sheet gives you information about how to care for yourself after your procedure. Your health care provider may also give you more specific instructions. If you have problems or questions, contact your health care provider. What can I expect after the procedure? After the procedure, it is common to have:  A sore throat.  Mild stomach pain or discomfort.  Bloating.  Nausea. Follow these instructions at home:   Follow instructions from your health care provider about what to eat or drink after your procedure.  Return to your normal activities as told by your health care provider. Ask your health care provider what activities are safe for you.  Take over-the-counter and prescription medicines only as told by your health care provider.  Do not drive for 24 hours if you were given a sedative during your procedure.  Keep all follow-up visits as told by your health care provider. This is important. Contact a health care provider if you have:  A sore throat that lasts longer than one day.  Trouble swallowing. Get help right away if:  You vomit blood or your vomit looks like coffee grounds.  You have: ? A fever. ? Bloody, black, or tarry stools. ? A severe sore throat or you cannot swallow. ? Difficulty breathing. ? Severe pain in your chest or abdomen. Summary  After the procedure, it is common to have a sore throat, mild stomach discomfort, bloating, and nausea.  Do not drive for 24 hours if you were given a sedative during the procedure.  Follow instructions from your health care provider about what to eat or drink after your procedure.  Return to your normal activities as told by your health care provider. This information is not intended to replace advice given to you by your health care provider. Make sure  you discuss any questions you have with your health care provider. Document Released: 02/23/2012 Document Revised: 02/15/2018 Document Reviewed: 01/24/2018 Elsevier Patient Education  2020 [employer] After These instructions provide you with information about caring for yourself after your procedure. Your health care provider may also give you more specific instructions. Your treatment has been planned according to current medical practices, but problems sometimes occur. Call your health care provider if you have any problems or questions after your procedure. What can I expect after the procedure? After your procedure, you may:  Feel sleepy for several hours.  Feel clumsy and have poor balance for several hours.  Feel forgetful about what happened after the procedure.  Have poor judgment for several hours.  Feel nauseous or vomit.  Have a sore throat if you had a breathing tube during the procedure. Follow these instructions at home: For at least 24 hours after the procedure:      Have a responsible adult stay with you. It is important to have someone help care for you until you are awake and alert.  Rest as needed.  Do not: ? Participate in activities in which you could fall or become injured. ? Drive. ? Use heavy machinery. ? Drink alcohol. ? Take sleeping pills or medicines that cause drowsiness. ? Make important decisions or sign legal documents. ? Take care of children on your own. Eating and drinking  Follow the diet that is recommended by your health care provider.  If you  vomit, drink water, juice, or soup when you can drink without vomiting.  Make sure you have little or no nausea before eating solid foods. General instructions  Take over-the-counter and prescription medicines only as told by your health care provider.  If you have sleep apnea, surgery and certain medicines can increase your risk for breathing problems.  Follow instructions from your health care provider about wearing your sleep device: ? Anytime you are sleeping, including during daytime naps. ? While taking prescription pain medicines, sleeping medicines, or medicines that make you drowsy.  If you smoke, do not smoke without supervision.  Keep all follow-up visits as told by your health care provider. This is important. Contact a health care provider if:  You keep feeling nauseous or you keep vomiting.  You feel light-headed.  You develop a rash.  You have a fever. Get help right away if:  You have trouble breathing. Summary  For several hours after your procedure, you may feel sleepy and have poor judgment.  Have a responsible adult stay with you for at least 24 hours or until you are awake and alert. This information is not intended to replace advice given to you by your health care provider. Make sure you discuss any questions you have with your health care provider. Document Released: 12/15/2015 Document Revised: 11/22/2017 Document Reviewed: 12/15/2015 Elsevier Patient Education  2020 [employer].

## 2019-06-01 ENCOUNTER — Encounter (HOSPITAL_COMMUNITY): Payer: Self-pay | Admitting: Internal Medicine

## 2019-06-25 NOTE — Progress Notes (Signed)
Subjective:    Patient ID: Kathryn [name], female    DOB: Aug 27, 1956, 63 y.o.   MRN: [id number]  HPI Kathryn [name] is a 63 year old female with a past medical history significant for anxiety, arthritis, atypical chest pain, dysphagia, past esophageal dilatations, GERD, s/p Nissen Fundoplication [id number], adrenal insufficiency, restless leg syndrome and past Guillain Barre syndrome. She underwent an EGD 05/29/2019 which was normal. No explanation for her dysphagia, however the esophagus was dilated. She complains of having heartburn a few times weekly since her EGD. She ran out of Pantoprazole for 3 to 4 days, by the second day she developed heartburn. Acidic products triggers heartburn. She burps foam like secretions which occurs almost daily, not as bad as in the past. Food passes down slow at times, she burps and food or acid backs up. She underwent a barium swallow on 05/01/2019 which showed age related dysmotility. She underwent an EGD 05/29/2019 which showed a 2cm hiatal hernia, the Nissen fundoplication wrap was loose and the esophagus was normal and was dilated. She has intermittent constipation. No rectal bleeding. Her most recent colonoscopy 09/2014 showed a lymphoid polyp, recall colonoscopy in 10 years recommended.   EGD 05/29/2019: - Normal esophagus. - Z-line, 38 cm from the incisors. - 2 cm hiatal hernia. - No endoscopic esophageal abnormality to explain patient's dysphagia. Esophagus dilated. - A Nissen fundoplication was found. The wrap appears loose. - Normal duodenal bulb and second portion of the duodenum. - No specimens collected.  Esophageal Manometry 09/23/2016:   Fisher Impedence Study 09/23/2016: evidence of significant gastroesophageal reflux   EGD 09/20/2014: Esophagus: Mucosa of the esophagus was normal. GE junction was unremarkable without ring or stricture formation. GEJ: 38 cm Hiatus:40 cm Stomach: Stomach was empty and distended very well with insufflation. Folds in  the proximal stomach are normal. Examination mucosa gastric bite was normal. Mucosa at antrum was abnormal with the mortise diffuse changes of edema and erythema. No erosions or ulcers are noted . Pyloric channel was patent. Angularis fundus and cardia were normal. Duodenum: Bulbar and post bulbar mucosa was normal. Therapeutic/Diagnostic Maneuvers Performed:  Esophagus was dilated by passing 54 French Maloney dilator to full insertion. Endoscope was passed again and mucosa reexamined and no mucosal disruption noted. Antral biopsy was taken for routine histology. Endoscope was then withdrawn and patient appear for procedure #2.  COLONOSCOPY 09/20/2014: Prep was excellent except she had thick layer of stool coating cecal and ascending colon mucosa. Cecal landmarks were well identified after vigorous washing. Small lymphoid polyp ablated via cold biopsy from appendiceal stump. Mucosa of rest of the colon was normal. Normal mucosa of rectum and anorectal junction. Recall colonoscopy 10 years recommended.   Past Medical History:  Diagnosis Date  . Adrenal insufficiency ([place])    occured following steroid shoulder injections ~ spring 2017  . Anxiety    pt. reports that she has RLS- takes Xanax for calming her self so she can settle & sleep   . Arthritis    shoulder & back, knees & leg    . Asthma    aleery related  . Chest pain, atypical    had cardiac workup - deemed related to anxiety  . Chronic bronchitis ([place])    "less since I quit smoking" (02/21/2016)  . Chronic lower back pain   . Dyspnea    related to spray paint that is put in  beer cans at her place of employment.  Pt. also reports that she has  anxiety also that leads to SOB., Pt. reports her Mother just died a week ago    . GERD (gastroesophageal reflux disease)   . Guillain-Barre syndrome following vaccination ([place]) 1970's   post swine flu shot, hosp. for treatment for 1 month   . H/O dizziness   . H/O hiatal hernia    . Hepatitis    hepatitis as a child; "got it from a little girl at school" make sick and vomiting  . Herpes simplex   . History of Guillain-Barre syndrome ~ 1978   "from the swine flu shot"  . IBS (irritable bowel syndrome)   . Pneumonia   . PONV (postoperative nausea and vomiting)   . Restless leg syndrome   . Walking pneumonia ~ 2011   Past Surgical History:  Procedure Laterality Date  . [place] STUDY N/A 09/23/2016   Procedure: [place] STUDY;  Surgeon: Mauri Pole, MD;  Location: [facility];  Service: Endoscopy;  Laterality: N/A;  . ANTERIOR CERVICAL DECOMP/DISCECTOMY FUSION  2000 & 2015   two times ago- C5 and C6, Dr. Otilio Connors, MD 2015  . APPENDECTOMY  1980s  . BACK SURGERY    . CHOLECYSTECTOMY OPEN  1980s  . COLONOSCOPY N/A 09/20/2014   Procedure: COLONOSCOPY;  Surgeon: Rogene Houston, MD;  Location: [facility];  Service: Endoscopy;  Laterality: N/A;  200  . ESOPHAGEAL DILATION N/A 05/29/2019   Procedure: ESOPHAGEAL DILATION;  Surgeon: Rogene Houston, MD;  [facility];  Service: Endoscopy;  Laterality: N/A;  . ESOPHAGEAL MANOMETRY N/A 09/23/2016   Procedure: ESOPHAGEAL MANOMETRY (EM);  Surgeon: Mauri Pole, MD;  Location: [facility];  Service: Endoscopy;  Laterality: N/A;  . ESOPHAGOGASTRODUODENOSCOPY N/A 09/20/2014   Procedure: ESOPHAGOGASTRODUODENOSCOPY (EGD);  Surgeon: Rogene Houston, MD;  [facility];  Service: Endoscopy;  Laterality: N/A;  . ESOPHAGOGASTRODUODENOSCOPY (EGD) WITH PROPOFOL N/A 05/29/2019   Procedure: ESOPHAGOGASTRODUODENOSCOPY (EGD) WITH PROPOFOL;  Surgeon: Rogene Houston, MD;  [facility];  Service: Endoscopy;  Laterality: N/A;  2:30-moved up to 9:40am per office  . HIATAL HERNIA REPAIR N/A 11/27/2016   Procedure: LAPAROSCOPIC REPAIR OF HIATAL HERNIA WITH NISSEN FUNDOPLICATION;  Surgeon: Jackolyn Confer, MD;  Location: [facility];  Service: General;  Laterality: N/A;  . INCISION AND DRAINAGE OF  WOUND Left 02/21/2016   forearm, minor  . LUMBAR LAMINECTOMY/DECOMPRESSION MICRODISCECTOMY Right 06/22/2013   Procedure: RIGHT LUMBAR TWO-THREE DISCECTOMY;  Surgeon: Otilio Connors, MD;  Location: [facility];  Service: Neurosurgery;  Laterality: Right;  Right   . MALONEY DILATION N/A 09/20/2014   Procedure: Venia Minks DILATION;  Surgeon: Rogene Houston, MD;  [facility];  Service: Endoscopy;  Laterality: N/A;  . MINOR IRRIGATION AND DEBRIDEMENT OF WOUND Left 02/21/2016   Procedure: MINOR IRRIGATION AND DEBRIDEMENT/REPAIR OF LEFT ARM WOUND;  Surgeon: Netta Cedars, MD;  Location: [facility];  Service: Orthopedics;  Laterality: Left;  . REVERSE SHOULDER ARTHROPLASTY Right 02/21/2016   Procedure: RIGHT REVERSE TOTAL SHOULDER ARTHROPLASTY;  Surgeon: Netta Cedars, MD;  Location: [facility];  Service: Orthopedics;  Laterality: Right;  . REVERSE TOTAL SHOULDER ARTHROPLASTY Right 02/21/2016  . SHOULDER ARTHROSCOPY W/ ROTATOR CUFF REPAIR Right 1990s  . TOTAL SHOULDER REPLACEMENT     Right  . VAGINAL HYSTERECTOMY        Objective:   Physical Exam  BP 113/69   Pulse 69   Temp 98.2 F (36.8 C)   Ht 5\' 7"  (1.702  m)   Wt 158 lb 11.2 oz (72 kg)   BMI 24.23 kg/m   63 year old female well-developed in no acute distress Eyes: Sclera nonicteric, conjunctiva pink, Neck: Supple Heart: Regular rate and rhythm, no murmurs Lungs: Breath sounds clear throughout Abdomen: Soft, mild tenderness above the umbilicus and to the right lower quadrant and central lower abdomen without rebound or guarding, positive bowel sounds to all 4 quadrants, large right upper quadrant scar noted, no HSM Extremities: No edema Neuro: Alert and oriented x4, no focal deficits    Assessment & Plan:   5. 63 year old female with esophageal dysmotility, EGD showed a loose Nissen fundoplication wrap and a hiatal hernia -avoid eating within 3 hours of going to bed -advised weight loss -continue Pantoprazole 40mg  once daily, to  increase to bid if reflux if symptoms worsen -discussed scheduling a consult with an esophageal motility specialist  -follow up in office in 4 months  2. Constipation -Miralax 1 capful mixed in 8 ounces of water at bed time  -next colonoscopy due Jan. 2026, earlier if symptoms warrant

## 2019-06-27 ENCOUNTER — Ambulatory Visit (INDEPENDENT_AMBULATORY_CARE_PROVIDER_SITE_OTHER): Payer: PPO | Admitting: Nurse Practitioner

## 2019-06-27 ENCOUNTER — Other Ambulatory Visit: Payer: Self-pay

## 2019-06-27 ENCOUNTER — Encounter (INDEPENDENT_AMBULATORY_CARE_PROVIDER_SITE_OTHER): Payer: Self-pay | Admitting: Nurse Practitioner

## 2019-06-27 VITALS — BP 113/69 | HR 69 | Temp 98.2°F | Ht 67.0 in | Wt 158.7 lb

## 2019-06-27 DIAGNOSIS — K581 Irritable bowel syndrome with constipation: Secondary | ICD-10-CM | POA: Diagnosis not present

## 2019-06-27 DIAGNOSIS — K5900 Constipation, unspecified: Secondary | ICD-10-CM | POA: Diagnosis not present

## 2019-06-27 DIAGNOSIS — K589 Irritable bowel syndrome without diarrhea: Secondary | ICD-10-CM | POA: Insufficient documentation

## 2019-06-27 DIAGNOSIS — K224 Dyskinesia of esophagus: Secondary | ICD-10-CM | POA: Insufficient documentation

## 2019-06-27 DIAGNOSIS — R1319 Other dysphagia: Secondary | ICD-10-CM

## 2019-06-27 DIAGNOSIS — R1310 Dysphagia, unspecified: Secondary | ICD-10-CM | POA: Diagnosis not present

## 2019-06-27 MED ORDER — DICYCLOMINE HCL 10 MG PO CAPS: 10.0000 mg | ORAL_CAPSULE | Freq: Every day | ORAL | 0 refills | Status: DC | PRN

## 2019-06-27 NOTE — Patient Instructions (Signed)
1.  Continue pantoprazole 40 mg once daily, if your heartburn symptoms increase then take pantoprazole twice daily.  2.  Avoid acidic foods which trigger your heartburn symptoms.  Avoid eating large meals and do not eat within 3 hours before going to bed.  Advised losing 5 to 8 pounds.  3.  MiraLAX 1 capful mixed in 8 ounces water at bedtime.  Change her probiotic.  Our office if your constipation and abdominal cramping continues or worsens.  4.  Discussed scheduling a consult with an esophageal motility specialist if you continue to have acid or food regurgitation symptoms  5.  Follow-up in office with Dr. Dorien Chihuahua in 4 months

## 2019-07-19 ENCOUNTER — Other Ambulatory Visit (INDEPENDENT_AMBULATORY_CARE_PROVIDER_SITE_OTHER): Payer: Self-pay | Admitting: Nurse Practitioner

## 2019-07-19 DIAGNOSIS — K581 Irritable bowel syndrome with constipation: Secondary | ICD-10-CM

## 2019-10-13 ENCOUNTER — Ambulatory Visit: Payer: PPO | Attending: Internal Medicine

## 2019-10-13 ENCOUNTER — Other Ambulatory Visit: Payer: Self-pay

## 2019-10-13 DIAGNOSIS — Z20822 Contact with and (suspected) exposure to covid-19: Secondary | ICD-10-CM | POA: Diagnosis not present

## 2019-10-13 DIAGNOSIS — J069 Acute upper respiratory infection, unspecified: Secondary | ICD-10-CM | POA: Diagnosis not present

## 2019-10-15 ENCOUNTER — Telehealth: Payer: Self-pay

## 2019-10-15 LAB — NOVEL CORONAVIRUS, NAA: SARS-CoV-2, NAA: NOT DETECTED

## 2019-10-15 NOTE — Telephone Encounter (Signed)

## 2019-11-07 DIAGNOSIS — E7849 Other hyperlipidemia: Secondary | ICD-10-CM | POA: Diagnosis not present

## 2019-11-07 DIAGNOSIS — Z0000 Encounter for general adult medical examination without abnormal findings: Secondary | ICD-10-CM | POA: Diagnosis not present

## 2019-11-07 DIAGNOSIS — Z6825 Body mass index (BMI) 25.0-25.9, adult: Secondary | ICD-10-CM | POA: Diagnosis not present

## 2019-11-07 DIAGNOSIS — R7309 Other abnormal glucose: Secondary | ICD-10-CM | POA: Diagnosis not present

## 2019-11-07 DIAGNOSIS — K589 Irritable bowel syndrome without diarrhea: Secondary | ICD-10-CM | POA: Diagnosis not present

## 2019-11-07 DIAGNOSIS — Z1389 Encounter for screening for other disorder: Secondary | ICD-10-CM | POA: Diagnosis not present

## 2019-11-07 DIAGNOSIS — J302 Other seasonal allergic rhinitis: Secondary | ICD-10-CM | POA: Diagnosis not present

## 2019-11-21 DIAGNOSIS — H1045 Other chronic allergic conjunctivitis: Secondary | ICD-10-CM | POA: Diagnosis not present

## 2019-11-21 DIAGNOSIS — J301 Allergic rhinitis due to pollen: Secondary | ICD-10-CM | POA: Diagnosis not present

## 2019-11-21 DIAGNOSIS — J3089 Other allergic rhinitis: Secondary | ICD-10-CM | POA: Diagnosis not present

## 2019-11-21 DIAGNOSIS — J4530 Mild persistent asthma, uncomplicated: Secondary | ICD-10-CM | POA: Diagnosis not present

## 2019-11-23 DIAGNOSIS — M25561 Pain in right knee: Secondary | ICD-10-CM | POA: Insufficient documentation

## 2019-11-28 DIAGNOSIS — M25561 Pain in right knee: Secondary | ICD-10-CM | POA: Diagnosis not present

## 2019-11-30 DIAGNOSIS — J3089 Other allergic rhinitis: Secondary | ICD-10-CM | POA: Diagnosis not present

## 2019-11-30 DIAGNOSIS — J301 Allergic rhinitis due to pollen: Secondary | ICD-10-CM | POA: Diagnosis not present

## 2019-12-05 DIAGNOSIS — J3089 Other allergic rhinitis: Secondary | ICD-10-CM | POA: Diagnosis not present

## 2019-12-05 DIAGNOSIS — J301 Allergic rhinitis due to pollen: Secondary | ICD-10-CM | POA: Diagnosis not present

## 2019-12-12 DIAGNOSIS — J3089 Other allergic rhinitis: Secondary | ICD-10-CM | POA: Diagnosis not present

## 2019-12-12 DIAGNOSIS — J301 Allergic rhinitis due to pollen: Secondary | ICD-10-CM | POA: Diagnosis not present

## 2019-12-19 DIAGNOSIS — J301 Allergic rhinitis due to pollen: Secondary | ICD-10-CM | POA: Diagnosis not present

## 2019-12-19 DIAGNOSIS — J3089 Other allergic rhinitis: Secondary | ICD-10-CM | POA: Diagnosis not present

## 2019-12-26 DIAGNOSIS — J3089 Other allergic rhinitis: Secondary | ICD-10-CM | POA: Diagnosis not present

## 2019-12-26 DIAGNOSIS — J301 Allergic rhinitis due to pollen: Secondary | ICD-10-CM | POA: Diagnosis not present

## 2019-12-26 DIAGNOSIS — K7689 Other specified diseases of liver: Secondary | ICD-10-CM | POA: Diagnosis not present

## 2020-01-02 DIAGNOSIS — J301 Allergic rhinitis due to pollen: Secondary | ICD-10-CM | POA: Diagnosis not present

## 2020-01-02 DIAGNOSIS — J3089 Other allergic rhinitis: Secondary | ICD-10-CM | POA: Diagnosis not present

## 2020-01-09 DIAGNOSIS — J301 Allergic rhinitis due to pollen: Secondary | ICD-10-CM | POA: Diagnosis not present

## 2020-01-09 DIAGNOSIS — J3089 Other allergic rhinitis: Secondary | ICD-10-CM | POA: Diagnosis not present

## 2020-01-16 DIAGNOSIS — J3089 Other allergic rhinitis: Secondary | ICD-10-CM | POA: Diagnosis not present

## 2020-01-16 DIAGNOSIS — J301 Allergic rhinitis due to pollen: Secondary | ICD-10-CM | POA: Diagnosis not present

## 2020-01-23 DIAGNOSIS — J3089 Other allergic rhinitis: Secondary | ICD-10-CM | POA: Diagnosis not present

## 2020-01-23 DIAGNOSIS — J301 Allergic rhinitis due to pollen: Secondary | ICD-10-CM | POA: Diagnosis not present

## 2020-01-30 DIAGNOSIS — J3089 Other allergic rhinitis: Secondary | ICD-10-CM | POA: Diagnosis not present

## 2020-01-30 DIAGNOSIS — J301 Allergic rhinitis due to pollen: Secondary | ICD-10-CM | POA: Diagnosis not present

## 2020-02-05 DIAGNOSIS — E039 Hypothyroidism, unspecified: Secondary | ICD-10-CM | POA: Diagnosis not present

## 2020-02-05 DIAGNOSIS — K219 Gastro-esophageal reflux disease without esophagitis: Secondary | ICD-10-CM | POA: Diagnosis not present

## 2020-02-05 DIAGNOSIS — E559 Vitamin D deficiency, unspecified: Secondary | ICD-10-CM | POA: Diagnosis not present

## 2020-02-05 DIAGNOSIS — K589 Irritable bowel syndrome without diarrhea: Secondary | ICD-10-CM | POA: Diagnosis not present

## 2020-02-06 DIAGNOSIS — J301 Allergic rhinitis due to pollen: Secondary | ICD-10-CM | POA: Diagnosis not present

## 2020-02-06 DIAGNOSIS — J3089 Other allergic rhinitis: Secondary | ICD-10-CM | POA: Diagnosis not present

## 2020-02-12 DIAGNOSIS — E2749 Other adrenocortical insufficiency: Secondary | ICD-10-CM | POA: Diagnosis not present

## 2020-02-13 DIAGNOSIS — J301 Allergic rhinitis due to pollen: Secondary | ICD-10-CM | POA: Diagnosis not present

## 2020-02-13 DIAGNOSIS — J3089 Other allergic rhinitis: Secondary | ICD-10-CM | POA: Diagnosis not present

## 2020-02-26 DIAGNOSIS — E2749 Other adrenocortical insufficiency: Secondary | ICD-10-CM | POA: Diagnosis not present

## 2020-02-26 DIAGNOSIS — E039 Hypothyroidism, unspecified: Secondary | ICD-10-CM | POA: Diagnosis not present

## 2020-02-27 DIAGNOSIS — J301 Allergic rhinitis due to pollen: Secondary | ICD-10-CM | POA: Diagnosis not present

## 2020-02-27 DIAGNOSIS — J3089 Other allergic rhinitis: Secondary | ICD-10-CM | POA: Diagnosis not present

## 2020-03-05 DIAGNOSIS — J301 Allergic rhinitis due to pollen: Secondary | ICD-10-CM | POA: Diagnosis not present

## 2020-03-05 DIAGNOSIS — J3089 Other allergic rhinitis: Secondary | ICD-10-CM | POA: Diagnosis not present

## 2020-03-12 DIAGNOSIS — J301 Allergic rhinitis due to pollen: Secondary | ICD-10-CM | POA: Diagnosis not present

## 2020-03-12 DIAGNOSIS — J3089 Other allergic rhinitis: Secondary | ICD-10-CM | POA: Diagnosis not present

## 2020-03-19 DIAGNOSIS — J301 Allergic rhinitis due to pollen: Secondary | ICD-10-CM | POA: Diagnosis not present

## 2020-03-19 DIAGNOSIS — J3089 Other allergic rhinitis: Secondary | ICD-10-CM | POA: Diagnosis not present

## 2020-03-26 DIAGNOSIS — J3089 Other allergic rhinitis: Secondary | ICD-10-CM | POA: Diagnosis not present

## 2020-03-26 DIAGNOSIS — J301 Allergic rhinitis due to pollen: Secondary | ICD-10-CM | POA: Diagnosis not present

## 2020-04-02 DIAGNOSIS — J3089 Other allergic rhinitis: Secondary | ICD-10-CM | POA: Diagnosis not present

## 2020-04-02 DIAGNOSIS — J301 Allergic rhinitis due to pollen: Secondary | ICD-10-CM | POA: Diagnosis not present

## 2020-04-09 DIAGNOSIS — J301 Allergic rhinitis due to pollen: Secondary | ICD-10-CM | POA: Diagnosis not present

## 2020-04-09 DIAGNOSIS — J3089 Other allergic rhinitis: Secondary | ICD-10-CM | POA: Diagnosis not present

## 2020-04-15 ENCOUNTER — Telehealth (INDEPENDENT_AMBULATORY_CARE_PROVIDER_SITE_OTHER): Payer: Self-pay | Admitting: Gastroenterology

## 2020-04-15 MED ORDER — PANTOPRAZOLE SODIUM 40 MG PO TBEC: 40.0000 mg | DELAYED_RELEASE_TABLET | Freq: Every day | ORAL | 1 refills | Status: DC

## 2020-04-15 NOTE — Telephone Encounter (Signed)
Refill sent to pharmacy per request  

## 2020-04-16 DIAGNOSIS — J301 Allergic rhinitis due to pollen: Secondary | ICD-10-CM | POA: Diagnosis not present

## 2020-04-16 DIAGNOSIS — J3089 Other allergic rhinitis: Secondary | ICD-10-CM | POA: Diagnosis not present

## 2020-04-23 DIAGNOSIS — J301 Allergic rhinitis due to pollen: Secondary | ICD-10-CM | POA: Diagnosis not present

## 2020-04-23 DIAGNOSIS — J3089 Other allergic rhinitis: Secondary | ICD-10-CM | POA: Diagnosis not present

## 2020-04-30 DIAGNOSIS — J301 Allergic rhinitis due to pollen: Secondary | ICD-10-CM | POA: Diagnosis not present

## 2020-04-30 DIAGNOSIS — J3089 Other allergic rhinitis: Secondary | ICD-10-CM | POA: Diagnosis not present

## 2020-05-07 DIAGNOSIS — J301 Allergic rhinitis due to pollen: Secondary | ICD-10-CM | POA: Diagnosis not present

## 2020-05-07 DIAGNOSIS — J3089 Other allergic rhinitis: Secondary | ICD-10-CM | POA: Diagnosis not present

## 2020-05-14 DIAGNOSIS — J301 Allergic rhinitis due to pollen: Secondary | ICD-10-CM | POA: Diagnosis not present

## 2020-05-14 DIAGNOSIS — J3089 Other allergic rhinitis: Secondary | ICD-10-CM | POA: Diagnosis not present

## 2020-05-21 DIAGNOSIS — J301 Allergic rhinitis due to pollen: Secondary | ICD-10-CM | POA: Diagnosis not present

## 2020-05-21 DIAGNOSIS — J3089 Other allergic rhinitis: Secondary | ICD-10-CM | POA: Diagnosis not present

## 2020-05-28 DIAGNOSIS — J3089 Other allergic rhinitis: Secondary | ICD-10-CM | POA: Diagnosis not present

## 2020-05-28 DIAGNOSIS — J301 Allergic rhinitis due to pollen: Secondary | ICD-10-CM | POA: Diagnosis not present

## 2020-06-03 DIAGNOSIS — J301 Allergic rhinitis due to pollen: Secondary | ICD-10-CM | POA: Diagnosis not present

## 2020-06-03 DIAGNOSIS — J3089 Other allergic rhinitis: Secondary | ICD-10-CM | POA: Diagnosis not present

## 2020-06-04 DIAGNOSIS — J3089 Other allergic rhinitis: Secondary | ICD-10-CM | POA: Diagnosis not present

## 2020-06-04 DIAGNOSIS — J301 Allergic rhinitis due to pollen: Secondary | ICD-10-CM | POA: Diagnosis not present

## 2020-06-06 DIAGNOSIS — E039 Hypothyroidism, unspecified: Secondary | ICD-10-CM | POA: Diagnosis not present

## 2020-06-06 DIAGNOSIS — K219 Gastro-esophageal reflux disease without esophagitis: Secondary | ICD-10-CM | POA: Diagnosis not present

## 2020-06-06 DIAGNOSIS — E7849 Other hyperlipidemia: Secondary | ICD-10-CM | POA: Diagnosis not present

## 2020-06-11 DIAGNOSIS — J301 Allergic rhinitis due to pollen: Secondary | ICD-10-CM | POA: Diagnosis not present

## 2020-06-11 DIAGNOSIS — J3089 Other allergic rhinitis: Secondary | ICD-10-CM | POA: Diagnosis not present

## 2020-06-18 DIAGNOSIS — J3089 Other allergic rhinitis: Secondary | ICD-10-CM | POA: Diagnosis not present

## 2020-06-18 DIAGNOSIS — J301 Allergic rhinitis due to pollen: Secondary | ICD-10-CM | POA: Diagnosis not present

## 2020-06-18 DIAGNOSIS — H1045 Other chronic allergic conjunctivitis: Secondary | ICD-10-CM | POA: Diagnosis not present

## 2020-06-18 DIAGNOSIS — J4530 Mild persistent asthma, uncomplicated: Secondary | ICD-10-CM | POA: Diagnosis not present

## 2020-06-25 DIAGNOSIS — J301 Allergic rhinitis due to pollen: Secondary | ICD-10-CM | POA: Diagnosis not present

## 2020-06-25 DIAGNOSIS — J3089 Other allergic rhinitis: Secondary | ICD-10-CM | POA: Diagnosis not present

## 2020-07-02 DIAGNOSIS — J301 Allergic rhinitis due to pollen: Secondary | ICD-10-CM | POA: Diagnosis not present

## 2020-07-02 DIAGNOSIS — J3089 Other allergic rhinitis: Secondary | ICD-10-CM | POA: Diagnosis not present

## 2020-07-06 DIAGNOSIS — K219 Gastro-esophageal reflux disease without esophagitis: Secondary | ICD-10-CM | POA: Diagnosis not present

## 2020-07-06 DIAGNOSIS — E039 Hypothyroidism, unspecified: Secondary | ICD-10-CM | POA: Diagnosis not present

## 2020-07-06 DIAGNOSIS — E7849 Other hyperlipidemia: Secondary | ICD-10-CM | POA: Diagnosis not present

## 2020-07-09 DIAGNOSIS — J3089 Other allergic rhinitis: Secondary | ICD-10-CM | POA: Diagnosis not present

## 2020-07-09 DIAGNOSIS — J301 Allergic rhinitis due to pollen: Secondary | ICD-10-CM | POA: Diagnosis not present

## 2020-07-16 DIAGNOSIS — J3089 Other allergic rhinitis: Secondary | ICD-10-CM | POA: Diagnosis not present

## 2020-07-16 DIAGNOSIS — J301 Allergic rhinitis due to pollen: Secondary | ICD-10-CM | POA: Diagnosis not present

## 2020-07-23 DIAGNOSIS — J3089 Other allergic rhinitis: Secondary | ICD-10-CM | POA: Diagnosis not present

## 2020-07-23 DIAGNOSIS — J301 Allergic rhinitis due to pollen: Secondary | ICD-10-CM | POA: Diagnosis not present

## 2020-07-30 DIAGNOSIS — J3089 Other allergic rhinitis: Secondary | ICD-10-CM | POA: Diagnosis not present

## 2020-07-30 DIAGNOSIS — J301 Allergic rhinitis due to pollen: Secondary | ICD-10-CM | POA: Diagnosis not present

## 2020-08-06 DIAGNOSIS — J301 Allergic rhinitis due to pollen: Secondary | ICD-10-CM | POA: Diagnosis not present

## 2020-08-06 DIAGNOSIS — E7849 Other hyperlipidemia: Secondary | ICD-10-CM | POA: Diagnosis not present

## 2020-08-06 DIAGNOSIS — K219 Gastro-esophageal reflux disease without esophagitis: Secondary | ICD-10-CM | POA: Diagnosis not present

## 2020-08-06 DIAGNOSIS — J3089 Other allergic rhinitis: Secondary | ICD-10-CM | POA: Diagnosis not present

## 2020-08-06 DIAGNOSIS — E039 Hypothyroidism, unspecified: Secondary | ICD-10-CM | POA: Diagnosis not present

## 2020-08-13 DIAGNOSIS — J3089 Other allergic rhinitis: Secondary | ICD-10-CM | POA: Diagnosis not present

## 2020-08-13 DIAGNOSIS — J301 Allergic rhinitis due to pollen: Secondary | ICD-10-CM | POA: Diagnosis not present

## 2020-08-20 DIAGNOSIS — E559 Vitamin D deficiency, unspecified: Secondary | ICD-10-CM | POA: Diagnosis not present

## 2020-08-20 DIAGNOSIS — R0789 Other chest pain: Secondary | ICD-10-CM | POA: Diagnosis not present

## 2020-08-20 DIAGNOSIS — N644 Mastodynia: Secondary | ICD-10-CM | POA: Diagnosis not present

## 2020-08-20 DIAGNOSIS — E271 Primary adrenocortical insufficiency: Secondary | ICD-10-CM | POA: Diagnosis not present

## 2020-08-20 DIAGNOSIS — Z6823 Body mass index (BMI) 23.0-23.9, adult: Secondary | ICD-10-CM | POA: Diagnosis not present

## 2020-08-20 DIAGNOSIS — J3089 Other allergic rhinitis: Secondary | ICD-10-CM | POA: Diagnosis not present

## 2020-08-20 DIAGNOSIS — R079 Chest pain, unspecified: Secondary | ICD-10-CM | POA: Diagnosis not present

## 2020-08-20 DIAGNOSIS — E039 Hypothyroidism, unspecified: Secondary | ICD-10-CM | POA: Diagnosis not present

## 2020-08-20 DIAGNOSIS — J301 Allergic rhinitis due to pollen: Secondary | ICD-10-CM | POA: Diagnosis not present

## 2020-08-20 DIAGNOSIS — R7309 Other abnormal glucose: Secondary | ICD-10-CM | POA: Diagnosis not present

## 2020-08-27 DIAGNOSIS — E271 Primary adrenocortical insufficiency: Secondary | ICD-10-CM | POA: Diagnosis not present

## 2020-08-27 DIAGNOSIS — J301 Allergic rhinitis due to pollen: Secondary | ICD-10-CM | POA: Diagnosis not present

## 2020-08-27 DIAGNOSIS — J3089 Other allergic rhinitis: Secondary | ICD-10-CM | POA: Diagnosis not present

## 2020-08-27 DIAGNOSIS — N644 Mastodynia: Secondary | ICD-10-CM | POA: Diagnosis not present

## 2020-08-27 DIAGNOSIS — R079 Chest pain, unspecified: Secondary | ICD-10-CM | POA: Diagnosis not present

## 2020-08-27 DIAGNOSIS — R0789 Other chest pain: Secondary | ICD-10-CM | POA: Diagnosis not present

## 2020-08-28 ENCOUNTER — Other Ambulatory Visit (HOSPITAL_COMMUNITY): Payer: Self-pay | Admitting: Family Medicine

## 2020-08-28 DIAGNOSIS — N644 Mastodynia: Secondary | ICD-10-CM

## 2020-09-03 DIAGNOSIS — J301 Allergic rhinitis due to pollen: Secondary | ICD-10-CM | POA: Diagnosis not present

## 2020-09-03 DIAGNOSIS — J3089 Other allergic rhinitis: Secondary | ICD-10-CM | POA: Diagnosis not present

## 2020-09-05 ENCOUNTER — Ambulatory Visit (HOSPITAL_COMMUNITY)
Admission: RE | Admit: 2020-09-05 | Discharge: 2020-09-05 | Disposition: A | Discharge status: Home / Self Care | Payer: PPO | Source: Ambulatory Visit | Attending: Family Medicine | Admitting: Family Medicine

## 2020-09-05 ENCOUNTER — Other Ambulatory Visit: Payer: Self-pay

## 2020-09-05 DIAGNOSIS — N644 Mastodynia: Secondary | ICD-10-CM

## 2020-09-05 DIAGNOSIS — R922 Inconclusive mammogram: Secondary | ICD-10-CM | POA: Diagnosis not present

## 2020-09-06 DIAGNOSIS — E039 Hypothyroidism, unspecified: Secondary | ICD-10-CM | POA: Diagnosis not present

## 2020-09-06 DIAGNOSIS — K219 Gastro-esophageal reflux disease without esophagitis: Secondary | ICD-10-CM | POA: Diagnosis not present

## 2020-09-06 DIAGNOSIS — E7849 Other hyperlipidemia: Secondary | ICD-10-CM | POA: Diagnosis not present

## 2020-09-13 ENCOUNTER — Other Ambulatory Visit: Payer: PPO

## 2020-09-13 DIAGNOSIS — Z20822 Contact with and (suspected) exposure to covid-19: Secondary | ICD-10-CM

## 2020-09-17 DIAGNOSIS — U071 COVID-19: Secondary | ICD-10-CM | POA: Diagnosis not present

## 2020-09-17 LAB — NOVEL CORONAVIRUS, NAA: SARS-CoV-2, NAA: DETECTED — AB

## 2020-09-24 DIAGNOSIS — J301 Allergic rhinitis due to pollen: Secondary | ICD-10-CM | POA: Diagnosis not present

## 2020-09-24 DIAGNOSIS — J3089 Other allergic rhinitis: Secondary | ICD-10-CM | POA: Diagnosis not present

## 2020-10-01 DIAGNOSIS — J301 Allergic rhinitis due to pollen: Secondary | ICD-10-CM | POA: Diagnosis not present

## 2020-10-01 DIAGNOSIS — J3089 Other allergic rhinitis: Secondary | ICD-10-CM | POA: Diagnosis not present

## 2020-10-05 DIAGNOSIS — K219 Gastro-esophageal reflux disease without esophagitis: Secondary | ICD-10-CM | POA: Diagnosis not present

## 2020-10-05 DIAGNOSIS — E7849 Other hyperlipidemia: Secondary | ICD-10-CM | POA: Diagnosis not present

## 2020-10-05 DIAGNOSIS — E039 Hypothyroidism, unspecified: Secondary | ICD-10-CM | POA: Diagnosis not present

## 2020-10-08 DIAGNOSIS — H5203 Hypermetropia, bilateral: Secondary | ICD-10-CM | POA: Diagnosis not present

## 2020-10-09 DIAGNOSIS — J3089 Other allergic rhinitis: Secondary | ICD-10-CM | POA: Diagnosis not present

## 2020-10-09 DIAGNOSIS — J301 Allergic rhinitis due to pollen: Secondary | ICD-10-CM | POA: Diagnosis not present

## 2020-10-15 DIAGNOSIS — J301 Allergic rhinitis due to pollen: Secondary | ICD-10-CM | POA: Diagnosis not present

## 2020-10-15 DIAGNOSIS — J3089 Other allergic rhinitis: Secondary | ICD-10-CM | POA: Diagnosis not present

## 2020-10-22 DIAGNOSIS — J3089 Other allergic rhinitis: Secondary | ICD-10-CM | POA: Diagnosis not present

## 2020-10-22 DIAGNOSIS — J301 Allergic rhinitis due to pollen: Secondary | ICD-10-CM | POA: Diagnosis not present

## 2020-10-29 DIAGNOSIS — J301 Allergic rhinitis due to pollen: Secondary | ICD-10-CM | POA: Diagnosis not present

## 2020-10-29 DIAGNOSIS — J3089 Other allergic rhinitis: Secondary | ICD-10-CM | POA: Diagnosis not present

## 2020-11-05 DIAGNOSIS — J301 Allergic rhinitis due to pollen: Secondary | ICD-10-CM | POA: Diagnosis not present

## 2020-11-05 DIAGNOSIS — J3089 Other allergic rhinitis: Secondary | ICD-10-CM | POA: Diagnosis not present

## 2020-11-12 ENCOUNTER — Encounter: Payer: Self-pay | Admitting: Dermatology

## 2020-11-12 ENCOUNTER — Other Ambulatory Visit: Payer: Self-pay

## 2020-11-12 ENCOUNTER — Ambulatory Visit: Payer: Medicare Other | Admitting: Dermatology

## 2020-11-12 DIAGNOSIS — J3089 Other allergic rhinitis: Secondary | ICD-10-CM | POA: Diagnosis not present

## 2020-11-12 DIAGNOSIS — L570 Actinic keratosis: Secondary | ICD-10-CM

## 2020-11-12 DIAGNOSIS — D485 Neoplasm of uncertain behavior of skin: Secondary | ICD-10-CM

## 2020-11-12 DIAGNOSIS — J301 Allergic rhinitis due to pollen: Secondary | ICD-10-CM | POA: Diagnosis not present

## 2020-11-12 DIAGNOSIS — Z1283 Encounter for screening for malignant neoplasm of skin: Secondary | ICD-10-CM

## 2020-11-12 DIAGNOSIS — D225 Melanocytic nevi of trunk: Secondary | ICD-10-CM

## 2020-11-12 DIAGNOSIS — D229 Melanocytic nevi, unspecified: Secondary | ICD-10-CM

## 2020-11-12 HISTORY — DX: Melanocytic nevi, unspecified: D22.9

## 2020-11-12 NOTE — Patient Instructions (Signed)

## 2020-11-19 DIAGNOSIS — J3089 Other allergic rhinitis: Secondary | ICD-10-CM | POA: Diagnosis not present

## 2020-11-19 DIAGNOSIS — J301 Allergic rhinitis due to pollen: Secondary | ICD-10-CM | POA: Diagnosis not present

## 2020-11-20 ENCOUNTER — Encounter: Payer: Self-pay | Admitting: *Deleted

## 2020-11-20 ENCOUNTER — Telehealth: Payer: Self-pay | Admitting: *Deleted

## 2020-11-20 NOTE — Telephone Encounter (Signed)
Pathology to patient- 30 minute wider shave appointment scheduled.

## 2020-11-20 NOTE — Telephone Encounter (Signed)
-----   Message from Lavonna Monarch, MD sent at 11/19/2020 10:11 PM EDT ----- Routine follow-up for wider shave excision.

## 2020-11-26 DIAGNOSIS — J301 Allergic rhinitis due to pollen: Secondary | ICD-10-CM | POA: Diagnosis not present

## 2020-11-26 DIAGNOSIS — J3089 Other allergic rhinitis: Secondary | ICD-10-CM | POA: Diagnosis not present

## 2020-11-30 DIAGNOSIS — J301 Allergic rhinitis due to pollen: Secondary | ICD-10-CM | POA: Diagnosis not present

## 2020-11-30 DIAGNOSIS — J3089 Other allergic rhinitis: Secondary | ICD-10-CM | POA: Diagnosis not present

## 2020-12-01 ENCOUNTER — Encounter: Payer: Self-pay | Admitting: Dermatology

## 2020-12-01 NOTE — Progress Notes (Signed)
   Follow-Up Visit   Subjective  Kathryn [name] is a 65 y.o. female who presents for the following: Skin Problem (Spot on your right side upper lip x year, dry and scaly, gets irritated and itches, no bleeding, has grown. //).  Annual skin examination. Location:  Duration:  Quality:  Associated Signs/Symptoms: Modifying Factors:  Severity:  Timing: Context: History of atypical moles  Objective  Well appearing patient in no apparent distress; mood and affect are within normal limits. Objective  Right Upper Vermilion Lip: RIGHT UPPER LIP: Subtle 4 mm pink scale  Objective  Mid Back: By chromic 8 mm dark brown macule with dermoscopic atypia     Objective  Mid Back: Waist up skin examination, areas beneath undergarments not examined.  No atypical lesions except for the 1 biopsied on left upper back.    All sun exposed areas plus back examined.   Assessment & Plan    AK (actinic keratosis) Right Upper Vermilion Lip  Patient will judge response with fingertips in 1 month, return for biopsy if freezing fails.  Destruction of lesion - Right Upper Vermilion Lip Complexity: simple   Destruction method: cryotherapy   Informed consent: discussed and consent obtained   Timeout:  patient name, date of birth, surgical site, and procedure verified Lesion destroyed using liquid nitrogen: Yes   Cryotherapy cycles:  5 Outcome: patient tolerated procedure well with no complications   Post-procedure details: wound care instructions given    Neoplasm of uncertain behavior of skin Mid Back  Skin / nail biopsy Type of biopsy: tangential   Informed consent: discussed and consent obtained   Timeout: patient name, date of birth, surgical site, and procedure verified   Procedure prep:  Patient was prepped and draped in usual sterile fashion (Non sterile) Prep type:  Chlorhexidine Anesthesia: the lesion was anesthetized in a standard fashion   Anesthetic:  1% lidocaine w/  epinephrine 1-100,000 local infiltration Instrument used: flexible razor blade   Outcome: patient tolerated procedure well   Post-procedure details: wound care instructions given    Specimen 1 - Surgical pathology Differential Diagnosis: ATYPICAL  Check Margins: No  Encounter for screening for malignant neoplasm of skin Mid Back  Annual skin examination      I, Lavonna Monarch, MD, have reviewed all documentation for this visit.  The documentation on 12/01/20 for the exam, diagnosis, procedures, and orders are all accurate and complete.

## 2020-12-03 DIAGNOSIS — Z0000 Encounter for general adult medical examination without abnormal findings: Secondary | ICD-10-CM | POA: Diagnosis not present

## 2020-12-03 DIAGNOSIS — R7309 Other abnormal glucose: Secondary | ICD-10-CM | POA: Diagnosis not present

## 2020-12-03 DIAGNOSIS — J3089 Other allergic rhinitis: Secondary | ICD-10-CM | POA: Diagnosis not present

## 2020-12-03 DIAGNOSIS — Z1389 Encounter for screening for other disorder: Secondary | ICD-10-CM | POA: Diagnosis not present

## 2020-12-03 DIAGNOSIS — E039 Hypothyroidism, unspecified: Secondary | ICD-10-CM | POA: Diagnosis not present

## 2020-12-03 DIAGNOSIS — J302 Other seasonal allergic rhinitis: Secondary | ICD-10-CM | POA: Diagnosis not present

## 2020-12-03 DIAGNOSIS — K589 Irritable bowel syndrome without diarrhea: Secondary | ICD-10-CM | POA: Diagnosis not present

## 2020-12-03 DIAGNOSIS — J301 Allergic rhinitis due to pollen: Secondary | ICD-10-CM | POA: Diagnosis not present

## 2020-12-04 DIAGNOSIS — K219 Gastro-esophageal reflux disease without esophagitis: Secondary | ICD-10-CM | POA: Diagnosis not present

## 2020-12-04 DIAGNOSIS — E039 Hypothyroidism, unspecified: Secondary | ICD-10-CM | POA: Diagnosis not present

## 2020-12-04 DIAGNOSIS — E7849 Other hyperlipidemia: Secondary | ICD-10-CM | POA: Diagnosis not present

## 2020-12-10 DIAGNOSIS — J3089 Other allergic rhinitis: Secondary | ICD-10-CM | POA: Diagnosis not present

## 2020-12-10 DIAGNOSIS — J301 Allergic rhinitis due to pollen: Secondary | ICD-10-CM | POA: Diagnosis not present

## 2020-12-17 DIAGNOSIS — J3089 Other allergic rhinitis: Secondary | ICD-10-CM | POA: Diagnosis not present

## 2020-12-17 DIAGNOSIS — J301 Allergic rhinitis due to pollen: Secondary | ICD-10-CM | POA: Diagnosis not present

## 2020-12-24 DIAGNOSIS — J301 Allergic rhinitis due to pollen: Secondary | ICD-10-CM | POA: Diagnosis not present

## 2020-12-24 DIAGNOSIS — J3089 Other allergic rhinitis: Secondary | ICD-10-CM | POA: Diagnosis not present

## 2020-12-24 DIAGNOSIS — Z87891 Personal history of nicotine dependence: Secondary | ICD-10-CM | POA: Diagnosis not present

## 2020-12-24 DIAGNOSIS — J439 Emphysema, unspecified: Secondary | ICD-10-CM | POA: Diagnosis not present

## 2020-12-31 DIAGNOSIS — J301 Allergic rhinitis due to pollen: Secondary | ICD-10-CM | POA: Diagnosis not present

## 2020-12-31 DIAGNOSIS — J3089 Other allergic rhinitis: Secondary | ICD-10-CM | POA: Diagnosis not present

## 2021-01-07 DIAGNOSIS — J3089 Other allergic rhinitis: Secondary | ICD-10-CM | POA: Diagnosis not present

## 2021-01-07 DIAGNOSIS — J301 Allergic rhinitis due to pollen: Secondary | ICD-10-CM | POA: Diagnosis not present

## 2021-01-09 ENCOUNTER — Ambulatory Visit (INDEPENDENT_AMBULATORY_CARE_PROVIDER_SITE_OTHER): Payer: PPO | Admitting: Gastroenterology

## 2021-01-10 DIAGNOSIS — H8110 Benign paroxysmal vertigo, unspecified ear: Secondary | ICD-10-CM | POA: Diagnosis not present

## 2021-01-10 DIAGNOSIS — Z6821 Body mass index (BMI) 21.0-21.9, adult: Secondary | ICD-10-CM | POA: Diagnosis not present

## 2021-01-14 DIAGNOSIS — J301 Allergic rhinitis due to pollen: Secondary | ICD-10-CM | POA: Diagnosis not present

## 2021-01-14 DIAGNOSIS — J3089 Other allergic rhinitis: Secondary | ICD-10-CM | POA: Diagnosis not present

## 2021-01-21 DIAGNOSIS — J301 Allergic rhinitis due to pollen: Secondary | ICD-10-CM | POA: Diagnosis not present

## 2021-01-21 DIAGNOSIS — J3089 Other allergic rhinitis: Secondary | ICD-10-CM | POA: Diagnosis not present

## 2021-01-28 ENCOUNTER — Other Ambulatory Visit: Payer: Self-pay

## 2021-01-28 ENCOUNTER — Ambulatory Visit (INDEPENDENT_AMBULATORY_CARE_PROVIDER_SITE_OTHER): Payer: Medicare Other | Admitting: Dermatology

## 2021-01-28 ENCOUNTER — Encounter: Payer: Self-pay | Admitting: Dermatology

## 2021-01-28 DIAGNOSIS — L814 Other melanin hyperpigmentation: Secondary | ICD-10-CM | POA: Diagnosis not present

## 2021-01-28 DIAGNOSIS — D229 Melanocytic nevi, unspecified: Secondary | ICD-10-CM

## 2021-01-28 DIAGNOSIS — L821 Other seborrheic keratosis: Secondary | ICD-10-CM

## 2021-01-28 DIAGNOSIS — D225 Melanocytic nevi of trunk: Secondary | ICD-10-CM

## 2021-01-28 DIAGNOSIS — J301 Allergic rhinitis due to pollen: Secondary | ICD-10-CM | POA: Diagnosis not present

## 2021-01-28 DIAGNOSIS — J3089 Other allergic rhinitis: Secondary | ICD-10-CM | POA: Diagnosis not present

## 2021-01-28 DIAGNOSIS — L905 Scar conditions and fibrosis of skin: Secondary | ICD-10-CM | POA: Diagnosis not present

## 2021-01-28 NOTE — Patient Instructions (Signed)

## 2021-02-04 DIAGNOSIS — J3089 Other allergic rhinitis: Secondary | ICD-10-CM | POA: Diagnosis not present

## 2021-02-04 DIAGNOSIS — J4530 Mild persistent asthma, uncomplicated: Secondary | ICD-10-CM | POA: Diagnosis not present

## 2021-02-04 DIAGNOSIS — H1045 Other chronic allergic conjunctivitis: Secondary | ICD-10-CM | POA: Diagnosis not present

## 2021-02-04 DIAGNOSIS — E039 Hypothyroidism, unspecified: Secondary | ICD-10-CM | POA: Diagnosis not present

## 2021-02-04 DIAGNOSIS — J301 Allergic rhinitis due to pollen: Secondary | ICD-10-CM | POA: Diagnosis not present

## 2021-02-04 DIAGNOSIS — E7849 Other hyperlipidemia: Secondary | ICD-10-CM | POA: Diagnosis not present

## 2021-02-04 DIAGNOSIS — K219 Gastro-esophageal reflux disease without esophagitis: Secondary | ICD-10-CM | POA: Diagnosis not present

## 2021-02-10 ENCOUNTER — Encounter: Payer: Self-pay | Admitting: Dermatology

## 2021-02-10 NOTE — Progress Notes (Signed)
   Follow-Up Visit   Subjective  Kathryn [name] is a 65 y.o. female who presents for the following: Procedure (Wider shave).  Moderate to severely atypical mole back Location:  Duration:  Quality:  Associated Signs/Symptoms: Modifying Factors:  Severity:  Timing: Context: For wider shave excision  Objective  Well appearing patient in no apparent distress; mood and affect are within normal limits. Objective  Mid Back: Moderate to severely atypical mole with narrow margin; discussed with Neoma Laming obtaining a slightly wider deeper shave excision which she understood and is agreeable to.    A focused examination was performed including Torso.. Relevant physical exam findings are noted in the Assessment and Plan.   Assessment & Plan    Atypical mole Mid Back  1.5 mm wider deeper shave excision  Epidermal / dermal shaving - Mid Back  Lesion diameter (cm):  1.3 Informed consent: discussed and consent obtained   Timeout: patient name, date of birth, surgical site, and procedure verified   Procedure prep:  Patient was prepped and draped in usual sterile fashion Prep type:  Chlorhexidine Anesthesia: the lesion was anesthetized in a standard fashion   Anesthetic:  1% lidocaine w/ epinephrine 1-100,000 local infiltration Instrument used: DermaBlade   Hemostasis achieved with: ferric subsulfate   Outcome: patient tolerated procedure well   Post-procedure details: sterile dressing applied and wound care instructions given   Dressing type: petrolatum and bandage    Specimen 1 - Surgical pathology Differential Diagnosis: R/O Atypia [id number]  Check Margins: No  Seborrheic keratosis (4) Chest - Medial (Center); Left Inframammary Fold; Right Inframammary Fold; Left Lower Back      I, Lavonna Monarch, MD, have reviewed all documentation for this visit.  The documentation on 02/10/21 for the exam, diagnosis, procedures, and orders are all accurate and complete.

## 2021-02-11 DIAGNOSIS — J301 Allergic rhinitis due to pollen: Secondary | ICD-10-CM | POA: Diagnosis not present

## 2021-02-11 DIAGNOSIS — J3089 Other allergic rhinitis: Secondary | ICD-10-CM | POA: Diagnosis not present

## 2021-02-18 DIAGNOSIS — E039 Hypothyroidism, unspecified: Secondary | ICD-10-CM | POA: Diagnosis not present

## 2021-02-18 DIAGNOSIS — E2749 Other adrenocortical insufficiency: Secondary | ICD-10-CM | POA: Diagnosis not present

## 2021-02-18 DIAGNOSIS — J301 Allergic rhinitis due to pollen: Secondary | ICD-10-CM | POA: Diagnosis not present

## 2021-02-18 DIAGNOSIS — J3089 Other allergic rhinitis: Secondary | ICD-10-CM | POA: Diagnosis not present

## 2021-02-25 DIAGNOSIS — E2749 Other adrenocortical insufficiency: Secondary | ICD-10-CM | POA: Diagnosis not present

## 2021-02-25 DIAGNOSIS — E039 Hypothyroidism, unspecified: Secondary | ICD-10-CM | POA: Diagnosis not present

## 2021-02-25 DIAGNOSIS — J301 Allergic rhinitis due to pollen: Secondary | ICD-10-CM | POA: Diagnosis not present

## 2021-02-25 DIAGNOSIS — J3089 Other allergic rhinitis: Secondary | ICD-10-CM | POA: Diagnosis not present

## 2021-03-04 DIAGNOSIS — J301 Allergic rhinitis due to pollen: Secondary | ICD-10-CM | POA: Diagnosis not present

## 2021-03-04 DIAGNOSIS — J3089 Other allergic rhinitis: Secondary | ICD-10-CM | POA: Diagnosis not present

## 2021-03-11 DIAGNOSIS — J3089 Other allergic rhinitis: Secondary | ICD-10-CM | POA: Diagnosis not present

## 2021-03-11 DIAGNOSIS — J301 Allergic rhinitis due to pollen: Secondary | ICD-10-CM | POA: Diagnosis not present

## 2021-03-17 DIAGNOSIS — J301 Allergic rhinitis due to pollen: Secondary | ICD-10-CM | POA: Diagnosis not present

## 2021-03-17 DIAGNOSIS — J3089 Other allergic rhinitis: Secondary | ICD-10-CM | POA: Diagnosis not present

## 2021-03-24 DIAGNOSIS — J301 Allergic rhinitis due to pollen: Secondary | ICD-10-CM | POA: Diagnosis not present

## 2021-03-24 DIAGNOSIS — J3089 Other allergic rhinitis: Secondary | ICD-10-CM | POA: Diagnosis not present

## 2021-04-01 DIAGNOSIS — J3089 Other allergic rhinitis: Secondary | ICD-10-CM | POA: Diagnosis not present

## 2021-04-01 DIAGNOSIS — J301 Allergic rhinitis due to pollen: Secondary | ICD-10-CM | POA: Diagnosis not present

## 2021-04-03 ENCOUNTER — Encounter (INDEPENDENT_AMBULATORY_CARE_PROVIDER_SITE_OTHER): Payer: Self-pay | Admitting: Gastroenterology

## 2021-04-03 ENCOUNTER — Ambulatory Visit (INDEPENDENT_AMBULATORY_CARE_PROVIDER_SITE_OTHER): Payer: Medicare Other | Admitting: Gastroenterology

## 2021-04-03 ENCOUNTER — Other Ambulatory Visit: Payer: Self-pay

## 2021-04-03 VITALS — BP 125/71 | HR 66 | Temp 97.8°F | Ht 67.0 in | Wt 132.8 lb

## 2021-04-03 DIAGNOSIS — K921 Melena: Secondary | ICD-10-CM

## 2021-04-03 DIAGNOSIS — R1319 Other dysphagia: Secondary | ICD-10-CM | POA: Diagnosis not present

## 2021-04-03 DIAGNOSIS — K219 Gastro-esophageal reflux disease without esophagitis: Secondary | ICD-10-CM | POA: Diagnosis not present

## 2021-04-03 DIAGNOSIS — K5900 Constipation, unspecified: Secondary | ICD-10-CM | POA: Diagnosis not present

## 2021-04-03 DIAGNOSIS — K224 Dyskinesia of esophagus: Secondary | ICD-10-CM | POA: Diagnosis not present

## 2021-04-03 MED ORDER — OMEPRAZOLE 40 MG PO CPDR: 40.0000 mg | DELAYED_RELEASE_CAPSULE | Freq: Every day | ORAL | 1 refills | Status: DC

## 2021-04-03 NOTE — Progress Notes (Signed)
Referring Provider: [facility] Primary Care Physician:  [facility] Primary GI Physician: Dr. Jenetta Downer  Chief Complaint  Patient presents with   Follow-up    Patient is here today for a follow up. She has lower abdominal pains at times and takes bentyl prn. She stays constipated and takes a stool softener as needed. She says she has accidents, where the stools comes out in "balls" and she does not realize it. She has issues with gas,bloating, and heart burn she was taking Pantoprazole,but has ran out and needs something else called in as it did not seem to help. She has maybe two bm's per week. Appetite is good. Having issues with swallowing.   HPI:   Kathryn [name] is a 65 y.o. female presenting today with a history of anxiety, arthritis, dysphagia with previous esophageal dilations, s/p Nissen fundoplication (99991111), GERD, IBS, adrenal insufficiency, RLS and GB. Patient here today for follow up of GERD. Last seen by our office in 2020.   GERD: Previously on pantoprazole, however, she states that she feels like it has decreased in effectiveness since she has been on it for a long period of time. Recently ran out, however, c/o episodes of reflux 2-3x per week when she was taking pantoprazole. Requesting to try a different PPI.    Dysphagia: Endorses 2-3 episodes per week of dysphagia, feeling like food is stuck in her esophagus. States it is worse with potatoes, breads and drier foods, less prevalent with more moist foods, she sometimes feels like food will not go down and she needs to try and bring it back up since drinking liquids does not provide relief.   Constipation: Reports ongoing constipation. She is taking stool softeners 1-2x per week. She endorses sometimes these help. States that she often has small stool balls come out when having a BM, occasionally she will have fecal incontinence and states that she notices some mucus in her stools.  Questionable melena at times. She endorses occasional associated abdominal cramping, however, this is rare. Has bentyl that she takes as needed. Recommended to use miralax in the past, however, she states that when she did, it gave her diarrhea so she discontinued it. She does not drink much water.   Last Colonoscopy:(09/20/14) Prep was excellent except she had thick layer of stool coating cecal and ascending colon mucosa. Cecal landmarks were well identified after vigorous washing. Small lymphoid polyp ablated via cold biopsy from appendiceal stump.Mucosa of rest of the colon was normal. Normal mucosa of rectum and anorectal junction.   Last Endoscopy:(05/29/19) Normal esophagus. - Z-line, 38 cm from the incisors. - 2 cm hiatal hernia. - No endoscopic esophageal abnormality to explain patient's dysphagia. Esophagus dilated. - A Nissen fundoplication was found. The wrap appears loose. - Normal duodenal bulb and second portion of the duodenum. - No specimens collected.  Esophageal manometry: (09/23/16) no evidence of esophageal peristaltic abnormality.   PH impedence study: (09/23/16) evidence of significant GERD  Recommendations:  Screening colonoscopy 2026  Past Medical History:  Diagnosis Date   Adrenal insufficiency ([place])    occured following steroid shoulder injections ~ spring 2017   Anxiety    pt. reports that she has RLS- takes Xanax for calming her self so she can settle & sleep    Arthritis    shoulder & back, knees & leg     Asthma    aleery related   Atypical mole 11/12/2020   mod-severe- mid back  Chest pain, atypical    had cardiac workup - deemed related to anxiety   Chronic bronchitis ([place])    "less since I quit smoking" (02/21/2016)   Chronic lower back pain    Dyspnea    related to spray paint that is put in  beer cans at her place of employment.  Pt. also reports that she has anxiety also that leads to SOB., Pt. reports her Mother just died a week ago     GERD  (gastroesophageal reflux disease)    Guillain-Barre syndrome following vaccination ([place]) 1970's   post swine flu shot, hosp. for treatment for 1 month    H/O dizziness    H/O hiatal hernia    Hepatitis    hepatitis as a child; "got it from a little girl at school" make sick and vomiting   Herpes simplex    History of Guillain-Barre syndrome ~ 1978   "from the swine flu shot"   IBS (irritable bowel syndrome)    Pneumonia    PONV (postoperative nausea and vomiting)    Restless leg syndrome    Walking pneumonia ~ 2011    Past Surgical History:  Procedure Laterality Date   60 HOUR [place] STUDY N/A 09/23/2016   Procedure: 24 HOUR [place] STUDY;  Surgeon: Mauri Pole, MD;  Location: [facility];  Service: Endoscopy;  Laterality: N/A;   ANTERIOR CERVICAL DECOMP/DISCECTOMY FUSION  2000 & 2015   two times ago- C5 and C6, Dr. Otilio Connors, MD 2015   APPENDECTOMY  1980s   BACK SURGERY     CHOLECYSTECTOMY OPEN  1980s   COLONOSCOPY N/A 09/20/2014   Procedure: COLONOSCOPY;  Surgeon: Rogene Houston, MD;  [facility]: [facility];  Service: Endoscopy;  Laterality: N/A;  200   ESOPHAGEAL DILATION N/A 05/29/2019   Procedure: ESOPHAGEAL DILATION;  Surgeon: Rogene Houston, MD;  [facility];  Service: Endoscopy;  Laterality: N/A;   ESOPHAGEAL MANOMETRY N/A 09/23/2016   Procedure: ESOPHAGEAL MANOMETRY (EM);  Surgeon: Mauri Pole, MD;  [facility]: [facility];  Service: Endoscopy;  Laterality: N/A;   ESOPHAGOGASTRODUODENOSCOPY N/A 09/20/2014   Procedure: ESOPHAGOGASTRODUODENOSCOPY (EGD);  Surgeon: Rogene Houston, MD;  [facility];  Service: Endoscopy;  Laterality: N/A;   ESOPHAGOGASTRODUODENOSCOPY (EGD) WITH PROPOFOL N/A 05/29/2019   Procedure: ESOPHAGOGASTRODUODENOSCOPY (EGD) WITH PROPOFOL;  Surgeon: Rogene Houston, MD;  [facility];  Service: Endoscopy;  Laterality: N/A;  2:30-moved up to 9:40am per office   HIATAL HERNIA REPAIR N/A 11/27/2016   Procedure:  LAPAROSCOPIC REPAIR OF HIATAL HERNIA WITH NISSEN FUNDOPLICATION;  Surgeon: Jackolyn Confer, MD;  Location: [facility];  Service: General;  Laterality: N/A;   INCISION AND DRAINAGE OF WOUND Left 02/21/2016   forearm, minor   LUMBAR LAMINECTOMY/DECOMPRESSION MICRODISCECTOMY Right 06/22/2013   Procedure: RIGHT LUMBAR TWO-THREE DISCECTOMY;  Surgeon: Otilio Connors, MD;  Location: [facility];  Service: Neurosurgery;  Laterality: Right;  Right    MALONEY DILATION N/A 09/20/2014   Procedure: Venia Minks DILATION;  Surgeon: Rogene Houston, MD;  [facility];  Service: Endoscopy;  Laterality: N/A;   MINOR IRRIGATION AND DEBRIDEMENT OF WOUND Left 02/21/2016   Procedure: MINOR IRRIGATION AND DEBRIDEMENT/REPAIR OF LEFT ARM WOUND;  Surgeon: Netta Cedars, MD;  Location: [facility];  Service: Orthopedics;  Laterality: Left;   REVERSE SHOULDER ARTHROPLASTY Right 02/21/2016   Procedure: RIGHT REVERSE TOTAL SHOULDER ARTHROPLASTY;  Surgeon: Netta Cedars, MD;  Location: [facility];  Service: Orthopedics;  Laterality:  Right;   REVERSE TOTAL SHOULDER ARTHROPLASTY Right 02/21/2016   SHOULDER ARTHROSCOPY W/ ROTATOR CUFF REPAIR Right 1990s   TOTAL SHOULDER REPLACEMENT     Right   VAGINAL HYSTERECTOMY      Current Outpatient Medications  Medication Sig Dispense Refill   acetaminophen (TYLENOL) 500 MG tablet Take 500-1,000 mg by mouth every 6 (six) hours as needed (For pain.).     albuterol (PROVENTIL HFA;VENTOLIN HFA) 108 (90 Base) MCG/ACT inhaler Inhale 2 puffs into the lungs every 4 (four) hours as needed for wheezing or shortness of breath.     ALPRAZolam (XANAX) 0.5 MG tablet Take 0.5 mg by mouth as needed for anxiety.     Ascorbic Acid (VITAMIN C) 1000 MG tablet Take 1,000 mg by mouth daily.      Biotin 10000 MCG TABS Take 10,000 mcg by mouth daily.      Calcium Carbonate-Vitamin D (CALTRATE 600+D PO) Take 1 tablet by mouth daily.     Cholecalciferol (VITAMIN D-3) 125 MCG (5000 UT) TABS Take 5,000 Units by mouth daily.       dicyclomine (BENTYL) 10 MG capsule TAKE 1 CAPSULE (10 MG TOTAL) BY MOUTH DAILY AS NEEDED (IRRITABLE BOWEL SYNDROME). 30 capsule 0   fluticasone (FLONASE) 50 MCG/ACT nasal spray Place 2 sprays into both nostrils daily.      ibuprofen (ADVIL) 200 MG tablet Take 400-600 mg by mouth every 6 (six) hours as needed for moderate pain.     levothyroxine (SYNTHROID) 25 MCG tablet Take 25 mcg by mouth daily before breakfast.     loratadine (CLARITIN) 10 MG tablet Take 10 mg by mouth daily.     montelukast (SINGULAIR) 10 MG tablet Take 10 mg by mouth every morning.      Multiple Vitamins-Minerals (CENTRUM SILVER 50+WOMEN PO) Take 1 tablet by mouth daily.      pantoprazole (PROTONIX) 40 MG tablet Take 1 tablet (40 mg total) by mouth daily. 90 tablet 1   traMADol (ULTRAM) 50 MG tablet Take 50-100 mg by mouth every 12 (twelve) hours as needed for severe pain.      valACYclovir (VALTREX) 1000 MG tablet Take 1,000 mg by mouth daily.     No current facility-administered medications for this visit.    Allergies as of 04/03/2021 - Review Complete 02/10/2021  Allergen Reaction Noted   Niacin and related Shortness Of Breath, Swelling, and Other (See Comments) 06/14/2013   Codeine Swelling and Other (See Comments) 06/14/2013   Zinc Swelling 02/07/2016    Family History  Problem Relation Age of Onset   Congestive Heart Failure Mother    Atrial fibrillation Mother    Hiatal hernia Mother    Cancer Father        lung   Heart attack Brother    Stroke Paternal Grandfather    Diabetes Paternal Grandfather    Alzheimer's disease Paternal Grandmother     Social History   Socioeconomic History   Marital status: Married    Spouse name: Not on file   Number of children: Not on file   Years of education: Not on file   Highest education level: Not on file  Occupational History   Occupation: Therapist, sports  Tobacco Use   Smoking status: Former    Packs/day: 3.00    Years: 10.00    Pack years: 30.00     Types: Cigarettes    Quit date: 05/09/1999    Years since quitting: 21.9   Smokeless tobacco: Never   Tobacco comments:    "  quit smoking in ~ 2003"  Vaping Use   Vaping Use: Never used  Substance and Sexual Activity   Alcohol use: No    Comment: 02/21/2016 "I used to drink a little bit; probably quit in my 20s"   Drug use: No   Sexual activity: Yes    Birth control/protection: Surgical    Comment: hyst  Other Topics Concern   Not on file  Social History Narrative   Walks but no other activity    Anxiety   Social Determinants of Health   Financial Resource Strain: Not on file  Food Insecurity: Not on file  Transportation Needs: Not on file  Physical Activity: Not on file  Stress: Not on file  Social Connections: Not on file    Review of Systems: Gen: Denies fever, chills, anorexia. Denies fatigue, weakness, weight loss.  CV: Denies chest pain, palpitations, syncope, peripheral edema, and claudication. Resp: Denies dyspnea at rest, cough, wheezing, coughing up blood, and pleurisy. GI: Denies vomiting blood, jaundice, and fecal incontinence. Denies dysphagia or odynophagia. Derm: Denies rash, itching, dry skin Psych: Denies depression, anxiety, memory loss, confusion. No homicidal or suicidal ideation.  Heme: Denies bruising, bleeding, and enlarged lymph nodes.  Physical Exam: BP 125/71 (BP Location: Right Arm, Patient Position: Sitting, Cuff Size: Small)   Pulse 66   Temp 97.8 F (36.6 C) (Oral)   Ht '5\' 7"'$  (1.702 m)   Wt 132 lb 12.8 oz (60.2 kg)   BMI 20.80 kg/m  General:   Alert and oriented. No distress noted. Pleasant and cooperative.  Heart: Normal rate and rhythm, s1 and s2 heart sounds present.  Lungs: Clear lung sounds in all lobes. Respirations equal and unlabored. Abdomen:  +BS, soft, non-distended. No rebound or guarding. No HSM or masses noted. Mild Tenderness with deep palpation around umbilicus Derm: No palmar erythema or jaundice Msk:  Symmetrical  without gross deformities. Normal posture. Extremities:  Without edema. Neurologic:  Alert and  oriented x4 Psych:  Alert and cooperative. Normal mood and affect.  ASSESSMENT: Kathryn [name] is a 65 y.o. female presenting today with c/o constipation, abdominal pain and GERD.   Patient was previously on pantoprazole '40mg'$  daily and states she continued to have breakthrough episodes of reflux 2-3 times per week. She recently ran out of pantoprazole and reflux symptoms have subsequently worsened. She inquired about trying a different PPI. We discussed trial of omeprazole '40mg'$  once daily, can increase to BID after 3 months if reflux has not improved. Will reevaluate after that time, patient may need to have repeat [place] impedence study again (previously done in 2017), however, she stated she did not want to repeat this procedure unless absolutely necessary.   Dysphagia has been an ongoing issue for patient. She reports it seems to have gotten worse after Nissen Fundoplication was done (99991111). States that she has frequent episodes of dysphagia per week, will often have to bring food back up, as it will not pass even with drinking liquids. Typically has more issues with drier foods becoming stuck. Previous esophageal dilation in 2020, however, no stricture was noted at that time.   There is question of some episodes of melena recently, however, patient is somewhat of a poor historian and was unable to definitively say if she has had black stools. Will check a CBC today to evaluate for any drops in hemoglobin.   We discussed that EGD is warranted to further evaluate her dysphagia and questionable melena, however, patient did not want to  proceed with EGD at this time. We will readdress the need for EGD with patient if CBC reveals any anemia.  Constipation is ongoing, and she often has small stool balls come out when having a BM. Occasional fecal incontinence that I suspect could be related to some overflow due  to constipation. She endorses occasional associated abdominal cramping, however, this is rare. Has bentyl that she takes as needed. Recommended to use miralax in the past, however, she states that when she did, it gave her diarrhea so she discontinued it. We discussed patient trying benefiber daily and increasing her water intake to see if this helps her have more frequent, formed bowel movements. If benefiber does not provide relief, can try miralax, 1 capful per day, or decrease to every other day if she is having a lot of diarrhea.    PLAN:  Start Omeprazole '40mg'$  daily for reflux, take 30-45 minutes prior to breakfast, reassess in 3 months for need to increase to twice daily dosing 2. trial Benefiber daily for constipation, if this does not work, you can do miralax 1 capful daily or every other day if frequent diarrhea occurs. 3. Increase your water intake  4. Continue to take small bites and chew thoroughly, avoid dry foods to prevent choking. 5. Will check CBC today for anemia, if hemoglobin low, readdress possibility of proceeding EGD with patient.  Follow Up: 3 months  Discussed case with Dr. Jenetta Downer who is in agreement with plan of care as listed above.  Geremy Rister L. Alver Sorrow, MSN, APRN, AGNP-C Adult-Gerontology Nurse Practitioner [facility]

## 2021-04-03 NOTE — Patient Instructions (Signed)
We will order CBC to check your blood counts today. We should proceed with an EGD if your labs indicate blood loss.   Start omeprazole '40mg'$  daily, take in the morning 30-45 minutes before breakfast, we will reassess how this is working for you after 3 months and can increase to twice daily if needed.  You can try benefiber daily and increase water intake to see if this helps with your constipation. If you do not have any results from benefiber, we can try miralax, 1 capful every day.

## 2021-04-08 DIAGNOSIS — J3089 Other allergic rhinitis: Secondary | ICD-10-CM | POA: Diagnosis not present

## 2021-04-08 DIAGNOSIS — K921 Melena: Secondary | ICD-10-CM | POA: Diagnosis not present

## 2021-04-08 DIAGNOSIS — J301 Allergic rhinitis due to pollen: Secondary | ICD-10-CM | POA: Diagnosis not present

## 2021-04-08 LAB — CBC WITH DIFFERENTIAL/PLATELET
Absolute Monocytes: 560 cells/uL (ref 200–950)
Basophils Absolute: 30 cells/uL (ref 0–200)
Basophils Relative: 0.6 %
Eosinophils Absolute: 190 cells/uL (ref 15–500)
Eosinophils Relative: 3.8 %
HCT: 36 % (ref 35.0–45.0)
Hemoglobin: 11.9 g/dL (ref 11.7–15.5)
Lymphs Abs: 1505 cells/uL (ref 850–3900)
MCH: 32.2 pg (ref 27.0–33.0)
MCHC: 33.1 g/dL (ref 32.0–36.0)
MCV: 97.6 fL (ref 80.0–100.0)
MPV: 11 fL (ref 7.5–12.5)
Monocytes Relative: 11.2 %
Neutro Abs: 2715 cells/uL (ref 1500–7800)
Neutrophils Relative %: 54.3 %
Platelets: 257 10*3/uL (ref 140–400)
RBC: 3.69 10*6/uL — ABNORMAL LOW (ref 3.80–5.10)
RDW: 12.2 % (ref 11.0–15.0)
Total Lymphocyte: 30.1 %
WBC: 5 10*3/uL (ref 3.8–10.8)

## 2021-04-09 ENCOUNTER — Telehealth (INDEPENDENT_AMBULATORY_CARE_PROVIDER_SITE_OTHER): Payer: Self-pay | Admitting: Gastroenterology

## 2021-04-09 ENCOUNTER — Other Ambulatory Visit (INDEPENDENT_AMBULATORY_CARE_PROVIDER_SITE_OTHER): Payer: Self-pay | Admitting: Gastroenterology

## 2021-04-09 DIAGNOSIS — K581 Irritable bowel syndrome with constipation: Secondary | ICD-10-CM

## 2021-04-09 DIAGNOSIS — R197 Diarrhea, unspecified: Secondary | ICD-10-CM | POA: Insufficient documentation

## 2021-04-09 MED ORDER — DICYCLOMINE HCL 10 MG PO CAPS: 10.0000 mg | ORAL_CAPSULE | Freq: Two times a day (BID) | ORAL | 0 refills | Status: DC | PRN

## 2021-04-09 NOTE — Progress Notes (Signed)
Spoke with patient regarding her call about new onset of diarrhea and abdominal pain that began on Sunday. States 5-6 BMs per day with a lot of flatus and bloating. She denies fevers or chills, no blood, some dark stools but she denies black stools. Advised patient that bentyl prescription has been sent and stool studies ordered for further evaluation. Hold off on benefiber/miralax until diarrhea has subsided. Patient advised to let us know if symptoms do not improve.

## 2021-04-09 NOTE — Telephone Encounter (Signed)
Seen 04/03/2021 by Vikki Ports. Patient called today stating she is having a lot of lower abdominal pain, and mucus in stools, no fever no sight of bright red blood, but has seen dark stools. She is having some diarrhea and 5-6 bm's per day. She says she has been taking old dicyclomine from 2016. Says this is not helping she would like to try something else, but needs the insurance to cover. I advised that the dicyclomine may not be working as it is old. Please advise.

## 2021-04-09 NOTE — Telephone Encounter (Signed)
Patient left voice mail message stating she was just in the office last week - she states she is in a lot of pain - please advise - ph# [phone]

## 2021-04-10 ENCOUNTER — Other Ambulatory Visit (INDEPENDENT_AMBULATORY_CARE_PROVIDER_SITE_OTHER): Payer: Self-pay

## 2021-04-10 DIAGNOSIS — K581 Irritable bowel syndrome with constipation: Secondary | ICD-10-CM

## 2021-04-10 MED ORDER — DICYCLOMINE HCL 10 MG PO CAPS: 10.0000 mg | ORAL_CAPSULE | Freq: Two times a day (BID) | ORAL | 0 refills | Status: DC | PRN

## 2021-04-10 NOTE — Progress Notes (Signed)
CBC was normal, no concerns of anemia found at this time.

## 2021-04-10 NOTE — Telephone Encounter (Signed)
Patient aware of all. She will come by the office to pick up the orders for [facility] to pick up the container. She is aware we have submitted her medication to [facility].

## 2021-04-11 ENCOUNTER — Telehealth (INDEPENDENT_AMBULATORY_CARE_PROVIDER_SITE_OTHER): Payer: Self-pay | Admitting: *Deleted

## 2021-04-11 DIAGNOSIS — R197 Diarrhea, unspecified: Secondary | ICD-10-CM | POA: Diagnosis not present

## 2021-04-11 NOTE — Telephone Encounter (Signed)
PA sent through cover my meds on 04/09/21. Approval for dicyclomine '10mg'$  one bid for IBS #30 was received on 8/4. Approved through 04/10/22. [facility] [address] called and notified med should go through now and left message for pharmacist to call office if any problems.

## 2021-04-14 ENCOUNTER — Other Ambulatory Visit (INDEPENDENT_AMBULATORY_CARE_PROVIDER_SITE_OTHER): Payer: Self-pay | Admitting: *Deleted

## 2021-04-14 LAB — C. DIFFICILE GDH AND TOXIN A/B
GDH ANTIGEN: DETECTED
MICRO NUMBER:: 12209218
SPECIMEN QUALITY:: ADEQUATE
TOXIN A AND B: DETECTED

## 2021-04-14 LAB — GASTROINTESTINAL PATHOGEN PANEL PCR
C. difficile Tox A/B, PCR: DETECTED — AB
Campylobacter, PCR: NOT DETECTED
Cryptosporidium, PCR: NOT DETECTED
E coli (ETEC) LT/ST PCR: NOT DETECTED
E coli (STEC) stx1/stx2, PCR: NOT DETECTED
E coli 0157, PCR: NOT DETECTED
Giardia lamblia, PCR: NOT DETECTED
Norovirus, PCR: NOT DETECTED
Rotavirus A, PCR: NOT DETECTED
Salmonella, PCR: NOT DETECTED
Shigella, PCR: NOT DETECTED

## 2021-04-14 MED ORDER — VANCOMYCIN HCL 125 MG PO CAPS: 125.0000 mg | ORAL_CAPSULE | Freq: Four times a day (QID) | ORAL | 0 refills | Status: AC

## 2021-04-14 MED ORDER — VANCOMYCIN HCL 125 MG PO CAPS: 125.0000 mg | ORAL_CAPSULE | Freq: Four times a day (QID) | ORAL | 0 refills | Status: DC

## 2021-04-14 NOTE — Addendum Note (Signed)
Addended by: Gabriel Rung on: 04/14/2021 09:19 AM   Modules accepted: Orders

## 2021-04-15 DIAGNOSIS — J301 Allergic rhinitis due to pollen: Secondary | ICD-10-CM | POA: Diagnosis not present

## 2021-04-15 DIAGNOSIS — J3089 Other allergic rhinitis: Secondary | ICD-10-CM | POA: Diagnosis not present

## 2021-04-15 DIAGNOSIS — J3081 Allergic rhinitis due to animal (cat) (dog) hair and dander: Secondary | ICD-10-CM | POA: Diagnosis not present

## 2021-04-22 DIAGNOSIS — J301 Allergic rhinitis due to pollen: Secondary | ICD-10-CM | POA: Diagnosis not present

## 2021-04-22 DIAGNOSIS — J3089 Other allergic rhinitis: Secondary | ICD-10-CM | POA: Diagnosis not present

## 2021-05-05 DIAGNOSIS — J301 Allergic rhinitis due to pollen: Secondary | ICD-10-CM | POA: Diagnosis not present

## 2021-05-05 DIAGNOSIS — J3089 Other allergic rhinitis: Secondary | ICD-10-CM | POA: Diagnosis not present

## 2021-05-13 DIAGNOSIS — J3089 Other allergic rhinitis: Secondary | ICD-10-CM | POA: Diagnosis not present

## 2021-05-13 DIAGNOSIS — J301 Allergic rhinitis due to pollen: Secondary | ICD-10-CM | POA: Diagnosis not present

## 2021-05-20 DIAGNOSIS — J301 Allergic rhinitis due to pollen: Secondary | ICD-10-CM | POA: Diagnosis not present

## 2021-05-20 DIAGNOSIS — J3089 Other allergic rhinitis: Secondary | ICD-10-CM | POA: Diagnosis not present

## 2021-05-26 DIAGNOSIS — J3089 Other allergic rhinitis: Secondary | ICD-10-CM | POA: Diagnosis not present

## 2021-05-26 DIAGNOSIS — J301 Allergic rhinitis due to pollen: Secondary | ICD-10-CM | POA: Diagnosis not present

## 2021-06-03 DIAGNOSIS — J3089 Other allergic rhinitis: Secondary | ICD-10-CM | POA: Diagnosis not present

## 2021-06-03 DIAGNOSIS — J301 Allergic rhinitis due to pollen: Secondary | ICD-10-CM | POA: Diagnosis not present

## 2021-06-10 DIAGNOSIS — F32A Depression, unspecified: Secondary | ICD-10-CM | POA: Diagnosis not present

## 2021-06-10 DIAGNOSIS — J3089 Other allergic rhinitis: Secondary | ICD-10-CM | POA: Diagnosis not present

## 2021-06-10 DIAGNOSIS — J301 Allergic rhinitis due to pollen: Secondary | ICD-10-CM | POA: Diagnosis not present

## 2021-06-17 DIAGNOSIS — J301 Allergic rhinitis due to pollen: Secondary | ICD-10-CM | POA: Diagnosis not present

## 2021-06-17 DIAGNOSIS — J3089 Other allergic rhinitis: Secondary | ICD-10-CM | POA: Diagnosis not present

## 2021-06-24 DIAGNOSIS — J301 Allergic rhinitis due to pollen: Secondary | ICD-10-CM | POA: Diagnosis not present

## 2021-06-24 DIAGNOSIS — J3089 Other allergic rhinitis: Secondary | ICD-10-CM | POA: Diagnosis not present

## 2021-07-01 DIAGNOSIS — J301 Allergic rhinitis due to pollen: Secondary | ICD-10-CM | POA: Diagnosis not present

## 2021-07-01 DIAGNOSIS — J3089 Other allergic rhinitis: Secondary | ICD-10-CM | POA: Diagnosis not present

## 2021-07-02 DIAGNOSIS — J301 Allergic rhinitis due to pollen: Secondary | ICD-10-CM | POA: Diagnosis not present

## 2021-07-02 DIAGNOSIS — J3089 Other allergic rhinitis: Secondary | ICD-10-CM | POA: Diagnosis not present

## 2021-07-08 DIAGNOSIS — J3089 Other allergic rhinitis: Secondary | ICD-10-CM | POA: Diagnosis not present

## 2021-07-08 DIAGNOSIS — J301 Allergic rhinitis due to pollen: Secondary | ICD-10-CM | POA: Diagnosis not present

## 2021-07-15 DIAGNOSIS — J301 Allergic rhinitis due to pollen: Secondary | ICD-10-CM | POA: Diagnosis not present

## 2021-07-15 DIAGNOSIS — J3089 Other allergic rhinitis: Secondary | ICD-10-CM | POA: Diagnosis not present

## 2021-07-17 ENCOUNTER — Ambulatory Visit (INDEPENDENT_AMBULATORY_CARE_PROVIDER_SITE_OTHER): Payer: Medicare Other | Admitting: Gastroenterology

## 2021-07-22 DIAGNOSIS — J301 Allergic rhinitis due to pollen: Secondary | ICD-10-CM | POA: Diagnosis not present

## 2021-07-22 DIAGNOSIS — J3089 Other allergic rhinitis: Secondary | ICD-10-CM | POA: Diagnosis not present

## 2021-07-29 DIAGNOSIS — J3089 Other allergic rhinitis: Secondary | ICD-10-CM | POA: Diagnosis not present

## 2021-07-29 DIAGNOSIS — J301 Allergic rhinitis due to pollen: Secondary | ICD-10-CM | POA: Diagnosis not present

## 2021-08-05 DIAGNOSIS — J3089 Other allergic rhinitis: Secondary | ICD-10-CM | POA: Diagnosis not present

## 2021-08-05 DIAGNOSIS — J301 Allergic rhinitis due to pollen: Secondary | ICD-10-CM | POA: Diagnosis not present

## 2021-08-12 DIAGNOSIS — J3089 Other allergic rhinitis: Secondary | ICD-10-CM | POA: Diagnosis not present

## 2021-08-12 DIAGNOSIS — J301 Allergic rhinitis due to pollen: Secondary | ICD-10-CM | POA: Diagnosis not present

## 2021-08-19 ENCOUNTER — Ambulatory Visit (INDEPENDENT_AMBULATORY_CARE_PROVIDER_SITE_OTHER): Payer: Medicare Other | Admitting: Gastroenterology

## 2021-08-19 DIAGNOSIS — J301 Allergic rhinitis due to pollen: Secondary | ICD-10-CM | POA: Diagnosis not present

## 2021-08-19 DIAGNOSIS — J3089 Other allergic rhinitis: Secondary | ICD-10-CM | POA: Diagnosis not present

## 2021-09-09 DIAGNOSIS — J301 Allergic rhinitis due to pollen: Secondary | ICD-10-CM | POA: Diagnosis not present

## 2021-09-09 DIAGNOSIS — J3089 Other allergic rhinitis: Secondary | ICD-10-CM | POA: Diagnosis not present

## 2021-09-16 DIAGNOSIS — J301 Allergic rhinitis due to pollen: Secondary | ICD-10-CM | POA: Diagnosis not present

## 2021-09-16 DIAGNOSIS — J3089 Other allergic rhinitis: Secondary | ICD-10-CM | POA: Diagnosis not present

## 2021-09-23 DIAGNOSIS — J301 Allergic rhinitis due to pollen: Secondary | ICD-10-CM | POA: Diagnosis not present

## 2021-09-23 DIAGNOSIS — J3089 Other allergic rhinitis: Secondary | ICD-10-CM | POA: Diagnosis not present

## 2021-09-30 DIAGNOSIS — J3089 Other allergic rhinitis: Secondary | ICD-10-CM | POA: Diagnosis not present

## 2021-09-30 DIAGNOSIS — J301 Allergic rhinitis due to pollen: Secondary | ICD-10-CM | POA: Diagnosis not present

## 2021-10-06 DIAGNOSIS — J3089 Other allergic rhinitis: Secondary | ICD-10-CM | POA: Diagnosis not present

## 2021-10-06 DIAGNOSIS — J301 Allergic rhinitis due to pollen: Secondary | ICD-10-CM | POA: Diagnosis not present

## 2021-10-13 DIAGNOSIS — J3089 Other allergic rhinitis: Secondary | ICD-10-CM | POA: Diagnosis not present

## 2021-10-13 DIAGNOSIS — J301 Allergic rhinitis due to pollen: Secondary | ICD-10-CM | POA: Diagnosis not present

## 2021-10-21 DIAGNOSIS — J3089 Other allergic rhinitis: Secondary | ICD-10-CM | POA: Diagnosis not present

## 2021-10-21 DIAGNOSIS — J301 Allergic rhinitis due to pollen: Secondary | ICD-10-CM | POA: Diagnosis not present

## 2021-10-27 DIAGNOSIS — G2581 Restless legs syndrome: Secondary | ICD-10-CM | POA: Diagnosis not present

## 2021-10-27 DIAGNOSIS — R32 Unspecified urinary incontinence: Secondary | ICD-10-CM | POA: Diagnosis not present

## 2021-10-27 DIAGNOSIS — J449 Chronic obstructive pulmonary disease, unspecified: Secondary | ICD-10-CM | POA: Diagnosis not present

## 2021-10-27 DIAGNOSIS — B009 Herpesviral infection, unspecified: Secondary | ICD-10-CM | POA: Diagnosis not present

## 2021-10-27 DIAGNOSIS — K5900 Constipation, unspecified: Secondary | ICD-10-CM | POA: Diagnosis not present

## 2021-10-27 DIAGNOSIS — F419 Anxiety disorder, unspecified: Secondary | ICD-10-CM | POA: Diagnosis not present

## 2021-10-27 DIAGNOSIS — J45909 Unspecified asthma, uncomplicated: Secondary | ICD-10-CM | POA: Diagnosis not present

## 2021-10-27 DIAGNOSIS — F3342 Major depressive disorder, recurrent, in full remission: Secondary | ICD-10-CM | POA: Diagnosis not present

## 2021-10-27 DIAGNOSIS — K589 Irritable bowel syndrome without diarrhea: Secondary | ICD-10-CM | POA: Diagnosis not present

## 2021-10-27 DIAGNOSIS — E039 Hypothyroidism, unspecified: Secondary | ICD-10-CM | POA: Diagnosis not present

## 2021-10-27 DIAGNOSIS — K219 Gastro-esophageal reflux disease without esophagitis: Secondary | ICD-10-CM | POA: Diagnosis not present

## 2021-10-27 DIAGNOSIS — E559 Vitamin D deficiency, unspecified: Secondary | ICD-10-CM | POA: Diagnosis not present

## 2021-10-28 DIAGNOSIS — J301 Allergic rhinitis due to pollen: Secondary | ICD-10-CM | POA: Diagnosis not present

## 2021-10-28 DIAGNOSIS — J3081 Allergic rhinitis due to animal (cat) (dog) hair and dander: Secondary | ICD-10-CM | POA: Diagnosis not present

## 2021-10-28 DIAGNOSIS — J3089 Other allergic rhinitis: Secondary | ICD-10-CM | POA: Diagnosis not present

## 2021-11-04 DIAGNOSIS — J3089 Other allergic rhinitis: Secondary | ICD-10-CM | POA: Diagnosis not present

## 2021-11-04 DIAGNOSIS — J301 Allergic rhinitis due to pollen: Secondary | ICD-10-CM | POA: Diagnosis not present

## 2021-11-12 DIAGNOSIS — Z6822 Body mass index (BMI) 22.0-22.9, adult: Secondary | ICD-10-CM | POA: Diagnosis not present

## 2021-11-12 DIAGNOSIS — M5090 Cervical disc disorder, unspecified, unspecified cervical region: Secondary | ICD-10-CM | POA: Diagnosis not present

## 2021-11-12 DIAGNOSIS — E663 Overweight: Secondary | ICD-10-CM | POA: Diagnosis not present

## 2021-11-12 DIAGNOSIS — M5136 Other intervertebral disc degeneration, lumbar region: Secondary | ICD-10-CM | POA: Diagnosis not present

## 2021-11-12 DIAGNOSIS — F32A Depression, unspecified: Secondary | ICD-10-CM | POA: Diagnosis not present

## 2021-11-13 DIAGNOSIS — J301 Allergic rhinitis due to pollen: Secondary | ICD-10-CM | POA: Diagnosis not present

## 2021-11-13 DIAGNOSIS — J3089 Other allergic rhinitis: Secondary | ICD-10-CM | POA: Diagnosis not present

## 2021-11-19 DIAGNOSIS — J3089 Other allergic rhinitis: Secondary | ICD-10-CM | POA: Diagnosis not present

## 2021-11-19 DIAGNOSIS — J3081 Allergic rhinitis due to animal (cat) (dog) hair and dander: Secondary | ICD-10-CM | POA: Diagnosis not present

## 2021-11-19 DIAGNOSIS — J301 Allergic rhinitis due to pollen: Secondary | ICD-10-CM | POA: Diagnosis not present

## 2021-11-25 DIAGNOSIS — J3081 Allergic rhinitis due to animal (cat) (dog) hair and dander: Secondary | ICD-10-CM | POA: Diagnosis not present

## 2021-11-25 DIAGNOSIS — J301 Allergic rhinitis due to pollen: Secondary | ICD-10-CM | POA: Diagnosis not present

## 2021-11-25 DIAGNOSIS — J3089 Other allergic rhinitis: Secondary | ICD-10-CM | POA: Diagnosis not present

## 2021-12-02 DIAGNOSIS — J3089 Other allergic rhinitis: Secondary | ICD-10-CM | POA: Diagnosis not present

## 2021-12-02 DIAGNOSIS — J301 Allergic rhinitis due to pollen: Secondary | ICD-10-CM | POA: Diagnosis not present

## 2021-12-04 DIAGNOSIS — J301 Allergic rhinitis due to pollen: Secondary | ICD-10-CM | POA: Diagnosis not present

## 2021-12-04 DIAGNOSIS — J3089 Other allergic rhinitis: Secondary | ICD-10-CM | POA: Diagnosis not present

## 2021-12-08 ENCOUNTER — Other Ambulatory Visit (HOSPITAL_COMMUNITY): Payer: Self-pay | Admitting: Family Medicine

## 2021-12-08 DIAGNOSIS — Z87891 Personal history of nicotine dependence: Secondary | ICD-10-CM

## 2021-12-09 DIAGNOSIS — J3089 Other allergic rhinitis: Secondary | ICD-10-CM | POA: Diagnosis not present

## 2021-12-09 DIAGNOSIS — J301 Allergic rhinitis due to pollen: Secondary | ICD-10-CM | POA: Diagnosis not present

## 2021-12-19 DIAGNOSIS — J301 Allergic rhinitis due to pollen: Secondary | ICD-10-CM | POA: Diagnosis not present

## 2021-12-19 DIAGNOSIS — J3089 Other allergic rhinitis: Secondary | ICD-10-CM | POA: Diagnosis not present

## 2021-12-23 DIAGNOSIS — J301 Allergic rhinitis due to pollen: Secondary | ICD-10-CM | POA: Diagnosis not present

## 2021-12-23 DIAGNOSIS — J3081 Allergic rhinitis due to animal (cat) (dog) hair and dander: Secondary | ICD-10-CM | POA: Diagnosis not present

## 2021-12-23 DIAGNOSIS — J3089 Other allergic rhinitis: Secondary | ICD-10-CM | POA: Diagnosis not present

## 2021-12-30 DIAGNOSIS — J301 Allergic rhinitis due to pollen: Secondary | ICD-10-CM | POA: Diagnosis not present

## 2021-12-30 DIAGNOSIS — J3089 Other allergic rhinitis: Secondary | ICD-10-CM | POA: Diagnosis not present

## 2022-01-06 DIAGNOSIS — J3081 Allergic rhinitis due to animal (cat) (dog) hair and dander: Secondary | ICD-10-CM | POA: Diagnosis not present

## 2022-01-06 DIAGNOSIS — J301 Allergic rhinitis due to pollen: Secondary | ICD-10-CM | POA: Diagnosis not present

## 2022-01-06 DIAGNOSIS — J3089 Other allergic rhinitis: Secondary | ICD-10-CM | POA: Diagnosis not present

## 2022-01-08 ENCOUNTER — Other Ambulatory Visit (HOSPITAL_COMMUNITY): Payer: Self-pay | Admitting: Family Medicine

## 2022-01-08 DIAGNOSIS — Z23 Encounter for immunization: Secondary | ICD-10-CM | POA: Diagnosis not present

## 2022-01-08 DIAGNOSIS — E2839 Other primary ovarian failure: Secondary | ICD-10-CM

## 2022-01-08 DIAGNOSIS — Z0001 Encounter for general adult medical examination with abnormal findings: Secondary | ICD-10-CM | POA: Diagnosis not present

## 2022-01-08 DIAGNOSIS — K589 Irritable bowel syndrome without diarrhea: Secondary | ICD-10-CM | POA: Diagnosis not present

## 2022-01-08 DIAGNOSIS — J302 Other seasonal allergic rhinitis: Secondary | ICD-10-CM | POA: Diagnosis not present

## 2022-01-08 DIAGNOSIS — K219 Gastro-esophageal reflux disease without esophagitis: Secondary | ICD-10-CM | POA: Diagnosis not present

## 2022-01-08 DIAGNOSIS — E7849 Other hyperlipidemia: Secondary | ICD-10-CM | POA: Diagnosis not present

## 2022-01-08 DIAGNOSIS — Z87891 Personal history of nicotine dependence: Secondary | ICD-10-CM | POA: Diagnosis not present

## 2022-01-08 DIAGNOSIS — E559 Vitamin D deficiency, unspecified: Secondary | ICD-10-CM | POA: Diagnosis not present

## 2022-01-08 DIAGNOSIS — Z6822 Body mass index (BMI) 22.0-22.9, adult: Secondary | ICD-10-CM | POA: Diagnosis not present

## 2022-01-08 DIAGNOSIS — Z1331 Encounter for screening for depression: Secondary | ICD-10-CM | POA: Diagnosis not present

## 2022-01-08 DIAGNOSIS — E039 Hypothyroidism, unspecified: Secondary | ICD-10-CM | POA: Diagnosis not present

## 2022-01-08 DIAGNOSIS — Z1231 Encounter for screening mammogram for malignant neoplasm of breast: Secondary | ICD-10-CM

## 2022-01-08 DIAGNOSIS — E782 Mixed hyperlipidemia: Secondary | ICD-10-CM | POA: Diagnosis not present

## 2022-01-13 DIAGNOSIS — J301 Allergic rhinitis due to pollen: Secondary | ICD-10-CM | POA: Diagnosis not present

## 2022-01-13 DIAGNOSIS — J3089 Other allergic rhinitis: Secondary | ICD-10-CM | POA: Diagnosis not present

## 2022-01-14 ENCOUNTER — Ambulatory Visit (HOSPITAL_COMMUNITY)
Admission: RE | Admit: 2022-01-14 | Discharge: 2022-01-14 | Disposition: A | Discharge status: Home / Self Care | Payer: PPO | Source: Ambulatory Visit | Attending: Family Medicine | Admitting: Family Medicine

## 2022-01-14 DIAGNOSIS — Z780 Asymptomatic menopausal state: Secondary | ICD-10-CM | POA: Diagnosis not present

## 2022-01-14 DIAGNOSIS — E2839 Other primary ovarian failure: Secondary | ICD-10-CM | POA: Insufficient documentation

## 2022-01-14 DIAGNOSIS — Z13820 Encounter for screening for osteoporosis: Secondary | ICD-10-CM | POA: Insufficient documentation

## 2022-01-14 DIAGNOSIS — M85832 Other specified disorders of bone density and structure, left forearm: Secondary | ICD-10-CM | POA: Insufficient documentation

## 2022-01-14 DIAGNOSIS — Z1231 Encounter for screening mammogram for malignant neoplasm of breast: Secondary | ICD-10-CM | POA: Diagnosis not present

## 2022-01-20 DIAGNOSIS — J3089 Other allergic rhinitis: Secondary | ICD-10-CM | POA: Diagnosis not present

## 2022-01-20 DIAGNOSIS — J301 Allergic rhinitis due to pollen: Secondary | ICD-10-CM | POA: Diagnosis not present

## 2022-01-22 ENCOUNTER — Ambulatory Visit (HOSPITAL_COMMUNITY)
Admission: RE | Admit: 2022-01-22 | Discharge: 2022-01-22 | Disposition: A | Discharge status: Home / Self Care | Payer: PPO | Source: Ambulatory Visit | Attending: Family Medicine | Admitting: Family Medicine

## 2022-01-22 DIAGNOSIS — Z87891 Personal history of nicotine dependence: Secondary | ICD-10-CM | POA: Insufficient documentation

## 2022-01-27 ENCOUNTER — Encounter (HOSPITAL_COMMUNITY): Payer: Self-pay

## 2022-01-27 ENCOUNTER — Ambulatory Visit (HOSPITAL_COMMUNITY): Payer: Medicare Other

## 2022-01-27 DIAGNOSIS — J3081 Allergic rhinitis due to animal (cat) (dog) hair and dander: Secondary | ICD-10-CM | POA: Diagnosis not present

## 2022-01-27 DIAGNOSIS — J301 Allergic rhinitis due to pollen: Secondary | ICD-10-CM | POA: Diagnosis not present

## 2022-01-27 DIAGNOSIS — J3089 Other allergic rhinitis: Secondary | ICD-10-CM | POA: Diagnosis not present

## 2022-01-30 ENCOUNTER — Ambulatory Visit (HOSPITAL_COMMUNITY): Payer: Medicare Other

## 2022-02-04 DIAGNOSIS — H1045 Other chronic allergic conjunctivitis: Secondary | ICD-10-CM | POA: Diagnosis not present

## 2022-02-04 DIAGNOSIS — J4530 Mild persistent asthma, uncomplicated: Secondary | ICD-10-CM | POA: Diagnosis not present

## 2022-02-04 DIAGNOSIS — J301 Allergic rhinitis due to pollen: Secondary | ICD-10-CM | POA: Diagnosis not present

## 2022-02-04 DIAGNOSIS — J3089 Other allergic rhinitis: Secondary | ICD-10-CM | POA: Diagnosis not present

## 2022-02-09 ENCOUNTER — Telehealth (INDEPENDENT_AMBULATORY_CARE_PROVIDER_SITE_OTHER): Payer: PPO | Admitting: Gastroenterology

## 2022-02-10 ENCOUNTER — Encounter (INDEPENDENT_AMBULATORY_CARE_PROVIDER_SITE_OTHER): Payer: Self-pay | Admitting: Gastroenterology

## 2022-02-10 ENCOUNTER — Ambulatory Visit (INDEPENDENT_AMBULATORY_CARE_PROVIDER_SITE_OTHER): Payer: PPO | Admitting: Gastroenterology

## 2022-02-10 ENCOUNTER — Other Ambulatory Visit (INDEPENDENT_AMBULATORY_CARE_PROVIDER_SITE_OTHER): Payer: Self-pay

## 2022-02-10 ENCOUNTER — Telehealth (INDEPENDENT_AMBULATORY_CARE_PROVIDER_SITE_OTHER): Payer: Self-pay

## 2022-02-10 VITALS — Ht 67.0 in | Wt 140.0 lb

## 2022-02-10 DIAGNOSIS — K219 Gastro-esophageal reflux disease without esophagitis: Secondary | ICD-10-CM

## 2022-02-10 DIAGNOSIS — R110 Nausea: Secondary | ICD-10-CM

## 2022-02-10 DIAGNOSIS — K5900 Constipation, unspecified: Secondary | ICD-10-CM

## 2022-02-10 DIAGNOSIS — R1319 Other dysphagia: Secondary | ICD-10-CM

## 2022-02-10 DIAGNOSIS — J301 Allergic rhinitis due to pollen: Secondary | ICD-10-CM | POA: Diagnosis not present

## 2022-02-10 DIAGNOSIS — J3081 Allergic rhinitis due to animal (cat) (dog) hair and dander: Secondary | ICD-10-CM | POA: Diagnosis not present

## 2022-02-10 DIAGNOSIS — J3089 Other allergic rhinitis: Secondary | ICD-10-CM | POA: Diagnosis not present

## 2022-02-10 MED ORDER — ESOMEPRAZOLE MAGNESIUM 40 MG PO CPDR: 40.0000 mg | DELAYED_RELEASE_CAPSULE | Freq: Two times a day (BID) | ORAL | 2 refills | Status: DC

## 2022-02-10 NOTE — Patient Instructions (Signed)
I have sent nexium '40mg'$  to your pharmacy Please take this 30 minutes prior to breakfast and again 30 minutes prior to dinner Avoid greasy, spicy, fried, citrus foods, and be mindful that caffeine, carbonated drinks, chocolate and alcohol can increase reflux symptoms Stay upright 2-3 hours after eating, prior to lying down and avoid eating late in the evenings.  We will get you scheduled for EGD for further evaluation of your nausea and issues with swallowing  As far as constipation, please make sure you are drinking plenty of water, eating a diet high in fruits, veggies and whole grains and can continue to use magnesium to help as well  Follow up in 3 months

## 2022-02-10 NOTE — Progress Notes (Signed)
Primary Care Physician:  [place]  Primary GI: Jenetta Downer  Patient Location: Home   Provider Location: [place] office   Reason for Visit: follow up of nausea   Persons present on the virtual encounter, with roles: Ramiyah Mcclenahan L. Alver Sorrow, MSN, APRN, AGNP-C, [facility], patient   Total time (minutes) spent on medical discussion: 15 minutes  Virtual Visit via telephone visit is conducted via telephone and was requested by patient.   I connected with Clover Mealy on 02/10/22 at 10:15 AM EDT by telephone and verified that I am speaking with the correct person using two identifiers.   I discussed the limitations, risks, security and privacy concerns of performing an evaluation and management service by telephone and the availability of in person appointments. I also discussed with the patient that there may be a patient responsible charge related to this service. The patient expressed understanding and agreed to proceed.  Chief Complaint  Patient presents with   Nausea    Patient here today due to nausea, and feels like she is going to vomit throughout the night. She states she had surgery in the past that prevents her from vomiting. She says she has been having these symptoms for the last two years.   History of Present Illness: Kathryn [name] is a 66 y.o. female presenting today with a history of anxiety, arthritis, dysphagia with previous esophageal dilations, s/p Nissen fundoplication (2119), GERD, IBS, adrenal insufficiency, RLS and GB.   History: Last seen 04/03/21, at that time having 2-3 episodes of reflux per week on pantoprazole, also endorsed some dysphagia 2-3x/week, worse with dryer, thicker foods. She had ongoing constipation, taking stool softeners 1-2x/week with some mucus in stools, could not tolerate miralax as she c/o diarrhea with it. Pantoprazole was stopped, started on omeprazole '40mg'$  daily with considerations to increase to BID dosing  after 3 months if no improvement. Also advised to try daily benefiber for constipation, increase water intake. Cbc done at that time was WNL. Notably patient had occurrence of diarrhea in august with findings of C diff, she was started on Vancomycin '125mg'$  q6h x14 days.   Present:  Patient reports that she has been having some nausea, mostly at night. This has been going on for the past 2 years. She does not have the ability to vomit due to previous Nissen Fundoplication. Does not seem to matter what she eats as far as her symptoms. She is waking up with burning in her chest, sometimes she can drink some water which will help. Burning in her chest is occurring multiple times per week, sometimes will wake her up from sleep. States that she is not on any PPI at this time as she tried to call for refills and did not get a response. Also reports she did not notice much improvement with omeprazole '40mg'$  daily. She denies any abdominal pain. She occasionally has some early satiety, though this does not occur often. She denies any melena. States that she has issues with foods feeling as though they do not want to go down as well, this occurs a few times per week.   Constipation is some better, she started taking some magnesium for cramping which has helped some with her BMs. Will usually have a small BM every day or every other day. She does not drink much water, endorses a lot of tea. She does eat plenty of fruits and veggies. Still having to strain some to have a BM. She did  use the benefiber but it caused more bloating and abdominal cramping.    Last Colonoscopy:(09/20/14) Prep was excellent except she had thick layer of stool coating cecal and ascending colon mucosa. Cecal landmarks were well identified after vigorous washing. Small lymphoid polyp ablated via cold biopsy from appendiceal stump.Mucosa of rest of the colon was normal. Normal mucosa of rectum and anorectal junction.   Last Endoscopy:(05/29/19) Normal  esophagus. - Z-line, 38 cm from the incisors. - 2 cm hiatal hernia. - No endoscopic esophageal abnormality to explain patient's dysphagia. Esophagus dilated. - A Nissen fundoplication was found. The wrap appears loose. - Normal duodenal bulb and second portion of the duodenum. - No specimens collected. esophageal manometry: (09/23/16) no evidence of esophageal peristaltic abnormality.   PH impedence study: (09/23/16) evidence of significant GERD  Past Medical History:  Diagnosis Date   Adrenal insufficiency ([place])    occured following steroid shoulder injections ~ spring 2017   Anxiety    pt. reports that she has RLS- takes Xanax for calming her self so she can settle & sleep    Arthritis    shoulder & back, knees & leg     Asthma    aleery related   Atypical mole 11/12/2020   mod-severe- mid back   Chest pain, atypical    had cardiac workup - deemed related to anxiety   Chronic bronchitis ([place])    "less since I quit smoking" (02/21/2016)   Chronic lower back pain    Dyspnea    related to spray paint that is put in  beer cans at her place of employment.  Pt. also reports that she has anxiety also that leads to SOB., Pt. reports her Mother just died a week ago     GERD (gastroesophageal reflux disease)    Guillain-Barre syndrome following vaccination ([place]) 1970's   post swine flu shot, hosp. for treatment for 1 month    H/O dizziness    H/O hiatal hernia    Hepatitis    hepatitis as a child; "got it from a little girl at school" make sick and vomiting   Herpes simplex    History of Guillain-Barre syndrome ~ 1978   "from the swine flu shot"   IBS (irritable bowel syndrome)    Pneumonia    PONV (postoperative nausea and vomiting)    Restless leg syndrome    Walking pneumonia ~ 2011     Past Surgical History:  Procedure Laterality Date   63 HOUR [place] STUDY N/A 09/23/2016   Procedure: 24 HOUR [place] STUDY;  Surgeon: Mauri Pole, MD;  [facility];  Service:  Endoscopy;  Laterality: N/A;   ANTERIOR CERVICAL DECOMP/DISCECTOMY FUSION  2000 & 2015   two times ago- C5 and C6, Dr. Otilio Connors, MD 2015   APPENDECTOMY  1980s   BACK SURGERY     CHOLECYSTECTOMY OPEN  1980s   COLONOSCOPY N/A 09/20/2014   rehman: Prep was excellent except she had thick layer of stool coating cecal and ascending colon mucosa. Cecal landmarks were well identified after vigorous washing. Small lymphoid polyp ablated via cold biopsy from appendiceal stump.Mucosa of rest of the colon was normal. Normal mucosa of rectum and anorectal junction.   ESOPHAGEAL DILATION N/A 05/29/2019   Procedure: ESOPHAGEAL DILATION;  Surgeon: Rogene Houston, MD;  [facility];  Service: Endoscopy;  Laterality: N/A;   ESOPHAGEAL MANOMETRY N/A 09/23/2016   Procedure: ESOPHAGEAL MANOMETRY (EM);  Surgeon: Mauri Pole, MD;  [facility];  Service: Endoscopy;  Laterality: N/A;   ESOPHAGOGASTRODUODENOSCOPY N/A 09/20/2014   Procedure: ESOPHAGOGASTRODUODENOSCOPY (EGD);  Surgeon: Rogene Houston, MD;  [facility];  Service: Endoscopy;  Laterality: N/A;   ESOPHAGOGASTRODUODENOSCOPY (EGD) WITH PROPOFOL N/A 05/29/2019   - Z-line, 38 cm from the incisors.   HIATAL HERNIA REPAIR N/A 11/27/2016   Procedure: LAPAROSCOPIC REPAIR OF HIATAL HERNIA WITH NISSEN FUNDOPLICATION;  Surgeon: Jackolyn Confer, MD;  [facility];  Service: General;  Laterality: N/A;   INCISION AND DRAINAGE OF WOUND Left 02/21/2016   forearm, minor   LUMBAR LAMINECTOMY/DECOMPRESSION MICRODISCECTOMY Right 06/22/2013   Procedure: RIGHT LUMBAR TWO-THREE DISCECTOMY;  Surgeon: Otilio Connors, MD;  Location: [place] [facility];  Service: Neurosurgery;  Laterality: Right;  Right    MALONEY DILATION N/A 09/20/2014   Procedure: Venia Minks DILATION;  Surgeon: Rogene Houston, MD;  [facility];  Service: Endoscopy;  Laterality: N/A;   MINOR IRRIGATION AND DEBRIDEMENT OF WOUND Left 02/21/2016   Procedure:  MINOR IRRIGATION AND DEBRIDEMENT/REPAIR OF LEFT ARM WOUND;  Surgeon: Netta Cedars, MD;  Location: [place];  Service: Orthopedics;  Laterality: Left;   REVERSE SHOULDER ARTHROPLASTY Right 02/21/2016   Procedure: RIGHT REVERSE TOTAL SHOULDER ARTHROPLASTY;  Surgeon: Netta Cedars, MD;  Location: [place];  Service: Orthopedics;  Laterality: Right;   REVERSE TOTAL SHOULDER ARTHROPLASTY Right 02/21/2016   SHOULDER ARTHROSCOPY W/ ROTATOR CUFF REPAIR Right 1990s   TOTAL SHOULDER REPLACEMENT     Right   VAGINAL HYSTERECTOMY       Current Meds  Medication Sig   acetaminophen (TYLENOL) 500 MG tablet Take 500-1,000 mg by mouth every 6 (six) hours as needed (For pain.).   albuterol (PROVENTIL HFA;VENTOLIN HFA) 108 (90 Base) MCG/ACT inhaler Inhale 2 puffs into the lungs every 4 (four) hours as needed for wheezing or shortness of breath.   ALPRAZolam (XANAX) 0.5 MG tablet Take 0.5 mg by mouth as needed for anxiety.   Biotin 10000 MCG TABS Take 10,000 mcg by mouth daily.    Cholecalciferol (VITAMIN D-3) 125 MCG (5000 UT) TABS Take 5,000 Units by mouth daily.    dicyclomine (BENTYL) 10 MG capsule Take 1 capsule (10 mg total) by mouth 2 (two) times daily as needed (irritable bowel syndrome).   docusate sodium (COLACE) 50 MG capsule Take 50 mg by mouth 2 (two) times daily. prn   escitalopram (LEXAPRO) 20 MG tablet Take 20 mg by mouth daily.   fluticasone (FLONASE) 50 MCG/ACT nasal spray Place 2 sprays into both nostrils daily.    ibuprofen (ADVIL) 200 MG tablet Take 400-600 mg by mouth every 6 (six) hours as needed for moderate pain.   levothyroxine (SYNTHROID) 25 MCG tablet Take 25 mcg by mouth daily before breakfast.   loratadine (CLARITIN) 10 MG tablet Take 10 mg by mouth daily.   meclizine (ANTIVERT) 25 MG tablet Take 25 mg by mouth 3 (three) times daily as needed for dizziness. Patient not sure of mg   montelukast (SINGULAIR) 10 MG tablet Take 10 mg by mouth every morning.    traMADol (ULTRAM) 50 MG tablet  Take 50-100 mg by mouth every 12 (twelve) hours as needed for severe pain.    valACYclovir (VALTREX) 1000 MG tablet Take 1,000 mg by mouth daily.     Family History  Problem Relation Age of Onset   Congestive Heart Failure Mother    Atrial fibrillation Mother    Hiatal hernia Mother    Cancer Father  lung   Heart attack Brother    Stroke Paternal Grandfather    Diabetes Paternal Grandfather    Alzheimer's disease Paternal Grandmother     Social History   Socioeconomic History   Marital status: Married    Spouse name: Not on file   Number of children: Not on file   Years of education: Not on file   Highest education level: Not on file  Occupational History   Occupation: Therapist, sports  Tobacco Use   Smoking status: Former    Packs/day: 3.00    Years: 10.00    Pack years: 30.00    Types: Cigarettes    Quit date: 05/09/1999    Years since quitting: 22.7   Smokeless tobacco: Never   Tobacco comments:    "quit smoking in ~ 2003"  Vaping Use   Vaping Use: Never used  Substance and Sexual Activity   Alcohol use: No    Comment: 02/21/2016 "I used to drink a little bit; probably quit in my 20s"   Drug use: No   Sexual activity: Yes    Birth control/protection: Surgical    Comment: hyst  Other Topics Concern   Not on file  Social History Narrative   Walks but no other activity    Anxiety   Social Determinants of Health   Financial Resource Strain: Not on file  Food Insecurity: Not on file  Transportation Needs: Not on file  Physical Activity: Not on file  Stress: Not on file  Social Connections: Not on file   Review of Systems: Gen: Denies fever, chills, anorexia. Denies fatigue, weakness, weight loss.  CV: Denies chest pain, palpitations, syncope, peripheral edema, and claudication. Resp: Denies dyspnea at rest, cough, wheezing, coughing up blood, and pleurisy. GI: see HPI Derm: Denies rash, itching, dry skin Psych: Denies depression, anxiety, memory loss,  confusion. No homicidal or suicidal ideation.  Heme: Denies bruising, bleeding, and enlarged lymph nodes.  Observations/Objective: No distress. Unable to perform physical exam due to telephone encounter. No video available.   Assessment : Lakesia Dahle. Kathryn [name] is a 66 year old female who presents today for follow up of GERD and constipation with c/o nausea.  GERD is not well managed, she is not currently on PPI therapy as she ran out and states she was unable to get in touch with [place] regarding refills. She also reports that omeprazole '40mg'$  daily she was previously on did not seem to provide much relief. Will Rx Nexium '40mg'$  BID. Discussed importance of reflux precautions to include Avoiding greasy, spicy, fried, citrus foods, caffeine, carbonated drinks, chocolate and alcohol as well as Staying upright 2-3 hours after eating, prior to lying down and avoid eating late in the evenings. She reports ongoing nausea, suspect this could be secondary to uncontrolled GERD, however, cannot rule out PUD, gastritis, duodenitis. She is also having some dysphagia occurring a few times per week. We will schedule EGD for further evaluation of her symptoms. Indications, risks and benefits of procedure discussed in detail with patient. Patient verbalized understanding and is in agreement to proceed with EGD at this time.   Constipation seems somewhat improved with the use of magnesium. I encouraged her to continue diet high in fruits, veggies and whole grains and urged her to increase water intake as she does not drink much water. Did not tolerate benefiber or miralax in the past and feels that magnesium has provided some results for her. Can continue this with good results.   Plan: -schedule EGD  -  start esomeprazole '40mg'$  BID -reflux precautions -stay well hydrated, diet high in fruits, veggies and whole grains -continue magnesium for constipation   All questions were answered, patient verbalized understanding and is in  agreement with plan as outlined above.   Follow Up Instructions: Follow up in 3 months   I discussed the assessment and treatment plan with the patient. The patient was provided an opportunity to ask questions and all were answered. The patient agreed with the plan and demonstrated an understanding of the instructions.   The patient was advised to call back or seek an in-person evaluation if the symptoms worsen or if the condition fails to improve as anticipated.  I provided 15 minutes of non face-to-face time during this telephone encounter.  Viviana Trimble L. Alver Sorrow, MSN, APRN, AGNP-C Adult-Gerontology Nurse Practitioner [facility]

## 2022-02-11 ENCOUNTER — Encounter (INDEPENDENT_AMBULATORY_CARE_PROVIDER_SITE_OTHER): Payer: Self-pay

## 2022-02-17 DIAGNOSIS — J3089 Other allergic rhinitis: Secondary | ICD-10-CM | POA: Diagnosis not present

## 2022-02-17 DIAGNOSIS — J301 Allergic rhinitis due to pollen: Secondary | ICD-10-CM | POA: Diagnosis not present

## 2022-02-19 DIAGNOSIS — E2749 Other adrenocortical insufficiency: Secondary | ICD-10-CM | POA: Diagnosis not present

## 2022-02-19 DIAGNOSIS — E039 Hypothyroidism, unspecified: Secondary | ICD-10-CM | POA: Diagnosis not present

## 2022-02-25 DIAGNOSIS — J301 Allergic rhinitis due to pollen: Secondary | ICD-10-CM | POA: Diagnosis not present

## 2022-02-25 DIAGNOSIS — E039 Hypothyroidism, unspecified: Secondary | ICD-10-CM | POA: Diagnosis not present

## 2022-02-25 DIAGNOSIS — E2749 Other adrenocortical insufficiency: Secondary | ICD-10-CM | POA: Diagnosis not present

## 2022-02-25 DIAGNOSIS — J3089 Other allergic rhinitis: Secondary | ICD-10-CM | POA: Diagnosis not present

## 2022-03-03 DIAGNOSIS — J3081 Allergic rhinitis due to animal (cat) (dog) hair and dander: Secondary | ICD-10-CM | POA: Diagnosis not present

## 2022-03-03 DIAGNOSIS — J3089 Other allergic rhinitis: Secondary | ICD-10-CM | POA: Diagnosis not present

## 2022-03-03 DIAGNOSIS — J301 Allergic rhinitis due to pollen: Secondary | ICD-10-CM | POA: Diagnosis not present

## 2022-03-13 DIAGNOSIS — J3081 Allergic rhinitis due to animal (cat) (dog) hair and dander: Secondary | ICD-10-CM | POA: Diagnosis not present

## 2022-03-13 DIAGNOSIS — J301 Allergic rhinitis due to pollen: Secondary | ICD-10-CM | POA: Diagnosis not present

## 2022-03-13 DIAGNOSIS — J3089 Other allergic rhinitis: Secondary | ICD-10-CM | POA: Diagnosis not present

## 2022-03-18 DIAGNOSIS — J301 Allergic rhinitis due to pollen: Secondary | ICD-10-CM | POA: Diagnosis not present

## 2022-03-18 DIAGNOSIS — J3089 Other allergic rhinitis: Secondary | ICD-10-CM | POA: Diagnosis not present

## 2022-03-19 ENCOUNTER — Encounter (HOSPITAL_COMMUNITY): Payer: Self-pay

## 2022-03-19 ENCOUNTER — Encounter (HOSPITAL_COMMUNITY)
Admission: RE | Admit: 2022-03-19 | Discharge: 2022-03-19 | Disposition: A | Discharge status: Home / Self Care | Payer: PPO | Source: Ambulatory Visit | Attending: Gastroenterology | Admitting: Gastroenterology

## 2022-03-24 ENCOUNTER — Ambulatory Visit (HOSPITAL_COMMUNITY)
Admission: RE | Admit: 2022-03-24 | Discharge: 2022-03-24 | Disposition: A | Discharge status: Home / Self Care | Payer: PPO | Source: Ambulatory Visit | Attending: Gastroenterology | Admitting: Gastroenterology

## 2022-03-24 ENCOUNTER — Encounter (INDEPENDENT_AMBULATORY_CARE_PROVIDER_SITE_OTHER): Payer: Self-pay | Admitting: *Deleted

## 2022-03-24 ENCOUNTER — Encounter (HOSPITAL_COMMUNITY)
Admission: RE | Disposition: A | Discharge status: Home / Self Care | Payer: Self-pay | Source: Ambulatory Visit | Attending: Gastroenterology

## 2022-03-24 ENCOUNTER — Encounter (HOSPITAL_COMMUNITY): Payer: Self-pay | Admitting: Gastroenterology

## 2022-03-24 ENCOUNTER — Ambulatory Visit (HOSPITAL_COMMUNITY): Payer: PPO | Admitting: Anesthesiology

## 2022-03-24 ENCOUNTER — Ambulatory Visit (HOSPITAL_BASED_OUTPATIENT_CLINIC_OR_DEPARTMENT_OTHER): Payer: PPO | Admitting: Anesthesiology

## 2022-03-24 DIAGNOSIS — K449 Diaphragmatic hernia without obstruction or gangrene: Secondary | ICD-10-CM | POA: Insufficient documentation

## 2022-03-24 DIAGNOSIS — R1310 Dysphagia, unspecified: Secondary | ICD-10-CM | POA: Diagnosis not present

## 2022-03-24 DIAGNOSIS — R112 Nausea with vomiting, unspecified: Secondary | ICD-10-CM | POA: Diagnosis not present

## 2022-03-24 DIAGNOSIS — Z9889 Other specified postprocedural states: Secondary | ICD-10-CM | POA: Insufficient documentation

## 2022-03-24 DIAGNOSIS — R1319 Other dysphagia: Secondary | ICD-10-CM

## 2022-03-24 DIAGNOSIS — K759 Inflammatory liver disease, unspecified: Secondary | ICD-10-CM | POA: Diagnosis not present

## 2022-03-24 DIAGNOSIS — M1990 Unspecified osteoarthritis, unspecified site: Secondary | ICD-10-CM | POA: Insufficient documentation

## 2022-03-24 DIAGNOSIS — J45909 Unspecified asthma, uncomplicated: Secondary | ICD-10-CM | POA: Insufficient documentation

## 2022-03-24 DIAGNOSIS — R1110 Vomiting, unspecified: Secondary | ICD-10-CM

## 2022-03-24 DIAGNOSIS — J3081 Allergic rhinitis due to animal (cat) (dog) hair and dander: Secondary | ICD-10-CM | POA: Diagnosis not present

## 2022-03-24 DIAGNOSIS — Z79899 Other long term (current) drug therapy: Secondary | ICD-10-CM | POA: Diagnosis not present

## 2022-03-24 DIAGNOSIS — F419 Anxiety disorder, unspecified: Secondary | ICD-10-CM | POA: Diagnosis not present

## 2022-03-24 DIAGNOSIS — K219 Gastro-esophageal reflux disease without esophagitis: Secondary | ICD-10-CM | POA: Diagnosis not present

## 2022-03-24 DIAGNOSIS — Z87891 Personal history of nicotine dependence: Secondary | ICD-10-CM | POA: Insufficient documentation

## 2022-03-24 DIAGNOSIS — J3089 Other allergic rhinitis: Secondary | ICD-10-CM | POA: Diagnosis not present

## 2022-03-24 DIAGNOSIS — J301 Allergic rhinitis due to pollen: Secondary | ICD-10-CM | POA: Diagnosis not present

## 2022-03-24 DIAGNOSIS — R110 Nausea: Secondary | ICD-10-CM

## 2022-03-24 HISTORY — PX: SAVORY DILATION: SHX5439

## 2022-03-24 HISTORY — PX: BIOPSY: SHX5522

## 2022-03-24 HISTORY — PX: ESOPHAGOGASTRODUODENOSCOPY (EGD) WITH PROPOFOL: SHX5813

## 2022-03-24 SURGERY — ESOPHAGOGASTRODUODENOSCOPY (EGD) WITH PROPOFOL
Anesthesia: General

## 2022-03-24 MED ORDER — MIDAZOLAM HCL 2 MG/2ML IJ SOLN
2.0000 mg | Freq: Once | INTRAMUSCULAR | Status: AC | PRN
Start: 2022-03-24 — End: 2022-03-24
  Administered 2022-03-24: 2 mg via INTRAVENOUS
  Filled 2022-03-24: qty 2

## 2022-03-24 MED ORDER — LACTATED RINGERS IV SOLN: INTRAVENOUS | Status: DC

## 2022-03-24 MED ORDER — PROPOFOL 10 MG/ML IV BOLUS
INTRAVENOUS | Status: DC | PRN
  Administered 2022-03-24 (×4): 50 mg via INTRAVENOUS

## 2022-03-24 MED ORDER — LIDOCAINE HCL 1 % IJ SOLN
INTRAMUSCULAR | Status: DC | PRN
  Administered 2022-03-24: 50 mg via INTRADERMAL

## 2022-03-24 MED ORDER — ONDANSETRON HCL 4 MG PO TABS: 4.0000 mg | ORAL_TABLET | Freq: Three times a day (TID) | ORAL | 2 refills | Status: DC | PRN

## 2022-03-24 NOTE — H&P (Signed)
Kathryn [name] is an 66 y.o. female.   Chief Complaint: Dysphagia and regurgitation, chronic nausea HPI: 66 year old female with past medical history of adrenal insufficiency, anxiety, asthma, chronic bronchitis, GERD, hiatal hernia, Gilliam Barr syndrome, restless leg syndrome, IBS, who comes to the hospital for evaluation of chronic nausea, dysphagia and regurgitation.  Patient reported that for the last 2 years she has presented frequent nausea despite taking PPI twice daily.  Also endorses chronic regurgitation episodes and occasional dysphagia.  She feels like "her esophagus gets full with liquids" and the contents regurgitated.  Had extensive investigations in the past including pH impedance, esophageal manometry, endoscopy that did not show any major abnormalities.  Past Medical History:  Diagnosis Date   Adrenal insufficiency ([place])    occured following steroid shoulder injections ~ spring 2017   Anxiety    pt. reports that she has RLS- takes Xanax for calming her self so she can settle & sleep    Arthritis    shoulder & back, knees & leg     Asthma    aleery related   Atypical mole 11/12/2020   mod-severe- mid back   Chest pain, atypical    had cardiac workup - deemed related to anxiety   Chronic bronchitis ([place])    "less since I quit smoking" (02/21/2016)   Chronic lower back pain    Dyspnea    related to spray paint that is put in  beer cans at her place of employment.  Pt. also reports that she has anxiety also that leads to SOB., Pt. reports her Mother just died a week ago     GERD (gastroesophageal reflux disease)    Guillain-Barre syndrome following vaccination ([place]) 1970's   post swine flu shot, hosp. for treatment for 1 month    H/O dizziness    H/O hiatal hernia    Hepatitis    hepatitis as a child; "got it from a little girl at school" make sick and vomiting   Herpes simplex    History of Guillain-Barre syndrome ~ 1978   "from the swine flu shot"   IBS (irritable  bowel syndrome)    Pneumonia    PONV (postoperative nausea and vomiting)    Restless leg syndrome    Walking pneumonia ~ 2011    Past Surgical History:  Procedure Laterality Date   60 HOUR [place] STUDY N/A 09/23/2016   Procedure: 24 HOUR [place] STUDY;  Surgeon: Mauri Pole, MD;  [facility];  Service: Endoscopy;  Laterality: N/A;   ANTERIOR CERVICAL DECOMP/DISCECTOMY FUSION  2000 & 2015   two times ago- C5 and C6, Dr. Otilio Connors, MD 2015   APPENDECTOMY  1980s   BACK SURGERY     CHOLECYSTECTOMY OPEN  1980s   COLONOSCOPY N/A 09/20/2014   rehman: Prep was excellent except she had thick layer of stool coating cecal and ascending colon mucosa. Cecal landmarks were well identified after vigorous washing. Small lymphoid polyp ablated via cold biopsy from appendiceal stump.Mucosa of rest of the colon was normal. Normal mucosa of rectum and anorectal junction.   ESOPHAGEAL DILATION N/A 05/29/2019   Procedure: ESOPHAGEAL DILATION;  Surgeon: Rogene Houston, MD;  [facility];  Service: Endoscopy;  Laterality: N/A;   ESOPHAGEAL MANOMETRY N/A 09/23/2016   Procedure: ESOPHAGEAL MANOMETRY (EM);  Surgeon: Mauri Pole, MD;  [facility];  Service: Endoscopy;  Laterality: N/A;   ESOPHAGOGASTRODUODENOSCOPY N/A 09/20/2014   Procedure: ESOPHAGOGASTRODUODENOSCOPY (EGD);  Surgeon: Mechele Dawley  Laural Golden, MD;  [facility];  Service: Endoscopy;  Laterality: N/A;   ESOPHAGOGASTRODUODENOSCOPY (EGD) WITH PROPOFOL N/A 05/29/2019   - Z-line, 38 cm from the incisors.   HIATAL HERNIA REPAIR N/A 11/27/2016   Procedure: LAPAROSCOPIC REPAIR OF HIATAL HERNIA WITH NISSEN FUNDOPLICATION;  Surgeon: Jackolyn Confer, MD;  [facility];  Service: General;  Laterality: N/A;   INCISION AND DRAINAGE OF WOUND Left 02/21/2016   forearm, minor   LUMBAR LAMINECTOMY/DECOMPRESSION MICRODISCECTOMY Right 06/22/2013   Procedure: RIGHT LUMBAR TWO-THREE DISCECTOMY;  Surgeon: Otilio Connors, MD;  Location: [place] [facility];  Service: Neurosurgery;  Laterality: Right;  Right    MALONEY DILATION N/A 09/20/2014   Procedure: Venia Minks DILATION;  Surgeon: Rogene Houston, MD;  [facility];  Service: Endoscopy;  Laterality: N/A;   MINOR IRRIGATION AND DEBRIDEMENT OF WOUND Left 02/21/2016   Procedure: MINOR IRRIGATION AND DEBRIDEMENT/REPAIR OF LEFT ARM WOUND;  Surgeon: Netta Cedars, MD;  Location: [place];  Service: Orthopedics;  Laterality: Left;   REVERSE SHOULDER ARTHROPLASTY Right 02/21/2016   Procedure: RIGHT REVERSE TOTAL SHOULDER ARTHROPLASTY;  Surgeon: Netta Cedars, MD;  Location: [place];  Service: Orthopedics;  Laterality: Right;   REVERSE TOTAL SHOULDER ARTHROPLASTY Right 02/21/2016   SHOULDER ARTHROSCOPY W/ ROTATOR CUFF REPAIR Right 1990s   TOTAL SHOULDER REPLACEMENT     Right   VAGINAL HYSTERECTOMY      Family History  Problem Relation Age of Onset   Congestive Heart Failure Mother    Atrial fibrillation Mother    Hiatal hernia Mother    Cancer Father        lung   Heart attack Brother    Stroke Paternal Grandfather    Diabetes Paternal Grandfather    Alzheimer's disease Paternal Grandmother    Social History:  reports that she quit smoking about 22 years ago. Her smoking use included cigarettes. She has a 30.00 pack-year smoking history. She has never used smokeless tobacco. She reports that she does not drink alcohol and does not use drugs.  Allergies:  Allergies  Allergen Reactions   Niacin And Related Shortness Of Breath, Swelling and Other (See Comments)    Neck; skin hot   Codeine Swelling and Other (See Comments)    tongue   Zinc Swelling    Medications Prior to Admission  Medication Sig Dispense Refill   acetaminophen (TYLENOL) 500 MG tablet Take 500-1,000 mg by mouth every 6 (six) hours as needed (For pain.).     ALPRAZolam (XANAX) 0.5 MG tablet Take 0.5 mg by mouth daily as needed for anxiety.     Biotin 10000 MCG TABS Take 10,000 mcg  by mouth daily.      Calcium Carbonate-Vitamin D (CALTRATE 600+D PO) Take 1 tablet by mouth daily.     Cholecalciferol (VITAMIN D-3) 125 MCG (5000 UT) TABS Take 5,000 Units by mouth daily.      docusate sodium (COLACE) 50 MG capsule Take 50 mg by mouth daily as needed for mild constipation or moderate constipation.     EPINEPHrine 0.3 mg/0.3 mL IJ SOAJ injection Inject 0.3 mg into the skin as needed for anaphylaxis.     escitalopram (LEXAPRO) 20 MG tablet Take 20 mg by mouth daily.     esomeprazole (NEXIUM) 40 MG capsule Take 1 capsule (40 mg total) by mouth 2 (two) times daily before a meal. 60 capsule 2   fluticasone (FLONASE) 50 MCG/ACT nasal spray Place 2 sprays into both nostrils daily as needed for  allergies or rhinitis.     ibuprofen (ADVIL) 200 MG tablet Take 800 mg by mouth every 6 (six) hours as needed for moderate pain.     levothyroxine (SYNTHROID) 25 MCG tablet Take 25 mcg by mouth daily before breakfast.     loratadine (CLARITIN) 10 MG tablet Take 10 mg by mouth daily.     MAGNESIUM PO Take 1 tablet by mouth at bedtime.     meclizine (ANTIVERT) 25 MG tablet Take 25 mg by mouth 3 (three) times daily as needed for dizziness. Patient not sure of mg     montelukast (SINGULAIR) 10 MG tablet Take 10 mg by mouth every morning.      rOPINIRole (REQUIP) 2 MG tablet Take 2 mg by mouth at bedtime.     traMADol (ULTRAM) 50 MG tablet Take 50-100 mg by mouth every 12 (twelve) hours as needed for severe pain.      valACYclovir (VALTREX) 1000 MG tablet Take 1,000 mg by mouth daily.     albuterol (PROVENTIL HFA;VENTOLIN HFA) 108 (90 Base) MCG/ACT inhaler Inhale 2 puffs into the lungs every 4 (four) hours as needed for wheezing or shortness of breath.     celecoxib (CELEBREX) 200 MG capsule Take 200 mg by mouth 2 (two) times daily as needed for moderate pain or mild pain (Back. neck and shoulder).     dicyclomine (BENTYL) 10 MG capsule Take 1 capsule (10 mg total) by mouth 2 (two) times daily as  needed (irritable bowel syndrome). 30 capsule 0    No results found for this or any previous visit (from the past 48 hour(s)). No results found.  Review of Systems  Constitutional: Negative.   HENT:  Positive for trouble swallowing.   Eyes: Negative.   Respiratory: Negative.    Cardiovascular: Negative.   Gastrointestinal: Negative.   Endocrine: Negative.   Genitourinary: Negative.   Musculoskeletal: Negative.   Skin: Negative.   Allergic/Immunologic: Negative.   Neurological: Negative.   Hematological: Negative.   Psychiatric/Behavioral: Negative.      Blood pressure 124/64, pulse 60, temperature 98.2 F (36.8 C), temperature source Oral, resp. rate 18, SpO2 98 %. Physical Exam  GENERAL: The patient is AO x3, in no acute distress. HEENT: Head is normocephalic and atraumatic. EOMI are intact. Mouth is well hydrated and without lesions. NECK: Supple. No masses LUNGS: Clear to auscultation. No presence of rhonchi/wheezing/rales. Adequate chest expansion HEART: RRR, normal s1 and s2. ABDOMEN: Soft, nontender, no guarding, no peritoneal signs, and nondistended. BS +. No masses. EXTREMITIES: Without any cyanosis, clubbing, rash, lesions or edema. NEUROLOGIC: AOx3, no focal motor deficit. SKIN: no jaundice, no rashes  Assessment/Plan 66 year old female with past medical history of adrenal insufficiency, anxiety, asthma, chronic bronchitis, GERD, hiatal hernia, Gilliam Barr syndrome, restless leg syndrome, IBS, who comes to the hospital for evaluation of chronic nausea, dysphagia and regurgitation.  We will proceed with EGD.  Harvel Quale, MD 03/24/2022, 7:30 AM

## 2022-03-24 NOTE — Transfer of Care (Signed)
Immediate Anesthesia Transfer of Care Note  Patient: Kathryn [name]  Procedure(s) Performed: ESOPHAGOGASTRODUODENOSCOPY (EGD) WITH PROPOFOL BIOPSY SAVORY DILATION  Patient Location: Short Stay  Anesthesia Type:General  Level of Consciousness: awake  Airway & Oxygen Therapy: Patient Spontanous Breathing  Post-op Assessment: Report given to RN  Post vital signs: Reviewed and stable  Last Vitals:  Vitals Value Taken Time  BP    Temp    Pulse    Resp    SpO2      Last Pain:  Vitals:   03/24/22 0739  TempSrc:   PainSc: 0-No pain      Patients Stated Pain Goal: 6 (92/95/74 7340)  Complications: No notable events documented.

## 2022-03-24 NOTE — Op Note (Signed)
[facility] Patient Name: Kathryn [name] Procedure Date: 03/24/2022 7:27 AM MRN: [id number] Date of Birth: [birth year] Attending MD: Maylon Peppers ,  CSN: [id number] Age: 66 Admit Type: Outpatient Procedure:                Upper GI endoscopy Indications:              Dysphagia, Nausea, Regurgitation Providers:                Maylon Peppers, Lambert Mody, Everardo Pacific, Aram Candela Referring MD:              Medicines:                Monitored Anesthesia Care Complications:            No immediate complications. Estimated Blood Loss:     Estimated blood loss: none. Procedure:                Pre-Anesthesia Assessment:                           - Prior to the procedure, a History and Physical                            was performed, and patient medications, allergies                            and sensitivities were reviewed. The patient's                            tolerance of previous anesthesia was reviewed.                           - The risks and benefits of the procedure and the                            sedation options and risks were discussed with the                            patient. All questions were answered and informed                            consent was obtained.                           - ASA Grade Assessment: II - A patient with mild                            systemic disease.                           After obtaining informed consent, the endoscope was                            passed under direct vision. Throughout the  procedure, the patient's blood pressure, pulse, and                            oxygen saturations were monitored continuously. The                            GIF-H190 ([id number]) scope was introduced through the                            mouth, and advanced to the second part of duodenum.                            The upper GI endoscopy was accomplished without                             difficulty. The patient tolerated the procedure                            well. Scope In: 7:42:39 AM Scope Out: 7:52:54 AM Total Procedure Duration: 0 hours 10 minutes 15 seconds  Findings:      No endoscopic abnormality was evident in the esophagus to explain the       patient's complaint of dysphagia. A guidewire was placed and the scope       was withdrawn. Dilation was performed with a Savary dilator with mild       resistance at 18 mm. No mucosal disruption was seen upon reinspection.       Biopsies were obtained from the proximal and distal esophagus with cold       forceps for histology of suspected eosinophilic esophagitis.      A 1 cm hiatal hernia was present.      A prior Nissen fundoplication was found at the gastroesophageal       junction. The wrap appeared to be loose.      The stomach was normal.      The examined duodenum was normal. Impression:               - No endoscopic esophageal abnormality to explain                            patient's dysphagia. Dilated. Biopsied.                           - 1 cm hiatal hernia.                           - A Nissen fundoplication was found. Loose wrap.                           - Normal stomach.                           - Normal examined duodenum. Moderate Sedation:      Per Anesthesia Care Recommendation:           - Discharge patient to home (ambulatory).                           -  Resume previous diet.                           - Await pathology results.                           - Start Zofran 4 mg every 8 h as needed for nausea.                           - Will request pH impedace testing to evaluate if                            there was presence of non acidic regurgitation. Procedure Code(s):        --- Professional ---                           [phone], Esophagogastroduodenoscopy, flexible,                            transoral; with insertion of guide wire followed by                             passage of dilator(s) through esophagus over guide                            wire                           43239, 27, Esophagogastroduodenoscopy, flexible,                            transoral; with biopsy, single or multiple Diagnosis Code(s):        --- Professional ---                           R13.10, Dysphagia, unspecified                           K44.9, Diaphragmatic hernia without obstruction or                            gangrene                           Z98.890, Other specified postprocedural states                           R11.0, Nausea                           R11.10, Vomiting, unspecified CPT copyright 2019 [employer]. All rights reserved. The codes documented in this report are preliminary and upon coder review may  be revised to meet current compliance requirements. Maylon Peppers, MD Maylon Peppers,  03/24/2022 8:02:57 AM This report has been signed electronically. Number of Addenda: 0

## 2022-03-24 NOTE — Anesthesia Postprocedure Evaluation (Signed)
Anesthesia Post Note  Patient: Kathryn [name]  Procedure(s) Performed: ESOPHAGOGASTRODUODENOSCOPY (EGD) WITH PROPOFOL BIOPSY SAVORY DILATION  Patient location during evaluation: Short Stay Anesthesia Type: General Level of consciousness: awake and alert Pain management: pain level controlled Vital Signs Assessment: post-procedure vital signs reviewed and stable Respiratory status: spontaneous breathing Cardiovascular status: blood pressure returned to baseline and stable Postop Assessment: no apparent nausea or vomiting Anesthetic complications: no   No notable events documented.   Last Vitals:  Vitals:   03/24/22 0642 03/24/22 0800  BP: 124/64 (!) 101/51  Pulse: 60 70  Resp: 18 19  Temp: 36.8 C (!) 36.3 C  SpO2: 98% 97%    Last Pain:  Vitals:   03/24/22 0800  TempSrc: Oral  PainSc: 0-No pain                 Riel Hirschman

## 2022-03-24 NOTE — Discharge Instructions (Signed)
You are being discharged to home.  Resume your previous diet.  We are waiting for your pathology results. Start Zofran 4 mg every 8 h as needed for nausea.

## 2022-03-24 NOTE — Anesthesia Preprocedure Evaluation (Signed)
Anesthesia Evaluation  Patient identified by MRN, date of birth, ID band Patient awake    Reviewed: Allergy & Precautions, NPO status , Patient's Chart, lab work & pertinent test results  History of Anesthesia Complications (+) PONV and history of anesthetic complications  Airway Mallampati: II  TM Distance: >3 FB Neck ROM: Full   Comment: ACDF Dental  (+) Dental Advisory Given, Missing, Caps   Pulmonary shortness of breath and with exertion, asthma , pneumonia, former smoker,    Pulmonary exam normal breath sounds clear to auscultation       Cardiovascular negative cardio ROS Normal cardiovascular exam Rhythm:Regular Rate:Normal     Neuro/Psych PSYCHIATRIC DISORDERS Anxiety  Neuromuscular disease (GBS)    GI/Hepatic hiatal hernia, GERD  Medicated and Poorly Controlled,(+) Hepatitis -  Endo/Other  negative endocrine ROS  Renal/GU negative Renal ROS  negative genitourinary   Musculoskeletal  (+) Arthritis , Osteoarthritis,  Chronic back pain ACDF   Abdominal   Peds negative pediatric ROS (+)  Hematology negative hematology ROS (+)   Anesthesia Other Findings   Reproductive/Obstetrics negative OB ROS                            Anesthesia Physical Anesthesia Plan  ASA: 2  Anesthesia Plan: General   Post-op Pain Management: Minimal or no pain anticipated   Induction: Intravenous  PONV Risk Score and Plan: Propofol infusion  Airway Management Planned: Nasal Cannula and Natural Airway  Additional Equipment:   Intra-op Plan:   Post-operative Plan:   Informed Consent: I have reviewed the patients History and Physical, chart, labs and discussed the procedure including the risks, benefits and alternatives for the proposed anesthesia with the patient or authorized representative who has indicated his/her understanding and acceptance.     Dental advisory given  Plan Discussed  with: CRNA and Surgeon  Anesthesia Plan Comments: (Possible GA with ETT was explained.)       Anesthesia Quick Evaluation

## 2022-03-25 LAB — SURGICAL PATHOLOGY

## 2022-03-30 ENCOUNTER — Encounter (HOSPITAL_COMMUNITY): Payer: Self-pay | Admitting: Gastroenterology

## 2022-04-09 ENCOUNTER — Ambulatory Visit (INDEPENDENT_AMBULATORY_CARE_PROVIDER_SITE_OTHER): Payer: PPO | Admitting: Gastroenterology

## 2022-04-10 DIAGNOSIS — J3089 Other allergic rhinitis: Secondary | ICD-10-CM | POA: Diagnosis not present

## 2022-04-10 DIAGNOSIS — J3081 Allergic rhinitis due to animal (cat) (dog) hair and dander: Secondary | ICD-10-CM | POA: Diagnosis not present

## 2022-04-10 DIAGNOSIS — J301 Allergic rhinitis due to pollen: Secondary | ICD-10-CM | POA: Diagnosis not present

## 2022-04-16 DIAGNOSIS — J3089 Other allergic rhinitis: Secondary | ICD-10-CM | POA: Diagnosis not present

## 2022-04-16 DIAGNOSIS — J301 Allergic rhinitis due to pollen: Secondary | ICD-10-CM | POA: Diagnosis not present

## 2022-04-16 DIAGNOSIS — J3081 Allergic rhinitis due to animal (cat) (dog) hair and dander: Secondary | ICD-10-CM | POA: Diagnosis not present

## 2022-04-22 DIAGNOSIS — J3089 Other allergic rhinitis: Secondary | ICD-10-CM | POA: Diagnosis not present

## 2022-04-22 DIAGNOSIS — J301 Allergic rhinitis due to pollen: Secondary | ICD-10-CM | POA: Diagnosis not present

## 2022-04-22 DIAGNOSIS — J3081 Allergic rhinitis due to animal (cat) (dog) hair and dander: Secondary | ICD-10-CM | POA: Diagnosis not present

## 2022-04-30 DIAGNOSIS — J301 Allergic rhinitis due to pollen: Secondary | ICD-10-CM | POA: Diagnosis not present

## 2022-04-30 DIAGNOSIS — J3089 Other allergic rhinitis: Secondary | ICD-10-CM | POA: Diagnosis not present

## 2022-05-08 DIAGNOSIS — J3089 Other allergic rhinitis: Secondary | ICD-10-CM | POA: Diagnosis not present

## 2022-05-08 DIAGNOSIS — J301 Allergic rhinitis due to pollen: Secondary | ICD-10-CM | POA: Diagnosis not present

## 2022-05-08 DIAGNOSIS — J3081 Allergic rhinitis due to animal (cat) (dog) hair and dander: Secondary | ICD-10-CM | POA: Diagnosis not present

## 2022-05-13 DIAGNOSIS — J3089 Other allergic rhinitis: Secondary | ICD-10-CM | POA: Diagnosis not present

## 2022-05-13 DIAGNOSIS — J3081 Allergic rhinitis due to animal (cat) (dog) hair and dander: Secondary | ICD-10-CM | POA: Diagnosis not present

## 2022-05-13 DIAGNOSIS — J301 Allergic rhinitis due to pollen: Secondary | ICD-10-CM | POA: Diagnosis not present

## 2022-05-18 ENCOUNTER — Ambulatory Visit (INDEPENDENT_AMBULATORY_CARE_PROVIDER_SITE_OTHER): Payer: PPO | Admitting: Gastroenterology

## 2022-05-19 DIAGNOSIS — J3089 Other allergic rhinitis: Secondary | ICD-10-CM | POA: Diagnosis not present

## 2022-05-19 DIAGNOSIS — J301 Allergic rhinitis due to pollen: Secondary | ICD-10-CM | POA: Diagnosis not present

## 2022-05-19 DIAGNOSIS — J3081 Allergic rhinitis due to animal (cat) (dog) hair and dander: Secondary | ICD-10-CM | POA: Diagnosis not present

## 2022-05-28 DIAGNOSIS — J301 Allergic rhinitis due to pollen: Secondary | ICD-10-CM | POA: Diagnosis not present

## 2022-05-28 DIAGNOSIS — J3089 Other allergic rhinitis: Secondary | ICD-10-CM | POA: Diagnosis not present

## 2022-05-30 ENCOUNTER — Other Ambulatory Visit (INDEPENDENT_AMBULATORY_CARE_PROVIDER_SITE_OTHER): Payer: Self-pay | Admitting: Gastroenterology

## 2022-06-03 DIAGNOSIS — J3089 Other allergic rhinitis: Secondary | ICD-10-CM | POA: Diagnosis not present

## 2022-06-03 DIAGNOSIS — J301 Allergic rhinitis due to pollen: Secondary | ICD-10-CM | POA: Diagnosis not present

## 2022-06-03 DIAGNOSIS — J3081 Allergic rhinitis due to animal (cat) (dog) hair and dander: Secondary | ICD-10-CM | POA: Diagnosis not present

## 2022-06-11 DIAGNOSIS — J3089 Other allergic rhinitis: Secondary | ICD-10-CM | POA: Diagnosis not present

## 2022-06-11 DIAGNOSIS — J301 Allergic rhinitis due to pollen: Secondary | ICD-10-CM | POA: Diagnosis not present

## 2022-06-16 ENCOUNTER — Observation Stay (HOSPITAL_COMMUNITY)
Admission: EM | Admit: 2022-06-16 | Discharge: 2022-06-18 | Disposition: A | Discharge status: Home / Self Care | Payer: PPO | Attending: Internal Medicine | Admitting: Internal Medicine

## 2022-06-16 ENCOUNTER — Emergency Department (HOSPITAL_COMMUNITY): Payer: PPO

## 2022-06-16 ENCOUNTER — Encounter (HOSPITAL_COMMUNITY): Payer: Self-pay | Admitting: *Deleted

## 2022-06-16 DIAGNOSIS — E039 Hypothyroidism, unspecified: Secondary | ICD-10-CM | POA: Diagnosis not present

## 2022-06-16 DIAGNOSIS — J45909 Unspecified asthma, uncomplicated: Secondary | ICD-10-CM | POA: Insufficient documentation

## 2022-06-16 DIAGNOSIS — G2581 Restless legs syndrome: Secondary | ICD-10-CM | POA: Diagnosis not present

## 2022-06-16 DIAGNOSIS — R29818 Other symptoms and signs involving the nervous system: Secondary | ICD-10-CM | POA: Diagnosis not present

## 2022-06-16 DIAGNOSIS — F419 Anxiety disorder, unspecified: Secondary | ICD-10-CM | POA: Diagnosis present

## 2022-06-16 DIAGNOSIS — Z87891 Personal history of nicotine dependence: Secondary | ICD-10-CM | POA: Diagnosis not present

## 2022-06-16 DIAGNOSIS — G459 Transient cerebral ischemic attack, unspecified: Secondary | ICD-10-CM | POA: Diagnosis not present

## 2022-06-16 DIAGNOSIS — R4182 Altered mental status, unspecified: Secondary | ICD-10-CM | POA: Diagnosis present

## 2022-06-16 DIAGNOSIS — Z96611 Presence of right artificial shoulder joint: Secondary | ICD-10-CM | POA: Insufficient documentation

## 2022-06-16 DIAGNOSIS — Z79899 Other long term (current) drug therapy: Secondary | ICD-10-CM | POA: Diagnosis not present

## 2022-06-16 DIAGNOSIS — R7401 Elevation of levels of liver transaminase levels: Secondary | ICD-10-CM | POA: Insufficient documentation

## 2022-06-16 DIAGNOSIS — I639 Cerebral infarction, unspecified: Secondary | ICD-10-CM | POA: Diagnosis not present

## 2022-06-16 DIAGNOSIS — K219 Gastro-esophageal reflux disease without esophagitis: Secondary | ICD-10-CM | POA: Diagnosis present

## 2022-06-16 DIAGNOSIS — R739 Hyperglycemia, unspecified: Secondary | ICD-10-CM | POA: Insufficient documentation

## 2022-06-16 DIAGNOSIS — F32A Depression, unspecified: Secondary | ICD-10-CM | POA: Diagnosis present

## 2022-06-16 DIAGNOSIS — R9431 Abnormal electrocardiogram [ECG] [EKG]: Secondary | ICD-10-CM | POA: Diagnosis not present

## 2022-06-16 DIAGNOSIS — G8929 Other chronic pain: Secondary | ICD-10-CM | POA: Diagnosis present

## 2022-06-16 LAB — DIFFERENTIAL
Abs Immature Granulocytes: 0.02 10*3/uL (ref 0.00–0.07)
Basophils Absolute: 0 10*3/uL (ref 0.0–0.1)
Basophils Relative: 1 %
Eosinophils Absolute: 0.2 10*3/uL (ref 0.0–0.5)
Eosinophils Relative: 3 %
Immature Granulocytes: 0 %
Lymphocytes Relative: 37 %
Lymphs Abs: 2.4 10*3/uL (ref 0.7–4.0)
Monocytes Absolute: 0.6 10*3/uL (ref 0.1–1.0)
Monocytes Relative: 10 %
Neutro Abs: 3.2 10*3/uL (ref 1.7–7.7)
Neutrophils Relative %: 49 %

## 2022-06-16 LAB — CBG MONITORING, ED: Glucose-Capillary: 120 mg/dL — ABNORMAL HIGH (ref 70–99)

## 2022-06-16 LAB — COMPREHENSIVE METABOLIC PANEL
ALT: 46 U/L — ABNORMAL HIGH (ref 0–44)
AST: 116 U/L — ABNORMAL HIGH (ref 15–41)
Albumin: 4.3 g/dL (ref 3.5–5.0)
Alkaline Phosphatase: 63 U/L (ref 38–126)
Anion gap: 9 (ref 5–15)
BUN: 14 mg/dL (ref 8–23)
CO2: 27 mmol/L (ref 22–32)
Calcium: 9.6 mg/dL (ref 8.9–10.3)
Chloride: 97 mmol/L — ABNORMAL LOW (ref 98–111)
Creatinine, Ser: 0.93 mg/dL (ref 0.44–1.00)
GFR, Estimated: >60 mL/min (ref >60)
Glucose, Bld: 117 mg/dL — ABNORMAL HIGH (ref 70–99)
Potassium: 3.9 mmol/L (ref 3.5–5.1)
Sodium: 133 mmol/L — ABNORMAL LOW (ref 135–145)
Total Bilirubin: 0.6 mg/dL (ref 0.3–1.2)
Total Protein: 7.8 g/dL (ref 6.5–8.1)

## 2022-06-16 LAB — CBC
HCT: 37.4 % (ref 36.0–46.0)
Hemoglobin: 13 g/dL (ref 12.0–15.0)
MCH: 33.1 pg (ref 26.0–34.0)
MCHC: 34.8 g/dL (ref 30.0–36.0)
MCV: 95.2 fL (ref 80.0–100.0)
Platelets: 262 10*3/uL (ref 150–400)
RBC: 3.93 MIL/uL (ref 3.87–5.11)
RDW: 12.7 % (ref 11.5–15.5)
WBC: 6.4 10*3/uL (ref 4.0–10.5)
nRBC: 0 % (ref 0.0–0.2)

## 2022-06-16 LAB — APTT: aPTT: 27 seconds (ref 24–36)

## 2022-06-16 LAB — PROTIME-INR
INR: 0.9 (ref 0.8–1.2)
Prothrombin Time: 12.3 seconds (ref 11.4–15.2)

## 2022-06-16 MED ORDER — VITAMIN D 25 MCG (1000 UNIT) PO TABS
5000.0000 [IU] | ORAL_TABLET | Freq: Every day | ORAL | Status: DC
  Administered 2022-06-17 – 2022-06-18 (×2): 5000 [IU] via ORAL
  Filled 2022-06-16 (×2): qty 5

## 2022-06-16 MED ORDER — DICYCLOMINE HCL 10 MG PO CAPS: 10.0000 mg | ORAL_CAPSULE | Freq: Two times a day (BID) | ORAL | Status: DC | PRN

## 2022-06-16 MED ORDER — LEVOTHYROXINE SODIUM 25 MCG PO TABS
25.0000 ug | ORAL_TABLET | Freq: Every day | ORAL | Status: DC
  Administered 2022-06-17 – 2022-06-18 (×2): 25 ug via ORAL
  Filled 2022-06-16 (×2): qty 1

## 2022-06-16 MED ORDER — ROPINIROLE HCL 1 MG PO TABS
4.0000 mg | ORAL_TABLET | Freq: Every day | ORAL | Status: DC
  Administered 2022-06-16 – 2022-06-17 (×2): 4 mg via ORAL
  Filled 2022-06-16 (×2): qty 4

## 2022-06-16 MED ORDER — ALPRAZOLAM 0.5 MG PO TABS: 0.5000 mg | ORAL_TABLET | Freq: Every day | ORAL | Status: DC | PRN

## 2022-06-16 MED ORDER — ALBUTEROL SULFATE HFA 108 (90 BASE) MCG/ACT IN AERS: 2.0000 | INHALATION_SPRAY | RESPIRATORY_TRACT | Status: DC | PRN

## 2022-06-16 MED ORDER — ALBUTEROL SULFATE (2.5 MG/3ML) 0.083% IN NEBU: 2.5000 mg | INHALATION_SOLUTION | RESPIRATORY_TRACT | Status: DC | PRN

## 2022-06-16 MED ORDER — ESCITALOPRAM OXALATE 10 MG PO TABS
20.0000 mg | ORAL_TABLET | Freq: Every day | ORAL | Status: DC
  Administered 2022-06-17 – 2022-06-18 (×2): 20 mg via ORAL
  Filled 2022-06-16 (×2): qty 2

## 2022-06-16 MED ORDER — PANTOPRAZOLE SODIUM 40 MG PO TBEC
40.0000 mg | DELAYED_RELEASE_TABLET | Freq: Every day | ORAL | Status: DC
  Administered 2022-06-17 – 2022-06-18 (×2): 40 mg via ORAL
  Filled 2022-06-16 (×2): qty 1

## 2022-06-16 MED ORDER — IOHEXOL 350 MG/ML SOLN
100.0000 mL | Freq: Once | INTRAVENOUS | Status: AC | PRN
  Administered 2022-06-16: 100 mL via INTRAVENOUS

## 2022-06-16 NOTE — Progress Notes (Addendum)
CODE STROKE  Elert @ 2208 LKWT @ 1700 Already back from CT before Code Stroke care initiated.   Called and discussed case with Dr. Kathrynn Humble @ 2018 to gather more information as RN was at bedside but did have information on activation as she was performing patient care.  TSMD paged @ 2218 Dr. Ria Bush on screen @ 2220  Dr. Ria Bush to contact EDP to further discuss treatment and admission. Not further interventions at this time.

## 2022-06-16 NOTE — Consult Note (Signed)
[facility] Tele[facility]urology Consult Services   Patient Name:   Kathryn [name], Kathryn [name] Date of Birth:   03-04-1956 Identification Number:   MRN - [id number] Date of Service:   06/16/2022 22:18:00  Diagnosis:       R29.818 - Transient neurological symptoms  Impression:      66 y/o woman presenting with about 45 minutes of inability to read, inability to recall names. Etiology unclear, perhaps she is describing aphasia or dissociative amnesia. Denies any tongue biting or urinary incontinence to suggest seizure, but does have sore muscles. TIA is certainly a possibility, as is seizure or even migraine. Would recommend MRI brain and routine EEG to start.  Our recommendations are outlined below.  Recommendations:        Stroke/Telemetry Floor       Neuro Checks       Bedside Swallow Eval       DVT Prophylaxis       IV Fluids, Normal Saline       Head of Bed 30 Degrees       Euglycemia and Avoid Hyperthermia (PRN Acetaminophen)       Initiate or continue Aspirin 81 MG daily       Initiate Aspirin/dipyridamole '25mg'$  IR/200 mg ER twice daily  Per facility request will defer further work up, management, and referrals to inpatient service, inclusive of inpatient neurology consult.  Sign Out:       Discussed with Emergency Department Provider    ------------------------------------------------------------------------------  Advanced Imaging: CTA Head and Neck Completed.  LVO:No  Patient in not a candidate for NIR   Metrics: Last Known Well: 06/16/2022 20:00:00 [facility] Notification Time: 06/16/2022 22:18:00 Arrival Time: 06/16/2022 21:07:00 Stamp Time: 06/16/2022 22:18:00 Initial Response Time: 06/16/2022 22:19:22 Symptoms: transient episode . Initial patient interaction: 06/16/2022 22:25:46 NIHSS Assessment Completed: 06/16/2022 22:31:35 Patient is not a candidate for Thrombolytic. Thrombolytic Medical Decision: 06/16/2022 22:31:35 Patient was not deemed  candidate for Thrombolytic because of following reasons: Resolved symptoms (no residual disabling symptoms).  CT head showed no acute hemorrhage or acute core infarct.  Primary Provider Notified of Diagnostic Impression and Management Plan on: 06/16/2022 22:52:14    ------------------------------------------------------------------------------  History of Present Illness: Patient is a 66 year old Female.  Patient was brought by private transportation with symptoms of transient episode . 66 y/o woman presenting with transient neurologic episode. States that she was at home, starting at about 2000 she had an episode where she was unable to read, she couldn't remember who she was, who her husband was. She was unable to remember names of others as well. She did admit to a headache. Had a similar episode in the past about 4-5 years ago but was not worked up. Lasted about 45 minutes or so.    Past Medical History: [place] PMH:  Hypothyroidism  Medications:  No Anticoagulant use  No Antiplatelet use Reviewed EMR for current medications  Allergies:  Reviewed  Social History: Drug Use: No  Family History:  There is no family history of premature cerebrovascular disease pertinent to this consultation  ROS : 14 Points Review of Systems was performed and was negative except mentioned in HPI.  Past Surgical History: There Is No Surgical History Contributory To Today's Visit    Examination: BP(146/72), Pulse(72), Blood Glucose(120) 1A: Level of Consciousness - Alert; keenly responsive + 0 1B: Ask Month and Age - Both Questions Right + 0 1C: Blink Eyes & Squeeze Hands - Performs Both Tasks + 0 2: Test Horizontal Extraocular Movements -  Normal + 0 3: Test Visual Fields - No Visual Loss + 0 4: Test Facial Palsy (Use Grimace if Obtunded) - Normal symmetry + 0 5A: Test Left Arm Motor Drift - No Drift for 10 Seconds + 0 5B: Test Right Arm Motor Drift - No Drift for 10 Seconds +  0 6A: Test Left Leg Motor Drift - No Drift for 5 Seconds + 0 6B: Test Right Leg Motor Drift - No Drift for 5 Seconds + 0 7: Test Limb Ataxia (FNF/Heel-Shin) - No Ataxia + 0 8: Test Sensation - Normal; No sensory loss + 0 9: Test Language/Aphasia - Normal; No aphasia + 0 10: Test Dysarthria - Normal + 0 11: Test Extinction/Inattention - No abnormality + 0  NIHSS Score: 0   Pre-Morbid Modified Rankin Scale: 0 Points = No symptoms at all  Spoke with : Dr. Kathrynn Humble  Patient/Family was informed the Neurology Consult would occur via TeleHealth consult by way of interactive audio and video telecommunications and consented to receiving care in this manner.   Patient is being evaluated for possible acute neurologic impairment and high probability of imminent or life-threatening deterioration. I spent total of 35 minutes providing care to this patient, including time for face to face visit via telemedicine, review of medical records, imaging studies and discussion of findings with providers, the patient and/or family.   Dr Shari Prows   [facility] For Inpatient follow-up with [facility] physician please call [facility] [phone]. This is not an outpatient service. Post hospital discharge, please contact hospital directly.

## 2022-06-16 NOTE — ED Triage Notes (Addendum)
Pt states she could not remember her husband's name and other names, occurred about an hour ago.  Last time seen normal at 1900 per husband.  Pt states she is able to remember some of the names now. C/o HA

## 2022-06-16 NOTE — ED Notes (Signed)
Tele neuro started

## 2022-06-16 NOTE — ED Notes (Signed)
Code stroke activated by secretary per Dr. Tammy Sours. Kathryn [name]

## 2022-06-16 NOTE — ED Provider Notes (Signed)
[facility] EMERGENCY DEPARTMENT Provider Note   CSN: [id number] Arrival date & time: 06/16/22  2107  An emergency department physician performed an initial assessment on this suspected stroke patient at 2144.  History {Add pertinent medical, surgical, social history, OB history to HPI:1} Chief Complaint  Patient presents with   Altered Mental Status    Kathryn [name] is a 66 y.o. female.  HPI    66 year old female comes in with chief complaint of altered mental status.  Home Medications Prior to Admission medications   Medication Sig Start Date End Date Taking? Authorizing Provider  acetaminophen (TYLENOL) 500 MG tablet Take 500-1,000 mg by mouth every 6 (six) hours as needed (For pain.).    [provider]  albuterol (PROVENTIL HFA;VENTOLIN HFA) 108 (90 Base) MCG/ACT inhaler Inhale 2 puffs into the lungs every 4 (four) hours as needed for wheezing or shortness of breath.    [provider]  ALPRAZolam Duanne Moron) 0.5 MG tablet Take 0.5 mg by mouth daily as needed for anxiety.    [provider]  Biotin 10000 MCG TABS Take 10,000 mcg by mouth daily.     [provider]  Calcium Carbonate-Vitamin D (CALTRATE 600+D PO) Take 1 tablet by mouth daily.    [provider]  celecoxib (CELEBREX) 200 MG capsule Take 200 mg by mouth 2 (two) times daily as needed for moderate pain or mild pain (Back. neck and shoulder). 01/16/22   [provider]  Cholecalciferol (VITAMIN D-3) 125 MCG (5000 UT) TABS Take 5,000 Units by mouth daily.     [provider]  dicyclomine (BENTYL) 10 MG capsule Take 1 capsule (10 mg total) by mouth 2 (two) times daily as needed (irritable bowel syndrome). 04/10/21   Carlan, Chelsea L, NP  docusate sodium (COLACE) 50 MG capsule Take 50 mg by mouth daily as needed for mild constipation or moderate constipation.    [provider]  EPINEPHrine 0.3 mg/0.3 mL IJ SOAJ injection Inject 0.3 mg into the skin as  needed for anaphylaxis. 11/21/21   [provider]  escitalopram (LEXAPRO) 20 MG tablet Take 20 mg by mouth daily.    [provider]  esomeprazole (NEXIUM) 40 MG capsule TAKE 1 CAPSULE(40 MG) BY MOUTH TWICE DAILY BEFORE A MEAL 06/01/22   Carlan, Chelsea L, NP  fluticasone (FLONASE) 50 MCG/ACT nasal spray Place 2 sprays into both nostrils daily as needed for allergies or rhinitis.    [provider]  ibuprofen (ADVIL) 200 MG tablet Take 800 mg by mouth every 6 (six) hours as needed for moderate pain.    [provider]  levothyroxine (SYNTHROID) 25 MCG tablet Take 25 mcg by mouth daily before breakfast.    [provider]  loratadine (CLARITIN) 10 MG tablet Take 10 mg by mouth daily.    [provider]  MAGNESIUM PO Take 1 tablet by mouth at bedtime.    [provider]  meclizine (ANTIVERT) 25 MG tablet Take 25 mg by mouth 3 (three) times daily as needed for dizziness. Patient not sure of mg    [provider]  montelukast (SINGULAIR) 10 MG tablet Take 10 mg by mouth every morning.     [provider]  ondansetron (ZOFRAN) 4 MG tablet Take 1 tablet (4 mg total) by mouth every 8 (eight) hours as needed for nausea or vomiting. 03/24/22 03/24/23  Montez Morita, Quillian Quince, MD  rOPINIRole (REQUIP) 2 MG tablet Take 4 mg by mouth at bedtime. 02/25/22  [provider]  traMADol (ULTRAM) 50 MG tablet Take 50-100 mg by mouth every 12 (twelve) hours as needed for severe pain.     [provider]  valACYclovir (VALTREX) 1000 MG tablet Take 1,000 mg by mouth daily. 10/29/16   [provider]      Allergies    Niacin and related, Codeine, and Zinc    Review of Systems   Review of Systems  Physical Exam Updated Vital Signs BP (!) 156/82   Pulse 71   Temp 97.9 F (36.6 C) (Oral)   Resp 15   Ht '5\' 7"'$  (1.702 m)   Wt 63.5 kg   SpO2 98%   BMI 21.93 kg/m  Physical Exam  ED Results / Procedures /  Treatments   Labs (all labs ordered are listed, but only abnormal results are displayed) Labs Reviewed  COMPREHENSIVE METABOLIC PANEL - Abnormal; Notable for the following components:      Result Value   Sodium 133 (*)    Chloride 97 (*)    Glucose, Bld 117 (*)    AST 116 (*)    ALT 46 (*)    All other components within normal limits  CBG MONITORING, ED - Abnormal; Notable for the following components:   Glucose-Capillary 120 (*)    All other components within normal limits  PROTIME-INR  APTT  CBC  DIFFERENTIAL  RAPID URINE DRUG SCREEN, HOSP PERFORMED  URINALYSIS, ROUTINE W REFLEX MICROSCOPIC  I-STAT CHEM 8, ED    EKG None  Radiology CT ANGIO HEAD NECK W WO CM (CODE STROKE)  Result Date: 06/16/2022 CLINICAL DATA:  Acute neurologic deficit EXAM: CT ANGIOGRAPHY HEAD AND NECK TECHNIQUE: Multidetector CT imaging of the head and neck was performed using the standard protocol during bolus administration of intravenous contrast. Multiplanar CT image reconstructions and MIPs were obtained to evaluate the vascular anatomy. Carotid stenosis measurements (when applicable) are obtained utilizing NASCET criteria, using the distal internal carotid diameter as the denominator. RADIATION DOSE REDUCTION: This exam was performed according to the departmental dose-optimization program which includes automated exposure control, adjustment of the mA and/or kV according to patient size and/or use of iterative reconstruction technique. CONTRAST:  124m OMNIPAQUE IOHEXOL 350 MG/ML SOLN COMPARISON:  None Available. FINDINGS: CTA NECK FINDINGS SKELETON: There is no bony spinal canal stenosis. No lytic or blastic lesion. Multilevel cervical anterior fusion. OTHER NECK: Normal pharynx, larynx and major salivary glands. No cervical lymphadenopathy. Unremarkable thyroid gland. UPPER CHEST: No pneumothorax or pleural effusion. No nodules or masses. AORTIC ARCH: There is no calcific atherosclerosis of the aortic  arch. There is no aneurysm, dissection or hemodynamically significant stenosis of the visualized portion of the aorta. Conventional 3 vessel aortic branching pattern. The visualized proximal subclavian arteries are widely patent. RIGHT CAROTID SYSTEM: Normal without aneurysm, dissection or stenosis. LEFT CAROTID SYSTEM: Normal without aneurysm, dissection or stenosis. VERTEBRAL ARTERIES: Left dominant configuration. Both origins are clearly patent. There is no dissection, occlusion or flow-limiting stenosis to the skull base (V1-V3 segments). CTA HEAD FINDINGS POSTERIOR CIRCULATION: --Vertebral arteries: Normal V4 segments. --Inferior cerebellar arteries: Normal. --Basilar artery: 3 x 5 mm aneurysm at the tip of the basilar artery. --Superior cerebellar arteries: Normal. --Posterior cerebral arteries (PCA): Normal. ANTERIOR CIRCULATION: --Intracranial internal carotid arteries: Normal. --Anterior cerebral arteries (ACA): Normal. Both A1 segments are present. Patent anterior communicating artery (a-comm). --Middle cerebral arteries (MCA): Normal. VENOUS SINUSES: As permitted by contrast timing, patent. ANATOMIC VARIANTS: Fetal origin of the right posterior cerebral artery. Review  of the MIP images confirms the above findings. IMPRESSION: 1. No emergent large vessel occlusion or high-grade stenosis of the intracranial arteries. 2. 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 Electronically Signed   By: Ulyses Jarred M.D.   On: 06/16/2022 22:09   CT HEAD CODE STROKE WO CONTRAST  Result Date: 06/16/2022 CLINICAL DATA:  Code stroke.  Aphasia EXAM: CT HEAD WITHOUT CONTRAST TECHNIQUE: Contiguous axial images were obtained from the base of the skull through the vertex without intravenous contrast. RADIATION DOSE REDUCTION: This exam was performed according to the departmental dose-optimization program which includes automated exposure control, adjustment of the mA and/or kV according to patient size and/or use  of iterative reconstruction technique. COMPARISON:  None Available. FINDINGS: Brain: There is no mass, hemorrhage or extra-axial collection. The size and configuration of the ventricles and extra-axial CSF spaces are normal. The brain parenchyma is normal, without evidence of acute or chronic infarction. Vascular: No abnormal hyperdensity of the major intracranial arteries or dural venous sinuses. No intracranial atherosclerosis. Skull: The visualized skull base, calvarium and extracranial soft tissues are normal. Sinuses/Orbits: No fluid levels or advanced mucosal thickening of the visualized paranasal sinuses. No mastoid or middle ear effusion. The orbits are normal. ASPECTS [facility] Stroke Program Early CT Score) - Ganglionic level infarction (caudate, lentiform nuclei, internal capsule, insula, M1-M3 cortex): 7 - Supraganglionic infarction (M4-M6 cortex): 3 Total score (0-10 with 10 being normal): 10 IMPRESSION: 1. No acute intracranial abnormality. 2. ASPECTS is 10. These results were called by telephone at the time of interpretation on 06/16/2022 at 9:59 pm to provider [facility] , who verbally acknowledged these results. Electronically Signed   By: Ulyses Jarred M.D.   On: 06/16/2022 21:59    Procedures Procedures  {Document cardiac monitor, telemetry assessment procedure when appropriate:1}  Medications Ordered in ED Medications  iohexol (OMNIPAQUE) 350 MG/ML injection 100 mL (100 mLs Intravenous Contrast Given 06/16/22 2152)    ED Course/ Medical Decision Making/ A&P                           Medical Decision Making Amount and/or Complexity of Data Reviewed Labs: ordered. Radiology: ordered.  Risk Prescription drug management. Decision regarding hospitalization.   ***  {Document critical care time when appropriate:1} {Document review of labs and clinical decision tools ie heart score, Chads2Vasc2 etc:1}  {Document your independent review of radiology images, and any outside  records:1} {Document your discussion with family members, caretakers, and with consultants:1} {Document social determinants of health affecting pt's care:1} {Document your decision making why or why not admission, treatments were needed:1} Final Clinical Impression(s) / ED Diagnoses Final diagnoses:  TIA (transient ischemic attack)    Rx / DC Orders ED Discharge Orders     None

## 2022-06-16 NOTE — ED Notes (Signed)
Wynona paged @ 2143

## 2022-06-17 ENCOUNTER — Observation Stay (HOSPITAL_COMMUNITY): Payer: PPO

## 2022-06-17 ENCOUNTER — Observation Stay (HOSPITAL_COMMUNITY)
Admit: 2022-06-17 | Discharge: 2022-06-17 | Disposition: A | Discharge status: Home / Self Care | Payer: PPO | Attending: Internal Medicine | Admitting: Internal Medicine

## 2022-06-17 ENCOUNTER — Observation Stay (HOSPITAL_BASED_OUTPATIENT_CLINIC_OR_DEPARTMENT_OTHER): Payer: PPO

## 2022-06-17 DIAGNOSIS — G459 Transient cerebral ischemic attack, unspecified: Secondary | ICD-10-CM | POA: Diagnosis present

## 2022-06-17 DIAGNOSIS — E039 Hypothyroidism, unspecified: Secondary | ICD-10-CM | POA: Diagnosis not present

## 2022-06-17 DIAGNOSIS — J4520 Mild intermittent asthma, uncomplicated: Secondary | ICD-10-CM | POA: Diagnosis not present

## 2022-06-17 DIAGNOSIS — R7401 Elevation of levels of liver transaminase levels: Secondary | ICD-10-CM | POA: Diagnosis not present

## 2022-06-17 DIAGNOSIS — G8929 Other chronic pain: Secondary | ICD-10-CM

## 2022-06-17 DIAGNOSIS — R739 Hyperglycemia, unspecified: Secondary | ICD-10-CM

## 2022-06-17 DIAGNOSIS — F419 Anxiety disorder, unspecified: Secondary | ICD-10-CM | POA: Diagnosis not present

## 2022-06-17 DIAGNOSIS — R4182 Altered mental status, unspecified: Secondary | ICD-10-CM | POA: Diagnosis not present

## 2022-06-17 DIAGNOSIS — K219 Gastro-esophageal reflux disease without esophagitis: Secondary | ICD-10-CM

## 2022-06-17 DIAGNOSIS — J3489 Other specified disorders of nose and nasal sinuses: Secondary | ICD-10-CM | POA: Diagnosis not present

## 2022-06-17 DIAGNOSIS — J45909 Unspecified asthma, uncomplicated: Secondary | ICD-10-CM | POA: Diagnosis present

## 2022-06-17 DIAGNOSIS — G2581 Restless legs syndrome: Secondary | ICD-10-CM

## 2022-06-17 DIAGNOSIS — R29818 Other symptoms and signs involving the nervous system: Secondary | ICD-10-CM | POA: Diagnosis not present

## 2022-06-17 LAB — ECHOCARDIOGRAM COMPLETE
Area-P 1/2: 3.42 cm2
Calc EF: 50 %
Height: 67 in
S' Lateral: 3.5 cm
Single Plane A2C EF: 51.9 %
Single Plane A4C EF: 49.4 %
Weight: 2240 oz

## 2022-06-17 LAB — URINALYSIS, ROUTINE W REFLEX MICROSCOPIC
Bilirubin Urine: NEGATIVE
Glucose, UA: NEGATIVE mg/dL
Hgb urine dipstick: NEGATIVE
Ketones, ur: NEGATIVE mg/dL
Leukocytes,Ua: NEGATIVE
Nitrite: NEGATIVE
Protein, ur: NEGATIVE mg/dL
Specific Gravity, Urine: 1.014 (ref 1.005–1.030)
pH: 7 (ref 5.0–8.0)

## 2022-06-17 LAB — TSH: TSH: 0.682 u[IU]/mL (ref 0.350–4.500)

## 2022-06-17 LAB — RAPID URINE DRUG SCREEN, HOSP PERFORMED
Amphetamines: NOT DETECTED
Barbiturates: NOT DETECTED
Benzodiazepines: NOT DETECTED
Cocaine: NOT DETECTED
Opiates: NOT DETECTED
Tetrahydrocannabinol: NOT DETECTED

## 2022-06-17 LAB — HEMOGLOBIN A1C
Hgb A1c MFr Bld: 5.3 % (ref 4.8–5.6)
Mean Plasma Glucose: 105.41 mg/dL

## 2022-06-17 LAB — VITAMIN B12: Vitamin B-12: 324 pg/mL (ref 180–914)

## 2022-06-17 MED ORDER — SODIUM CHLORIDE 0.9 % IV SOLN: INTRAVENOUS | Status: AC

## 2022-06-17 MED ORDER — ACETAMINOPHEN 160 MG/5ML PO SOLN: 650.0000 mg | ORAL | Status: DC | PRN

## 2022-06-17 MED ORDER — MONTELUKAST SODIUM 10 MG PO TABS
10.0000 mg | ORAL_TABLET | Freq: Every day | ORAL | Status: DC
  Administered 2022-06-17: 10 mg via ORAL
  Filled 2022-06-17: qty 1

## 2022-06-17 MED ORDER — ASPIRIN 81 MG PO TBEC
81.0000 mg | DELAYED_RELEASE_TABLET | Freq: Every day | ORAL | Status: DC
  Administered 2022-06-17 – 2022-06-18 (×2): 81 mg via ORAL
  Filled 2022-06-17 (×2): qty 1

## 2022-06-17 MED ORDER — ACETAMINOPHEN 650 MG RE SUPP: 650.0000 mg | RECTAL | Status: DC | PRN

## 2022-06-17 MED ORDER — STROKE: EARLY STAGES OF RECOVERY BOOK
Freq: Once | Status: AC
  Filled 2022-06-17: qty 1

## 2022-06-17 MED ORDER — FLUTICASONE PROPIONATE 50 MCG/ACT NA SUSP: 2.0000 | Freq: Every day | NASAL | Status: DC | PRN

## 2022-06-17 MED ORDER — BIOTIN 10000 MCG PO TABS: 10000.0000 ug | ORAL_TABLET | Freq: Every day | ORAL | Status: DC

## 2022-06-17 MED ORDER — CLOPIDOGREL BISULFATE 75 MG PO TABS: 75.0000 mg | ORAL_TABLET | Freq: Every day | ORAL | Status: DC

## 2022-06-17 MED ORDER — ACETAMINOPHEN 325 MG PO TABS: 650.0000 mg | ORAL_TABLET | ORAL | Status: DC | PRN

## 2022-06-17 MED ORDER — TRAMADOL HCL 50 MG PO TABS: 50.0000 mg | ORAL_TABLET | Freq: Two times a day (BID) | ORAL | Status: DC | PRN

## 2022-06-17 MED ORDER — ENOXAPARIN SODIUM 40 MG/0.4ML IJ SOSY
40.0000 mg | PREFILLED_SYRINGE | INTRAMUSCULAR | Status: DC
  Administered 2022-06-17 – 2022-06-18 (×2): 40 mg via SUBCUTANEOUS
  Filled 2022-06-17 (×2): qty 0.4

## 2022-06-17 MED ORDER — LORATADINE 10 MG PO TABS
10.0000 mg | ORAL_TABLET | Freq: Every day | ORAL | Status: DC
  Administered 2022-06-17 – 2022-06-18 (×2): 10 mg via ORAL
  Filled 2022-06-17 (×2): qty 1

## 2022-06-17 MED ORDER — VALACYCLOVIR HCL 500 MG PO TABS
1000.0000 mg | ORAL_TABLET | Freq: Every day | ORAL | Status: DC
  Administered 2022-06-17 – 2022-06-18 (×2): 1000 mg via ORAL
  Filled 2022-06-17 (×2): qty 2

## 2022-06-17 MED ORDER — ONDANSETRON HCL 4 MG/2ML IJ SOLN: 4.0000 mg | Freq: Four times a day (QID) | INTRAMUSCULAR | Status: DC | PRN

## 2022-06-17 NOTE — Progress Notes (Signed)
EEG complete - results pending 

## 2022-06-17 NOTE — Progress Notes (Signed)
Echocardiogram 2D Echocardiogram has been performed.  Kathryn [name] 06/17/2022, 10:18 AM

## 2022-06-17 NOTE — Progress Notes (Signed)
CODE STROKE  CALL TIME   BEEPER   2147 START    2152 END    2156 [facility]    2156 Epic    2156 RAD    2157

## 2022-06-17 NOTE — Evaluation (Signed)
Physical Therapy Evaluation Patient Details Name: Kathryn [name] MRN: [id number] DOB: [birth year] Today's Date: 06/17/2022  History of Present Illness  66 year old female comes in with chief complaint of altered mental status.  Patient has past medical history of adrenal insufficiency, anxiety, asthma, chronic bronchitis, GERD, hiatal hernia, Gilliam Barr syndrome, restless leg syndrome, IBS.     According to the patient's husband, patient was fine at 7 or 7:30 PM and had supper.  Thereafter she had gone to the washroom.  She was unable to read a book while in the restroom.  Thereafter she could not recall patient's husband's name.  Patient was confused.  Patient currently complains of some blurry vision and mild left-sided facial numbness.  No history of strokes.   Clinical Impression  Patient functioning near baseline for functional mobility and gait. Patient demonstrates good return for hallway ambulation on level, declined and inclined surfaces without loss of balance/AD. Plan: patient discharged from physical therapy to care of nursing for ambulation daily as tolerated for length of stay.      Recommendations for follow up therapy are one component of a multi-disciplinary discharge planning process, led by the attending physician.  Recommendations may be updated based on patient status, additional functional criteria and insurance authorization.  Follow Up Recommendations No PT follow up      Assistance Recommended at Discharge PRN  Patient can return home with the following  Other (comment) (patient functioning near baseline, independent)    Equipment Recommendations None recommended by PT  Recommendations for Other Services       Functional Status Assessment Patient has not had a recent decline in their functional status     Precautions / Restrictions Precautions Precautions: None Restrictions Weight Bearing Restrictions: No      Mobility  Bed Mobility Overal bed  mobility: Independent                  Transfers Overall transfer level: Independent Equipment used: None                    Ambulation/Gait Ambulation/Gait assistance: Independent Gait Distance (Feet): 175 Feet Assistive device: None Gait Pattern/deviations: WFL(Within Functional Limits) Gait velocity: normal     General Gait Details: patient able to ambulate on level, inclined and declined surfaces without loss of balance/AD  Stairs            Wheelchair Mobility    Modified Rankin (Stroke Patients Only)       Balance Overall balance assessment: Independent                                           Pertinent Vitals/Pain Pain Assessment Pain Assessment: No/denies pain    Home Living Family/patient expects to be discharged to:: Private residence Living Arrangements: Spouse/significant other Available Help at Discharge: Family;Available 24 hours/day Type of Home: House Home Access: Stairs to enter Entrance Stairs-Rails: Right;Left;Can reach both Entrance Stairs-Number of Steps: 4   Home Layout: One level Home Equipment: Other (comment);Shower seat - built in (grab bars in walk in shower)      Prior Function Prior Level of Function : Independent/Modified Independent             Mobility Comments: 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, drives ADLs Comments: independent     Hand Dominance   Dominant Hand: Right    Extremity/Trunk Assessment  Upper Extremity Assessment Upper Extremity Assessment: Overall WFL for tasks assessed    Lower Extremity Assessment Lower Extremity Assessment: Overall WFL for tasks assessed    Cervical / Trunk Assessment Cervical / Trunk Assessment: Normal  Communication   Communication: No difficulties  Cognition Arousal/Alertness: Awake/alert Behavior During Therapy: WFL for tasks assessed/performed Overall Cognitive Status: Within Functional Limits for tasks assessed                                           General Comments      Exercises     Assessment/Plan    PT Assessment Patient does not need any further PT services  PT Problem List         PT Treatment Interventions      PT Goals (Current goals can be found in the Care Plan section)  Acute Rehab PT Goals Patient Stated Goal: return home with family PT Goal Formulation: With patient Time For Goal Achievement: 06/19/22 Potential to Achieve Goals: Good    Frequency       Co-evaluation               AM-PAC PT "6 Clicks" Mobility  Outcome Measure Help needed turning from your back to your side while in a flat bed without using bedrails?: None Help needed moving from lying on your back to sitting on the side of a flat bed without using bedrails?: None Help needed moving to and from a bed to a chair (including a wheelchair)?: None Help needed standing up from a chair using your arms (e.g., wheelchair or bedside chair)?: None Help needed to walk in hospital room?: None Help needed climbing 3-5 steps with a railing? : None 6 Click Score: 24    End of Session   Activity Tolerance: Patient tolerated treatment well Patient left: in bed;with call bell/phone within reach (seated EOB) Nurse Communication: Mobility status PT Visit Diagnosis: Unsteadiness on feet (R26.81);Other abnormalities of gait and mobility (R26.89);Muscle weakness (generalized) (M62.81)    Time: 7353-2992 PT Time Calculation (min) (ACUTE ONLY): 12 min   Charges:   PT Evaluation $PT Eval Low Complexity: 1 Low PT Treatments $Therapeutic Activity: 8-22 mins        Zigmund Gottron, SPT

## 2022-06-17 NOTE — Assessment & Plan Note (Addendum)
Continue PPI ?

## 2022-06-17 NOTE — H&P (Signed)
History and Physical    Patient: Kathryn [name] VFI:[id number] DOB: Apr 12, 1956 DOA: 06/16/2022 DOS: the patient was seen and examined on 06/17/2022 PCP: [facility]  Patient coming from: Home  Chief Complaint:  Chief Complaint  Patient presents with   Altered Mental Status   HPI: Kathryn [name] is a 66 y.o. female with medical history significant of with past medical history of hypothyroidism, anxiety/depression, gastroesophageal reflux disease, history of asthma, restless leg syndrome chronic pain; who presented to the hospital secondary to altered mental status.  Patient reports last seen normal around 7 PM on 06/16/2022; she went to the bathroom and noticing difficulties trying to read her book as otherwise she normally will be able to do.  Patient noticed difficulty getting words out and when interacting with family was unable to remember the name of her husband and her children.  Patient reported having some right-sided numbness sensation associated headache.  After discussing with her husband the symptoms patient was brought to the emergency department for further evaluation and management.  Overall symptoms lasted about 45 minutes.  Patient presented past 4 hours after the incident.  She reported experiencing similar episodes in the past but the previous one did not last as long as the current event.  Patient reports no chest pain, no nausea, no vomiting, no focal motor deficits, no fever or chills, no sick contacts or any other complaints.  In the ED work-up demonstrated no signs of acute infection, CT scan of the head without acute intracranial abnormalities and CT angio of the head and neck without large vessel obstruction.  Case discussed with teleneurology who recommended admission for TIA work-up.  TRH consulted to place patient in the hospital for further evaluation and management.  Review of Systems: As mentioned in the history of present illness. All other  systems reviewed and are negative.  Past Medical History:  Diagnosis Date   Adrenal insufficiency ([place])    occured following steroid shoulder injections ~ spring 2017   Anxiety    pt. reports that she has RLS- takes Xanax for calming her self so she can settle & sleep    Arthritis    shoulder & back, knees & leg     Asthma    aleery related   Atypical mole 11/12/2020   mod-severe- mid back   Chest pain, atypical    had cardiac workup - deemed related to anxiety   Chronic bronchitis ([place])    "less since I quit smoking" (02/21/2016)   Chronic lower back pain    Dyspnea    related to spray paint that is put in  beer cans at her place of employment.  Pt. also reports that she has anxiety also that leads to SOB., Pt. reports her Mother just died a week ago     GERD (gastroesophageal reflux disease)    Guillain-Barre syndrome following vaccination ([place]) 1970's   post swine flu shot, hosp. for treatment for 1 month    H/O dizziness    H/O hiatal hernia    Hepatitis    hepatitis as a child; "got it from a little girl at school" make sick and vomiting   Herpes simplex    History of Guillain-Barre syndrome ~ 1978   "from the swine flu shot"   IBS (irritable bowel syndrome)    Pneumonia    PONV (postoperative nausea and vomiting)    Restless leg syndrome    Walking pneumonia ~ 2011   Past Surgical History:  Procedure Laterality Date   32 HOUR [place] STUDY N/A 09/23/2016   Procedure: 24 HOUR PH STUDY;  Surgeon: Mauri Pole, MD;  Location: [facility];  Service: Endoscopy;  Laterality: N/A;   ANTERIOR CERVICAL DECOMP/DISCECTOMY FUSION  2000 & 2015   two times ago- C5 and C6, Dr. Otilio Connors, MD 2015   APPENDECTOMY  1980s   BACK SURGERY     BIOPSY  03/24/2022   Procedure: BIOPSY;  Surgeon: Harvel Quale, MD;  [facility];  Service: Gastroenterology;;   CHOLECYSTECTOMY OPEN  1980s   COLONOSCOPY N/A 09/20/2014   rehman: Prep was excellent except she had  thick layer of stool coating cecal and ascending colon mucosa. Cecal landmarks were well identified after vigorous washing. Small lymphoid polyp ablated via cold biopsy from appendiceal stump.Mucosa of rest of the colon was normal. Normal mucosa of rectum and anorectal junction.   ESOPHAGEAL DILATION N/A 05/29/2019   Procedure: ESOPHAGEAL DILATION;  Surgeon: Rogene Houston, MD;  [facility];  Service: Endoscopy;  Laterality: N/A;   ESOPHAGEAL MANOMETRY N/A 09/23/2016   Procedure: ESOPHAGEAL MANOMETRY (EM);  Surgeon: Mauri Pole, MD;  [facility]: [facility];  Service: Endoscopy;  Laterality: N/A;   ESOPHAGOGASTRODUODENOSCOPY N/A 09/20/2014   Procedure: ESOPHAGOGASTRODUODENOSCOPY (EGD);  Surgeon: Rogene Houston, MD;  [facility];  Service: Endoscopy;  Laterality: N/A;   ESOPHAGOGASTRODUODENOSCOPY (EGD) WITH PROPOFOL N/A 05/29/2019   - Z-line, 38 cm from the incisors.   ESOPHAGOGASTRODUODENOSCOPY (EGD) WITH PROPOFOL N/A 03/24/2022   Procedure: ESOPHAGOGASTRODUODENOSCOPY (EGD) WITH PROPOFOL;  Surgeon: Harvel Quale, MD;  [facility];  Service: Gastroenterology;  Laterality: N/A;  730 ASA 1   HIATAL HERNIA REPAIR N/A 11/27/2016   Procedure: LAPAROSCOPIC REPAIR OF HIATAL HERNIA WITH NISSEN FUNDOPLICATION;  Surgeon: Jackolyn Confer, MD;  Location: [facility];  Service: General;  Laterality: N/A;   INCISION AND DRAINAGE OF WOUND Left 02/21/2016   forearm, minor   LUMBAR LAMINECTOMY/DECOMPRESSION MICRODISCECTOMY Right 06/22/2013   Procedure: RIGHT LUMBAR TWO-THREE DISCECTOMY;  Surgeon: Otilio Connors, MD;  Location: [facility];  Service: Neurosurgery;  Laterality: Right;  Right    MALONEY DILATION N/A 09/20/2014   Procedure: Venia Minks DILATION;  Surgeon: Rogene Houston, MD;  [facility];  Service: Endoscopy;  Laterality: N/A;   MINOR IRRIGATION AND DEBRIDEMENT OF WOUND Left 02/21/2016   Procedure: MINOR IRRIGATION AND DEBRIDEMENT/REPAIR OF  LEFT ARM WOUND;  Surgeon: Netta Cedars, MD;  Location: [facility];  Service: Orthopedics;  Laterality: Left;   REVERSE SHOULDER ARTHROPLASTY Right 02/21/2016   Procedure: RIGHT REVERSE TOTAL SHOULDER ARTHROPLASTY;  Surgeon: Netta Cedars, MD;  Location: [facility];  Service: Orthopedics;  Laterality: Right;   REVERSE TOTAL SHOULDER ARTHROPLASTY Right 02/21/2016   SAVORY DILATION  03/24/2022   Procedure: SAVORY DILATION;  Surgeon: Montez Morita, Quillian Quince, MD;  [facility];  Service: Gastroenterology;;   SHOULDER ARTHROSCOPY W/ ROTATOR CUFF REPAIR Right 1990s   TOTAL SHOULDER REPLACEMENT     Right   VAGINAL HYSTERECTOMY     Social History:  reports that she quit smoking about 23 years ago. Her smoking use included cigarettes. She has a 30.00 pack-year smoking history. She has never used smokeless tobacco. She reports that she does not drink alcohol and does not use drugs.  Allergies  Allergen Reactions   Niacin And Related Shortness Of Breath, Swelling and Other (See Comments)    Neck; skin hot   Codeine Swelling and Other (  See Comments)    tongue   Zinc Swelling    Family History  Problem Relation Age of Onset   Congestive Heart Failure Mother    Atrial fibrillation Mother    Hiatal hernia Mother    Cancer Father        lung   Heart attack Brother    Stroke Paternal Grandfather    Diabetes Paternal Grandfather    Alzheimer's disease Paternal Grandmother     Prior to Admission medications   Medication Sig Start Date End Date Taking? Authorizing Provider  acetaminophen (TYLENOL) 500 MG tablet Take 500-1,000 mg by mouth every 6 (six) hours as needed (For pain.).    [provider]  albuterol (PROVENTIL HFA;VENTOLIN HFA) 108 (90 Base) MCG/ACT inhaler Inhale 2 puffs into the lungs every 4 (four) hours as needed for wheezing or shortness of breath.    [provider]  ALPRAZolam Duanne Moron) 0.5 MG tablet Take 0.5 mg by mouth daily as needed for anxiety.    [provider]  Biotin 10000 MCG TABS Take 10,000 mcg by mouth daily.     [provider]  Calcium Carbonate-Vitamin D (CALTRATE 600+D PO) Take 1 tablet by mouth daily.    [provider]  celecoxib (CELEBREX) 200 MG capsule Take 200 mg by mouth 2 (two) times daily as needed for moderate pain or mild pain (Back. neck and shoulder). 01/16/22   [provider]  Cholecalciferol (VITAMIN D-3) 125 MCG (5000 UT) TABS Take 5,000 Units by mouth daily.     [provider]  dicyclomine (BENTYL) 10 MG capsule Take 1 capsule (10 mg total) by mouth 2 (two) times daily as needed (irritable bowel syndrome). 04/10/21   Carlan, Chelsea L, NP  docusate sodium (COLACE) 50 MG capsule Take 50 mg by mouth daily as needed for mild constipation or moderate constipation.    [provider]  EPINEPHrine 0.3 mg/0.3 mL IJ SOAJ injection Inject 0.3 mg into the skin as needed for anaphylaxis. 11/21/21   [provider]  escitalopram (LEXAPRO) 20 MG tablet Take 20 mg by mouth daily.    [provider]  esomeprazole (NEXIUM) 40 MG capsule TAKE 1 CAPSULE(40 MG) BY MOUTH TWICE DAILY BEFORE A MEAL 06/01/22   Carlan, Chelsea L, NP  fluticasone (FLONASE) 50 MCG/ACT nasal spray Place 2 sprays into both nostrils daily as needed for allergies or rhinitis.    [provider]  ibuprofen (ADVIL) 200 MG tablet Take 800 mg by mouth every 6 (six) hours as needed for moderate pain.    [provider]  levothyroxine (SYNTHROID) 25 MCG tablet Take 25 mcg by mouth daily before breakfast.    [provider]  loratadine (CLARITIN) 10 MG tablet Take 10 mg by mouth daily.    [provider]  MAGNESIUM PO Take 1 tablet by mouth at bedtime.    [provider]  meclizine (ANTIVERT) 25 MG tablet Take 25 mg by mouth 3 (three) times daily as needed for dizziness. Patient not sure of mg    [provider]  montelukast (SINGULAIR) 10 MG tablet Take 10  mg by mouth every morning.     [provider]  ondansetron (ZOFRAN) 4 MG tablet Take 1 tablet (4 mg total) by mouth every 8 (eight) hours as needed for nausea or vomiting. 03/24/22 03/24/23  Montez Morita, Quillian Quince, MD  rOPINIRole (REQUIP) 2 MG tablet Take 4 mg by mouth at bedtime. 02/25/22   [provider]  traMADol (ULTRAM) 50 MG tablet Take 50-100 mg by mouth every 12 (twelve) hours as needed for severe pain.     [provider]  valACYclovir (VALTREX) 1000 MG tablet Take 1,000 mg by mouth daily. 10/29/16   [provider]    Physical Exam: Vitals:   06/17/22 0630 06/17/22 0700 06/17/22 0730 06/17/22 0800  BP: 109/61 121/66 127/66 123/61  Pulse: 72 65 63 67  Resp: '15 14 14 13  '$ Temp:      TempSrc:      SpO2: 97% 97% 100% 98%  Weight:      Height:       General exam: Alert, awake, oriented x 3 Respiratory system: Clear to auscultation. Respiratory effort normal. Cardiovascular system:RRR. No murmurs, rubs, gallops. Gastrointestinal system: Abdomen is nondistended, soft and nontender. No organomegaly or masses felt. Normal bowel sounds heard. Central nervous system: Alert and oriented. No focal neurological deficits. Extremities: No C/C/E, +pedal pulses Skin: No rashes, lesions or ulcers Psychiatry: Judgement and insight appear normal. Mood & affect appropriate.   Data Reviewed: Complete metabolic panel: AST 606, ALT 46,Sodium 133, potassium 3.9, chloride 97, bicarb 27, BUN 14 and creatinine 0.93 CBC: White blood cell 6.4, hemoglobin 13.0 and platelet count 262 K Urinalysis not demonstrating signs of acute infection -UDS negative. CT head demonstrated no acute intracranial normalities -CT angio head and neck without evidence of large vessel occlusion.  There is the presence of 3 x 5 mm aneurysm at the tip of the basilar artery.    Assessment and Plan: * TIA (transient ischemic attack) - Presenting with transient global amnesia -Concern for  TIA versus stroke -CT head without acute intracranial abnormalities -CT angiogram of head and neck not demonstrating large vessel occlusion -Patient admitted to complete stroke work-up including MRI of the brain, 2D echo and EEG -We will also check LDL, A1c, TSH and B12 -Patient started on baby aspirin 81 mg on daily basis for prevention purposes -Following neurology recommendation if lipid panel abnormal will initiate treatment with Lipitor 40 mg daily. -Follow clinical response and recommendations by neurology service.  Restless leg syndrome -continue the use of requip  Hypothyroidism -Update TSH -Continue the use of Synthroid.  Asthma, chronic - Mild and intermittent -Associated with allergies -Continue as needed bronchodilator management. -Continue the use of Singulair  Depression with anxiety - Stable mood appreciated. -Continue the use of Lexapro -Continue home anxiolytic medication as needed.  Transaminitis - Mild elevation of patient's AST and ALT appreciated -No prior history of liver disease -Fluid resuscitation will be provided -Follow LFTs trend.  Hyperglycemia - No prior history of diabetes -Follow CBGs fluctuation and check A1c  Chronic pain -stable overall -continue home analgesic regimen  Gastroesophageal reflux disease - Continue PPI.  Basilar artery aneurysm -Outpatient follow-up with interventional radiology will be recommended for definitive treatment and appropriate follow-up.     Advance Care Planning:   Code Status: Full Code   Consults: Neurology   Family Communication: no family at bedside  Severity of Illness: The appropriate patient status for this patient is OBSERVATION. Observation status is judged to be reasonable and necessary in order to provide the required intensity of service to ensure the patient's safety. The patient's presenting symptoms, physical exam findings, and initial radiographic and laboratory data in the context of  their medical condition is felt to place them at decreased risk for further clinical deterioration. Furthermore, it is anticipated that the patient will be medically stable for discharge from the hospital within  2 midnights of admission.   Author: Barton Dubois, MD 06/17/2022 9:01 AM  For on call review [email or web address].

## 2022-06-17 NOTE — Assessment & Plan Note (Addendum)
-  Mild elevation of patient's AST and ALT appreciated at time of admission. -After fluid resuscitation provided patient LFTs essentially within normal limits at discharge. -Repeat LFTs at follow-up visit as patient has been initiated on a statin.

## 2022-06-17 NOTE — Assessment & Plan Note (Addendum)
-  continue the use of requip.

## 2022-06-17 NOTE — Assessment & Plan Note (Signed)
-  stable overall -continue home analgesic regimen

## 2022-06-17 NOTE — Assessment & Plan Note (Addendum)
-   Presenting with transient global amnesia and right-sided numbness. -Concern for TIA versus stroke -CT head without acute intracranial abnormalities -CT angiogram of head and neck not demonstrating large vessel occlusion. -MRI without acute intracranial abnormalities. -2D echo demonstrating grade 1 diastolic dysfunction, preserved ejection fraction, no wall motion abnormalities and no thrombi. -EEG without epileptic waves or any other abnormality. -LDL 84, A1c 5.3 -TSH within normal limits; B12 320. -Patient started on baby aspirin 81 mg on daily basis for prevention purposes -Following neurology recommendation patient has been started on Lipitor 20 mg by mouth daily -Outpatient follow-up with neurology requested in 4 weeks. -If patient experience any further episodes or the episodes become more frequent there is recommendation to consider starting zonisamide as it will help with migraines and potential underlying seizure. -Hemodynamically stable and ready for discharge.

## 2022-06-17 NOTE — Progress Notes (Signed)
OT Cancellation Note  Patient Details Name: Kathryn [name] MRN: [id number] DOB: Aug 01, 1956   Cancelled Treatment:    Reason Eval/Treat Not Completed: OT screened, no needs identified, will sign off. MRI negative for acute changes, pt performing ADLs and functional mobility independently. No further OT services required at this time.    Guadelupe Sabin, OTR/L  [phone] 06/17/2022, 4:35 PM

## 2022-06-17 NOTE — Progress Notes (Signed)
SLP Cancellation Note  Patient Details Name: Kathryn [name] MRN: [id number] DOB: 1956/06/16   Cancelled treatment:       Reason Eval/Treat Not Completed: SLP screened, no needs identified, will sign off; SLP screened Pt in room. Pt denies any changes in swallowing, speech, language, or cognition. MRI negative for acute changes. SLE will be deferred at this time. Reconsult if indicated. SLP will sign off.   Thank you,  Genene Churn, [place]    [place] 06/17/2022, 1:19 PM

## 2022-06-17 NOTE — ED Notes (Signed)
Pt to MRI at same time bed was marked ready. Pt will go to inpatient when MRI is completed.

## 2022-06-17 NOTE — Consult Note (Addendum)
I connected with  Kathryn [name] on 06/17/22 by a video enabled telemedicine application and verified that I am speaking with the correct person using two identifiers.   I discussed the limitations of evaluation and management by telemedicine. The patient expressed understanding and agreed to proceed.  Location of patient: [facility] Location of physician: [facility]  Neurology Consultation Reason for Consult: Altered mental status Referring Physician: Dr Barton Dubois  CC: Altered mental status  History is obtained from: Patient, chart review  HPI: Kathryn [name] is a 66 y.o. female with past medical history of vaginal insufficiency, anxiety, restless leg syndrome who presented with transient altered mental status.  Patient states she had dinner and went to use the bathroom at around 1900 on 06/16/2022.  However, she noticed that she could not read her book in the restroom, could not get her words out.  She came out but then could not remember the name of her husband and her children.  She came to the emergency room where she reported numbness on the right side of her face as well as blurry vision.  This episode lasted for about 45 minutes with complete resolution of symptoms.  Telestroke was consulted but at the time of the evaluation, NIHSS was already 0.  Blood pressure was 146/72, blood glucose was 120.  She did report a mild headache.  Patient states she had 4 similar episodes in the past, first 1 was in 2017 while she was at work.  Had another episode in December 2022, 1 more episode since that she cannot remember when.  During all these episodes, she had trouble saying words, remembering things like where she works, what, what she does.  Denies any family history of seizures or epilepsy.  Denies any new medications.  Denies any recent stressors.  ROS: All other systems reviewed and negative except as noted in the HPI.   Past Medical History:  Diagnosis Date   Adrenal  insufficiency ([place])    occured following steroid shoulder injections ~ spring 2017   Anxiety    pt. reports that she has RLS- takes Xanax for calming her self so she can settle & sleep    Arthritis    shoulder & back, knees & leg     Asthma    aleery related   Atypical mole 11/12/2020   mod-severe- mid back   Chest pain, atypical    had cardiac workup - deemed related to anxiety   Chronic bronchitis ([place])    "less since I quit smoking" (02/21/2016)   Chronic lower back pain    Dyspnea    related to spray paint that is put in  beer cans at her place of employment.  Pt. also reports that she has anxiety also that leads to SOB., Pt. reports her Mother just died a week ago     GERD (gastroesophageal reflux disease)    Guillain-Barre syndrome following vaccination ([place]) 1970's   post swine flu shot, hosp. for treatment for 1 month    H/O dizziness    H/O hiatal hernia    Hepatitis    hepatitis as a child; "got it from a little girl at school" make sick and vomiting   Herpes simplex    History of Guillain-Barre syndrome ~ 1978   "from the swine flu shot"   IBS (irritable bowel syndrome)    Pneumonia    PONV (postoperative nausea and vomiting)    Restless leg syndrome  Walking pneumonia ~ 2011    Family History  Problem Relation Age of Onset   Congestive Heart Failure Mother    Atrial fibrillation Mother    Hiatal hernia Mother    Cancer Father        lung   Heart attack Brother    Stroke Paternal Grandfather    Diabetes Paternal Grandfather    Alzheimer's disease Paternal Grandmother     Social History:  reports that she quit smoking about 23 years ago. Her smoking use included cigarettes. She has a 30.00 pack-year smoking history. She has never used smokeless tobacco. She reports that she does not drink alcohol and does not use drugs.   Medications Prior to Admission  Medication Sig Dispense Refill Last Dose   acetaminophen (TYLENOL) 500 MG tablet Take 500-1,000 mg by  mouth every 6 (six) hours as needed (For pain.).   unk   albuterol (PROVENTIL HFA;VENTOLIN HFA) 108 (90 Base) MCG/ACT inhaler Inhale 2 puffs into the lungs every 4 (four) hours as needed for wheezing or shortness of breath.   unk   ALPRAZolam (XANAX) 0.5 MG tablet Take 0.5 mg by mouth daily as needed for anxiety.   unk   Biotin 10000 MCG TABS Take 10,000 mcg by mouth daily.    06/16/2022   Calcium Carbonate-Vitamin D (CALTRATE 600+D PO) Take 1 tablet by mouth daily.   06/16/2022   celecoxib (CELEBREX) 200 MG capsule Take 200 mg by mouth 2 (two) times daily as needed for moderate pain or mild pain (Back. neck and shoulder).   06/16/2022   Cholecalciferol (VITAMIN D-3) 125 MCG (5000 UT) TABS Take 5,000 Units by mouth daily.    06/16/2022   EPINEPHrine 0.3 mg/0.3 mL IJ SOAJ injection Inject 0.3 mg into the skin as needed for anaphylaxis.   unk   escitalopram (LEXAPRO) 20 MG tablet Take 20 mg by mouth daily.   06/16/2022   esomeprazole (NEXIUM) 40 MG capsule TAKE 1 CAPSULE(40 MG) BY MOUTH TWICE DAILY BEFORE A MEAL 60 capsule 2 06/16/2022   fluticasone (FLONASE) 50 MCG/ACT nasal spray Place 2 sprays into both nostrils daily as needed for allergies or rhinitis.   unk   ibuprofen (ADVIL) 200 MG tablet Take 800 mg by mouth every 6 (six) hours as needed for moderate pain.   unk   levothyroxine (SYNTHROID) 25 MCG tablet Take 25 mcg by mouth daily before breakfast.   06/16/2022   loratadine (CLARITIN) 10 MG tablet Take 10 mg by mouth daily.   06/16/2022   montelukast (SINGULAIR) 10 MG tablet Take 10 mg by mouth every morning.    06/16/2022   rOPINIRole (REQUIP) 2 MG tablet Take 2 mg by mouth at bedtime.   Past Week   traMADol (ULTRAM) 50 MG tablet Take 50-100 mg by mouth every 12 (twelve) hours as needed for severe pain.    unk   valACYclovir (VALTREX) 1000 MG tablet Take 1,000 mg by mouth daily.   06/16/2022   dicyclomine (BENTYL) 10 MG capsule Take 1 capsule (10 mg total) by mouth 2 (two) times daily as  needed (irritable bowel syndrome). (Patient not taking: Reported on 06/17/2022) 30 capsule 0 Not Taking   meclizine (ANTIVERT) 25 MG tablet Take 25 mg by mouth 3 (three) times daily as needed for dizziness. Patient not sure of mg (Patient not taking: Reported on 06/17/2022)   Not Taking   ondansetron (ZOFRAN) 4 MG tablet Take 1 tablet (4 mg total) by mouth every 8 (eight) hours as  needed for nausea or vomiting. (Patient not taking: Reported on 06/17/2022) 30 tablet 2 Not Taking      Exam: Current vital signs: BP 136/71 (BP Location: Right Arm)   Pulse 66   Temp 98 F (36.7 C) (Oral)   Resp 17   Ht '5\' 7"'$  (1.702 m)   Wt 63.5 kg   SpO2 99%   BMI 21.93 kg/m  Vital signs in last 24 hours: Temp:  [97.9 F (36.6 C)-98 F (36.7 C)] 98 F (36.7 C) (10/11 1039) Pulse Rate:  [63-72] 66 (10/11 1039) Resp:  [12-17] 17 (10/11 1039) BP: (109-157)/(60-94) 136/71 (10/11 1039) SpO2:  [96 %-100 %] 99 % (10/11 1039) Weight:  [63.5 kg] 63.5 kg (10/10 2116)   Physical Exam  Constitutional: Appears well-developed and well-nourished.  Psych: Affect appropriate to situation Eyes: No scleral injection Neuro: AOx3, cranial nerves II to XII appear grossly intact, antigravity strength without drift in all extremities, FTN intact bilaterally.  I have reviewed labs in epic and the results pertinent to this consultation are: CBC:  Recent Labs  Lab 06/16/22 2136  WBC 6.4  NEUTROABS 3.2  HGB 13.0  HCT 37.4  MCV 95.2  PLT 809    Basic Metabolic Panel:  Lab Results  Component Value Date   NA 133 (L) 06/16/2022   K 3.9 06/16/2022   CO2 27 06/16/2022   GLUCOSE 117 (H) 06/16/2022   BUN 14 06/16/2022   CREATININE 0.93 06/16/2022   CALCIUM 9.6 06/16/2022   GFRNONAA >60 06/16/2022   GFRAA >60 11/27/2016   Lipid Panel: No results found for: "[place]" HgbA1c: No results found for: "HGBA1C" Urine Drug Screen:     Component Value Date/Time   LABOPIA NONE DETECTED 06/16/2022 [place] DETECTED 06/16/2022 2342   LABBENZ NONE DETECTED 06/16/2022 2342   AMPHETMU NONE DETECTED 06/16/2022 2342   THCU NONE DETECTED 06/16/2022 2342   LABBARB NONE DETECTED 06/16/2022 2342    Alcohol Level No results found for: "ETH"  I have reviewed the images obtained:  CT head without contrast 06/16/2022: No acute intracranial abnormality. CT angio head and neck with and without contrast 06/16/2022: No emergent large vessel occlusion or high-grade stenosis of the intracranial arteries. 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 MRI brain without contrast 06/17/2022: No evidence of acute intracranial abnormality.    ASSESSMENT/PLAN: 66 year old female who presented with transient global amnesia, MRI brain did not show any acute abnormality.  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  Transient global amnesia Basilar artery aneurysm -Differentials for this episode includes stroke (due to blurry vision and right facial numbness), migraine with aura versus seizure (however, 4 episodes in 5 years with any medications is unlikely due to epilepsy)  Recommendations: -Would recommend aspirin 81 mg daily -Lipid panel ordered and pending.  If LDL more than 65, can start atorvastatin 40 mg daily -If TTE within normal limits (absence of thrombus and PFO), recommend 30-day event monitor - EEG ordered and pending. If normal, will not start Aeds due to low suspicion for epilepsy -Recommend outpatient follow-up with interventional neurology for basilar artery aneurysm -If episodes recur or become more frequent, can consider starting zonisamide as it would help with migraine, seizure -Discussed plan with patient at bedside, Dr. Dyann Kief via secure chat -Recommend follow-up with neurology in 3 months   Thank you for allowing [place] to participate in the care of  this patient. If you have any further questions, please contact  me or neurohospitalist.   Zeb Comfort [facility] neurohospitalist

## 2022-06-17 NOTE — Assessment & Plan Note (Addendum)
-  No prior history of diabetes -A1c 5.3 -Patient instructed to to watch carbohydrate diet.

## 2022-06-17 NOTE — Assessment & Plan Note (Addendum)
-  Stable mood appreciated -Continue home anxiolytic medication as needed.

## 2022-06-17 NOTE — Assessment & Plan Note (Addendum)
-  TSH within normal limits. -Continue the use of Synthroid.

## 2022-06-17 NOTE — Procedures (Signed)
Patient Name: Kathryn [name]  MRN: [id number]  Epilepsy Attending: Lora Havens  Referring Physician/Provider: Barton Dubois, MD  Date: 06/17/2022 Duration: 33.02 mins  Patient history: 66 year old female with transient altered mental status.  EEG [place] for seizure.  Level of alertness: Awake, asleep  AEDs during EEG study: None  Technical aspects: This EEG study was done with scalp electrodes positioned according to the 10-20 International system of electrode placement. Electrical activity was reviewed with band pass filter of 1-'70Hz'$ , sensitivity of 7 uV/mm, display speed of 60m/sec with a '60Hz'$  notched filter applied as appropriate. EEG data were recorded continuously and digitally stored.  Video monitoring was available and reviewed as appropriate.  Description: The posterior dominant rhythm consists of 9-10 Hz activity of moderate voltage (25-35 uV) seen predominantly in posterior head regions, symmetric and reactive to eye opening and eye closing. Sleep was characterized by vertex waves, sleep spindles (12 to 14 Hz), maximal frontocentral region.  Hyperventilation and photic stimulation were not performed.     IMPRESSION: This study is within normal limits. No seizures or epileptiform discharges were seen throughout the recording.  A normal interictal EEG does not exclude the diagnosis of epilepsy.   Kathryn [name] [name]

## 2022-06-17 NOTE — Assessment & Plan Note (Addendum)
-   Mild and intermittent -Associated with allergies -Continue as needed bronchodilator management. -Continue the use of Singulair

## 2022-06-18 DIAGNOSIS — R739 Hyperglycemia, unspecified: Secondary | ICD-10-CM | POA: Diagnosis not present

## 2022-06-18 DIAGNOSIS — G459 Transient cerebral ischemic attack, unspecified: Secondary | ICD-10-CM

## 2022-06-18 DIAGNOSIS — F32A Depression, unspecified: Secondary | ICD-10-CM | POA: Diagnosis present

## 2022-06-18 DIAGNOSIS — E039 Hypothyroidism, unspecified: Secondary | ICD-10-CM | POA: Diagnosis not present

## 2022-06-18 DIAGNOSIS — K219 Gastro-esophageal reflux disease without esophagitis: Secondary | ICD-10-CM | POA: Diagnosis not present

## 2022-06-18 DIAGNOSIS — F419 Anxiety disorder, unspecified: Secondary | ICD-10-CM | POA: Diagnosis not present

## 2022-06-18 DIAGNOSIS — R7401 Elevation of levels of liver transaminase levels: Secondary | ICD-10-CM | POA: Diagnosis not present

## 2022-06-18 DIAGNOSIS — G2581 Restless legs syndrome: Secondary | ICD-10-CM | POA: Diagnosis not present

## 2022-06-18 DIAGNOSIS — G8929 Other chronic pain: Secondary | ICD-10-CM | POA: Diagnosis not present

## 2022-06-18 DIAGNOSIS — J4520 Mild intermittent asthma, uncomplicated: Secondary | ICD-10-CM | POA: Diagnosis not present

## 2022-06-18 LAB — COMPREHENSIVE METABOLIC PANEL
ALT: 29 U/L (ref 0–44)
AST: 54 U/L — ABNORMAL HIGH (ref 15–41)
Albumin: 3.2 g/dL — ABNORMAL LOW (ref 3.5–5.0)
Alkaline Phosphatase: 44 U/L (ref 38–126)
Anion gap: 5 (ref 5–15)
BUN: 11 mg/dL (ref 8–23)
CO2: 26 mmol/L (ref 22–32)
Calcium: 8.4 mg/dL — ABNORMAL LOW (ref 8.9–10.3)
Chloride: 105 mmol/L (ref 98–111)
Creatinine, Ser: 0.81 mg/dL (ref 0.44–1.00)
GFR, Estimated: >60 mL/min (ref >60)
Glucose, Bld: 91 mg/dL (ref 70–99)
Potassium: 3.9 mmol/L (ref 3.5–5.1)
Sodium: 136 mmol/L (ref 135–145)
Total Bilirubin: 0.5 mg/dL (ref 0.3–1.2)
Total Protein: 5.9 g/dL — ABNORMAL LOW (ref 6.5–8.1)

## 2022-06-18 LAB — LIPID PANEL
Cholesterol: 152 mg/dL (ref 0–200)
HDL: 52 mg/dL (ref >40)
LDL Cholesterol: 84 mg/dL (ref 0–99)
Total CHOL/HDL Ratio: 2.9 RATIO
Triglycerides: 82 mg/dL (ref <150)
VLDL: 16 mg/dL (ref 0–40)

## 2022-06-18 LAB — HIV ANTIBODY (ROUTINE TESTING W REFLEX): HIV Screen 4th Generation wRfx: NONREACTIVE

## 2022-06-18 MED ORDER — ASPIRIN 81 MG PO TBEC: 81.0000 mg | DELAYED_RELEASE_TABLET | Freq: Every day | ORAL | 5 refills | Status: DC

## 2022-06-18 MED ORDER — ATORVASTATIN CALCIUM 20 MG PO TABS: 20.0000 mg | ORAL_TABLET | Freq: Every day | ORAL | 1 refills | Status: DC

## 2022-06-18 MED ORDER — ATORVASTATIN CALCIUM 20 MG PO TABS
20.0000 mg | ORAL_TABLET | Freq: Every day | ORAL | Status: DC
  Administered 2022-06-18: 20 mg via ORAL
  Filled 2022-06-18: qty 1

## 2022-06-18 MED ORDER — VITAMIN B-12 1000 MCG PO TABS: 1000.0000 ug | ORAL_TABLET | Freq: Every day | ORAL | 2 refills | Status: AC

## 2022-06-18 NOTE — Discharge Summary (Signed)
Physician Discharge Summary   Patient: Kathryn [name] MRN: [id number] DOB: [birth year]  Admit date:     06/16/2022  Discharge date: 06/18/22  Discharge Physician: Barton Dubois   PCP: [facility]   Recommendations at discharge:  Repeat complete metabolic panel to follow electrolytes, renal function and LFTs. Reassess blood pressure and if needed initiate treatment with antihypertensive agents. Make sure patient has follow-up with neurology service as instructed.  Discharge Diagnoses: Principal Problem:   TIA (transient ischemic attack) Active Problems:   Gastroesophageal reflux disease   Chronic pain   Hyperglycemia   Transaminitis   Anxiety   Asthma, chronic   Hypothyroidism   Restless leg syndrome   Hospital Course: Kathryn [name] is a 66 y.o. female with medical history significant of with past medical history of hypothyroidism, anxiety/depression, gastroesophageal reflux disease, history of asthma, restless leg syndrome chronic pain; who presented to the hospital secondary to altered mental status.  Patient reports last seen normal around 7 PM on 06/16/2022; she went to the bathroom and noticing difficulties trying to read her book as otherwise she normally will be able to do.  Patient noticed difficulty getting words out and when interacting with family was unable to remember the name of her husband and her children.  Patient reported having some right-sided numbness sensation associated headache.  After discussing with her husband the symptoms patient was brought to the emergency department for further evaluation and management.  Overall symptoms lasted about 45 minutes.  Patient presented past 4 hours after the incident.  She reported experiencing similar episodes in the past but the previous one did not last as long as the current event.   Patient reports no chest pain, no nausea, no vomiting, no focal motor deficits, no fever or chills, no sick contacts or  any other complaints.   In the ED work-up demonstrated no signs of acute infection, CT scan of the head without acute intracranial abnormalities and CT angio of the head and neck without large vessel obstruction.  Case discussed with teleneurology who recommended admission for TIA work-up.  TRH consulted to place patient in the hospital for further evaluation and management.  Assessment and Plan: * TIA (transient ischemic attack) - Presenting with transient global amnesia and right-sided numbness. -Concern for TIA versus stroke -CT head without acute intracranial abnormalities -CT angiogram of head and neck not demonstrating large vessel occlusion. -MRI without acute intracranial abnormalities. -2D echo demonstrating grade 1 diastolic dysfunction, preserved ejection fraction, no wall motion abnormalities and no thrombi. -EEG without epileptic waves or any other abnormality. -LDL 84, A1c 5.3 -TSH within normal limits; B12 320. -Patient started on baby aspirin 81 mg on daily basis for prevention purposes -Following neurology recommendation patient has been started on Lipitor 20 mg by mouth daily -Outpatient follow-up with neurology requested in 4 weeks. -If patient experience any further episodes or the episodes become more frequent there is recommendation to consider starting zonisamide as it will help with migraines and potential underlying seizure. -Hemodynamically stable and ready for discharge.  Depression -Mood overall stable -Continue treatment with Lexapro.  Restless leg syndrome -continue the use of requip.  Hypothyroidism -TSH within normal limits. -Continue the use of Synthroid.  Asthma, chronic -Mild and intermittent -Associated with allergies -Continue as needed bronchodilator management. -Continue the use of Singulair  Anxiety -Stable mood appreciated -Continue home anxiolytic medication as needed.  Transaminitis -Mild elevation of patient's AST and ALT  appreciated at time of admission. -After fluid resuscitation  provided patient LFTs essentially within normal limits at discharge. -Repeat LFTs at follow-up visit as patient has been initiated on a statin.  Hyperglycemia -No prior history of diabetes -A1c 5.3 -Patient instructed to to watch carbohydrate diet.  Chronic pain -stable overall -continue home analgesic regimen  Gastroesophageal reflux disease -Continue PPI.  Grade 1 diastolic dysfunction (chronic and compensated) -Continue low-sodium diet -Maintain adequate hydration and follow daily weights.  Consultants: Neurology Procedures performed: See below for x-ray reports; EEG and 2D echo. Disposition: Home Diet recommendation: Heart healthy/modified carbohydrate diet.  DISCHARGE MEDICATION: Allergies as of 06/18/2022       Reactions   Niacin And Related Shortness Of Breath, Swelling, Other (See Comments)   Neck; skin hot   Codeine Swelling, Other (See Comments)   tongue   Zinc Swelling        Medication List     STOP taking these medications    dicyclomine 10 MG capsule Commonly known as: BENTYL   meclizine 25 MG tablet Commonly known as: ANTIVERT   ondansetron 4 MG tablet Commonly known as: Zofran       TAKE these medications    acetaminophen 500 MG tablet Commonly known as: TYLENOL Take 500-1,000 mg by mouth every 6 (six) hours as needed (For pain.).   albuterol 108 (90 Base) MCG/ACT inhaler Commonly known as: VENTOLIN HFA Inhale 2 puffs into the lungs every 4 (four) hours as needed for wheezing or shortness of breath.   ALPRAZolam 0.5 MG tablet Commonly known as: XANAX Take 0.5 mg by mouth daily as needed for anxiety.   aspirin EC 81 MG tablet Take 1 tablet (81 mg total) by mouth daily. Swallow whole. Start taking on: June 19, 2022   atorvastatin 20 MG tablet Commonly known as: LIPITOR Take 1 tablet (20 mg total) by mouth daily. Start taking on: June 19, 2022   Biotin 10000  MCG Tabs Take 10,000 mcg by mouth daily.   CALTRATE 600+D PO Take 1 tablet by mouth daily.   celecoxib 200 MG capsule Commonly known as: CELEBREX Take 200 mg by mouth 2 (two) times daily as needed for moderate pain or mild pain (Back. neck and shoulder).   cyanocobalamin 1000 MCG tablet Commonly known as: VITAMIN B12 Take 1 tablet (1,000 mcg total) by mouth daily.   EPINEPHrine 0.3 mg/0.3 mL Soaj injection Commonly known as: EPI-PEN Inject 0.3 mg into the skin as needed for anaphylaxis.   escitalopram 20 MG tablet Commonly known as: LEXAPRO Take 20 mg by mouth daily.   esomeprazole 40 MG capsule Commonly known as: NEXIUM TAKE 1 CAPSULE(40 MG) BY MOUTH TWICE DAILY BEFORE A MEAL   fluticasone 50 MCG/ACT nasal spray Commonly known as: FLONASE Place 2 sprays into both nostrils daily as needed for allergies or rhinitis.   ibuprofen 200 MG tablet Commonly known as: ADVIL Take 800 mg by mouth every 6 (six) hours as needed for moderate pain.   levothyroxine 25 MCG tablet Commonly known as: SYNTHROID Take 25 mcg by mouth daily before breakfast.   loratadine 10 MG tablet Commonly known as: CLARITIN Take 10 mg by mouth daily.   montelukast 10 MG tablet Commonly known as: SINGULAIR Take 10 mg by mouth every morning.   rOPINIRole 2 MG tablet Commonly known as: REQUIP Take 2 mg by mouth at bedtime.   traMADol 50 MG tablet Commonly known as: ULTRAM Take 50-100 mg by mouth every 12 (twelve) hours as needed for severe pain.   valACYclovir 1000 MG tablet Commonly  known as: VALTREX Take 1,000 mg by mouth daily.   Vitamin D-3 125 MCG (5000 UT) Tabs Take 5,000 Units by mouth daily.        Follow-up Information     [facility] [employer] [facility]. Schedule an appointment as soon as possible for a visit in 10 day(s).   Specialty: Family Medicine Contact information: [address] [place] [address] [phone]         Phillips Odor, MD. Schedule  an appointment as soon as possible for a visit in 4 week(s).   Specialty: Neurology Contact information: [address] [address] [phone]                Discharge Exam: Danley Danker Weights   06/16/22 2116  Weight: 63.5 kg   General exam: Alert, awake, oriented x 3 Respiratory system: Clear to auscultation. Respiratory effort normal. Cardiovascular system:RRR. No murmurs, rubs, gallops. Gastrointestinal system: Abdomen is nondistended, soft and nontender. No organomegaly or masses felt. Normal bowel sounds heard. Central nervous system: Alert and oriented. No focal neurological deficits. Extremities: No C/C/E, +pedal pulses Skin: No rashes, lesions or ulcers Psychiatry: Judgement and insight appear normal. Mood & affect appropriate.    Condition at discharge: Stable and improved.  The results of significant diagnostics from this hospitalization (including imaging, microbiology, ancillary and laboratory) are listed below for reference.   Imaging Studies: ECHOCARDIOGRAM COMPLETE  Result Date: 06/17/2022    ECHOCARDIOGRAM REPORT   Patient Name:   Kathryn [name] Date of Exam: 06/17/2022 Medical Rec #:  [id number]      Height:       67.0 in Accession #:    [id number]     Weight:       140.0 lb Date of Birth:  Jul 10, 1956     BSA:          1.738 m Patient Age:    32 years       BP:           118/70 mmHg Patient Gender: F              HR:           66 bpm. Exam Location:  [facility] Procedure: 2D Echo, Cardiac Doppler and Color Doppler Indications:    TIA  History:        Patient has no prior history of Echocardiogram examinations.                 Signs/Symptoms:Dyspnea; Risk Factors:GERD.  Sonographer:    Bernadene Person RDCS Referring Phys: [place]  1. Left ventricular ejection fraction, by estimation, is 55 to 60%. The left ventricle has normal function. The left ventricle has no regional wall motion abnormalities. Left ventricular diastolic parameters are  consistent with Grade I diastolic dysfunction (impaired relaxation).  2. Right ventricular systolic function is low normal. The right ventricular size is normal. There is normal pulmonary artery systolic pressure.  3. The mitral valve is grossly normal. Trivial mitral valve regurgitation.  4. The aortic valve is tricuspid. Aortic valve regurgitation is not visualized.  5. The inferior vena cava is normal in size with greater than 50% respiratory variability, suggesting right atrial pressure of 3 mmHg. Comparison(s): No prior Echocardiogram. FINDINGS  Left Ventricle: Left ventricular ejection fraction, by estimation, is 55 to 60%. The left ventricle has normal function. The left ventricle has no regional wall motion abnormalities. The left ventricular internal cavity size was normal in size. There is  no left ventricular hypertrophy. Left ventricular diastolic parameters are consistent with Grade I diastolic dysfunction (impaired relaxation). Indeterminate filling pressures. Right Ventricle: The right ventricular size is normal. No increase in right ventricular wall thickness. Right ventricular systolic function is low normal. There is normal pulmonary artery systolic pressure. The tricuspid regurgitant velocity is 1.82 m/s,  and with an assumed right atrial pressure of 3 mmHg, the estimated right ventricular systolic pressure is 36.1 mmHg. Left Atrium: Left atrial size was normal in size. Right Atrium: Right atrial size was normal in size. Pericardium: There is no evidence of pericardial effusion. Mitral Valve: The mitral valve is grossly normal. Trivial mitral valve regurgitation. Tricuspid Valve: The tricuspid valve is grossly normal. Tricuspid valve regurgitation is trivial. Aortic Valve: The aortic valve is tricuspid. Aortic valve regurgitation is not visualized. Pulmonic Valve: The pulmonic valve was normal in structure. Pulmonic valve regurgitation is not visualized. Aorta: The aortic root and ascending aorta  are structurally normal, with no evidence of dilitation. Venous: The inferior vena cava is normal in size with greater than 50% respiratory variability, suggesting right atrial pressure of 3 mmHg. IAS/Shunts: No atrial level shunt detected by color flow Doppler.  LEFT VENTRICLE PLAX 2D LVIDd:         5.30 cm     Diastology LVIDs:         3.50 cm     LV e' medial:    5.59 cm/s LV PW:         0.60 cm     LV E/e' medial:  15.5 LV IVS:        0.70 cm     LV e' lateral:   11.10 cm/s LVOT diam:     1.80 cm     LV E/e' lateral: 7.8 LV SV:         54 LV SV Index:   31 LVOT Area:     2.54 cm  LV Volumes (MOD) LV vol d, MOD A2C: 79.2 ml LV vol d, MOD A4C: 86.7 ml LV vol s, MOD A2C: 38.1 ml LV vol s, MOD A4C: 43.9 ml LV SV MOD A2C:     41.1 ml LV SV MOD A4C:     86.7 ml LV SV MOD BP:      42.0 ml RIGHT VENTRICLE RV S prime:     10.40 cm/s TAPSE (M-mode): 1.8 cm LEFT ATRIUM             Index        RIGHT ATRIUM           Index LA diam:        3.00 cm 1.73 cm/m   RA Area:     11.30 cm LA Vol (A2C):   31.7 ml 18.24 ml/m  RA Volume:   26.00 ml  14.96 ml/m LA Vol (A4C):   38.1 ml 21.92 ml/m LA Biplane Vol: 36.7 ml 21.12 ml/m  AORTIC VALVE LVOT Vmax:   93.60 cm/s LVOT Vmean:  63.500 cm/s LVOT VTI:    0.214 m  AORTA Ao Root diam: 3.40 cm Ao Asc diam:  3.20 cm MITRAL VALVE               TRICUSPID VALVE MV Area (PHT): 3.42 cm    TR Peak grad:   13.2 mmHg MV Decel Time: 222 msec    TR Vmax:        182.00 cm/s MV E velocity: 86.50 cm/s MV A velocity: 84.00 cm/s  SHUNTS  MV E/A ratio:  1.03        Systemic VTI:  0.21 m                            Systemic Diam: 1.80 cm Lyman Bishop MD Electronically signed by Lyman Bishop MD Signature Date/Time: 06/17/2022/4:24:25 PM    Final    EEG adult  Result Date: 06/17/2022 Lora Havens, MD     06/17/2022  1:23 PM Patient Name: Kathryn [name] MRN: [id number] Epilepsy Attending: Lora Havens Referring Physician/Provider: Barton Dubois, MD Date: 06/17/2022 Duration: 33.02 mins  Patient history: 66 year old female with transient altered mental status.  EEG Dro[place] for seizure. Level of alertness: Awake, asleep AEDs during EEG study: None Technical aspects: This EEG study was done with scalp electrodes positioned according to the 10-20 International system of electrode placement. Electrical activity was reviewed with band pass filter of 1-'70Hz'$ , sensitivity of 7 uV/mm, display speed of 10m/sec with a '60Hz'$  notched filter applied as appropriate. EEG data were recorded continuously and digitally stored.  Video monitoring was available and reviewed as appropriate. Description: The posterior dominant rhythm consists of 9-10 Hz activity of moderate voltage (25-35 uV) seen predominantly in posterior head regions, symmetric and reactive to eye opening and eye closing. Sleep was characterized by vertex waves, sleep spindles (12 to 14 Hz), maximal frontocentral region.  Hyperventilation and photic stimulation were not performed.   IMPRESSION: This study is within normal limits. No seizures or epileptiform discharges were seen throughout the recording. A normal interictal EEG does not exclude the diagnosis of epilepsy. PLora Havens  MR BRAIN WO CONTRAST  Result Date: 06/17/2022 CLINICAL DATA:  Neuro deficit, acute, stroke suspected EXAM: MRI HEAD WITHOUT CONTRAST TECHNIQUE: Multiplanar, multiecho pulse sequences of the brain and surrounding structures were obtained without intravenous contrast. COMPARISON:  CT head October 10, 23. FINDINGS: Brain: No acute infarction, hemorrhage, hydrocephalus, extra-axial collection or mass lesion. Vascular: Major arterial flow voids are maintained at the skull base. Skull and upper cervical spine: Normal marrow signal. Partially imaged ACDF. Sinuses/Orbits: Paranasal sinus mucosal thickening. No acute orbital findings. Other: No mastoid effusions. IMPRESSION: No evidence of acute intracranial abnormality. Electronically Signed   By: FMargaretha SheffieldM.D.    On: 06/17/2022 08:51   CT ANGIO HEAD NECK W WO CM (CODE STROKE)  Result Date: 06/16/2022 CLINICAL DATA:  Acute neurologic deficit EXAM: CT ANGIOGRAPHY HEAD AND NECK TECHNIQUE: Multidetector CT imaging of the head and neck was performed using the standard protocol during bolus administration of intravenous contrast. Multiplanar CT image reconstructions and MIPs were obtained to evaluate the vascular anatomy. Carotid stenosis measurements (when applicable) are obtained utilizing NASCET criteria, using the distal internal carotid diameter as the denominator. RADIATION DOSE REDUCTION: This exam was performed according to the departmental dose-optimization program which includes automated exposure control, adjustment of the mA and/or kV according to patient size and/or use of iterative reconstruction technique. CONTRAST:  106mOMNIPAQUE IOHEXOL 350 MG/ML SOLN COMPARISON:  None Available. FINDINGS: CTA NECK FINDINGS SKELETON: There is no bony spinal canal stenosis. No lytic or blastic lesion. Multilevel cervical anterior fusion. OTHER NECK: Normal pharynx, larynx and major salivary glands. No cervical lymphadenopathy. Unremarkable thyroid gland. UPPER CHEST: No pneumothorax or pleural effusion. No nodules or masses. AORTIC ARCH: There is no calcific atherosclerosis of the aortic arch. There is no aneurysm, dissection or hemodynamically significant stenosis of the visualized portion of the aorta. Conventional 3 vessel  aortic branching pattern. The visualized proximal subclavian arteries are widely patent. RIGHT CAROTID SYSTEM: Normal without aneurysm, dissection or stenosis. LEFT CAROTID SYSTEM: Normal without aneurysm, dissection or stenosis. VERTEBRAL ARTERIES: Left dominant configuration. Both origins are clearly patent. There is no dissection, occlusion or flow-limiting stenosis to the skull base (V1-V3 segments). CTA HEAD FINDINGS POSTERIOR CIRCULATION: --Vertebral arteries: Normal V4 segments. --Inferior  cerebellar arteries: Normal. --Basilar artery: 3 x 5 mm aneurysm at the tip of the basilar artery. --Superior cerebellar arteries: Normal. --Posterior cerebral arteries (PCA): Normal. ANTERIOR CIRCULATION: --Intracranial internal carotid arteries: Normal. --Anterior cerebral arteries (ACA): Normal. Both A1 segments are present. Patent anterior communicating artery (a-comm). --Middle cerebral arteries (MCA): Normal. VENOUS SINUSES: As permitted by contrast timing, patent. ANATOMIC VARIANTS: Fetal origin of the right posterior cerebral artery. Review of the MIP images confirms the above findings. IMPRESSION: 1. No emergent large vessel occlusion or high-grade stenosis of the intracranial arteries. 2. 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 Electronically Signed   By: Ulyses Jarred M.D.   On: 06/16/2022 22:09   CT HEAD CODE STROKE WO CONTRAST  Result Date: 06/16/2022 CLINICAL DATA:  Code stroke.  Aphasia EXAM: CT HEAD WITHOUT CONTRAST TECHNIQUE: Contiguous axial images were obtained from the base of the skull through the vertex without intravenous contrast. RADIATION DOSE REDUCTION: This exam was performed according to the departmental dose-optimization program which includes automated exposure control, adjustment of the mA and/or kV according to patient size and/or use of iterative reconstruction technique. COMPARISON:  None Available. FINDINGS: Brain: There is no mass, hemorrhage or extra-axial collection. The size and configuration of the ventricles and extra-axial CSF spaces are normal. The brain parenchyma is normal, without evidence of acute or chronic infarction. Vascular: No abnormal hyperdensity of the major intracranial arteries or dural venous sinuses. No intracranial atherosclerosis. Skull: The visualized skull base, calvarium and extracranial soft tissues are normal. Sinuses/Orbits: No fluid levels or advanced mucosal thickening of the visualized paranasal sinuses. No mastoid or middle  ear effusion. The orbits are normal. ASPECTS [facility] Stroke Program Early CT Score) - Ganglionic level infarction (caudate, lentiform nuclei, internal capsule, insula, M1-M3 cortex): 7 - Supraganglionic infarction (M4-M6 cortex): 3 Total score (0-10 with 10 being normal): 10 IMPRESSION: 1. No acute intracranial abnormality. 2. ASPECTS is 10. These results were called by telephone at the time of interpretation on 06/16/2022 at 9:59 pm to provider [facility] , who verbally acknowledged these results. Electronically Signed   By: Ulyses Jarred M.D.   On: 06/16/2022 21:59    Microbiology: Results for orders placed or performed in visit on 09/13/20  Novel Coronavirus, NAA ([facility])     Status: Abnormal   Collection Time: 09/13/20  1:01 PM   Specimen: Nasopharyngeal(NP) swabs in vial transport medium   Nasopharynge  Result Value Ref Range Status   SARS-CoV-2, NAA Detected (A) Not Detected Final    Comment: Patients who have a positive COVID-19 test result may now have treatment options. Treatment options are available for patients with mild to moderate symptoms and for hospitalized patients. Visit our website at [email or web address] for resources and information. This nucleic acid amplification test was developed and its performance characteristics determined by [employer]. Nucleic acid amplification tests include RT-PCR and TMA. This test has not been FDA cleared or approved. This test has been authorized by FDA under an Emergency Use Authorization (EUA). This test is only authorized for the duration of time the declaration that circumstances exist justifying  the authorization of the emergency use of in vitro diagnostic tests for detection of SARS-CoV-2 virus and/or diagnosis of COVID-19 infection under section 564(b)(1) of the Act, 21 U.S.C. 282KSH-3(G) (1), unless the authorization is terminated or revoked sooner. When diagnostic testing is negativ e, the possibility  of a false negative result should be considered in the context of a patient's recent exposures and the presence of clinical signs and symptoms consistent with COVID-19. An individual without symptoms of COVID-19 and who is not shedding SARS-CoV-2 virus would expect to have a negative (not detected) result in this assay.     Labs: CBC: Recent Labs  Lab 06/16/22 2136  WBC 6.4  NEUTROABS 3.2  HGB 13.0  HCT 37.4  MCV 95.2  PLT 871   Basic Metabolic Panel: Recent Labs  Lab 06/16/22 2136 06/18/22 0459  NA 133* 136  K 3.9 3.9  CL 97* 105  CO2 27 26  GLUCOSE 117* 91  BUN 14 11  CREATININE 0.93 0.81  CALCIUM 9.6 8.4*   Liver Function Tests: Recent Labs  Lab 06/16/22 2136 06/18/22 0459  AST 116* 54*  ALT 46* 29  ALKPHOS 63 44  BILITOT 0.6 0.5  PROT 7.8 5.9*  ALBUMIN 4.3 3.2*   CBG: Recent Labs  Lab 06/16/22 2124  GLUCAP 120*    Discharge time spent: greater than 30 minutes.  Signed: Barton Dubois, MD [facility] 06/18/2022

## 2022-06-18 NOTE — TOC Progression Note (Signed)
  Transition of Care [facility]) Screening Note   Patient Details  Name: Kathryn [name] Date of Birth: [birth year]   Transition of Care [facility]) CM/SW Contact:    Boneta Lucks, RN Phone Number: 06/18/2022, 10:55 AM  No PT follow-up   Transition of Care Department [facility]) has reviewed patient and no TOC needs have been identified at this time. We will continue to monitor patient advancement through interdisciplinary progression rounds. If new patient transition needs arise, please place a TOC consult.     Expected Discharge Plan: Home/Self Care    Expected Discharge Plan and Services Expected Discharge Plan: Home/Self Care       Living arrangements for the past 2 months: [place]

## 2022-06-18 NOTE — Care Management Obs Status (Signed)
[place] NOTIFICATION   Patient Details  Name: Kathryn [name] MRN: [id number] Date of Birth: [birth year]   Medicare Observation Status Notification Given:  Yes    Tommy Medal 06/18/2022, 2:29 PM

## 2022-06-18 NOTE — TOC Progression Note (Deleted)
Transition of Care [facility]) - Progression Note    Patient Details  Name: Kathryn [name] MRN: [id number] Date of Birth: Feb 15, 1956  Transition of Care [facility]) CM/SW Contact  Boneta Lucks, RN Phone Number: 06/18/2022, 10:58 AM  Clinical Narrative:   TOC following, continuing palliative discussions. [facility] updated.   Expected Discharge Plan: Home/Self Care Barriers to Discharge: Continued Medical Work up, Family Issues, Barriers Unresolved (comment) (continuing palliatie discussion. Patient combative with dialysis)  Expected Discharge Plan and Services Expected Discharge Plan: Home/Self Care       Living arrangements for the past 2 months: [place]

## 2022-06-18 NOTE — Progress Notes (Signed)
Patient slept on and off, no complaints of pain this shift. Patient ambulated to the restroom without any difficulty.

## 2022-06-18 NOTE — Assessment & Plan Note (Signed)
-  Mood overall stable -Continue treatment with Lexapro.

## 2022-06-18 NOTE — Care Management Obs Status (Signed)
[facility] NOTIFICATION   Patient Details  Name: Kathryn [name] MRN: [id number] Date of Birth: 1956/01/17   Medicare Observation Status Notification Given:       Tommy Medal 06/18/2022, 2:27 PM

## 2022-06-19 ENCOUNTER — Telehealth: Payer: Self-pay

## 2022-06-19 DIAGNOSIS — J301 Allergic rhinitis due to pollen: Secondary | ICD-10-CM | POA: Diagnosis not present

## 2022-06-19 DIAGNOSIS — J3081 Allergic rhinitis due to animal (cat) (dog) hair and dander: Secondary | ICD-10-CM | POA: Diagnosis not present

## 2022-06-19 DIAGNOSIS — G459 Transient cerebral ischemic attack, unspecified: Secondary | ICD-10-CM

## 2022-06-19 DIAGNOSIS — J3089 Other allergic rhinitis: Secondary | ICD-10-CM | POA: Diagnosis not present

## 2022-06-19 NOTE — Telephone Encounter (Signed)
Enrolled, will be mailed to pt's home

## 2022-06-19 NOTE — Telephone Encounter (Signed)
-----   Message from Erma Heritage, [place] sent at 06/18/2022  4:16 PM EDT ----- Regarding: 30-day monitor Hi! This patient needs a 30-day monitor for TIA per Dr. Dyann Kief.   Thanks,  [place]

## 2022-06-22 ENCOUNTER — Encounter (INDEPENDENT_AMBULATORY_CARE_PROVIDER_SITE_OTHER): Payer: Self-pay | Admitting: Gastroenterology

## 2022-06-23 DIAGNOSIS — J301 Allergic rhinitis due to pollen: Secondary | ICD-10-CM | POA: Diagnosis not present

## 2022-06-23 DIAGNOSIS — J3089 Other allergic rhinitis: Secondary | ICD-10-CM | POA: Diagnosis not present

## 2022-06-23 DIAGNOSIS — J3081 Allergic rhinitis due to animal (cat) (dog) hair and dander: Secondary | ICD-10-CM | POA: Diagnosis not present

## 2022-06-29 ENCOUNTER — Ambulatory Visit (INDEPENDENT_AMBULATORY_CARE_PROVIDER_SITE_OTHER): Payer: PPO | Admitting: Gastroenterology

## 2022-06-30 DIAGNOSIS — J301 Allergic rhinitis due to pollen: Secondary | ICD-10-CM | POA: Diagnosis not present

## 2022-06-30 DIAGNOSIS — J3081 Allergic rhinitis due to animal (cat) (dog) hair and dander: Secondary | ICD-10-CM | POA: Diagnosis not present

## 2022-06-30 DIAGNOSIS — J3089 Other allergic rhinitis: Secondary | ICD-10-CM | POA: Diagnosis not present

## 2022-07-02 DIAGNOSIS — G459 Transient cerebral ischemic attack, unspecified: Secondary | ICD-10-CM | POA: Diagnosis not present

## 2022-07-02 DIAGNOSIS — Z6822 Body mass index (BMI) 22.0-22.9, adult: Secondary | ICD-10-CM | POA: Diagnosis not present

## 2022-07-03 DIAGNOSIS — G464 Cerebellar stroke syndrome: Secondary | ICD-10-CM | POA: Diagnosis not present

## 2022-07-03 DIAGNOSIS — G459 Transient cerebral ischemic attack, unspecified: Secondary | ICD-10-CM | POA: Diagnosis not present

## 2022-07-07 ENCOUNTER — Ambulatory Visit: Payer: PPO | Attending: Internal Medicine

## 2022-07-07 DIAGNOSIS — G459 Transient cerebral ischemic attack, unspecified: Secondary | ICD-10-CM

## 2022-07-08 DIAGNOSIS — J3089 Other allergic rhinitis: Secondary | ICD-10-CM | POA: Diagnosis not present

## 2022-07-08 DIAGNOSIS — J301 Allergic rhinitis due to pollen: Secondary | ICD-10-CM | POA: Diagnosis not present

## 2022-07-08 DIAGNOSIS — J3081 Allergic rhinitis due to animal (cat) (dog) hair and dander: Secondary | ICD-10-CM | POA: Diagnosis not present

## 2022-07-14 DIAGNOSIS — J301 Allergic rhinitis due to pollen: Secondary | ICD-10-CM | POA: Diagnosis not present

## 2022-07-14 DIAGNOSIS — J3089 Other allergic rhinitis: Secondary | ICD-10-CM | POA: Diagnosis not present

## 2022-07-14 DIAGNOSIS — J3081 Allergic rhinitis due to animal (cat) (dog) hair and dander: Secondary | ICD-10-CM | POA: Diagnosis not present

## 2022-07-18 ENCOUNTER — Encounter (INDEPENDENT_AMBULATORY_CARE_PROVIDER_SITE_OTHER): Payer: Self-pay | Admitting: Gastroenterology

## 2022-07-24 DIAGNOSIS — J205 Acute bronchitis due to respiratory syncytial virus: Secondary | ICD-10-CM | POA: Diagnosis not present

## 2022-07-24 DIAGNOSIS — J0100 Acute maxillary sinusitis, unspecified: Secondary | ICD-10-CM | POA: Diagnosis not present

## 2022-07-24 DIAGNOSIS — G2581 Restless legs syndrome: Secondary | ICD-10-CM | POA: Diagnosis not present

## 2022-07-24 DIAGNOSIS — K219 Gastro-esophageal reflux disease without esophagitis: Secondary | ICD-10-CM | POA: Diagnosis not present

## 2022-07-28 DIAGNOSIS — J3081 Allergic rhinitis due to animal (cat) (dog) hair and dander: Secondary | ICD-10-CM | POA: Diagnosis not present

## 2022-07-28 DIAGNOSIS — J3089 Other allergic rhinitis: Secondary | ICD-10-CM | POA: Diagnosis not present

## 2022-07-28 DIAGNOSIS — J301 Allergic rhinitis due to pollen: Secondary | ICD-10-CM | POA: Diagnosis not present

## 2022-08-04 DIAGNOSIS — J3081 Allergic rhinitis due to animal (cat) (dog) hair and dander: Secondary | ICD-10-CM | POA: Diagnosis not present

## 2022-08-04 DIAGNOSIS — J3089 Other allergic rhinitis: Secondary | ICD-10-CM | POA: Diagnosis not present

## 2022-08-04 DIAGNOSIS — J301 Allergic rhinitis due to pollen: Secondary | ICD-10-CM | POA: Diagnosis not present

## 2022-08-12 ENCOUNTER — Telehealth: Payer: Self-pay | Admitting: Neurology

## 2022-08-12 ENCOUNTER — Ambulatory Visit: Payer: PPO | Admitting: Neurology

## 2022-08-12 ENCOUNTER — Encounter: Payer: Self-pay | Admitting: Neurology

## 2022-08-12 VITALS — BP 121/71 | HR 73 | Ht 65.0 in | Wt 145.0 lb

## 2022-08-12 DIAGNOSIS — R569 Unspecified convulsions: Secondary | ICD-10-CM | POA: Diagnosis not present

## 2022-08-12 DIAGNOSIS — G459 Transient cerebral ischemic attack, unspecified: Secondary | ICD-10-CM | POA: Diagnosis not present

## 2022-08-12 DIAGNOSIS — I725 Aneurysm of other precerebral arteries: Secondary | ICD-10-CM | POA: Diagnosis not present

## 2022-08-12 NOTE — Patient Instructions (Signed)
Continue current medications Referral to interventional radiology regarding a basilar aneurysm Ambulatory EEG to rule out seizures Follow-up in 1 year

## 2022-08-12 NOTE — Progress Notes (Signed)
[facility]  PATIENT: Kathryn [name] DOB: [birth year]  REQUESTING CLINICIAN: Sharilyn Sites, MD HISTORY FROM: Patient and husband  REASON FOR VISIT: TIA/ Episodes of confusion    HISTORICAL  CHIEF COMPLAINT:  Chief Complaint  Patient presents with   New Patient (Initial Visit)    Rm 12 with husband here for consult on TIA. Pt reports on 06/16/2022 she had TIA like event. Pt reports this was not her first event (total of 4 she can recall). During the ER visit CT scan completed and     HISTORY OF PRESENT ILLNESS:  This is a 66 year old woman past medical history of anxiety/depression, GERD, restless leg syndrome, hypothyroidism who is presenting after being seen in the hospital for TIA. Patient reports presenting to the ED with TIA due to episode of altered mental status.  She reports that she was in the bathroom but could not read, the words were not coming out and she could not understand the words.  She got out of the bathroom and did not recognize her husband.  Husband was stating that she was asking who he was, and who her son was.  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  Upon discharge from the hospital she also have a 30-day cardiac monitor, she sent back to monitor but resolved were not communicated to  Patient report in the past, she had 3 episodes where she had acute confusion, words not coming out, and sometimes, the episodes may be associated with headaches. She states during these episodes, she can not think straight.  In the hospital, she had a routine EEG which was negative for any abnormal discharge.   Hospital Course: Kathryn [name] is a 66 y.o. female with medical history significant of with past medical history of hypothyroidism, anxiety/depression, gastroesophageal reflux disease, history of asthma, restless leg syndrome chronic pain; who  presented to the hospital secondary to altered mental status.  Patient reports last seen normal around 7 PM on 06/16/2022; she went to the bathroom and noticing difficulties trying to read her book as otherwise she normally will be able to do.  Patient noticed difficulty getting words out and when interacting with family was unable to remember the name of her husband and her children.  Patient reported having some right-sided numbness sensation associated headache.  After discussing with her husband the symptoms patient was brought to the emergency department for further evaluation and management.  Overall symptoms lasted about 45 minutes.  Patient presented past 4 hours after the incident.  She reported experiencing similar episodes in the past but the previous one did not last as long as the current event.   Patient reports no chest pain, no nausea, no vomiting, no focal motor deficits, no fever or chills, no sick contacts or any other complaints.   In the ED work-up demonstrated no signs of acute infection, CT scan of the head without acute intracranial abnormalities and CT angio of the head and neck without large vessel obstruction.  Case discussed with teleneurology who recommended admission for TIA work-up.  TRH consulted to place patient in the hospital for further evaluation and management.  OTHER MEDICAL CONDITIONS: Anxiety/depression, GERD, restless leg syndrome, hypothyroidism   REVIEW OF SYSTEMS: Full 14 system review of systems performed and negative with exception of: As noted in the HPI   ALLERGIES: Allergies  Allergen Reactions   Niacin  And Related Shortness Of Breath, Swelling and Other (See Comments)    Neck; skin hot   Codeine Swelling and Other (See Comments)    tongue   Zinc Swelling    HOME MEDICATIONS: Outpatient Medications Prior to Visit  Medication Sig Dispense Refill   acetaminophen (TYLENOL) 500 MG tablet Take 500-1,000 mg by mouth every 6 (six) hours as needed  (For pain.).     albuterol (PROVENTIL HFA;VENTOLIN HFA) 108 (90 Base) MCG/ACT inhaler Inhale 2 puffs into the lungs every 4 (four) hours as needed for wheezing or shortness of breath.     ALPRAZolam (XANAX) 0.5 MG tablet Take 0.5 mg by mouth daily as needed for anxiety.     aspirin EC 81 MG tablet Take 1 tablet (81 mg total) by mouth daily. Swallow whole. 30 tablet 5   atorvastatin (LIPITOR) 20 MG tablet Take 1 tablet (20 mg total) by mouth daily. 30 tablet 1   Biotin 10000 MCG TABS Take 10,000 mcg by mouth daily.      Calcium Carbonate-Vitamin D (CALTRATE 600+D PO) Take 1 tablet by mouth daily.     celecoxib (CELEBREX) 200 MG capsule Take 200 mg by mouth 2 (two) times daily as needed for moderate pain or mild pain (Back. neck and shoulder).     Cholecalciferol (VITAMIN D-3) 125 MCG (5000 UT) TABS Take 5,000 Units by mouth daily.      cyanocobalamin (VITAMIN B12) 1000 MCG tablet Take 1 tablet (1,000 mcg total) by mouth daily. 30 tablet 2   EPINEPHrine 0.3 mg/0.3 mL IJ SOAJ injection Inject 0.3 mg into the skin as needed for anaphylaxis.     escitalopram (LEXAPRO) 20 MG tablet Take 20 mg by mouth daily.     esomeprazole (NEXIUM) 40 MG capsule TAKE 1 CAPSULE(40 MG) BY MOUTH TWICE DAILY BEFORE A MEAL 60 capsule 2   fluticasone (FLONASE) 50 MCG/ACT nasal spray Place 2 sprays into both nostrils daily as needed for allergies or rhinitis.     ibuprofen (ADVIL) 200 MG tablet Take 800 mg by mouth every 6 (six) hours as needed for moderate pain.     levothyroxine (SYNTHROID) 25 MCG tablet Take 25 mcg by mouth daily before breakfast.     loratadine (CLARITIN) 10 MG tablet Take 10 mg by mouth daily.     montelukast (SINGULAIR) 10 MG tablet Take 10 mg by mouth every morning.      rOPINIRole (REQUIP) 2 MG tablet Take 2 mg by mouth at bedtime.     traMADol (ULTRAM) 50 MG tablet Take 50-100 mg by mouth every 12 (twelve) hours as needed for severe pain.      valACYclovir (VALTREX) 1000 MG tablet Take 1,000 mg by  mouth daily.     No facility-administered medications prior to visit.    PAST MEDICAL HISTORY: Past Medical History:  Diagnosis Date   Adrenal insufficiency ([place])    occured following steroid shoulder injections ~ spring 2017   Anxiety    pt. reports that she has RLS- takes Xanax for calming her self so she can settle & sleep    Arthritis    shoulder & back, knees & leg     Asthma    aleery related   Atypical mole 11/12/2020   mod-severe- mid back   Chest pain, atypical    had cardiac workup - deemed related to anxiety   Chronic bronchitis ([place])    "less since I quit smoking" (02/21/2016)   Chronic lower back pain  Dyspnea    related to spray paint that is put in  beer cans at her place of employment.  Pt. also reports that she has anxiety also that leads to SOB., Pt. reports her Mother just died a week ago     GERD (gastroesophageal reflux disease)    Guillain-Barre syndrome following vaccination ([place]) 1970's   post swine flu shot, hosp. for treatment for 1 month    H/O dizziness    H/O hiatal hernia    Hepatitis    hepatitis as a child; "got it from a little girl at school" make sick and vomiting   Herpes simplex    History of Guillain-Barre syndrome ~ 1978   "from the swine flu shot"   IBS (irritable bowel syndrome)    Pneumonia    PONV (postoperative nausea and vomiting)    Restless leg syndrome    Walking pneumonia ~ 2011    PAST SURGICAL HISTORY: Past Surgical History:  Procedure Laterality Date   90 HOUR [place] STUDY N/A 09/23/2016   Procedure: 24 HOUR [place] STUDY;  Surgeon: Mauri Pole, MD;  [facility]: [facility];  Service: Endoscopy;  Laterality: N/A;   ANTERIOR CERVICAL DECOMP/DISCECTOMY FUSION  2000 & 2015   two times ago- C5 and C6, Dr. Otilio Connors, MD 2015   APPENDECTOMY  1980s   BACK SURGERY     BIOPSY  03/24/2022   Procedure: BIOPSY;  Surgeon: Harvel Quale, MD;  [facility];  Service: Gastroenterology;;   CHOLECYSTECTOMY  OPEN  1980s   COLONOSCOPY N/A 09/20/2014   [place]: Prep was excellent except she had thick layer of stool coating cecal and ascending colon mucosa. Cecal landmarks were well identified after vigorous washing. Small lymphoid polyp ablated via cold biopsy from appendiceal stump.Mucosa of rest of the colon was normal. Normal mucosa of rectum and anorectal junction.   ESOPHAGEAL DILATION N/A 05/29/2019   Procedure: ESOPHAGEAL DILATION;  Surgeon: Rogene Houston, MD;  [facility];  Service: Endoscopy;  Laterality: N/A;   ESOPHAGEAL MANOMETRY N/A 09/23/2016   Procedure: ESOPHAGEAL MANOMETRY (EM);  Surgeon: Mauri Pole, MD;  [facility];  Service: Endoscopy;  Laterality: N/A;   ESOPHAGOGASTRODUODENOSCOPY N/A 09/20/2014   Procedure: ESOPHAGOGASTRODUODENOSCOPY (EGD);  Surgeon: Rogene Houston, MD;  [facility];  Service: Endoscopy;  Laterality: N/A;   ESOPHAGOGASTRODUODENOSCOPY (EGD) WITH PROPOFOL N/A 05/29/2019   - Z-line, 38 cm from the incisors.   ESOPHAGOGASTRODUODENOSCOPY (EGD) WITH PROPOFOL N/A 03/24/2022   Procedure: ESOPHAGOGASTRODUODENOSCOPY (EGD) WITH PROPOFOL;  Surgeon: Harvel Quale, MD;  [facility];  Service: Gastroenterology;  Laterality: N/A;  730 ASA 1   HIATAL HERNIA REPAIR N/A 11/27/2016   Procedure: LAPAROSCOPIC REPAIR OF HIATAL HERNIA WITH NISSEN FUNDOPLICATION;  Surgeon: Jackolyn Confer, MD;  [facility]: [facility];  Service: General;  Laterality: N/A;   INCISION AND DRAINAGE OF WOUND Left 02/21/2016   forearm, minor   LUMBAR LAMINECTOMY/DECOMPRESSION MICRODISCECTOMY Right 06/22/2013   Procedure: RIGHT LUMBAR TWO-THREE DISCECTOMY;  Surgeon: Otilio Connors, MD;  Location: [facility];  Service: Neurosurgery;  Laterality: Right;  Right    MALONEY DILATION N/A 09/20/2014   Procedure: Venia Minks DILATION;  Surgeon: Rogene Houston, MD;  [facility];  Service: Endoscopy;  Laterality: N/A;   MINOR IRRIGATION AND  DEBRIDEMENT OF WOUND Left 02/21/2016   Procedure: MINOR IRRIGATION AND DEBRIDEMENT/REPAIR OF LEFT ARM WOUND;  Surgeon: Netta Cedars, MD;  Location: [facility];  Service: Orthopedics;  Laterality: Left;   REVERSE SHOULDER ARTHROPLASTY Right 02/21/2016   Procedure: RIGHT REVERSE TOTAL SHOULDER ARTHROPLASTY;  Surgeon: Netta Cedars, MD;  Location: [facility];  Service: Orthopedics;  Laterality: Right;   REVERSE TOTAL SHOULDER ARTHROPLASTY Right 02/21/2016   SAVORY DILATION  03/24/2022   Procedure: SAVORY DILATION;  Surgeon: Montez Morita, Quillian Quince, MD;  [facility];  Service: Gastroenterology;;   SHOULDER ARTHROSCOPY W/ ROTATOR CUFF REPAIR Right 1990s   TOTAL SHOULDER REPLACEMENT     Right   VAGINAL HYSTERECTOMY      FAMILY HISTORY: Family History  Problem Relation Age of Onset   Congestive Heart Failure Mother    Atrial fibrillation Mother    Hiatal hernia Mother    Cancer Father        lung   Heart attack Brother    Stroke Paternal Grandfather    Diabetes Paternal Grandfather    Alzheimer's disease Paternal Grandmother     SOCIAL HISTORY: Social History   Socioeconomic History   Marital status: Married    Spouse name: Not on file   Number of children: Not on file   Years of education: Not on file   Highest education level: Not on file  Occupational History   Occupation: Therapist, sports  Tobacco Use   Smoking status: Former    Packs/day: 3.00    Years: 10.00    Total pack years: 30.00    Types: Cigarettes    Quit date: 05/09/1999    Years since quitting: 23.2   Smokeless tobacco: Never   Tobacco comments:    "quit smoking in ~ 2003"  Vaping Use   Vaping Use: Never used  Substance and Sexual Activity   Alcohol use: No    Comment: 02/21/2016 "I used to drink a little bit; probably quit in my 20s"   Drug use: No   Sexual activity: Yes    Birth control/protection: Surgical    Comment: hyst  Other Topics Concern   Not on file  Social History Narrative   Walks but no  other activity    Anxiety   Social Determinants of Health   Financial Resource Strain: Not on file  Food Insecurity: Not on file  Transportation Needs: Not on file  Physical Activity: Not on file  Stress: Not on file  Social Connections: Not on file  Intimate Partner Violence: Not on file    PHYSICAL EXAM  GENERAL EXAM/CONSTITUTIONAL: Vitals:  Vitals:   08/12/22 1407  BP: 121/71  Pulse: 73  Weight: 145 lb (65.8 kg)  Height: '5\' 5"'$  (1.651 m)   Body mass index is 24.13 kg/m. Wt Readings from Last 3 Encounters:  08/12/22 145 lb (65.8 kg)  06/16/22 140 lb (63.5 kg)  03/19/22 142 lb (64.4 kg)   Patient is in no distress; well developed, nourished and groomed; neck is supple  EYES: Visual fields full to confrontation, Extraocular movements intacts,   MUSCULOSKELETAL: Gait, strength, tone, movements noted in Neurologic exam below  NEUROLOGIC: MENTAL STATUS:      No data to display         awake, alert, oriented to person, place and time recent and remote memory intact normal attention and concentration language fluent, comprehension intact, naming intact fund of knowledge appropriate  CRANIAL NERVE:  2nd, 3rd, 4th, 6th - Visual fields full to confrontation, extraocular muscles intact, no nystagmus 5th - facial sensation symmetric 7th - facial strength symmetric 8th - hearing intact 9th - palate elevates symmetrically, uvula midline 11th -  shoulder shrug symmetric 12th - tongue protrusion midline  MOTOR:  normal bulk and tone, full strength in the BUE, BLE  SENSORY:  normal and symmetric to light touch  COORDINATION:  finger-nose-finger, fine finger movements normal  REFLEXES:  deep tendon reflexes present and symmetric  GAIT/STATION:  normal   DIAGNOSTIC DATA (LABS, IMAGING, TESTING) - I reviewed patient records, labs, notes, testing and imaging myself where available.  Lab Results  Component Value Date   WBC 6.4 06/16/2022   HGB 13.0  06/16/2022   HCT 37.4 06/16/2022   MCV 95.2 06/16/2022   PLT 262 06/16/2022      Component Value Date/Time   NA 136 06/18/2022 0459   K 3.9 06/18/2022 0459   CL 105 06/18/2022 0459   CO2 26 06/18/2022 0459   GLUCOSE 91 06/18/2022 0459   BUN 11 06/18/2022 0459   CREATININE 0.81 06/18/2022 0459   CREATININE 0.93 04/28/2019 1347   CALCIUM 8.4 (L) 06/18/2022 0459   PROT 5.9 (L) 06/18/2022 0459   ALBUMIN 3.2 (L) 06/18/2022 0459   AST 54 (H) 06/18/2022 0459   ALT 29 06/18/2022 0459   ALKPHOS 44 06/18/2022 0459   BILITOT 0.5 06/18/2022 0459   GFRNONAA >60 06/18/2022 0459   GFRAA >60 11/27/2016 2107   Lab Results  Component Value Date   CHOL 152 06/18/2022   HDL 52 06/18/2022   LDLCALC 84 06/18/2022   TRIG 82 06/18/2022   CHOLHDL 2.9 06/18/2022   Lab Results  Component Value Date   HGBA1C 5.3 06/17/2022   Lab Results  Component Value Date   ZOXWRUEA54 098 06/17/2022   Lab Results  Component Value Date   TSH 0.682 06/17/2022    MRI Brain 06/17/2022 No evidence of acute intracranial abnormality   Head CT 06/16/2022 1. No acute intracranial abnormality. 2. ASPECTS is 10  CTA Head and Neck 06/16/2022 1. No emergent large vessel occlusion or high-grade stenosis of the intracranial arteries. 2. 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  Routine EEG 06/17/2022 Normal   ASSESSMENT AND PLAN  66 y.o. year old female with anxiety/depression, GERD, hypothyroidism, restless leg syndrome who is presenting after an episode of acute confusion lasted about 45 minutes.  Patient had a total of 4 episodes in the past, she reports during these times, she cannot think straight, words are not coming out and she is confused.  She does not have any stroke risk factors such as diabetes, hypertension or hyperlipidemia.  Her LDL was 84.  At this time I will continue patient on current medications including aspirin, Lipitor.  For her aneurysm I will refer her to interventional  radiology or neurosurgery for further evaluation.  Even though her routine EEG was normal, seizures are still high on the differential.  I will obtain a 48-hour ambulatory EEG.  I will contact the patient to go over the result otherwise I will see her in 1 year for follow-up.  Did advise her to contact me if she has a breakthrough event.  She voices understanding.   1. TIA (transient ischemic attack)   2. Seizure-like activity ([place])   3. Basilar artery aneurysm [facility])      Patient Instructions  Continue current medications Referral to interventional radiology regarding a basilar aneurysm Ambulatory EEG to rule out seizures Follow-up in 1 year  Orders Placed This Encounter  Procedures   Ambulatory referral to Neurosurgery    No orders of the defined types were placed in  this encounter.   Return in about 1 year (around 08/13/2023).  I have spent a total of 60 minutes dedicated to this patient today, preparing to see patient, performing a medically appropriate examination and evaluation, ordering tests and/or medications and procedures, and counseling and educating the patient/family/caregiver; independently interpreting result and communicating results to the family/patient/caregiver; and documenting clinical information in the electronic medical record.   Alric Ran, MD 08/12/2022, 3:01 PM  [facility] [address] [place], [place] [address] [phone]

## 2022-08-12 NOTE — Telephone Encounter (Signed)
Referral for Neurosurgery fax to [facility]. Phone: [phone], Fax: [phone]

## 2022-08-14 DIAGNOSIS — J3081 Allergic rhinitis due to animal (cat) (dog) hair and dander: Secondary | ICD-10-CM | POA: Diagnosis not present

## 2022-08-14 DIAGNOSIS — J301 Allergic rhinitis due to pollen: Secondary | ICD-10-CM | POA: Diagnosis not present

## 2022-08-14 DIAGNOSIS — J3089 Other allergic rhinitis: Secondary | ICD-10-CM | POA: Diagnosis not present

## 2022-08-19 DIAGNOSIS — I671 Cerebral aneurysm, nonruptured: Secondary | ICD-10-CM | POA: Diagnosis not present

## 2022-08-19 DIAGNOSIS — J3081 Allergic rhinitis due to animal (cat) (dog) hair and dander: Secondary | ICD-10-CM | POA: Diagnosis not present

## 2022-08-19 DIAGNOSIS — J3089 Other allergic rhinitis: Secondary | ICD-10-CM | POA: Diagnosis not present

## 2022-08-19 DIAGNOSIS — J301 Allergic rhinitis due to pollen: Secondary | ICD-10-CM | POA: Diagnosis not present

## 2022-08-24 ENCOUNTER — Ambulatory Visit (INDEPENDENT_AMBULATORY_CARE_PROVIDER_SITE_OTHER): Payer: PPO | Admitting: Gastroenterology

## 2022-08-24 ENCOUNTER — Encounter (INDEPENDENT_AMBULATORY_CARE_PROVIDER_SITE_OTHER): Payer: Self-pay | Admitting: Gastroenterology

## 2022-08-24 VITALS — BP 110/69 | HR 85 | Temp 97.5°F | Ht 67.0 in | Wt 145.7 lb

## 2022-08-24 DIAGNOSIS — R7989 Other specified abnormal findings of blood chemistry: Secondary | ICD-10-CM | POA: Insufficient documentation

## 2022-08-24 DIAGNOSIS — R110 Nausea: Secondary | ICD-10-CM

## 2022-08-24 DIAGNOSIS — R1319 Other dysphagia: Secondary | ICD-10-CM | POA: Diagnosis not present

## 2022-08-24 DIAGNOSIS — K581 Irritable bowel syndrome with constipation: Secondary | ICD-10-CM | POA: Diagnosis not present

## 2022-08-24 NOTE — Patient Instructions (Addendum)
If persistent nausea, consider proceeding with gastric emptying study - please notify us if willing to schedule this Try to chew thoroughly and eat small pieces Start taking Miralax 1 capful every day for one week. If bowel movements do not improve, increase to 2 capfuls every day. If after two weeks there is no improvement, increase to 2 capfuls in AM and one at night. Will recheck CMP in next appointment

## 2022-08-24 NOTE — Progress Notes (Unsigned)
Kathryn [name], M.D. Gastroenterology & Hepatology [facility] [address] [place], [place] [address]  Primary Care Physician: Sharilyn Sites, MD [address] [place] [id number]  I will communicate my assessment and recommendations to the referring MD via EMR.  Problems: Chronic nausea IBS Chronic dysphagia  History of Present Illness: Kathryn [name] is a 66 y.o. female with PMH anxiety, asthma, GERD, Guillian Barre syndrome, IBS, who presents for follow up of nausea and IBS.  The patient was last seen on 02/10/2022. At that time, the patient had a phone visit.  She was scheduled for an EGD for evaluation of persistent nausea and was started on esomeprazole 40 mg twice a day.  EGD was performed on 03/24/2022 which showed a 1 cm hiatal hernia and presence of a prior Nissen fundoplication with a loose wrap, no other abnormalities were found in the esophageal lumen.  The esophagus was empirically dilated with an 18 mm Savary dilator.  Biopsies mid and distal esophagus were unremarkable.  Patient was started on Zofran as needed.  Patient reports that she is still presenting nighttime recurrent nausea. She reports having some nausea during the day but it is worse at night. She reports that as she has presented significant heaving at night, but cannot vomit as she had a Nissen fundoplication in the past. She reports having these symptoms 3 years.  Insurance did not cover the Zofran and she has not tried any medication for nausea.  Also feels that some of the food such as . She reports that she has had dysphagia for multiple years, even before having her Nissen. She reports that she had a manometry in the past prior to undergoing her surgery. Had esophagram in 2020 showing possible related esophageal dysmotility.  Sometimes can have some cramping in her mid abdomen. She takes dicyclomine as needed for abdominal pain but this makes more  constipated.  Patient reports that she is having Bristol 1 stools, has 1 - 2 small bowel movements per day. She is taking tramadol 50 mg for back pain as needed, but sometimes takes ibuprofen. Does not take any laxatives.  Last TSH 0.68 in 06/2022.   Did not notice any difference between Nexium once a day vs twice a day. She has presented heartburn possibly once a week.  The patient denies having any fever, chills, hematochezia, melena, hematemesis, abdominal distention, diarrhea, jaundice, pruritus or weight loss.  Last EGD: as above Last Colonoscopy: 2016 Prep was excellent except she had thick layer of stool coating cecal and ascending colon mucosa. Cecal landmarks were well identified after vigorous washing. Small polyp ablated via cold biopsy from appendiceal stump. Mucosa of rest of the colon was normal. Normal mucosa of rectum and anorectal junction  Past Medical History: Past Medical History:  Diagnosis Date   Adrenal insufficiency ([place])    occured following steroid shoulder injections ~ spring 2017   Anxiety    pt. reports that she has RLS- takes Xanax for calming her self so she can settle & sleep    Arthritis    shoulder & back, knees & leg     Asthma    aleery related   Atypical mole 11/12/2020   mod-severe- mid back   Chest pain, atypical    had cardiac workup - deemed related to anxiety   Chronic bronchitis ([place])    "less since I quit smoking" (02/21/2016)   Chronic lower back pain    Dyspnea    related  to spray paint that is put in  beer cans at her place of employment.  Pt. also reports that she has anxiety also that leads to SOB., Pt. reports her Mother just died a week ago     GERD (gastroesophageal reflux disease)    Guillain-Barre syndrome following vaccination ([place]) 1970's   post swine flu shot, hosp. for treatment for 1 month    H/O dizziness    H/O hiatal hernia    Hepatitis    hepatitis as a child; "got it from a little girl at school" make sick and  vomiting   Herpes simplex    History of Guillain-Barre syndrome ~ 1978   "from the swine flu shot"   IBS (irritable bowel syndrome)    Pneumonia    PONV (postoperative nausea and vomiting)    Restless leg syndrome    Walking pneumonia ~ 2011    Past Surgical History: Past Surgical History:  Procedure Laterality Date   64 HOUR [place] STUDY N/A 09/23/2016   Procedure: 24 HOUR [place] STUDY;  Surgeon: Mauri Pole, MD;  Location: [facility];  Service: Endoscopy;  Laterality: N/A;   ANTERIOR CERVICAL DECOMP/DISCECTOMY FUSION  2000 & 2015   two times ago- C5 and C6, Dr. Otilio Connors, MD 2015   APPENDECTOMY  1980s   BACK SURGERY     BIOPSY  03/24/2022   Procedure: BIOPSY;  Surgeon: Harvel Quale, MD;  [facility]: [facility];  Service: Gastroenterology;;   CHOLECYSTECTOMY OPEN  1980s   COLONOSCOPY N/A 09/20/2014   rehman: Prep was excellent except she had thick layer of stool coating cecal and ascending colon mucosa. Cecal landmarks were well identified after vigorous washing. Small lymphoid polyp ablated via cold biopsy from appendiceal stump.Mucosa of rest of the colon was normal. Normal mucosa of rectum and anorectal junction.   ESOPHAGEAL DILATION N/A 05/29/2019   Procedure: ESOPHAGEAL DILATION;  Surgeon: Rogene Houston, MD;  [facility];  Service: Endoscopy;  Laterality: N/A;   ESOPHAGEAL MANOMETRY N/A 09/23/2016   Procedure: ESOPHAGEAL MANOMETRY (EM);  Surgeon: Mauri Pole, MD;  [facility]: [facility];  Service: Endoscopy;  Laterality: N/A;   ESOPHAGOGASTRODUODENOSCOPY N/A 09/20/2014   Procedure: ESOPHAGOGASTRODUODENOSCOPY (EGD);  Surgeon: Rogene Houston, MD;  [facility];  Service: Endoscopy;  Laterality: N/A;   ESOPHAGOGASTRODUODENOSCOPY (EGD) WITH PROPOFOL N/A 05/29/2019   - Z-line, 38 cm from the incisors.   ESOPHAGOGASTRODUODENOSCOPY (EGD) WITH PROPOFOL N/A 03/24/2022   Procedure: ESOPHAGOGASTRODUODENOSCOPY (EGD) WITH PROPOFOL;   Surgeon: Harvel Quale, MD;  [facility];  Service: Gastroenterology;  Laterality: N/A;  730 ASA 1   HIATAL HERNIA REPAIR N/A 11/27/2016   Procedure: LAPAROSCOPIC REPAIR OF HIATAL HERNIA WITH NISSEN FUNDOPLICATION;  Surgeon: Jackolyn Confer, MD;  Location: [facility];  Service: General;  Laterality: N/A;   INCISION AND DRAINAGE OF WOUND Left 02/21/2016   forearm, minor   LUMBAR LAMINECTOMY/DECOMPRESSION MICRODISCECTOMY Right 06/22/2013   Procedure: RIGHT LUMBAR TWO-THREE DISCECTOMY;  Surgeon: Otilio Connors, MD;  Location: [facility];  Service: Neurosurgery;  Laterality: Right;  Right    MALONEY DILATION N/A 09/20/2014   Procedure: Venia Minks DILATION;  Surgeon: Rogene Houston, MD;  [facility];  Service: Endoscopy;  Laterality: N/A;   MINOR IRRIGATION AND DEBRIDEMENT OF WOUND Left 02/21/2016   Procedure: MINOR IRRIGATION AND DEBRIDEMENT/REPAIR OF LEFT ARM WOUND;  Surgeon: Netta Cedars, MD;  Location: [facility];  Service: Orthopedics;  Laterality: Left;   REVERSE  SHOULDER ARTHROPLASTY Right 02/21/2016   Procedure: RIGHT REVERSE TOTAL SHOULDER ARTHROPLASTY;  Surgeon: Netta Cedars, MD;  Location: [facility];  Service: Orthopedics;  Laterality: Right;   REVERSE TOTAL SHOULDER ARTHROPLASTY Right 02/21/2016   SAVORY DILATION  03/24/2022   Procedure: SAVORY DILATION;  Surgeon: Montez Morita, Quillian Quince, MD;  [facility];  Service: Gastroenterology;;   SHOULDER ARTHROSCOPY W/ ROTATOR CUFF REPAIR Right 1990s   TOTAL SHOULDER REPLACEMENT     Right   VAGINAL HYSTERECTOMY      Family History: Family History  Problem Relation Age of Onset   Congestive Heart Failure Mother    Atrial fibrillation Mother    Hiatal hernia Mother    Cancer Father        lung   Heart attack Brother    Stroke Paternal Grandfather    Diabetes Paternal Grandfather    Alzheimer's disease Paternal Grandmother     Social History: Social History   Tobacco Use  Smoking Status Former    Packs/day: 3.00   Years: 10.00   Total pack years: 30.00   Types: Cigarettes   Quit date: 05/09/1999   Years since quitting: 23.3  Smokeless Tobacco Never  Tobacco Comments   "quit smoking in ~ 2003"   Social History   Substance and Sexual Activity  Alcohol Use No   Comment: 02/21/2016 "I used to drink a little bit; probably quit in my 50s"   Social History   Substance and Sexual Activity  Drug Use No    Allergies: Allergies  Allergen Reactions   Niacin And Related Shortness Of Breath, Swelling and Other (See Comments)    Neck; skin hot   Codeine Swelling and Other (See Comments)    tongue   Zinc Swelling    Medications: Current Outpatient Medications  Medication Sig Dispense Refill   acetaminophen (TYLENOL) 500 MG tablet Take 500-1,000 mg by mouth every 6 (six) hours as needed (For pain.).     albuterol (PROVENTIL HFA;VENTOLIN HFA) 108 (90 Base) MCG/ACT inhaler Inhale 2 puffs into the lungs every 4 (four) hours as needed for wheezing or shortness of breath.     ALPRAZolam (XANAX) 0.5 MG tablet Take 0.5 mg by mouth daily as needed for anxiety.     Biotin 10000 MCG TABS Take 10,000 mcg by mouth daily.      Calcium Carbonate-Vitamin D (CALTRATE 600+D PO) Take 1 tablet by mouth daily.     celecoxib (CELEBREX) 200 MG capsule Take 200 mg by mouth 2 (two) times daily as needed for moderate pain or mild pain (Back. neck and shoulder).     Cholecalciferol (VITAMIN D-3) 125 MCG (5000 UT) TABS Take 5,000 Units by mouth daily.      cyanocobalamin (VITAMIN B12) 1000 MCG tablet Take 1 tablet (1,000 mcg total) by mouth daily. 30 tablet 2   EPINEPHrine 0.3 mg/0.3 mL IJ SOAJ injection Inject 0.3 mg into the skin as needed for anaphylaxis.     escitalopram (LEXAPRO) 20 MG tablet Take 20 mg by mouth daily.     esomeprazole (NEXIUM) 40 MG capsule TAKE 1 CAPSULE(40 MG) BY MOUTH TWICE DAILY BEFORE A MEAL 60 capsule 2   fluticasone (FLONASE) 50 MCG/ACT nasal spray Place 2 sprays into both  nostrils daily as needed for allergies or rhinitis.     ibuprofen (ADVIL) 200 MG tablet Take 800 mg by mouth every 6 (six) hours as needed for moderate pain.     levothyroxine (SYNTHROID) 25 MCG tablet Take 25 mcg by  mouth daily before breakfast.     loratadine (CLARITIN) 10 MG tablet Take 10 mg by mouth daily.     montelukast (SINGULAIR) 10 MG tablet Take 10 mg by mouth every morning.      rOPINIRole (REQUIP) 2 MG tablet Take 4 mg by mouth at bedtime.     traMADol (ULTRAM) 50 MG tablet Take 50-100 mg by mouth every 12 (twelve) hours as needed for severe pain.      valACYclovir (VALTREX) 1000 MG tablet Take 1,000 mg by mouth daily.     No current facility-administered medications for this visit.    Review of Systems: GENERAL: negative for malaise, night sweats HEENT: No changes in hearing or vision, no nose bleeds or other nasal problems. NECK: Negative for lumps, goiter, pain and significant neck swelling RESPIRATORY: Negative for cough, wheezing CARDIOVASCULAR: Negative for chest pain, leg swelling, palpitations, orthopnea GI: SEE HPI MUSCULOSKELETAL: Negative for joint pain or swelling, back pain, and muscle pain. SKIN: Negative for lesions, rash PSYCH: Negative for sleep disturbance, mood disorder and recent psychosocial stressors. HEMATOLOGY Negative for prolonged bleeding, bruising easily, and swollen nodes. ENDOCRINE: Negative for cold or heat intolerance, polyuria, polydipsia and goiter. NEURO: negative for tremor, gait imbalance, syncope and seizures. The remainder of the review of systems is noncontributory.   Physical Exam: BP 110/69 (BP Location: Left Arm, Patient Position: Sitting, Cuff Size: Large)   Pulse 85   Temp (!) 97.5 F (36.4 C) (Temporal)   Ht '5\' 7"'$  (1.702 m)   Wt 145 lb 11.2 oz (66.1 kg)   BMI 22.82 kg/m  GENERAL: The patient is AO x3, in no acute distress. HEENT: Head is normocephalic and atraumatic. EOMI are intact. Mouth is well hydrated and without  lesions. NECK: Supple. No masses LUNGS: Clear to auscultation. No presence of rhonchi/wheezing/rales. Adequate chest expansion HEART: RRR, normal s1 and s2. ABDOMEN: Soft, nontender, no guarding, no peritoneal signs, and nondistended. BS +. No masses. EXTREMITIES: Without any cyanosis, clubbing, rash, lesions or edema. NEUROLOGIC: AOx3, no focal motor deficit. SKIN: no jaundice, no rashes  Imaging/Labs: as above  I personally reviewed and interpreted the available labs, imaging and endoscopic files.  Impression and Plan: Kathryn [name] is a 66 y.o. female with PMH anxiety, asthma, GERD, Guillian Barre syndrome, IBS, who presents for follow up of nausea and IBS.  Patient has presented recurrent episodes of nausea for which she has had negative endoscopic investigations and negative brain imaging.  I discussed with her that we could explore this further with a CT of the abdomen and pelvis or a gastric emptying study the patient is not interested in proceeding with this for now.  Stated her nausea is manageable for now.  Still presenting some dysphagia episodes with negative workup, not interested in having a repeat esophageal manometry.  I explained that it is possible some of her dysphagia is related to presbyesophagus for which I gave her eating habit recommendations.  Regarding her constipation, this may be driving some of her abdominal complaints, for which I advised to start the intake of MiraLAX.  This may potentially indirectly improve her nausea by increasing her bowel emptying.  -If persistent nausea, consider proceeding with CT of the abdomen and pelvis and/or gastric emptying study - please notify us if willing to schedule this -Try to chew thoroughly and eat small pieces -Start taking Miralax 1 capful every day for one week. If bowel movements do not improve, increase to 2 capfuls every day. If after two  weeks there is no improvement, increase to 2 capfuls in AM and one at night. -  Will recheck CMP in next appointment  All questions were answered.      Kathryn Peppers, MD Gastroenterology and Hepatology [facility]

## 2022-08-27 DIAGNOSIS — U071 COVID-19: Secondary | ICD-10-CM | POA: Diagnosis not present

## 2022-09-03 DIAGNOSIS — J301 Allergic rhinitis due to pollen: Secondary | ICD-10-CM | POA: Diagnosis not present

## 2022-09-03 DIAGNOSIS — J3081 Allergic rhinitis due to animal (cat) (dog) hair and dander: Secondary | ICD-10-CM | POA: Diagnosis not present

## 2022-09-03 DIAGNOSIS — J3089 Other allergic rhinitis: Secondary | ICD-10-CM | POA: Diagnosis not present

## 2022-09-09 DIAGNOSIS — J301 Allergic rhinitis due to pollen: Secondary | ICD-10-CM | POA: Diagnosis not present

## 2022-09-09 DIAGNOSIS — J3089 Other allergic rhinitis: Secondary | ICD-10-CM | POA: Diagnosis not present

## 2022-09-09 DIAGNOSIS — J3081 Allergic rhinitis due to animal (cat) (dog) hair and dander: Secondary | ICD-10-CM | POA: Diagnosis not present

## 2022-09-17 DIAGNOSIS — J3089 Other allergic rhinitis: Secondary | ICD-10-CM | POA: Diagnosis not present

## 2022-09-17 DIAGNOSIS — J3081 Allergic rhinitis due to animal (cat) (dog) hair and dander: Secondary | ICD-10-CM | POA: Diagnosis not present

## 2022-09-17 DIAGNOSIS — J301 Allergic rhinitis due to pollen: Secondary | ICD-10-CM | POA: Diagnosis not present

## 2022-09-19 ENCOUNTER — Other Ambulatory Visit: Payer: Self-pay

## 2022-09-19 ENCOUNTER — Encounter (HOSPITAL_COMMUNITY): Payer: Self-pay | Admitting: *Deleted

## 2022-09-19 ENCOUNTER — Emergency Department (HOSPITAL_COMMUNITY): Payer: Medicare HMO

## 2022-09-19 ENCOUNTER — Emergency Department (HOSPITAL_COMMUNITY)
Admission: EM | Admit: 2022-09-19 | Discharge: 2022-09-19 | Disposition: A | Discharge status: Home / Self Care | Payer: Medicare HMO | Attending: Emergency Medicine | Admitting: Emergency Medicine

## 2022-09-19 DIAGNOSIS — R413 Other amnesia: Secondary | ICD-10-CM | POA: Diagnosis not present

## 2022-09-19 DIAGNOSIS — W19XXXA Unspecified fall, initial encounter: Secondary | ICD-10-CM

## 2022-09-19 DIAGNOSIS — S0993XA Unspecified injury of face, initial encounter: Secondary | ICD-10-CM | POA: Diagnosis present

## 2022-09-19 DIAGNOSIS — S0990XA Unspecified injury of head, initial encounter: Secondary | ICD-10-CM | POA: Diagnosis not present

## 2022-09-19 DIAGNOSIS — Z6822 Body mass index (BMI) 22.0-22.9, adult: Secondary | ICD-10-CM | POA: Diagnosis not present

## 2022-09-19 DIAGNOSIS — S8012XA Contusion of left lower leg, initial encounter: Secondary | ICD-10-CM | POA: Diagnosis not present

## 2022-09-19 DIAGNOSIS — R252 Cramp and spasm: Secondary | ICD-10-CM | POA: Diagnosis not present

## 2022-09-19 DIAGNOSIS — S0191XA Laceration without foreign body of unspecified part of head, initial encounter: Secondary | ICD-10-CM | POA: Diagnosis not present

## 2022-09-19 DIAGNOSIS — S0083XA Contusion of other part of head, initial encounter: Secondary | ICD-10-CM | POA: Diagnosis not present

## 2022-09-19 DIAGNOSIS — Y92002 Bathroom of unspecified non-institutional (private) residence single-family (private) house as the place of occurrence of the external cause: Secondary | ICD-10-CM | POA: Diagnosis not present

## 2022-09-19 DIAGNOSIS — S8011XA Contusion of right lower leg, initial encounter: Secondary | ICD-10-CM | POA: Diagnosis not present

## 2022-09-19 DIAGNOSIS — W01198A Fall on same level from slipping, tripping and stumbling with subsequent striking against other object, initial encounter: Secondary | ICD-10-CM | POA: Insufficient documentation

## 2022-09-19 LAB — CBC WITH DIFFERENTIAL/PLATELET
Abs Immature Granulocytes: 0.02 10*3/uL (ref 0.00–0.07)
Basophils Absolute: 0 10*3/uL (ref 0.0–0.1)
Basophils Relative: 1 %
Eosinophils Absolute: 0.2 10*3/uL (ref 0.0–0.5)
Eosinophils Relative: 3 %
HCT: 37.2 % (ref 36.0–46.0)
Hemoglobin: 12.7 g/dL (ref 12.0–15.0)
Immature Granulocytes: 0 %
Lymphocytes Relative: 22 %
Lymphs Abs: 1.4 10*3/uL (ref 0.7–4.0)
MCH: 33.2 pg (ref 26.0–34.0)
MCHC: 34.1 g/dL (ref 30.0–36.0)
MCV: 97.1 fL (ref 80.0–100.0)
Monocytes Absolute: 0.7 10*3/uL (ref 0.1–1.0)
Monocytes Relative: 10 %
Neutro Abs: 4.2 10*3/uL (ref 1.7–7.7)
Neutrophils Relative %: 64 %
Platelets: 254 10*3/uL (ref 150–400)
RBC: 3.83 MIL/uL — ABNORMAL LOW (ref 3.87–5.11)
RDW: 12.7 % (ref 11.5–15.5)
WBC: 6.5 10*3/uL (ref 4.0–10.5)
nRBC: 0 % (ref 0.0–0.2)

## 2022-09-19 LAB — BASIC METABOLIC PANEL
Anion gap: 9 (ref 5–15)
BUN: 17 mg/dL (ref 8–23)
CO2: 26 mmol/L (ref 22–32)
Calcium: 9.3 mg/dL (ref 8.9–10.3)
Chloride: 98 mmol/L (ref 98–111)
Creatinine, Ser: 0.96 mg/dL (ref 0.44–1.00)
GFR, Estimated: >60 mL/min (ref >60)
Glucose, Bld: 100 mg/dL — ABNORMAL HIGH (ref 70–99)
Potassium: 4.6 mmol/L (ref 3.5–5.1)
Sodium: 133 mmol/L — ABNORMAL LOW (ref 135–145)

## 2022-09-19 NOTE — ED Notes (Signed)
Fell at home went to urgent care they said to get a CT at hospital and it has been completed as has blood work. Patient and wife just want to go home.

## 2022-09-19 NOTE — ED Provider Triage Note (Signed)
Emergency Medicine Provider Triage Evaluation Note  Kathryn [name] , a 67 y.o. female  was evaluated in triage.  Pt complains of fall head injury.  Review of Systems  Positive: Head injury Negative: Loss of consciousness  Physical Exam  BP 134/76 (BP Location: Right Arm)   Pulse 65   Temp 98.3 F (36.8 C) (Oral)   Resp 14   Ht '5\' 7"'$  (1.702 m)   Wt 63.5 kg   SpO2 100%   BMI 21.93 kg/m  Gen:   Awake, no distress   Resp:  Normal effort  MSK:   Moves extremities without difficulty  Other:    Medical Decision Making  Medically screening exam initiated at 11:02 AM.  Appropriate orders placed.  Kathryn [name] was informed that the remainder of the evaluation will be completed by another provider, this initial triage assessment does not replace that evaluation, and the importance of remaining in the ED until their evaluation is complete.     Hayden Rasmussen, MD 09/19/22 1700

## 2022-09-19 NOTE — ED Provider Notes (Signed)
[facility] EMERGENCY DEPARTMENT Provider Note   CSN: [id number] Arrival date & time: 09/19/22  8502     History  Chief Complaint  Patient presents with   [name]    Kathryn [name] is a 67 y.o. female.  She is here for evaluation of injuries after a fall at home.  She said she fell after a leg spasm trying to get out of a small bathroom and fell hitting her forehead and nose.  No loss of consciousness.  History of leg spasms.  She went to urgent care where they repaired her wounds but recommended she come here to get a CAT scan.  She is complaining of head and face pain.  She is not on blood thinners.  The history is provided by the patient and the spouse.  Head Injury Location:  Frontal Mechanism of injury: fall   Fall:    Fall occurred:  Standing Pain details:    Quality:  Throbbing   Timing:  Constant   Progression:  Unchanged Chronicity:  New Relieved by:  None tried Worsened by:  Nothing Ineffective treatments:  None tried Associated symptoms: headache   Associated symptoms: no double vision, no focal weakness, no loss of consciousness and no vomiting        Home Medications Prior to Admission medications   Medication Sig Start Date End Date Taking? Authorizing Provider  acetaminophen (TYLENOL) 500 MG tablet Take 500-1,000 mg by mouth every 6 (six) hours as needed (For pain.).    [provider]  albuterol (PROVENTIL HFA;VENTOLIN HFA) 108 (90 Base) MCG/ACT inhaler Inhale 2 puffs into the lungs every 4 (four) hours as needed for wheezing or shortness of breath.    [provider]  ALPRAZolam Duanne Moron) 0.5 MG tablet Take 0.5 mg by mouth daily as needed for anxiety.    [provider]  Biotin 10000 MCG TABS Take 10,000 mcg by mouth daily.     [provider]  Calcium Carbonate-Vitamin D (CALTRATE 600+D PO) Take 1 tablet by mouth daily.    [provider]  celecoxib (CELEBREX) 200 MG capsule Take 200 mg by mouth 2 (two) times daily  as needed for moderate pain or mild pain (Back. neck and shoulder). 01/16/22   [provider]  Cholecalciferol (VITAMIN D-3) 125 MCG (5000 UT) TABS Take 5,000 Units by mouth daily.     [provider]  cyanocobalamin (VITAMIN B12) 1000 MCG tablet Take 1 tablet (1,000 mcg total) by mouth daily. 06/18/22   Barton Dubois, MD  EPINEPHrine 0.3 mg/0.3 mL IJ SOAJ injection Inject 0.3 mg into the skin as needed for anaphylaxis. 11/21/21   [provider]  escitalopram (LEXAPRO) 20 MG tablet Take 20 mg by mouth daily.    [provider]  esomeprazole (NEXIUM) 40 MG capsule TAKE 1 CAPSULE(40 MG) BY MOUTH TWICE DAILY BEFORE A MEAL 06/01/22   Carlan, Chelsea L, NP  fluticasone (FLONASE) 50 MCG/ACT nasal spray Place 2 sprays into both nostrils daily as needed for allergies or rhinitis.    [provider]  ibuprofen (ADVIL) 200 MG tablet Take 800 mg by mouth every 6 (six) hours as needed for moderate pain.    [provider]  levothyroxine (SYNTHROID) 25 MCG tablet Take 25 mcg by mouth daily before breakfast.    [provider]  loratadine (CLARITIN) 10 MG tablet Take 10 mg by mouth daily.    [provider]  montelukast (SINGULAIR) 10 MG tablet Take 10 mg by mouth  every morning.     [provider]  rOPINIRole (REQUIP) 2 MG tablet Take 4 mg by mouth at bedtime. 02/25/22   [provider]  traMADol (ULTRAM) 50 MG tablet Take 50-100 mg by mouth every 12 (twelve) hours as needed for severe pain.     [provider]  valACYclovir (VALTREX) 1000 MG tablet Take 1,000 mg by mouth daily. 10/29/16   [provider]      Allergies    Niacin and related, Codeine, and Zinc    Review of Systems   Review of Systems  Constitutional:  Negative for fever.  HENT:  Negative for sore throat.   Eyes:  Negative for double vision and visual disturbance.  Respiratory:  Negative for shortness of breath.   Cardiovascular:   Negative for chest pain.  Gastrointestinal:  Negative for abdominal pain and vomiting.  Genitourinary:  Negative for dysuria.  Skin:  Positive for wound. Negative for rash.  Neurological:  Positive for headaches. Negative for focal weakness and loss of consciousness.    Physical Exam Updated Vital Signs BP 134/76 (BP Location: Right Arm)   Pulse 65   Temp 98.3 F (36.8 C) (Oral)   Resp 14   Ht '5\' 7"'$  (1.702 m)   Wt 63.5 kg   SpO2 100%   BMI 21.93 kg/m  Physical Exam Vitals and nursing note reviewed.  Constitutional:      General: She is not in acute distress.    Appearance: Normal appearance. She is well-developed.  HENT:     Head: Normocephalic and atraumatic.     Comments: Patient has a laceration of her forehead and nose that have been dermabonded already. Eyes:     Conjunctiva/sclera: Conjunctivae normal.  Cardiovascular:     Rate and Rhythm: Normal rate and regular rhythm.     Heart sounds: No murmur heard. Pulmonary:     Effort: Pulmonary effort is normal. No respiratory distress.     Breath sounds: Normal breath sounds.  Abdominal:     Palpations: Abdomen is soft.     Tenderness: There is no abdominal tenderness. There is no guarding or rebound.  Musculoskeletal:        General: No deformity. Normal range of motion.     Cervical back: Neck supple.  Skin:    General: Skin is warm and dry.     Capillary Refill: Capillary refill takes less than 2 seconds.  Neurological:     General: No focal deficit present.     Mental Status: She is alert.     Sensory: No sensory deficit.     Motor: No weakness.     ED Results / Procedures / Treatments   Labs (all labs ordered are listed, but only abnormal results are displayed) Labs Reviewed  BASIC METABOLIC PANEL - Abnormal; Notable for the following components:      Result Value   Sodium 133 (*)    Glucose, Bld 100 (*)    All other components within normal limits  CBC WITH DIFFERENTIAL/PLATELET - Abnormal; Notable  for the following components:   RBC 3.83 (*)    All other components within normal limits    EKG None  Radiology CT Head Wo Contrast  Result Date: 09/19/2022 CLINICAL DATA:  Head trauma. EXAM: CT HEAD WITHOUT CONTRAST TECHNIQUE: Contiguous axial images were obtained from the base of the skull through the vertex without intravenous contrast. RADIATION DOSE REDUCTION: This exam was performed according to the departmental dose-optimization program which includes  automated exposure control, adjustment of the mA and/or kV according to patient size and/or use of iterative reconstruction technique. COMPARISON:  06/16/2022 FINDINGS: Brain: No evidence of acute infarction, hemorrhage, hydrocephalus, extra-axial collection or mass lesion/mass effect. Vascular: No hyperdense vessel or unexpected calcification. Skull: Normal. Negative for fracture or focal lesion. Sinuses/Orbits: Mucoperiosteal thickening consistent with chronic sinusitis right maxillary antrum. Imaged portion left maxillary antrum completely opacified, similar to the prior study. IMPRESSION: No acute intracranial process. Electronically Signed   By: Sammie Bench M.D.   On: 09/19/2022 11:49    Procedures Procedures    Medications Ordered in ED Medications - No data to display  ED Course/ Medical Decision Making/ A&P Clinical Course as of 09/19/22 1700  Sat Sep 19, 2022  1202 Patient states she does not want to stick around for the rest of her labs.  She would like to be discharged and I can call her if there is anything abnormal. [MB]    Clinical Course User Index [MB] Hayden Rasmussen, MD                             Medical Decision Making Amount and/or Complexity of Data Reviewed Labs: ordered. Radiology: ordered.   This patient complains of fall head injury dizziness muscle cramps; this involves an extensive number of treatment Options and is a complaint that carries with it a high risk of complications  and morbidity. The differential includes dehydration, metabolic derangement, renal failure, stroke, bleed, fracture  I ordered, reviewed and interpreted labs, which included CBC with normal white count normal hemoglobin, chemistries unremarkable I ordered imaging studies which included head CT and I independently    visualized and interpreted imaging which showed no acute findings Additional history obtained from patient's husband Previous records obtained and reviewed in epic no recent admissions Cardiac monitoring reviewed, normal sinus rhythm Social determinants considered, no significant barriers Critical Interventions: None  After the interventions stated above, I reevaluated the patient and found patient to be awake alert no distress Admission and further testing considered, no indications for admission at this time.  Patient recommended to follow-up with PCP for further evaluation.  Return instructions discussed         Final Clinical Impression(s) / ED Diagnoses Final diagnoses:  Fall, initial encounter  Contusion of face, initial encounter  Leg cramps    Rx / DC Orders ED Discharge Orders     None         Hayden Rasmussen, MD 09/19/22 1701

## 2022-09-19 NOTE — ED Triage Notes (Addendum)
Pt c/o fall this morning into the edge of her door. Pt reports she had cramps in her leg which caused her to fall. Denies use of blood thinners and LOC. Pt went to Urgent Care this morning and had the lacerations glued and steri strips placed, but was told to come to the ED to have a head CT "in presence of recent memory loss and hospitalization for possible stroke/seizure activity." Pt reports she was told she had a TIA 2-3 months ago. Pt also c/o neck pain.

## 2022-09-19 NOTE — Discharge Instructions (Signed)
You were seen in the emergency department for evaluation of injuries from a fall.  Urgent care had already repaired your lacerations.  You had a CAT scan that did not show any fracture or internal bleeding.  Lab work was also done and you can follow these results up in [place].  Please follow-up with your regular doctor.  Return to the emergency department if any worsening or concerning symptoms.

## 2022-09-23 DIAGNOSIS — I671 Cerebral aneurysm, nonruptured: Secondary | ICD-10-CM | POA: Diagnosis not present

## 2022-09-25 DIAGNOSIS — J3089 Other allergic rhinitis: Secondary | ICD-10-CM | POA: Diagnosis not present

## 2022-09-25 DIAGNOSIS — J301 Allergic rhinitis due to pollen: Secondary | ICD-10-CM | POA: Diagnosis not present

## 2022-09-25 DIAGNOSIS — J3081 Allergic rhinitis due to animal (cat) (dog) hair and dander: Secondary | ICD-10-CM | POA: Diagnosis not present

## 2022-09-28 ENCOUNTER — Other Ambulatory Visit: Payer: Self-pay | Admitting: Neurosurgery

## 2022-09-28 DIAGNOSIS — I671 Cerebral aneurysm, nonruptured: Secondary | ICD-10-CM

## 2022-09-30 DIAGNOSIS — J301 Allergic rhinitis due to pollen: Secondary | ICD-10-CM | POA: Diagnosis not present

## 2022-09-30 DIAGNOSIS — J3081 Allergic rhinitis due to animal (cat) (dog) hair and dander: Secondary | ICD-10-CM | POA: Diagnosis not present

## 2022-09-30 DIAGNOSIS — J3089 Other allergic rhinitis: Secondary | ICD-10-CM | POA: Diagnosis not present

## 2022-10-05 ENCOUNTER — Other Ambulatory Visit: Payer: Self-pay | Admitting: Neurosurgery

## 2022-10-08 DIAGNOSIS — J301 Allergic rhinitis due to pollen: Secondary | ICD-10-CM | POA: Diagnosis not present

## 2022-10-08 DIAGNOSIS — J3089 Other allergic rhinitis: Secondary | ICD-10-CM | POA: Diagnosis not present

## 2022-10-08 DIAGNOSIS — J3081 Allergic rhinitis due to animal (cat) (dog) hair and dander: Secondary | ICD-10-CM | POA: Diagnosis not present

## 2022-10-15 DIAGNOSIS — J3089 Other allergic rhinitis: Secondary | ICD-10-CM | POA: Diagnosis not present

## 2022-10-15 DIAGNOSIS — J301 Allergic rhinitis due to pollen: Secondary | ICD-10-CM | POA: Diagnosis not present

## 2022-10-21 DIAGNOSIS — J3081 Allergic rhinitis due to animal (cat) (dog) hair and dander: Secondary | ICD-10-CM | POA: Diagnosis not present

## 2022-10-21 DIAGNOSIS — J301 Allergic rhinitis due to pollen: Secondary | ICD-10-CM | POA: Diagnosis not present

## 2022-10-21 DIAGNOSIS — J3089 Other allergic rhinitis: Secondary | ICD-10-CM | POA: Diagnosis not present

## 2022-10-27 DIAGNOSIS — J3089 Other allergic rhinitis: Secondary | ICD-10-CM | POA: Diagnosis not present

## 2022-10-27 DIAGNOSIS — J301 Allergic rhinitis due to pollen: Secondary | ICD-10-CM | POA: Diagnosis not present

## 2022-10-27 DIAGNOSIS — J3081 Allergic rhinitis due to animal (cat) (dog) hair and dander: Secondary | ICD-10-CM | POA: Diagnosis not present

## 2022-11-03 ENCOUNTER — Other Ambulatory Visit: Payer: Self-pay | Admitting: Neurosurgery

## 2022-11-03 ENCOUNTER — Ambulatory Visit (HOSPITAL_COMMUNITY)
Admission: RE | Admit: 2022-11-03 | Discharge: 2022-11-03 | Disposition: A | Discharge status: Home / Self Care | Payer: Medicare HMO | Source: Ambulatory Visit | Attending: Neurosurgery | Admitting: Neurosurgery

## 2022-11-03 ENCOUNTER — Encounter (HOSPITAL_COMMUNITY): Payer: Self-pay

## 2022-11-03 DIAGNOSIS — I671 Cerebral aneurysm, nonruptured: Secondary | ICD-10-CM | POA: Insufficient documentation

## 2022-11-03 HISTORY — PX: IR ANGIO VERTEBRAL SEL VERTEBRAL UNI L MOD SED: IMG5367

## 2022-11-03 HISTORY — PX: IR ANGIO INTRA EXTRACRAN SEL INTERNAL CAROTID BILAT MOD SED: IMG5363

## 2022-11-03 LAB — CBC WITH DIFFERENTIAL/PLATELET
Abs Immature Granulocytes: 0.01 10*3/uL (ref 0.00–0.07)
Basophils Absolute: 0 10*3/uL (ref 0.0–0.1)
Basophils Relative: 1 %
Eosinophils Absolute: 0.2 10*3/uL (ref 0.0–0.5)
Eosinophils Relative: 4 %
HCT: 37.3 % (ref 36.0–46.0)
Hemoglobin: 13 g/dL (ref 12.0–15.0)
Immature Granulocytes: 0 %
Lymphocytes Relative: 30 %
Lymphs Abs: 1.3 10*3/uL (ref 0.7–4.0)
MCH: 33.4 pg (ref 26.0–34.0)
MCHC: 34.9 g/dL (ref 30.0–36.0)
MCV: 95.9 fL (ref 80.0–100.0)
Monocytes Absolute: 0.7 10*3/uL (ref 0.1–1.0)
Monocytes Relative: 17 %
Neutro Abs: 2.1 10*3/uL (ref 1.7–7.7)
Neutrophils Relative %: 48 %
Platelets: 249 10*3/uL (ref 150–400)
RBC: 3.89 MIL/uL (ref 3.87–5.11)
RDW: 13.2 % (ref 11.5–15.5)
WBC: 4.3 10*3/uL (ref 4.0–10.5)
nRBC: 0 % (ref 0.0–0.2)

## 2022-11-03 LAB — BASIC METABOLIC PANEL
Anion gap: 7 (ref 5–15)
BUN: 14 mg/dL (ref 8–23)
CO2: 26 mmol/L (ref 22–32)
Calcium: 9.2 mg/dL (ref 8.9–10.3)
Chloride: 99 mmol/L (ref 98–111)
Creatinine, Ser: 1.07 mg/dL — ABNORMAL HIGH (ref 0.44–1.00)
GFR, Estimated: 57 mL/min — ABNORMAL LOW (ref >60)
Glucose, Bld: 91 mg/dL (ref 70–99)
Potassium: 4.2 mmol/L (ref 3.5–5.1)
Sodium: 132 mmol/L — ABNORMAL LOW (ref 135–145)

## 2022-11-03 LAB — PROTIME-INR
INR: 1 (ref 0.8–1.2)
Prothrombin Time: 12.8 seconds (ref 11.4–15.2)

## 2022-11-03 MED ORDER — HEPARIN SODIUM (PORCINE) 1000 UNIT/ML IJ SOLN
INTRAMUSCULAR | Status: AC
  Filled 2022-11-03: qty 2

## 2022-11-03 MED ORDER — SODIUM CHLORIDE 0.9 % IV SOLN: INTRAVENOUS | Status: DC

## 2022-11-03 MED ORDER — IOHEXOL 300 MG/ML  SOLN
100.0000 mL | Freq: Once | INTRAMUSCULAR | Status: AC | PRN
  Administered 2022-11-03: 40 mL via INTRA_ARTERIAL

## 2022-11-03 MED ORDER — LIDOCAINE HCL 1 % IJ SOLN
INTRAMUSCULAR | Status: AC
  Administered 2022-11-03: 10 mL
  Filled 2022-11-03: qty 20

## 2022-11-03 MED ORDER — VERAPAMIL HCL 2.5 MG/ML IV SOLN
INTRAVENOUS | Status: AC
  Filled 2022-11-03: qty 2

## 2022-11-03 MED ORDER — MIDAZOLAM HCL 2 MG/2ML IJ SOLN
INTRAMUSCULAR | Status: AC | PRN
  Administered 2022-11-03: 1 mg via INTRAVENOUS

## 2022-11-03 MED ORDER — FENTANYL CITRATE (PF) 100 MCG/2ML IJ SOLN
INTRAMUSCULAR | Status: AC
  Filled 2022-11-03: qty 2

## 2022-11-03 MED ORDER — HEPARIN SODIUM (PORCINE) 1000 UNIT/ML IJ SOLN
INTRAMUSCULAR | Status: AC
  Filled 2022-11-03: qty 10

## 2022-11-03 MED ORDER — MIDAZOLAM HCL 2 MG/2ML IJ SOLN
INTRAMUSCULAR | Status: AC
  Filled 2022-11-03: qty 2

## 2022-11-03 MED ORDER — FENTANYL CITRATE (PF) 100 MCG/2ML IJ SOLN
INTRAMUSCULAR | Status: AC | PRN
  Administered 2022-11-03: 25 ug via INTRAVENOUS

## 2022-11-03 MED ORDER — HEPARIN SODIUM (PORCINE) 1000 UNIT/ML IJ SOLN
INTRAMUSCULAR | Status: AC | PRN
  Administered 2022-11-03: 2000 [IU] via INTRAVENOUS

## 2022-11-03 MED ORDER — HYDROCODONE-ACETAMINOPHEN 5-325 MG PO TABS: 1.0000 | ORAL_TABLET | ORAL | Status: DC | PRN

## 2022-11-03 NOTE — Brief Op Note (Signed)
  NEUROSURGERY BRIEF OPERATIVE  NOTE   PREOP DX: Aneurysm  POSTOP DX: Same  PROCEDURE: Diagnostic cerebral angiogram  SURGEON: Dr. Consuella Lose, MD  ANESTHESIA: IV Sedation with Local  APPROACH: Right trans-femoral  EBL: Minimal  SPECIMENS: None  COMPLICATIONS: None  CONDITION: Stable to recovery  FINDINGS (Full report in [facility]): 1. Normal cerebral angiogram without intracranial aneurysm, fistula, or AVM. There is a patulous basilar apex but no saccular aneurysm.   Consuella Lose, MD [facility]

## 2022-11-03 NOTE — H&P (Signed)
Chief Complaint   Aneurys,m  History of Present Illness  Kathryn [name] is a 67 y.o. 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 She therefore presents today for diagnostic cerebral angiogram.  Past Medical History   Past Medical History:  Diagnosis Date   Adrenal insufficiency ([place])    occured following steroid shoulder injections ~ spring 2017   Anxiety    pt. reports that she has RLS- takes Xanax for calming her self so she can settle & sleep    Arthritis    shoulder & back, knees & leg     Asthma    aleery related   Atypical mole 11/12/2020   mod-severe- mid back   Chest pain, atypical    had cardiac workup - deemed related to anxiety   Chronic bronchitis ([place])    "less since I quit smoking" (02/21/2016)   Chronic lower back pain    Dyspnea    related to spray paint that is put in  beer cans at her place of employment.  Pt. also reports that she has anxiety also that leads to SOB., Pt. reports her Mother just died a week ago     GERD (gastroesophageal reflux disease)    Guillain-Barre syndrome following vaccination ([place]) 1970's   post swine flu shot, hosp. for treatment for 1 month    H/O dizziness    H/O hiatal hernia    Hepatitis    hepatitis as a child; "got it from a little girl at school" make sick and vomiting   Herpes simplex    History of Guillain-Barre syndrome ~ 1978   "from the swine flu shot"   IBS (irritable bowel syndrome)    Pneumonia    PONV (postoperative nausea and vomiting)    Restless leg syndrome    Walking pneumonia ~ 2011    Past Surgical History   Past Surgical History:  Procedure Laterality Date   49 HOUR [place] STUDY N/A 09/23/2016   Procedure: 24 HOUR [place] STUDY;  Surgeon: Mauri Pole, MD;  Location: [facility];  Service: Endoscopy;  Laterality: N/A;   ANTERIOR CERVICAL DECOMP/DISCECTOMY FUSION  2000 & 2015   two times ago- C5 and C6, Dr. Otilio Connors, MD 2015   APPENDECTOMY   1980s   BACK SURGERY     BIOPSY  03/24/2022   Procedure: BIOPSY;  Surgeon: Harvel Quale, MD;  [facility];  Service: Gastroenterology;;   CHOLECYSTECTOMY OPEN  1980s   COLONOSCOPY N/A 09/20/2014   [place]hman: Prep was excellent except she had thick layer of stool coating cecal and ascending colon mucosa. Cecal landmarks were well identified after vigorous washing. Small lymphoid polyp ablated via cold biopsy from appendiceal stump.Mucosa of rest of the colon was normal. Normal mucosa of rectum and anorectal junction.   ESOPHAGEAL DILATION N/A 05/29/2019   Procedure: ESOPHAGEAL DILATION;  Surgeon: Rogene Houston, MD;  [facility];  Service: Endoscopy;  Laterality: N/A;   ESOPHAGEAL MANOMETRY N/A 09/23/2016   Procedure: ESOPHAGEAL MANOMETRY (EM);  Surgeon: Mauri Pole, MD;  [facility];  Service: Endoscopy;  Laterality: N/A;   ESOPHAGOGASTRODUODENOSCOPY N/A 09/20/2014   Procedure: ESOPHAGOGASTRODUODENOSCOPY (EGD);  Surgeon: Rogene Houston, MD;  [facility];  Service: Endoscopy;  Laterality: N/A;   ESOPHAGOGASTRODUODENOSCOPY (EGD) WITH PROPOFOL N/A 05/29/2019   - Z-line, 38 cm from the incisors.   ESOPHAGOGASTRODUODENOSCOPY (EGD) WITH PROPOFOL N/A 03/24/2022   Procedure: ESOPHAGOGASTRODUODENOSCOPY (EGD)  WITH PROPOFOL;  Surgeon: Montez Morita, Quillian Quince, MD;  [facility];  Service: Gastroenterology;  Laterality: N/A;  730 ASA 1   HIATAL HERNIA REPAIR N/A 11/27/2016   Procedure: LAPAROSCOPIC REPAIR OF HIATAL HERNIA WITH NISSEN FUNDOPLICATION;  Surgeon: Jackolyn Confer, MD;  [facility]: [facility];  Service: General;  Laterality: N/A;   INCISION AND DRAINAGE OF WOUND Left 02/21/2016   forearm, minor   LUMBAR LAMINECTOMY/DECOMPRESSION MICRODISCECTOMY Right 06/22/2013   Procedure: RIGHT LUMBAR TWO-THREE DISCECTOMY;  Surgeon: Otilio Connors, MD;  Location: [place] [facility];  Service: Neurosurgery;  Laterality: Right;  Right    MALONEY  DILATION N/A 09/20/2014   Procedure: Venia Minks DILATION;  Surgeon: Rogene Houston, MD;  [facility];  Service: Endoscopy;  Laterality: N/A;   MINOR IRRIGATION AND DEBRIDEMENT OF WOUND Left 02/21/2016   Procedure: MINOR IRRIGATION AND DEBRIDEMENT/REPAIR OF LEFT ARM WOUND;  Surgeon: Netta Cedars, MD;  Location: [place];  Service: Orthopedics;  Laterality: Left;   REVERSE SHOULDER ARTHROPLASTY Right 02/21/2016   Procedure: RIGHT REVERSE TOTAL SHOULDER ARTHROPLASTY;  Surgeon: Netta Cedars, MD;  Location: [place];  Service: Orthopedics;  Laterality: Right;   REVERSE TOTAL SHOULDER ARTHROPLASTY Right 02/21/2016   SAVORY DILATION  03/24/2022   Procedure: SAVORY DILATION;  Surgeon: Montez Morita, Quillian Quince, MD;  [facility];  Service: Gastroenterology;;   SHOULDER ARTHROSCOPY W/ ROTATOR CUFF REPAIR Right 1990s   TOTAL SHOULDER REPLACEMENT     Right   VAGINAL HYSTERECTOMY      Social History   Social History   Tobacco Use   Smoking status: Former    Packs/day: 3.00    Years: 10.00    Total pack years: 30.00    Types: Cigarettes    Quit date: 05/09/1999    Years since quitting: 23.5   Smokeless tobacco: Never   Tobacco comments:    "quit smoking in ~ 2003"  Vaping Use   Vaping Use: Never used  Substance Use Topics   Alcohol use: No    Comment: 02/21/2016 "I used to drink a little bit; probably quit in my 20s"   Drug use: No    Medications   Prior to Admission medications   Medication Sig Start Date End Date Taking? Authorizing Provider  acetaminophen (TYLENOL) 500 MG tablet Take 500-1,000 mg by mouth every 6 (six) hours as needed (For pain.).   Yes [provider]  ALPRAZolam Duanne Moron) 0.5 MG tablet Take 0.5 mg by mouth daily as needed for anxiety.   Yes [provider]  Biotin 10000 MCG TABS Take 10,000 mcg by mouth daily.    Yes [provider]  celecoxib (CELEBREX) 200 MG capsule Take 200 mg by mouth 2 (two) times daily as needed for  moderate pain or mild pain (Back. neck and shoulder). 01/16/22  Yes [provider]  Cholecalciferol (VITAMIN D-3) 125 MCG (5000 UT) TABS Take 5,000 Units by mouth daily.    Yes [provider]  cyanocobalamin (VITAMIN B12) 1000 MCG tablet Take 1 tablet (1,000 mcg total) by mouth daily. 06/18/22  Yes Barton Dubois, MD  escitalopram (LEXAPRO) 20 MG tablet Take 20 mg by mouth daily.   Yes [provider]  esomeprazole (NEXIUM) 40 MG capsule TAKE 1 CAPSULE(40 MG) BY MOUTH TWICE DAILY BEFORE A MEAL 06/01/22  Yes Carlan, Chelsea L, NP  fluticasone (FLONASE) 50 MCG/ACT nasal spray Place 2 sprays into both nostrils daily as needed for allergies or rhinitis.   Yes [provider]  ibuprofen (ADVIL) 200 MG tablet Take 800 mg by mouth every 6 (six) hours as needed for moderate pain.   Yes [provider]  levothyroxine (SYNTHROID) 25 MCG tablet Take 25 mcg by mouth daily before breakfast.   Yes [provider]  loratadine (CLARITIN) 10 MG tablet Take 10 mg by mouth daily.   Yes [provider]  montelukast (SINGULAIR) 10 MG tablet Take 10 mg by mouth every morning.    Yes [provider]  rOPINIRole (REQUIP) 2 MG tablet Take 4 mg by mouth at bedtime. 02/25/22  Yes [provider]  valACYclovir (VALTREX) 1000 MG tablet Take 1,000 mg by mouth daily. 10/29/16  Yes [provider]  albuterol (PROVENTIL HFA;VENTOLIN HFA) 108 (90 Base) MCG/ACT inhaler Inhale 2 puffs into the lungs every 4 (four) hours as needed for wheezing or shortness of breath.    [provider]  Calcium Carbonate-Vitamin D (CALTRATE 600+D PO) Take 1 tablet by mouth daily.    [provider]  EPINEPHrine 0.3 mg/0.3 mL IJ SOAJ injection Inject 0.3 mg into the skin as needed for anaphylaxis. 11/21/21   [provider]  traMADol (ULTRAM) 50 MG tablet Take 50-100 mg by mouth every 12 (twelve) hours as needed for severe pain.     [provider]    Allergies   Allergies  Allergen Reactions   Niacin And Related Shortness Of Breath, Swelling and Other (See Comments)    Neck; skin hot   Codeine Swelling and Other (See Comments)    tongue   Zinc Swelling    Review of Systems  ROS  Neurologic Exam  Awake, alert, oriented Memory and concentration grossly intact Speech fluent, appropriate CN grossly intact Motor exam: Upper Extremities Deltoid Bicep Tricep Grip  Right 5/5 5/5 5/5 5/5  Left 5/5 5/5 5/5 5/5   Lower Extremities IP Quad PF DF EHL  Right 5/5 5/5 5/5 5/5 5/5  Left 5/5 5/5 5/5 5/5 5/5   Sensation grossly intact to LT  Imaging  CTA demonstrates 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 We have also reviewed in detail the risks, benefits, and alternatives to the procedure. All questions were answered and Kathryn [name] provided informed consent to proceed.  Consuella Lose, MD [facility]

## 2022-11-03 NOTE — Progress Notes (Signed)
Patient ambulated to BR, right groin is CDI.

## 2022-11-05 DIAGNOSIS — M5136 Other intervertebral disc degeneration, lumbar region: Secondary | ICD-10-CM | POA: Diagnosis not present

## 2022-11-05 DIAGNOSIS — E559 Vitamin D deficiency, unspecified: Secondary | ICD-10-CM | POA: Diagnosis not present

## 2022-11-05 DIAGNOSIS — Z1331 Encounter for screening for depression: Secondary | ICD-10-CM | POA: Diagnosis not present

## 2022-11-05 DIAGNOSIS — Z6822 Body mass index (BMI) 22.0-22.9, adult: Secondary | ICD-10-CM | POA: Diagnosis not present

## 2022-11-05 DIAGNOSIS — E782 Mixed hyperlipidemia: Secondary | ICD-10-CM | POA: Diagnosis not present

## 2022-11-05 DIAGNOSIS — E039 Hypothyroidism, unspecified: Secondary | ICD-10-CM | POA: Diagnosis not present

## 2022-11-05 DIAGNOSIS — G459 Transient cerebral ischemic attack, unspecified: Secondary | ICD-10-CM | POA: Diagnosis not present

## 2022-11-05 DIAGNOSIS — Z0001 Encounter for general adult medical examination with abnormal findings: Secondary | ICD-10-CM | POA: Diagnosis not present

## 2022-11-06 DIAGNOSIS — J301 Allergic rhinitis due to pollen: Secondary | ICD-10-CM | POA: Diagnosis not present

## 2022-11-06 DIAGNOSIS — J3089 Other allergic rhinitis: Secondary | ICD-10-CM | POA: Diagnosis not present

## 2022-11-12 DIAGNOSIS — J3081 Allergic rhinitis due to animal (cat) (dog) hair and dander: Secondary | ICD-10-CM | POA: Diagnosis not present

## 2022-11-12 DIAGNOSIS — J3089 Other allergic rhinitis: Secondary | ICD-10-CM | POA: Diagnosis not present

## 2022-11-12 DIAGNOSIS — J301 Allergic rhinitis due to pollen: Secondary | ICD-10-CM | POA: Diagnosis not present

## 2022-11-17 DIAGNOSIS — J029 Acute pharyngitis, unspecified: Secondary | ICD-10-CM | POA: Diagnosis not present

## 2022-11-17 DIAGNOSIS — Z20818 Contact with and (suspected) exposure to other bacterial communicable diseases: Secondary | ICD-10-CM | POA: Diagnosis not present

## 2022-11-20 DIAGNOSIS — J3081 Allergic rhinitis due to animal (cat) (dog) hair and dander: Secondary | ICD-10-CM | POA: Diagnosis not present

## 2022-11-20 DIAGNOSIS — J301 Allergic rhinitis due to pollen: Secondary | ICD-10-CM | POA: Diagnosis not present

## 2022-11-20 DIAGNOSIS — J3089 Other allergic rhinitis: Secondary | ICD-10-CM | POA: Diagnosis not present

## 2022-11-24 DIAGNOSIS — I671 Cerebral aneurysm, nonruptured: Secondary | ICD-10-CM | POA: Diagnosis not present

## 2022-11-27 DIAGNOSIS — J301 Allergic rhinitis due to pollen: Secondary | ICD-10-CM | POA: Diagnosis not present

## 2022-11-27 DIAGNOSIS — J3081 Allergic rhinitis due to animal (cat) (dog) hair and dander: Secondary | ICD-10-CM | POA: Diagnosis not present

## 2022-11-27 DIAGNOSIS — J3089 Other allergic rhinitis: Secondary | ICD-10-CM | POA: Diagnosis not present

## 2022-12-02 DIAGNOSIS — J301 Allergic rhinitis due to pollen: Secondary | ICD-10-CM | POA: Diagnosis not present

## 2022-12-02 DIAGNOSIS — J3089 Other allergic rhinitis: Secondary | ICD-10-CM | POA: Diagnosis not present

## 2022-12-06 DIAGNOSIS — J302 Other seasonal allergic rhinitis: Secondary | ICD-10-CM | POA: Diagnosis not present

## 2022-12-06 DIAGNOSIS — E782 Mixed hyperlipidemia: Secondary | ICD-10-CM | POA: Diagnosis not present

## 2022-12-06 DIAGNOSIS — K219 Gastro-esophageal reflux disease without esophagitis: Secondary | ICD-10-CM | POA: Diagnosis not present

## 2022-12-06 DIAGNOSIS — E039 Hypothyroidism, unspecified: Secondary | ICD-10-CM | POA: Diagnosis not present

## 2022-12-09 ENCOUNTER — Institutional Professional Consult (permissible substitution): Payer: PPO | Admitting: Neurology

## 2022-12-09 DIAGNOSIS — J301 Allergic rhinitis due to pollen: Secondary | ICD-10-CM | POA: Diagnosis not present

## 2022-12-09 DIAGNOSIS — J3089 Other allergic rhinitis: Secondary | ICD-10-CM | POA: Diagnosis not present

## 2022-12-10 DIAGNOSIS — J3089 Other allergic rhinitis: Secondary | ICD-10-CM | POA: Diagnosis not present

## 2022-12-10 DIAGNOSIS — J3081 Allergic rhinitis due to animal (cat) (dog) hair and dander: Secondary | ICD-10-CM | POA: Diagnosis not present

## 2022-12-18 DIAGNOSIS — J301 Allergic rhinitis due to pollen: Secondary | ICD-10-CM | POA: Diagnosis not present

## 2022-12-18 DIAGNOSIS — J3081 Allergic rhinitis due to animal (cat) (dog) hair and dander: Secondary | ICD-10-CM | POA: Diagnosis not present

## 2022-12-18 DIAGNOSIS — J3089 Other allergic rhinitis: Secondary | ICD-10-CM | POA: Diagnosis not present

## 2022-12-25 DIAGNOSIS — J301 Allergic rhinitis due to pollen: Secondary | ICD-10-CM | POA: Diagnosis not present

## 2022-12-25 DIAGNOSIS — J3089 Other allergic rhinitis: Secondary | ICD-10-CM | POA: Diagnosis not present

## 2022-12-29 ENCOUNTER — Ambulatory Visit (INDEPENDENT_AMBULATORY_CARE_PROVIDER_SITE_OTHER): Payer: Medicare HMO | Admitting: Gastroenterology

## 2022-12-29 ENCOUNTER — Encounter (INDEPENDENT_AMBULATORY_CARE_PROVIDER_SITE_OTHER): Payer: Self-pay | Admitting: Gastroenterology

## 2022-12-29 ENCOUNTER — Other Ambulatory Visit (INDEPENDENT_AMBULATORY_CARE_PROVIDER_SITE_OTHER): Payer: Self-pay | Admitting: *Deleted

## 2022-12-29 ENCOUNTER — Telehealth (INDEPENDENT_AMBULATORY_CARE_PROVIDER_SITE_OTHER): Payer: Self-pay | Admitting: *Deleted

## 2022-12-29 VITALS — BP 118/76 | HR 71 | Temp 99.2°F | Ht 67.0 in | Wt 148.0 lb

## 2022-12-29 DIAGNOSIS — R1319 Other dysphagia: Secondary | ICD-10-CM | POA: Diagnosis not present

## 2022-12-29 DIAGNOSIS — K581 Irritable bowel syndrome with constipation: Secondary | ICD-10-CM

## 2022-12-29 DIAGNOSIS — K219 Gastro-esophageal reflux disease without esophagitis: Secondary | ICD-10-CM | POA: Diagnosis not present

## 2022-12-29 DIAGNOSIS — R110 Nausea: Secondary | ICD-10-CM | POA: Diagnosis not present

## 2022-12-29 MED ORDER — ESOMEPRAZOLE MAGNESIUM 40 MG PO CPDR: DELAYED_RELEASE_CAPSULE | ORAL | 0 refills | Status: DC

## 2022-12-29 MED ORDER — OMEPRAZOLE 40 MG PO CPDR: 40.0000 mg | DELAYED_RELEASE_CAPSULE | Freq: Every day | ORAL | 3 refills | Status: DC

## 2022-12-29 NOTE — Patient Instructions (Signed)
You can stop nexium, I have sent omeprazole  to take once daily, this should be more affordable on your insurance plan, if this does not keep your acid reflux controlled, please let me know Can continue with the kefer with good results. Increase water intake, aim for atleast 64 oz per day Increase fruits, veggies and whole grains, kiwi and prunes are especially good for constipation Consider the gastric emptying study as we discussed for nausea, can let me know if you wish to do this Continue to take small bites, chew thoroughly, avoid thicker, drier foods, and take sips of liquids between bites  Follow up 3 months   It was a pleasure to see you today. I want to create trusting relationships with patients and provide genuine, compassionate, and quality care. I truly value your feedback! please be on the lookout for a survey regarding your visit with me today. I appreciate your input about our visit and your time in completing this!    Oluwadara Gorman L. Jeanmarie Hubert, MSN, APRN, AGNP-C Adult-Gerontology Nurse Practitioner [facility]

## 2022-12-29 NOTE — Telephone Encounter (Signed)
Left message to return call 

## 2022-12-29 NOTE — Telephone Encounter (Signed)
Patient seen today and was changed from pantoprazole to omeprazole due to cost. She has tried omeprazole in the past and it did not provide much relief. Would like to stay on pantoprazole and do a 90 day supply to see if that brings down cost and cancel omeprazole.   [facility] [address].

## 2022-12-29 NOTE — Progress Notes (Addendum)
Referring Provider: Assunta Found, MD Primary Care Physician:  Assunta Found, MD Primary GI Physician: Levon Hedger   Chief Complaint  Patient presents with   Constipation    Follow up on constipation. Started drinking kefer and reports BM are regular now.    Nausea    Follow up on nausea. Has nausea 2 -3 times per week.  States insurance would not pay for nausea med prescribed at last visit.    Dysphagia    Reports dysphagia is about the same. Feel like food gets hung and hurts in chest when trying to go down. Potatoes and bread are worse than other foods but sometimes it does not matter what food it is.    HPI:   Kathryn [name] is a 67 y.o. female with past medical history of anxiety, asthma, GERD, Guillian Barre syndrome, IBS,   Patient presenting today for follow up of nausea, dysphagia and constipation.  Last seen December 2023, at that time still presenting nighttime recurrent nausea. She reports having some nausea during the day but it is worse at night. cannot vomit as she had a Nissen fundoplication in the past. She reports having these symptoms 3 years.  Insurance did not cover the Zofran and she has not tried any medication for nausea. She reports that she has had dysphagia for multiple years, even before having her Nissen. She reports that she had a manometry in the past prior to undergoing her surgery. Had esophagram in 2020 showing possible related esophageal dysmotility.  can have some cramping in her mid abdomen. She takes dicyclomine as needed for abdominal pain but this makes more constipated. having Bristol 1 stools, has 1 - 2 small bowel movements per day. She is taking tramadol 50 mg for back pain as needed, but sometimes takes ibuprofen. Does not take any laxatives.   Last TSH 0.68 in 06/2022. Did not notice any difference between Nexium once a day vs twice a day. She has presented heartburn possibly once a week.   Recommended consider CT and/or GES for nausea (pt  declined), start miralax, CMP at next visit.  Present:  She states she started doing kefir and is having a BM most everyday, still with some harder stool balls, she notes she feels like she needs to drink more water but symptoms have improved quite a bit. Denies rectal bleeding or melena.   She notes she continues to have nausea at night, this is intermittent. She can sometimes go a week without nausea. Cannot vomit. We sent zofran in the past but her insurance would not cover it so she is not currently taking anything. Her appetite is good. She has actually gained some weight. She notes having nausea prior to starting SSRI.   She notes that her insurance will not pay well for her nexium, she states that this seems to be working relatively well for her, unless she eats something spicy or tomato based.  She does have some acid regurgitation if she belches.   Dysphagia is about the same, usually more issues with foods than liquids, she notes that sometimes she does fine and other times foods feel like they are going to get stuck.   Last EGD: 03/24/2022 which showed a 1 cm hiatal hernia and presence of a prior Nissen fundoplication with a loose wrap, no other abnormalities were found in the esophageal lumen.  The esophagus was empirically dilated with an 18 mm Savary dilator.  Biopsies mid and distal esophagus were unremarkable  Last Colonoscopy:  2016 Prep was excellent except she had thick layer of stool coating cecal and ascending colon mucosa. Cecal landmarks were well identified after vigorous washing. Small polyp ablated via cold biopsy from appendiceal stump. Mucosa of rest of the colon was normal. Normal mucosa of rectum and anorectal junction   Past Medical History:  Diagnosis Date   Adrenal insufficiency    occured following steroid shoulder injections ~ spring 2017   Anxiety    pt. reports that she has RLS- takes Xanax for calming her self so she can settle & sleep    Arthritis     shoulder & back, knees & leg     Asthma    aleery related   Atypical mole 11/12/2020   mod-severe- mid back   Chest pain, atypical    had cardiac workup - deemed related to anxiety   Chronic bronchitis    "less since I quit smoking" (02/21/2016)   Chronic lower back pain    Dyspnea    related to spray paint that is put in  beer cans at her place of employment.  Pt. also reports that she has anxiety also that leads to SOB., Pt. reports her Mother just died a week ago     GERD (gastroesophageal reflux disease)    Guillain-Barre syndrome following vaccination 1970's   post swine flu shot, hosp. for treatment for 1 month    H/O dizziness    H/O hiatal hernia    Hepatitis    hepatitis as a child; "got it from a little girl at school" make sick and vomiting   Herpes simplex    History of Guillain-Barre syndrome ~ 1978   "from the swine flu shot"   IBS (irritable bowel syndrome)    Pneumonia    PONV (postoperative nausea and vomiting)    Restless leg syndrome    Walking pneumonia ~ 2011    Past Surgical History:  Procedure Laterality Date   63 HOUR PH STUDY N/A 09/23/2016   Procedure: 24 HOUR PH STUDY;  Surgeon: Napoleon Form, MD;  Location: [facility];  Service: Endoscopy;  Laterality: N/A;   ANTERIOR CERVICAL DECOMP/DISCECTOMY FUSION  2000 & 2015   two times ago- C5 and C6, Dr. Clydene Fake, MD 2015   APPENDECTOMY  1980s   BACK SURGERY     BIOPSY  03/24/2022   Procedure: BIOPSY;  Surgeon: Dolores Frame, MD;  Location: [facility];  Service: Gastroenterology;;   CHOLECYSTECTOMY OPEN  1980s   COLONOSCOPY N/A 09/20/2014   rehman: Prep was excellent except she had thick layer of stool coating cecal and ascending colon mucosa. Cecal landmarks were well identified after vigorous washing. Small lymphoid polyp ablated via cold biopsy from appendiceal stump.Mucosa of rest of the colon was normal. Normal mucosa of rectum and anorectal junction.   ESOPHAGEAL DILATION  N/A 05/29/2019   Procedure: ESOPHAGEAL DILATION;  Surgeon: Malissa Hippo, MD;  [facility]: [facility];  Service: Endoscopy;  Laterality: N/A;   ESOPHAGEAL MANOMETRY N/A 09/23/2016   Procedure: ESOPHAGEAL MANOMETRY (EM);  Surgeon: Napoleon Form, MD;  Location: [facility];  Service: Endoscopy;  Laterality: N/A;   ESOPHAGOGASTRODUODENOSCOPY N/A 09/20/2014   Procedure: ESOPHAGOGASTRODUODENOSCOPY (EGD);  Surgeon: Malissa Hippo, MD;  [facility]: [facility];  Service: Endoscopy;  Laterality: N/A;   ESOPHAGOGASTRODUODENOSCOPY (EGD) WITH PROPOFOL N/A 05/29/2019   - Z-line, 38 cm from the incisors.   ESOPHAGOGASTRODUODENOSCOPY (EGD) WITH PROPOFOL N/A 03/24/2022   Procedure: ESOPHAGOGASTRODUODENOSCOPY (EGD) WITH PROPOFOL;  Surgeon: Marguerita Merles, Reuel Boom, MD;  [facility];  Service: Gastroenterology;  Laterality: N/A;  730 ASA 1   HIATAL HERNIA REPAIR N/A 11/27/2016   Procedure: LAPAROSCOPIC REPAIR OF HIATAL HERNIA WITH NISSEN FUNDOPLICATION;  Surgeon: Avel Peace, MD;  Location: [facility];  Service: General;  Laterality: N/A;   INCISION AND DRAINAGE OF WOUND Left 02/21/2016   forearm, minor   IR ANGIO INTRA EXTRACRAN SEL INTERNAL CAROTID BILAT MOD SED  11/03/2022   IR ANGIO VERTEBRAL SEL VERTEBRAL UNI L MOD SED  11/03/2022   LUMBAR LAMINECTOMY/DECOMPRESSION MICRODISCECTOMY Right 06/22/2013   Procedure: RIGHT LUMBAR TWO-THREE DISCECTOMY;  Surgeon: Clydene Fake, MD;  Location: [facility];  Service: Neurosurgery;  Laterality: Right;  Right    MALONEY DILATION N/A 09/20/2014   Procedure: Elease Hashimoto DILATION;  Surgeon: Malissa Hippo, MD;  [facility];  Service: Endoscopy;  Laterality: N/A;   MINOR IRRIGATION AND DEBRIDEMENT OF WOUND Left 02/21/2016   Procedure: MINOR IRRIGATION AND DEBRIDEMENT/REPAIR OF LEFT ARM WOUND;  Surgeon: Beverely Low, MD;  Location: [facility];  Service: Orthopedics;  Laterality: Left;   REVERSE SHOULDER ARTHROPLASTY Right 02/21/2016   Procedure:  RIGHT REVERSE TOTAL SHOULDER ARTHROPLASTY;  Surgeon: Beverely Low, MD;  Location: [facility];  Service: Orthopedics;  Laterality: Right;   REVERSE TOTAL SHOULDER ARTHROPLASTY Right 02/21/2016   SAVORY DILATION  03/24/2022   Procedure: SAVORY DILATION;  Surgeon: Marguerita Merles, Reuel Boom, MD;  [facility];  Service: Gastroenterology;;   SHOULDER ARTHROSCOPY W/ ROTATOR CUFF REPAIR Right 1990s   TOTAL SHOULDER REPLACEMENT     Right   VAGINAL HYSTERECTOMY      Current Outpatient Medications  Medication Sig Dispense Refill   acetaminophen (TYLENOL) 500 MG tablet Take 500-1,000 mg by mouth every 6 (six) hours as needed (For pain.).     ALPRAZolam (XANAX) 0.5 MG tablet Take 0.5 mg by mouth daily as needed for anxiety.     Biotin 16109 MCG TABS Take 10,000 mcg by mouth daily.      Cholecalciferol (VITAMIN D-3) 125 MCG (5000 UT) TABS Take 5,000 Units by mouth daily.      cyanocobalamin (VITAMIN B12) 1000 MCG tablet Take 1 tablet (1,000 mcg total) by mouth daily. 30 tablet 2   EPINEPHrine 0.3 mg/0.3 mL IJ SOAJ injection Inject 0.3 mg into the skin as needed for anaphylaxis.     escitalopram (LEXAPRO) 20 MG tablet Take 20 mg by mouth daily.     esomeprazole (NEXIUM) 40 MG capsule TAKE 1 CAPSULE(40 MG) BY MOUTH TWICE DAILY BEFORE A MEAL 60 capsule 2   fluticasone (FLONASE) 50 MCG/ACT nasal spray Place 2 sprays into both nostrils daily as needed for allergies or rhinitis.     GABAPENTIN PO Take by mouth. Patient unsure of dose but takes up to three times per day for restless legs     ibuprofen (ADVIL) 200 MG tablet Take 800 mg by mouth every 6 (six) hours as needed for moderate pain.     levothyroxine (SYNTHROID) 25 MCG tablet Take 25 mcg by mouth daily before breakfast.     loratadine (CLARITIN) 10 MG tablet Take 10 mg by mouth daily.     montelukast (SINGULAIR) 10 MG tablet Take 10 mg by mouth every morning.      rOPINIRole (REQUIP) 4 MG tablet Take 4 mg by mouth daily. 4 - 6 mg as needed      traMADol (ULTRAM) 50 MG tablet Take 50-100 mg by mouth every 12 (twelve)  hours as needed for severe pain.      valACYclovir (VALTREX) 1000 MG tablet Take 1,000 mg by mouth daily.     albuterol (PROVENTIL HFA;VENTOLIN HFA) 108 (90 Base) MCG/ACT inhaler Inhale 2 puffs into the lungs every 4 (four) hours as needed for wheezing or shortness of breath. (Patient not taking: Reported on 12/29/2022)     celecoxib (CELEBREX) 200 MG capsule Take 200 mg by mouth 2 (two) times daily as needed for moderate pain or mild pain (Back. neck and shoulder). (Patient not taking: Reported on 12/29/2022)     No current facility-administered medications for this visit.    Allergies as of 12/29/2022 - Review Complete 12/29/2022  Allergen Reaction Noted   Niacin and related Shortness Of Breath, Swelling, and Other (See Comments) 06/14/2013   Codeine Swelling and Other (See Comments) 06/14/2013   Zinc Swelling 02/07/2016    Family History  Problem Relation Age of Onset   Congestive Heart Failure Mother    Atrial fibrillation Mother    Hiatal hernia Mother    Cancer Father        lung   Heart attack Brother    Stroke Paternal Grandfather    Diabetes Paternal Grandfather    Alzheimer's disease Paternal Grandmother     Social History   Socioeconomic History   Marital status: Married    Spouse name: Not on file   Number of children: Not on file   Years of education: Not on file   Highest education level: Not on file  Occupational History   Occupation: Production manager  Tobacco Use   Smoking status: Former    Packs/day: 3.00    Years: 10.00    Additional pack years: 0.00    Total pack years: 30.00    Types: Cigarettes    Quit date: 05/09/1999    Years since quitting: 23.6    Passive exposure: Current   Smokeless tobacco: Never   Tobacco comments:    "quit smoking in ~ 2003"  Vaping Use   Vaping Use: Never used  Substance and Sexual Activity   Alcohol use: No    Comment: 02/21/2016 "I used to drink a  little bit; probably quit in my 20s"   Drug use: No   Sexual activity: Yes    Birth control/protection: Surgical    Comment: hyst  Other Topics Concern   Not on file  Social History Narrative   Walks but no other activity    Anxiety   Social Determinants of Health   Financial Resource Strain: Not on file  Food Insecurity: Not on file  Transportation Needs: Not on file  Physical Activity: Not on file  Stress: Not on file  Social Connections: Not on file   Review of systems General: negative for malaise, night sweats, fever, chills, weight loss Neck: Negative for lumps, goiter, pain and significant neck swelling Resp: Negative for cough, wheezing, dyspnea at rest CV: Negative for chest pain, leg swelling, palpitations, orthopnea GI: denies melena, hematochezia,vomiting, diarrhea, odyonophagia, early satiety or unintentional weight loss. +dysphagia +nausea +constipation  MSK: Negative for joint pain or swelling, back pain, and muscle pain. Derm: Negative for itching or rash Psych: Denies depression, anxiety, memory loss, confusion. No homicidal or suicidal ideation.  Heme: Negative for prolonged bleeding, bruising easily, and swollen nodes. Endocrine: Negative for cold or heat intolerance, polyuria, polydipsia and goiter. Neuro: negative for tremor, gait imbalance, syncope and seizures. The remainder of the review of systems is noncontributory.  Physical Exam: BP  118/76 (BP Location: Left Arm, Patient Position: Sitting, Cuff Size: Normal)   Pulse 71   Temp 99.2 F (37.3 C) (Oral)   Ht 5\' 7"  (1.702 m)   Wt 148 lb (67.1 kg)   BMI 23.18 kg/m  General:   Alert and oriented. No distress noted. Pleasant and cooperative.  Head:  Normocephalic and atraumatic. Eyes:  Conjuctiva clear without scleral icterus. Mouth:  Oral mucosa pink and moist. Good dentition. No lesions. Heart: Normal rate and rhythm, s1 and s2 heart sounds present.  Lungs: Clear lung sounds in all lobes.  Respirations equal and unlabored. Abdomen:  +BS, soft, non-tender and non-distended. No rebound or guarding. No HSM or masses noted. Derm: No palmar erythema or jaundice Msk:  Symmetrical without gross deformities. Normal posture. Extremities:  Without edema. Neurologic:  Alert and  oriented x4 Psych:  Alert and cooperative. Normal mood and affect.  Invalid input(s): "6 MONTHS"   ASSESSMENT: Kathryn [name] is a 67 y.o. female presenting today for follow up  GERD: well controlled on nexium though she feels it is expensive and not well covered on her insurance, requesting something that is less OOP, she is unsure if other PPIs have worked in the past, will stop nexium and start omeprazole 40mg  daily, she can let me know if this is not controlling her symptoms.   Dysphagia: chronic, intermittent, usually more with foods. Had extensive investigations in the past including pH impedance, esophageal manometry, endoscopy that did not show any major abnormalities. Should continue with chewing precautions and avoiding foods that tend to cause her issues.   Nausea: chronic, intermittent, usually more at night. Upper endoscopy has not been remarkable for etiology of her nausea. GES/CT A/P discussed previously though patient declined. She has actually gained some weight. Would suggest proceeding with GES, I discussed GES with her, she will discuss with her husband and let me know how she wants to proceed  Constipation: doing better with use of kefer, having a BM almost daily, still some harder stool balls. Encouraged to Increase water intake, aim for atleast 64 oz per day Increase fruits, veggies and whole grains, kiwi and prunes are especially good for constipation   PLAN:  Increase water intake, aim for atleast 64 oz per day.Increase fruits, veggies and whole grains, kiwi and prunes are especially good for constipation 2. Stop nexium, start omeprazole 40mg  daily, good reflux precautions  3. Continue  with kefir with good results 4. Consider GES -pt to let me know if she wishes to proceed  5. Chewing precautions  All questions were answered, patient verbalized understanding and is in agreement with plan as outlined above.   Follow Up: 3 months   Payslie Mccaig L. Jeanmarie Hubert, MSN, APRN, AGNP-C Adult-Gerontology Nurse Practitioner [facility]  I have reviewed the note and agree with the APP's assessment as described in this progress note  Extensive investigations in the past for evaluation of dysphagia.  Only abnormality found was esophagram showing possible age-related dysmotility but no presence of abnormalities in manometry performed in 2018.  Katrinka Blazing, MD Gastroenterology and Hepatology [facility]

## 2022-12-29 NOTE — Telephone Encounter (Signed)
Pt called back and states she wants to stay on current PPI esomeprazole and wanted a 90 day supply. Called [facility] and canceled omeprazole and sent in new rx for esomeprazole 90 day supply to pharmacy. Pt is aware.

## 2022-12-31 DIAGNOSIS — J301 Allergic rhinitis due to pollen: Secondary | ICD-10-CM | POA: Diagnosis not present

## 2022-12-31 DIAGNOSIS — J3089 Other allergic rhinitis: Secondary | ICD-10-CM | POA: Diagnosis not present

## 2023-01-03 ENCOUNTER — Other Ambulatory Visit (INDEPENDENT_AMBULATORY_CARE_PROVIDER_SITE_OTHER): Payer: Self-pay | Admitting: Gastroenterology

## 2023-01-05 DIAGNOSIS — J302 Other seasonal allergic rhinitis: Secondary | ICD-10-CM | POA: Diagnosis not present

## 2023-01-05 DIAGNOSIS — K219 Gastro-esophageal reflux disease without esophagitis: Secondary | ICD-10-CM | POA: Diagnosis not present

## 2023-01-05 DIAGNOSIS — E782 Mixed hyperlipidemia: Secondary | ICD-10-CM | POA: Diagnosis not present

## 2023-01-05 DIAGNOSIS — E039 Hypothyroidism, unspecified: Secondary | ICD-10-CM | POA: Diagnosis not present

## 2023-01-06 DIAGNOSIS — H5203 Hypermetropia, bilateral: Secondary | ICD-10-CM | POA: Diagnosis not present

## 2023-01-07 DIAGNOSIS — J3081 Allergic rhinitis due to animal (cat) (dog) hair and dander: Secondary | ICD-10-CM | POA: Diagnosis not present

## 2023-01-07 DIAGNOSIS — J3089 Other allergic rhinitis: Secondary | ICD-10-CM | POA: Diagnosis not present

## 2023-01-07 DIAGNOSIS — J301 Allergic rhinitis due to pollen: Secondary | ICD-10-CM | POA: Diagnosis not present

## 2023-01-15 DIAGNOSIS — J3081 Allergic rhinitis due to animal (cat) (dog) hair and dander: Secondary | ICD-10-CM | POA: Diagnosis not present

## 2023-01-15 DIAGNOSIS — J301 Allergic rhinitis due to pollen: Secondary | ICD-10-CM | POA: Diagnosis not present

## 2023-01-15 DIAGNOSIS — J3089 Other allergic rhinitis: Secondary | ICD-10-CM | POA: Diagnosis not present

## 2023-01-21 ENCOUNTER — Encounter: Payer: Self-pay | Admitting: Neurology

## 2023-01-21 ENCOUNTER — Ambulatory Visit: Payer: Medicare HMO | Admitting: Neurology

## 2023-01-21 VITALS — BP 112/68 | Ht 67.0 in | Wt 147.0 lb

## 2023-01-21 DIAGNOSIS — G459 Transient cerebral ischemic attack, unspecified: Secondary | ICD-10-CM

## 2023-01-21 DIAGNOSIS — R569 Unspecified convulsions: Secondary | ICD-10-CM

## 2023-01-21 DIAGNOSIS — J3089 Other allergic rhinitis: Secondary | ICD-10-CM | POA: Diagnosis not present

## 2023-01-21 DIAGNOSIS — J3081 Allergic rhinitis due to animal (cat) (dog) hair and dander: Secondary | ICD-10-CM | POA: Diagnosis not present

## 2023-01-21 DIAGNOSIS — J301 Allergic rhinitis due to pollen: Secondary | ICD-10-CM | POA: Diagnosis not present

## 2023-01-21 MED ORDER — ROSUVASTATIN CALCIUM 5 MG PO TABS: 5.0000 mg | ORAL_TABLET | Freq: Every evening | ORAL | 3 refills | Status: AC

## 2023-01-21 NOTE — Progress Notes (Signed)
[facility]  PATIENT: Kathryn [name] DOB: 06/01/1956  REQUESTING CLINICIAN: Assunta Found, MD HISTORY FROM: Patient and husband  REASON FOR VISIT: TIA/ Episodes of confusion    HISTORICAL  CHIEF COMPLAINT:  Chief Complaint  Patient presents with   New Patient (Initial Visit)    RM 13, alone, NP, TIA/Sz-not sure if it was sz, was reading and couldn't read anymore and couldn't recognize husband (10/23) fall in 1/23 , no other symptoms   INTERVAL HISTORY 01/21/2023:  Patient presents today for follow up, actually she was referred by her PCP for new TIA/seizure.  On further questioning, this was the same issues that were addressed will discuss back in December.  Patient was seen in the hospital in October for possible TIA/seizure.  Her workup was unrevealing including a normal EEG.  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  I saw her in December, recommended her to continue with aspirin and statin and send her to neurosurgery.  She did have a diagnostic cerebral angiogram which showed no evidence of aneurysm.  She has not had any new symptoms since her admission in the hospital in October.  Plan for patient will be to continue with aspirin and low-dose Crestor.  She reported she has been inconsistent with the medication.  No new symptoms    HISTORY OF PRESENT ILLNESS:  This is a 67 year old woman past medical history of anxiety/depression, GERD, restless leg syndrome, hypothyroidism who is presenting after being seen in the hospital for TIA. Patient reports presenting to the ED with TIA due to episode of altered mental status.  She reports that she was in the bathroom but could not read, the words were not coming out and she could not understand the words.  She got out of the bathroom and did not recognize her husband.  Husband was stating that she was asking who he was, and who her son was.  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  Upon discharge from the hospital she also have a 30-day cardiac monitor, she sent back to monitor but resolved were not communicated to  Patient report in the past, she had 3 episodes where she had acute confusion, words not coming out, and sometimes, the episodes may be associated with headaches. She states during these episodes, she can not think straight.  In the hospital, she had a routine EEG which was negative for any abnormal discharge.   Hospital Course: Kathryn [name] is a 67 y.o. female with medical history significant of with past medical history of hypothyroidism, anxiety/depression, gastroesophageal reflux disease, history of asthma, restless leg syndrome chronic pain; who presented to the hospital secondary to altered mental status.  Patient reports last seen normal around 7 PM on 06/16/2022; she went to the bathroom and noticing difficulties trying to read her book as otherwise she normally will be able to do.  Patient noticed difficulty getting words out and when interacting with family was unable to remember the name of her husband and her children.  Patient reported having some right-sided numbness sensation associated headache.  After discussing with her husband the symptoms patient was brought to the emergency department for further evaluation and management.  Overall symptoms lasted about 45 minutes.  Patient presented past 4 hours after the incident.  She reported experiencing similar episodes in the past but the previous one did not  last as long as the current event.   Patient reports no chest pain, no nausea, no vomiting, no focal motor deficits, no fever or chills, no sick contacts or any other complaints.   In the ED work-up demonstrated no signs of acute infection, CT scan of the head without acute intracranial abnormalities and CT angio of the head and neck without large vessel obstruction.  Case  discussed with teleneurology who recommended admission for TIA work-up.  TRH consulted to place patient in the hospital for further evaluation and management.  OTHER MEDICAL CONDITIONS: Anxiety/depression, GERD, restless leg syndrome, hypothyroidism   REVIEW OF SYSTEMS: Full 14 system review of systems performed and negative with exception of: As noted in the HPI   ALLERGIES: Allergies  Allergen Reactions   Niacin And Related Shortness Of Breath, Swelling and Other (See Comments)    Neck; skin hot   Codeine Swelling and Other (See Comments)    tongue   Zinc Swelling    HOME MEDICATIONS: Outpatient Medications Prior to Visit  Medication Sig Dispense Refill   acetaminophen (TYLENOL) 500 MG tablet Take 500-1,000 mg by mouth every 6 (six) hours as needed (For pain.).     albuterol (PROVENTIL HFA;VENTOLIN HFA) 108 (90 Base) MCG/ACT inhaler Inhale 2 puffs into the lungs every 4 (four) hours as needed for wheezing or shortness of breath.     ALPRAZolam (XANAX) 0.5 MG tablet Take 0.5 mg by mouth daily as needed for anxiety.     Biotin 08657 MCG TABS Take 10,000 mcg by mouth daily.      Cholecalciferol (VITAMIN D-3) 125 MCG (5000 UT) TABS Take 5,000 Units by mouth daily.      cyanocobalamin (VITAMIN B12) 1000 MCG tablet Take 1 tablet (1,000 mcg total) by mouth daily. 30 tablet 2   EPINEPHrine 0.3 mg/0.3 mL IJ SOAJ injection Inject 0.3 mg into the skin as needed for anaphylaxis.     escitalopram (LEXAPRO) 20 MG tablet Take 20 mg by mouth daily.     esomeprazole (NEXIUM) 40 MG capsule TAKE 1 CAPSULE(40 MG) BY MOUTH TWICE DAILY BEFORE A MEAL 180 capsule 0   GABAPENTIN PO Take by mouth. Patient unsure of dose but takes up to three times per day for restless legs     levothyroxine (SYNTHROID) 25 MCG tablet Take 25 mcg by mouth daily before breakfast.     loratadine (CLARITIN) 10 MG tablet Take 10 mg by mouth daily.     montelukast (SINGULAIR) 10 MG tablet Take 10 mg by mouth every morning.       rOPINIRole (REQUIP) 4 MG tablet Take 6 mg by mouth daily. 4 - 6 mg as needed     valACYclovir (VALTREX) 1000 MG tablet Take 1,000 mg by mouth daily.     fluticasone (FLONASE) 50 MCG/ACT nasal spray Place 2 sprays into both nostrils daily as needed for allergies or rhinitis. (Patient not taking: Reported on 01/21/2023)     ibuprofen (ADVIL) 200 MG tablet Take 800 mg by mouth every 6 (six) hours as needed for moderate pain. (Patient not taking: Reported on 01/21/2023)     traMADol (ULTRAM) 50 MG tablet Take 50-100 mg by mouth every 12 (twelve) hours as needed for severe pain.  (Patient not taking: Reported on 01/21/2023)     No facility-administered medications prior to visit.    PAST MEDICAL HISTORY: Past Medical History:  Diagnosis Date   Adrenal insufficiency (HCC)    occured following steroid shoulder injections ~ spring 2017  Anxiety    pt. reports that she has RLS- takes Xanax for calming her self so she can settle & sleep    Arthritis    shoulder & back, knees & leg     Asthma    aleery related   Atypical mole 11/12/2020   mod-severe- mid back   Chest pain, atypical    had cardiac workup - deemed related to anxiety   Chronic bronchitis (HCC)    "less since I quit smoking" (02/21/2016)   Chronic lower back pain    Dyspnea    related to spray paint that is put in  beer cans at her place of employment.  Pt. also reports that she has anxiety also that leads to SOB., Pt. reports her Mother just died a week ago     GERD (gastroesophageal reflux disease)    Guillain-Barre syndrome following vaccination (HCC) 1970's   post swine flu shot, hosp. for treatment for 1 month    H/O dizziness    H/O hiatal hernia    Hepatitis    hepatitis as a child; "got it from a little girl at school" make sick and vomiting   Herpes simplex    History of Guillain-Barre syndrome ~ 1978   "from the swine flu shot"   IBS (irritable bowel syndrome)    Pneumonia    PONV (postoperative nausea and vomiting)     Restless leg syndrome    Walking pneumonia ~ 2011    PAST SURGICAL HISTORY: Past Surgical History:  Procedure Laterality Date   22 HOUR PH STUDY N/A 09/23/2016   Procedure: 24 HOUR PH STUDY;  Surgeon: Napoleon Form, MD;  Location: [facility];  Service: Endoscopy;  Laterality: N/A;   ANTERIOR CERVICAL DECOMP/DISCECTOMY FUSION  2000 & 2015   two times ago- C5 and C6, Dr. Clydene Fake, MD 2015   APPENDECTOMY  1980s   BACK SURGERY     BIOPSY  03/24/2022   Procedure: BIOPSY;  Surgeon: Dolores Frame, MD;  Location: [facility];  Service: Gastroenterology;;   CHOLECYSTECTOMY OPEN  1980s   COLONOSCOPY N/A 09/20/2014   rehman: Prep was excellent except she had thick layer of stool coating cecal and ascending colon mucosa. Cecal landmarks were well identified after vigorous washing. Small lymphoid polyp ablated via cold biopsy from appendiceal stump.Mucosa of rest of the colon was normal. Normal mucosa of rectum and anorectal junction.   ESOPHAGEAL DILATION N/A 05/29/2019   Procedure: ESOPHAGEAL DILATION;  Surgeon: Malissa Hippo, MD;  Location: [facility];  Service: Endoscopy;  Laterality: N/A;   ESOPHAGEAL MANOMETRY N/A 09/23/2016   Procedure: ESOPHAGEAL MANOMETRY (EM);  Surgeon: Napoleon Form, MD;  Location: [facility];  Service: Endoscopy;  Laterality: N/A;   ESOPHAGOGASTRODUODENOSCOPY N/A 09/20/2014   Procedure: ESOPHAGOGASTRODUODENOSCOPY (EGD);  Surgeon: Malissa Hippo, MD;  [facility]: [facility];  Service: Endoscopy;  Laterality: N/A;   ESOPHAGOGASTRODUODENOSCOPY (EGD) WITH PROPOFOL N/A 05/29/2019   - Z-line, 38 cm from the incisors.   ESOPHAGOGASTRODUODENOSCOPY (EGD) WITH PROPOFOL N/A 03/24/2022   Procedure: ESOPHAGOGASTRODUODENOSCOPY (EGD) WITH PROPOFOL;  Surgeon: Dolores Frame, MD;  [facility];  Service: Gastroenterology;  Laterality: N/A;  730 ASA 1   HIATAL HERNIA REPAIR N/A 11/27/2016   Procedure: LAPAROSCOPIC REPAIR OF  HIATAL HERNIA WITH NISSEN FUNDOPLICATION;  Surgeon: Avel Peace, MD;  Location: [facility];  Service: General;  Laterality: N/A;   INCISION AND DRAINAGE OF WOUND Left 02/21/2016   forearm, minor  IR ANGIO INTRA EXTRACRAN SEL INTERNAL CAROTID BILAT MOD SED  11/03/2022   IR ANGIO VERTEBRAL SEL VERTEBRAL UNI L MOD SED  11/03/2022   LUMBAR LAMINECTOMY/DECOMPRESSION MICRODISCECTOMY Right 06/22/2013   Procedure: RIGHT LUMBAR TWO-THREE DISCECTOMY;  Surgeon: Clydene Fake, MD;  Location: [facility];  Service: Neurosurgery;  Laterality: Right;  Right    MALONEY DILATION N/A 09/20/2014   Procedure: Elease Hashimoto DILATION;  Surgeon: Malissa Hippo, MD;  [facility]: [facility];  Service: Endoscopy;  Laterality: N/A;   MINOR IRRIGATION AND DEBRIDEMENT OF WOUND Left 02/21/2016   Procedure: MINOR IRRIGATION AND DEBRIDEMENT/REPAIR OF LEFT ARM WOUND;  Surgeon: Beverely Low, MD;  Location: [facility];  Service: Orthopedics;  Laterality: Left;   REVERSE SHOULDER ARTHROPLASTY Right 02/21/2016   Procedure: RIGHT REVERSE TOTAL SHOULDER ARTHROPLASTY;  Surgeon: Beverely Low, MD;  [facility];  Service: Orthopedics;  Laterality: Right;   REVERSE TOTAL SHOULDER ARTHROPLASTY Right 02/21/2016   SAVORY DILATION  03/24/2022   Procedure: SAVORY DILATION;  Surgeon: Marguerita Merles, Reuel Boom, MD;  [facility]: [facility];  Service: Gastroenterology;;   SHOULDER ARTHROSCOPY W/ ROTATOR CUFF REPAIR Right 1990s   TOTAL SHOULDER REPLACEMENT     Right   VAGINAL HYSTERECTOMY      FAMILY HISTORY: Family History  Problem Relation Age of Onset   Congestive Heart Failure Mother    Atrial fibrillation Mother    Hiatal hernia Mother    Cancer Father        lung   Heart attack Brother    Stroke Paternal Grandfather    Diabetes Paternal Grandfather    Alzheimer's disease Paternal Grandmother     SOCIAL HISTORY: Social History   Socioeconomic History   Marital status: Married    Spouse name: Not on file   Number of children:  Not on file   Years of education: Not on file   Highest education level: Not on file  Occupational History   Occupation: Production manager  Tobacco Use   Smoking status: Former    Packs/day: 3.00    Years: 10.00    Additional pack years: 0.00    Total pack years: 30.00    Types: Cigarettes    Quit date: 05/09/1999    Years since quitting: 23.7    Passive exposure: Current   Smokeless tobacco: Never   Tobacco comments:    "quit smoking in ~ 2003"  Vaping Use   Vaping Use: Never used  Substance and Sexual Activity   Alcohol use: No    Comment: 02/21/2016 "I used to drink a little bit; probably quit in my 20s"   Drug use: No   Sexual activity: Yes    Birth control/protection: Surgical    Comment: hyst  Other Topics Concern   Not on file  Social History Narrative   Walks but no other activity    Anxiety   Right handed   Caffeine-64oz daily   Social Determinants of Health   Financial Resource Strain: Not on file  Food Insecurity: Not on file  Transportation Needs: Not on file  Physical Activity: Not on file  Stress: Not on file  Social Connections: Not on file  Intimate Partner Violence: Not on file    PHYSICAL EXAM  GENERAL EXAM/CONSTITUTIONAL: Vitals:  Vitals:   01/21/23 0800  BP: 112/68  Weight: 147 lb (66.7 kg)  Height: 5\' 7"  (1.702 m)   Body mass index is 23.02 kg/m. Wt Readings from Last 3 Encounters:  01/21/23 147 lb (66.7 kg)  12/29/22 148 lb (67.1 kg)  11/03/22 140 lb (63.5 kg)   Patient is in no distress; well developed, nourished and groomed; neck is supple  MUSCULOSKELETAL: Gait, strength, tone, movements noted in Neurologic exam below  NEUROLOGIC: MENTAL STATUS:      No data to display         awake, alert, oriented to person, place and time recent and remote memory intact normal attention and concentration language fluent, comprehension intact, naming intact fund of knowledge appropriate  CRANIAL NERVE:  2nd, 3rd, 4th, 6th - Visual  fields full to confrontation, extraocular muscles intact, no nystagmus 5th - facial sensation symmetric 7th - facial strength symmetric 8th - hearing intact 9th - palate elevates symmetrically, uvula midline 11th - shoulder shrug symmetric 12th - tongue protrusion midline  MOTOR:  normal bulk and tone, full strength in the BUE, BLE  SENSORY:  normal and symmetric to light touch  COORDINATION:  finger-nose-finger, fine finger movements normal  REFLEXES:  deep tendon reflexes present and symmetric  GAIT/STATION:  normal   DIAGNOSTIC DATA (LABS, IMAGING, TESTING) - I reviewed patient records, labs, notes, testing and imaging myself where available.  Lab Results  Component Value Date   WBC 4.3 11/03/2022   HGB 13.0 11/03/2022   HCT 37.3 11/03/2022   MCV 95.9 11/03/2022   PLT 249 11/03/2022      Component Value Date/Time   NA 132 (L) 11/03/2022 0640   K 4.2 11/03/2022 0640   CL 99 11/03/2022 0640   CO2 26 11/03/2022 0640   GLUCOSE 91 11/03/2022 0640   BUN 14 11/03/2022 0640   CREATININE 1.07 (H) 11/03/2022 0640   CREATININE 0.93 04/28/2019 1347   CALCIUM 9.2 11/03/2022 0640   PROT 5.9 (L) 06/18/2022 0459   ALBUMIN 3.2 (L) 06/18/2022 0459   AST 54 (H) 06/18/2022 0459   ALT 29 06/18/2022 0459   ALKPHOS 44 06/18/2022 0459   BILITOT 0.5 06/18/2022 0459   GFRNONAA 57 (L) 11/03/2022 0640   GFRAA >60 11/27/2016 2107   Lab Results  Component Value Date   CHOL 152 06/18/2022   HDL 52 06/18/2022   LDLCALC 84 06/18/2022   TRIG 82 06/18/2022   CHOLHDL 2.9 06/18/2022   Lab Results  Component Value Date   HGBA1C 5.3 06/17/2022   Lab Results  Component Value Date   VITAMINB12 324 06/17/2022   Lab Results  Component Value Date   TSH 0.682 06/17/2022    MRI Brain 06/17/2022 No evidence of acute intracranial abnormality   Head CT 06/16/2022 1. No acute intracranial abnormality. 2. ASPECTS is 10  CTA Head and Neck 06/16/2022 1. No emergent large vessel  occlusion or high-grade stenosis of the intracranial arteries. 2. 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  Routine EEG 06/17/2022 Normal   ASSESSMENT AND PLAN  67 y.o. year old female with anxiety/depression, GERD, hypothyroidism, restless leg syndrome who is presenting for follow up TIA/Sz. No new symptoms since our last visit in December. She reports being inconsistent with the aspirin and statin. Recommend her to continue with Aspirin 81 mg daily and restart Crestor 5 mg nightly. Continue to follow up with PCP and return as needed.    1. TIA (transient ischemic attack)   2. Seizure-like activity [facility])       Patient Instructions  Continue with Aspirin 81 mg daily and crestor 5 mg nightly  Continue to follow up with PCP  Return as needed   No  orders of the defined types were placed in this encounter.   Meds ordered this encounter  Medications   rosuvastatin (CRESTOR) 5 MG tablet    Sig: Take 1 tablet (5 mg total) by mouth at bedtime.    Dispense:  90 tablet    Refill:  3    Return if symptoms worsen or fail to improve.   Windell Norfolk, MD 01/21/2023, 8:30 AM  [facility] [address] [place], [place] [address] [phone]

## 2023-01-21 NOTE — Patient Instructions (Signed)
Continue with Aspirin 81 mg daily and crestor 5 mg nightly  Continue to follow up with PCP  Return as needed

## 2023-01-27 ENCOUNTER — Other Ambulatory Visit (HOSPITAL_COMMUNITY): Payer: Self-pay | Admitting: Family Medicine

## 2023-01-27 DIAGNOSIS — R911 Solitary pulmonary nodule: Secondary | ICD-10-CM

## 2023-01-28 DIAGNOSIS — Z7189 Other specified counseling: Secondary | ICD-10-CM | POA: Diagnosis not present

## 2023-01-28 DIAGNOSIS — D485 Neoplasm of uncertain behavior of skin: Secondary | ICD-10-CM | POA: Diagnosis not present

## 2023-01-28 DIAGNOSIS — D225 Melanocytic nevi of trunk: Secondary | ICD-10-CM | POA: Diagnosis not present

## 2023-01-28 DIAGNOSIS — D2261 Melanocytic nevi of right upper limb, including shoulder: Secondary | ICD-10-CM | POA: Diagnosis not present

## 2023-01-28 DIAGNOSIS — L718 Other rosacea: Secondary | ICD-10-CM | POA: Diagnosis not present

## 2023-01-28 DIAGNOSIS — J301 Allergic rhinitis due to pollen: Secondary | ICD-10-CM | POA: Diagnosis not present

## 2023-01-28 DIAGNOSIS — L821 Other seborrheic keratosis: Secondary | ICD-10-CM | POA: Diagnosis not present

## 2023-01-28 DIAGNOSIS — L814 Other melanin hyperpigmentation: Secondary | ICD-10-CM | POA: Diagnosis not present

## 2023-01-28 DIAGNOSIS — L570 Actinic keratosis: Secondary | ICD-10-CM | POA: Diagnosis not present

## 2023-01-28 DIAGNOSIS — I788 Other diseases of capillaries: Secondary | ICD-10-CM | POA: Diagnosis not present

## 2023-01-28 DIAGNOSIS — J3089 Other allergic rhinitis: Secondary | ICD-10-CM | POA: Diagnosis not present

## 2023-01-28 DIAGNOSIS — J3081 Allergic rhinitis due to animal (cat) (dog) hair and dander: Secondary | ICD-10-CM | POA: Diagnosis not present

## 2023-02-05 DIAGNOSIS — J3089 Other allergic rhinitis: Secondary | ICD-10-CM | POA: Diagnosis not present

## 2023-02-05 DIAGNOSIS — J4530 Mild persistent asthma, uncomplicated: Secondary | ICD-10-CM | POA: Diagnosis not present

## 2023-02-05 DIAGNOSIS — H1045 Other chronic allergic conjunctivitis: Secondary | ICD-10-CM | POA: Diagnosis not present

## 2023-02-05 DIAGNOSIS — J301 Allergic rhinitis due to pollen: Secondary | ICD-10-CM | POA: Diagnosis not present

## 2023-02-05 DIAGNOSIS — J3081 Allergic rhinitis due to animal (cat) (dog) hair and dander: Secondary | ICD-10-CM | POA: Diagnosis not present

## 2023-02-08 DIAGNOSIS — E2749 Other adrenocortical insufficiency: Secondary | ICD-10-CM | POA: Diagnosis not present

## 2023-02-08 DIAGNOSIS — E039 Hypothyroidism, unspecified: Secondary | ICD-10-CM | POA: Diagnosis not present

## 2023-02-12 DIAGNOSIS — J301 Allergic rhinitis due to pollen: Secondary | ICD-10-CM | POA: Diagnosis not present

## 2023-02-12 DIAGNOSIS — J3081 Allergic rhinitis due to animal (cat) (dog) hair and dander: Secondary | ICD-10-CM | POA: Diagnosis not present

## 2023-02-12 DIAGNOSIS — J3089 Other allergic rhinitis: Secondary | ICD-10-CM | POA: Diagnosis not present

## 2023-02-16 DIAGNOSIS — E039 Hypothyroidism, unspecified: Secondary | ICD-10-CM | POA: Diagnosis not present

## 2023-02-16 DIAGNOSIS — K219 Gastro-esophageal reflux disease without esophagitis: Secondary | ICD-10-CM | POA: Diagnosis not present

## 2023-02-16 DIAGNOSIS — E559 Vitamin D deficiency, unspecified: Secondary | ICD-10-CM | POA: Diagnosis not present

## 2023-02-16 DIAGNOSIS — E2749 Other adrenocortical insufficiency: Secondary | ICD-10-CM | POA: Diagnosis not present

## 2023-02-19 DIAGNOSIS — J301 Allergic rhinitis due to pollen: Secondary | ICD-10-CM | POA: Diagnosis not present

## 2023-02-19 DIAGNOSIS — J3089 Other allergic rhinitis: Secondary | ICD-10-CM | POA: Diagnosis not present

## 2023-02-26 DIAGNOSIS — J3089 Other allergic rhinitis: Secondary | ICD-10-CM | POA: Diagnosis not present

## 2023-02-26 DIAGNOSIS — J3081 Allergic rhinitis due to animal (cat) (dog) hair and dander: Secondary | ICD-10-CM | POA: Diagnosis not present

## 2023-02-26 DIAGNOSIS — J301 Allergic rhinitis due to pollen: Secondary | ICD-10-CM | POA: Diagnosis not present

## 2023-03-01 ENCOUNTER — Ambulatory Visit (INDEPENDENT_AMBULATORY_CARE_PROVIDER_SITE_OTHER): Payer: PPO | Admitting: Gastroenterology

## 2023-03-03 ENCOUNTER — Other Ambulatory Visit (HOSPITAL_COMMUNITY): Payer: Self-pay | Admitting: Family Medicine

## 2023-03-03 DIAGNOSIS — Z1231 Encounter for screening mammogram for malignant neoplasm of breast: Secondary | ICD-10-CM

## 2023-03-04 DIAGNOSIS — J3081 Allergic rhinitis due to animal (cat) (dog) hair and dander: Secondary | ICD-10-CM | POA: Diagnosis not present

## 2023-03-04 DIAGNOSIS — J301 Allergic rhinitis due to pollen: Secondary | ICD-10-CM | POA: Diagnosis not present

## 2023-03-04 DIAGNOSIS — J3089 Other allergic rhinitis: Secondary | ICD-10-CM | POA: Diagnosis not present

## 2023-03-08 ENCOUNTER — Other Ambulatory Visit (HOSPITAL_COMMUNITY): Payer: Self-pay | Admitting: Family Medicine

## 2023-03-08 ENCOUNTER — Ambulatory Visit (HOSPITAL_COMMUNITY)
Admission: RE | Admit: 2023-03-08 | Discharge: 2023-03-08 | Disposition: A | Discharge status: Home / Self Care | Payer: Medicare HMO | Source: Ambulatory Visit | Attending: Family Medicine | Admitting: Family Medicine

## 2023-03-08 DIAGNOSIS — M25472 Effusion, left ankle: Secondary | ICD-10-CM

## 2023-03-08 DIAGNOSIS — M7989 Other specified soft tissue disorders: Secondary | ICD-10-CM | POA: Diagnosis not present

## 2023-03-08 DIAGNOSIS — Z6824 Body mass index (BMI) 24.0-24.9, adult: Secondary | ICD-10-CM | POA: Diagnosis not present

## 2023-03-08 DIAGNOSIS — M7732 Calcaneal spur, left foot: Secondary | ICD-10-CM | POA: Diagnosis not present

## 2023-03-08 DIAGNOSIS — M19072 Primary osteoarthritis, left ankle and foot: Secondary | ICD-10-CM | POA: Diagnosis not present

## 2023-03-10 DIAGNOSIS — J3081 Allergic rhinitis due to animal (cat) (dog) hair and dander: Secondary | ICD-10-CM | POA: Diagnosis not present

## 2023-03-10 DIAGNOSIS — J301 Allergic rhinitis due to pollen: Secondary | ICD-10-CM | POA: Diagnosis not present

## 2023-03-10 DIAGNOSIS — J3089 Other allergic rhinitis: Secondary | ICD-10-CM | POA: Diagnosis not present

## 2023-03-15 ENCOUNTER — Encounter (HOSPITAL_COMMUNITY): Payer: Self-pay

## 2023-03-15 ENCOUNTER — Ambulatory Visit (HOSPITAL_COMMUNITY)
Admission: RE | Admit: 2023-03-15 | Discharge: 2023-03-15 | Disposition: A | Discharge status: Home / Self Care | Payer: Medicare HMO | Source: Ambulatory Visit | Attending: Family Medicine | Admitting: Family Medicine

## 2023-03-15 DIAGNOSIS — R911 Solitary pulmonary nodule: Secondary | ICD-10-CM | POA: Diagnosis not present

## 2023-03-15 DIAGNOSIS — J432 Centrilobular emphysema: Secondary | ICD-10-CM | POA: Insufficient documentation

## 2023-03-15 DIAGNOSIS — Z87891 Personal history of nicotine dependence: Secondary | ICD-10-CM | POA: Insufficient documentation

## 2023-03-15 DIAGNOSIS — Z122 Encounter for screening for malignant neoplasm of respiratory organs: Secondary | ICD-10-CM | POA: Insufficient documentation

## 2023-03-15 DIAGNOSIS — Z1231 Encounter for screening mammogram for malignant neoplasm of breast: Secondary | ICD-10-CM | POA: Diagnosis not present

## 2023-03-19 DIAGNOSIS — J3089 Other allergic rhinitis: Secondary | ICD-10-CM | POA: Diagnosis not present

## 2023-03-19 DIAGNOSIS — J301 Allergic rhinitis due to pollen: Secondary | ICD-10-CM | POA: Diagnosis not present

## 2023-03-19 DIAGNOSIS — J3081 Allergic rhinitis due to animal (cat) (dog) hair and dander: Secondary | ICD-10-CM | POA: Diagnosis not present

## 2023-03-26 DIAGNOSIS — J3081 Allergic rhinitis due to animal (cat) (dog) hair and dander: Secondary | ICD-10-CM | POA: Diagnosis not present

## 2023-03-26 DIAGNOSIS — J3089 Other allergic rhinitis: Secondary | ICD-10-CM | POA: Diagnosis not present

## 2023-03-26 DIAGNOSIS — J301 Allergic rhinitis due to pollen: Secondary | ICD-10-CM | POA: Diagnosis not present

## 2023-04-01 ENCOUNTER — Ambulatory Visit (INDEPENDENT_AMBULATORY_CARE_PROVIDER_SITE_OTHER): Payer: PPO | Admitting: Gastroenterology

## 2023-04-05 DIAGNOSIS — J3089 Other allergic rhinitis: Secondary | ICD-10-CM | POA: Diagnosis not present

## 2023-04-05 DIAGNOSIS — J301 Allergic rhinitis due to pollen: Secondary | ICD-10-CM | POA: Diagnosis not present

## 2023-04-05 DIAGNOSIS — J3081 Allergic rhinitis due to animal (cat) (dog) hair and dander: Secondary | ICD-10-CM | POA: Diagnosis not present

## 2023-04-14 DIAGNOSIS — J301 Allergic rhinitis due to pollen: Secondary | ICD-10-CM | POA: Diagnosis not present

## 2023-04-14 DIAGNOSIS — J3081 Allergic rhinitis due to animal (cat) (dog) hair and dander: Secondary | ICD-10-CM | POA: Diagnosis not present

## 2023-04-14 DIAGNOSIS — J3089 Other allergic rhinitis: Secondary | ICD-10-CM | POA: Diagnosis not present

## 2023-04-14 DIAGNOSIS — M5136 Other intervertebral disc degeneration, lumbar region: Secondary | ICD-10-CM | POA: Diagnosis not present

## 2023-04-14 DIAGNOSIS — M25472 Effusion, left ankle: Secondary | ICD-10-CM | POA: Diagnosis not present

## 2023-04-14 DIAGNOSIS — E782 Mixed hyperlipidemia: Secondary | ICD-10-CM | POA: Diagnosis not present

## 2023-04-14 DIAGNOSIS — K219 Gastro-esophageal reflux disease without esophagitis: Secondary | ICD-10-CM | POA: Diagnosis not present

## 2023-04-14 DIAGNOSIS — G2581 Restless legs syndrome: Secondary | ICD-10-CM | POA: Diagnosis not present

## 2023-04-14 DIAGNOSIS — Z6823 Body mass index (BMI) 23.0-23.9, adult: Secondary | ICD-10-CM | POA: Diagnosis not present

## 2023-04-14 DIAGNOSIS — E271 Primary adrenocortical insufficiency: Secondary | ICD-10-CM | POA: Diagnosis not present

## 2023-04-20 DIAGNOSIS — J301 Allergic rhinitis due to pollen: Secondary | ICD-10-CM | POA: Diagnosis not present

## 2023-04-20 DIAGNOSIS — J3089 Other allergic rhinitis: Secondary | ICD-10-CM | POA: Diagnosis not present

## 2023-04-27 DIAGNOSIS — J3081 Allergic rhinitis due to animal (cat) (dog) hair and dander: Secondary | ICD-10-CM | POA: Diagnosis not present

## 2023-04-27 DIAGNOSIS — J3089 Other allergic rhinitis: Secondary | ICD-10-CM | POA: Diagnosis not present

## 2023-04-27 DIAGNOSIS — J301 Allergic rhinitis due to pollen: Secondary | ICD-10-CM | POA: Diagnosis not present

## 2023-04-28 ENCOUNTER — Encounter: Payer: Self-pay | Admitting: Orthopedic Surgery

## 2023-04-28 ENCOUNTER — Ambulatory Visit: Payer: Medicare HMO | Admitting: Orthopedic Surgery

## 2023-04-28 VITALS — BP 117/72 | HR 72 | Ht 67.0 in | Wt 153.0 lb

## 2023-04-28 DIAGNOSIS — M25572 Pain in left ankle and joints of left foot: Secondary | ICD-10-CM | POA: Diagnosis not present

## 2023-04-28 MED ORDER — IBUPROFEN 800 MG PO TABS: 800.0000 mg | ORAL_TABLET | Freq: Three times a day (TID) | ORAL | 0 refills | Status: AC | PRN

## 2023-04-28 NOTE — Progress Notes (Signed)
New Patient Visit  Assessment: Kathryn [name] is a 67 y.o. female with the following: 1. Pain in left ankle and joints of left foot  Plan: Kathryn [name] has pain and swelling in the left ankle.  She does have a history of prior injury many years ago, but does not note recent injury.  She states it is possible that she rolled her left ankle a few months ago.  Radiographs were previously obtained, and there are no acute injuries.  She may have some tibiotalar arthritis, but limited pain in that joint currently.  We discussed multiple treatment options for her to try at home.  I provided her with home exercises.  She can take ibuprofen for pain, and I provided her with a prescription.  She can get a brace at a local pharmacy.  If she continues to have issues, we can consider formal physical therapy.  We could also try some injections.  She states understanding.  She will follow-up as needed.  Follow-up: Return if symptoms worsen or fail to improve.  Subjective:  Chief Complaint  Patient presents with   Ankle Pain    L ankle pain and swelling for 3 mos. NKI has had injections for inflammation but has done very little to help with pain and swelling.     History of Present Illness: Kathryn [name] is a 67 y.o. female who has been referred by Elfredia Nevins, MD for evaluation of left ankle pain.  She reports that she has had pain in the left ankle for the past 3 months.  She does not remember a specific injury.  However, she states it is possible that she rolled her ankle and did not realize it.  She has pain over the lateral ankle, radiating to the dorsum of the foot.  She does report that she had a bad ankle sprain more than 30 years ago.  However, nothing since then.  She reports that she had none "inflammation shot" with limited improvement in her symptoms.  She has not done any physical therapy.  She takes ibuprofen as needed.  She does have some occasional numbness and tingling in the left foot.   She is not sure what is causing the swelling.   Review of Systems: No fevers or chills No numbness or tingling No chest pain No shortness of breath No bowel or bladder dysfunction No GI distress No headaches   Medical History:  Past Medical History:  Diagnosis Date   Adrenal insufficiency (HCC)    occured following steroid shoulder injections ~ spring 2017   Anxiety    pt. reports that she has RLS- takes Xanax for calming her self so she can settle & sleep    Arthritis    shoulder & back, knees & leg     Asthma    aleery related   Atypical mole 11/12/2020   mod-severe- mid back   Chest pain, atypical    had cardiac workup - deemed related to anxiety   Chronic bronchitis (HCC)    "less since I quit smoking" (02/21/2016)   Chronic lower back pain    Dyspnea    related to spray paint that is put in  beer cans at her place of employment.  Pt. also reports that she has anxiety also that leads to SOB., Pt. reports her Mother just died a week ago     GERD (gastroesophageal reflux disease)    Guillain-Barre syndrome following vaccination (HCC) 1970's   post swine flu  shot, hosp. for treatment for 1 month    H/O dizziness    H/O hiatal hernia    Hepatitis    hepatitis as a child; "got it from a little girl at school" make sick and vomiting   Herpes simplex    History of Guillain-Barre syndrome ~ 1978   "from the swine flu shot"   IBS (irritable bowel syndrome)    Pneumonia    PONV (postoperative nausea and vomiting)    Restless leg syndrome    Walking pneumonia ~ 2011    Past Surgical History:  Procedure Laterality Date   8 HOUR PH STUDY N/A 09/23/2016   Procedure: 24 HOUR PH STUDY;  Surgeon: Napoleon Form, MD;  Location: [facility];  Service: Endoscopy;  Laterality: N/A;   ANTERIOR CERVICAL DECOMP/DISCECTOMY FUSION  2000 & 2015   two times ago- C5 and C6, Dr. Clydene Fake, MD 2015   APPENDECTOMY  1980s   BACK SURGERY     BIOPSY  03/24/2022   Procedure:  BIOPSY;  Surgeon: Dolores Frame, MD;  Location: [facility];  Service: Gastroenterology;;   BREAST BIOPSY Right    benign   CHOLECYSTECTOMY OPEN  1980s   COLONOSCOPY N/A 09/20/2014   rehman: Prep was excellent except she had thick layer of stool coating cecal and ascending colon mucosa. Cecal landmarks were well identified after vigorous washing. Small lymphoid polyp ablated via cold biopsy from appendiceal stump.Mucosa of rest of the colon was normal. Normal mucosa of rectum and anorectal junction.   ESOPHAGEAL DILATION N/A 05/29/2019   Procedure: ESOPHAGEAL DILATION;  Surgeon: Malissa Hippo, MD;  Location: [facility];  Service: Endoscopy;  Laterality: N/A;   ESOPHAGEAL MANOMETRY N/A 09/23/2016   Procedure: ESOPHAGEAL MANOMETRY (EM);  Surgeon: Napoleon Form, MD;  Location: [facility];  Service: Endoscopy;  Laterality: N/A;   ESOPHAGOGASTRODUODENOSCOPY N/A 09/20/2014   Procedure: ESOPHAGOGASTRODUODENOSCOPY (EGD);  Surgeon: Malissa Hippo, MD;  Location: [facility];  Service: Endoscopy;  Laterality: N/A;   ESOPHAGOGASTRODUODENOSCOPY (EGD) WITH PROPOFOL N/A 05/29/2019   - Z-line, 38 cm from the incisors.   ESOPHAGOGASTRODUODENOSCOPY (EGD) WITH PROPOFOL N/A 03/24/2022   Procedure: ESOPHAGOGASTRODUODENOSCOPY (EGD) WITH PROPOFOL;  Surgeon: Dolores Frame, MD;  [facility]: [facility];  Service: Gastroenterology;  Laterality: N/A;  730 ASA 1   HIATAL HERNIA REPAIR N/A 11/27/2016   Procedure: LAPAROSCOPIC REPAIR OF HIATAL HERNIA WITH NISSEN FUNDOPLICATION;  Surgeon: Avel Peace, MD;  Location: [facility];  Service: General;  Laterality: N/A;   INCISION AND DRAINAGE OF WOUND Left 02/21/2016   forearm, minor   IR ANGIO INTRA EXTRACRAN SEL INTERNAL CAROTID BILAT MOD SED  11/03/2022   IR ANGIO VERTEBRAL SEL VERTEBRAL UNI L MOD SED  11/03/2022   LUMBAR LAMINECTOMY/DECOMPRESSION MICRODISCECTOMY Right 06/22/2013   Procedure: RIGHT LUMBAR TWO-THREE DISCECTOMY;   Surgeon: Clydene Fake, MD;  Location: [facility];  Service: Neurosurgery;  Laterality: Right;  Right    MALONEY DILATION N/A 09/20/2014   Procedure: Elease Hashimoto DILATION;  Surgeon: Malissa Hippo, MD;  Location: [facility];  Service: Endoscopy;  Laterality: N/A;   MINOR IRRIGATION AND DEBRIDEMENT OF WOUND Left 02/21/2016   Procedure: MINOR IRRIGATION AND DEBRIDEMENT/REPAIR OF LEFT ARM WOUND;  Surgeon: Beverely Low, MD;  Location: [facility];  Service: Orthopedics;  Laterality: Left;   REVERSE SHOULDER ARTHROPLASTY Right 02/21/2016   Procedure: RIGHT REVERSE TOTAL SHOULDER ARTHROPLASTY;  Surgeon: Beverely Low, MD;  Location: [facility];  Service: Orthopedics;  Laterality:  Right;   REVERSE TOTAL SHOULDER ARTHROPLASTY Right 02/21/2016   SAVORY DILATION  03/24/2022   Procedure: SAVORY DILATION;  Surgeon: Marguerita Merles, Reuel Boom, MD;  Location: [facility];  Service: Gastroenterology;;   SHOULDER ARTHROSCOPY W/ ROTATOR CUFF REPAIR Right 1990s   TOTAL SHOULDER REPLACEMENT     Right   VAGINAL HYSTERECTOMY      Family History  Problem Relation Age of Onset   Congestive Heart Failure Mother    Atrial fibrillation Mother    Hiatal hernia Mother    Cancer Father        lung   Heart attack Brother    Stroke Paternal Grandfather    Diabetes Paternal Grandfather    Alzheimer's disease Paternal Grandmother    Social History   Tobacco Use   Smoking status: Former    Current packs/day: 0.00    Average packs/day: 3.0 packs/day for 10.0 years (30.0 ttl pk-yrs)    Types: Cigarettes    Start date: 05/08/1989    Quit date: 05/09/1999    Years since quitting: 23.9    Passive exposure: Current   Smokeless tobacco: Never   Tobacco comments:    "quit smoking in ~ 2003"  Vaping Use   Vaping status: Never Used  Substance Use Topics   Alcohol use: No    Comment: 02/21/2016 "I used to drink a little bit; probably quit in my 20s"   Drug use: No    Allergies  Allergen Reactions   Niacin And Related  Shortness Of Breath, Swelling and Other (See Comments)    Neck; skin hot   Codeine Swelling and Other (See Comments)    tongue   Zinc Swelling    Current Meds  Medication Sig   ibuprofen (ADVIL) 800 MG tablet Take 1 tablet (800 mg total) by mouth every 8 (eight) hours as needed.    Objective: BP 117/72   Pulse 72   Ht 5\' 7"  (1.702 m)   Wt 153 lb (69.4 kg)   BMI 23.96 kg/m   Physical Exam:  General: Alert and oriented. and No acute distress. Gait: Normal gait.  Ambulating well and flip-flops sandals.  She does have diffuse swelling over the ankle and dorsum of the foot.  No bruising.  Tenderness in the line with the peroneal tendons.  She has pain with inversion of the ankle.  She tolerates dorsi flexion beyond plantigrade position.  Smooth motion of the tibiotalar joint.  Mild tenderness over the anterior ankle.  Sensation intact distally.   IMAGING: I personally reviewed images previously obtained in clinic  Left ankle x-rays were previously obtained.  No acute injury.  Mild loss of tibiotalar joint space.  No obvious injury.   New Medications:  Meds ordered this encounter  Medications   ibuprofen (ADVIL) 800 MG tablet    Sig: Take 1 tablet (800 mg total) by mouth every 8 (eight) hours as needed.    Dispense:  90 tablet    Refill:  0      Oliver Barre, MD  04/28/2023 10:06 AM

## 2023-04-28 NOTE — Patient Instructions (Signed)

## 2023-05-05 DIAGNOSIS — M47817 Spondylosis without myelopathy or radiculopathy, lumbosacral region: Secondary | ICD-10-CM | POA: Diagnosis not present

## 2023-05-06 DIAGNOSIS — J3089 Other allergic rhinitis: Secondary | ICD-10-CM | POA: Diagnosis not present

## 2023-05-06 DIAGNOSIS — J301 Allergic rhinitis due to pollen: Secondary | ICD-10-CM | POA: Diagnosis not present

## 2023-05-06 DIAGNOSIS — J3081 Allergic rhinitis due to animal (cat) (dog) hair and dander: Secondary | ICD-10-CM | POA: Diagnosis not present

## 2023-05-12 DIAGNOSIS — J3089 Other allergic rhinitis: Secondary | ICD-10-CM | POA: Diagnosis not present

## 2023-05-12 DIAGNOSIS — J3081 Allergic rhinitis due to animal (cat) (dog) hair and dander: Secondary | ICD-10-CM | POA: Diagnosis not present

## 2023-05-12 DIAGNOSIS — J301 Allergic rhinitis due to pollen: Secondary | ICD-10-CM | POA: Diagnosis not present

## 2023-05-18 DIAGNOSIS — J3089 Other allergic rhinitis: Secondary | ICD-10-CM | POA: Diagnosis not present

## 2023-05-18 DIAGNOSIS — J301 Allergic rhinitis due to pollen: Secondary | ICD-10-CM | POA: Diagnosis not present

## 2023-05-19 ENCOUNTER — Other Ambulatory Visit (HOSPITAL_COMMUNITY): Payer: Self-pay | Admitting: Neurosurgery

## 2023-05-19 DIAGNOSIS — M47817 Spondylosis without myelopathy or radiculopathy, lumbosacral region: Secondary | ICD-10-CM

## 2023-05-25 DIAGNOSIS — J3089 Other allergic rhinitis: Secondary | ICD-10-CM | POA: Diagnosis not present

## 2023-05-25 DIAGNOSIS — J301 Allergic rhinitis due to pollen: Secondary | ICD-10-CM | POA: Diagnosis not present

## 2023-05-25 DIAGNOSIS — J3081 Allergic rhinitis due to animal (cat) (dog) hair and dander: Secondary | ICD-10-CM | POA: Diagnosis not present

## 2023-05-27 DIAGNOSIS — J3089 Other allergic rhinitis: Secondary | ICD-10-CM | POA: Diagnosis not present

## 2023-05-27 DIAGNOSIS — J301 Allergic rhinitis due to pollen: Secondary | ICD-10-CM | POA: Diagnosis not present

## 2023-06-01 ENCOUNTER — Ambulatory Visit (HOSPITAL_COMMUNITY)
Admission: RE | Admit: 2023-06-01 | Discharge: 2023-06-01 | Disposition: A | Discharge status: Home / Self Care | Payer: Medicare HMO | Source: Ambulatory Visit | Attending: Neurosurgery | Admitting: Neurosurgery

## 2023-06-01 DIAGNOSIS — M48061 Spinal stenosis, lumbar region without neurogenic claudication: Secondary | ICD-10-CM | POA: Diagnosis not present

## 2023-06-01 DIAGNOSIS — J3089 Other allergic rhinitis: Secondary | ICD-10-CM | POA: Diagnosis not present

## 2023-06-01 DIAGNOSIS — J3081 Allergic rhinitis due to animal (cat) (dog) hair and dander: Secondary | ICD-10-CM | POA: Diagnosis not present

## 2023-06-01 DIAGNOSIS — M47817 Spondylosis without myelopathy or radiculopathy, lumbosacral region: Secondary | ICD-10-CM | POA: Insufficient documentation

## 2023-06-01 DIAGNOSIS — J301 Allergic rhinitis due to pollen: Secondary | ICD-10-CM | POA: Diagnosis not present

## 2023-06-01 DIAGNOSIS — M47816 Spondylosis without myelopathy or radiculopathy, lumbar region: Secondary | ICD-10-CM | POA: Diagnosis not present

## 2023-06-01 DIAGNOSIS — M5135 Other intervertebral disc degeneration, thoracolumbar region: Secondary | ICD-10-CM | POA: Diagnosis not present

## 2023-06-01 DIAGNOSIS — M5136 Other intervertebral disc degeneration, lumbar region: Secondary | ICD-10-CM | POA: Diagnosis not present

## 2023-06-02 DIAGNOSIS — L718 Other rosacea: Secondary | ICD-10-CM | POA: Diagnosis not present

## 2023-06-10 DIAGNOSIS — J3089 Other allergic rhinitis: Secondary | ICD-10-CM | POA: Diagnosis not present

## 2023-06-10 DIAGNOSIS — J3081 Allergic rhinitis due to animal (cat) (dog) hair and dander: Secondary | ICD-10-CM | POA: Diagnosis not present

## 2023-06-10 DIAGNOSIS — J301 Allergic rhinitis due to pollen: Secondary | ICD-10-CM | POA: Diagnosis not present

## 2023-06-15 DIAGNOSIS — J301 Allergic rhinitis due to pollen: Secondary | ICD-10-CM | POA: Diagnosis not present

## 2023-06-15 DIAGNOSIS — J3081 Allergic rhinitis due to animal (cat) (dog) hair and dander: Secondary | ICD-10-CM | POA: Diagnosis not present

## 2023-06-15 DIAGNOSIS — J3089 Other allergic rhinitis: Secondary | ICD-10-CM | POA: Diagnosis not present

## 2023-06-21 DIAGNOSIS — M48061 Spinal stenosis, lumbar region without neurogenic claudication: Secondary | ICD-10-CM | POA: Diagnosis not present

## 2023-06-21 DIAGNOSIS — M47817 Spondylosis without myelopathy or radiculopathy, lumbosacral region: Secondary | ICD-10-CM | POA: Diagnosis not present

## 2023-06-21 DIAGNOSIS — M4126 Other idiopathic scoliosis, lumbar region: Secondary | ICD-10-CM | POA: Diagnosis not present

## 2023-06-22 DIAGNOSIS — J3081 Allergic rhinitis due to animal (cat) (dog) hair and dander: Secondary | ICD-10-CM | POA: Diagnosis not present

## 2023-06-22 DIAGNOSIS — J3089 Other allergic rhinitis: Secondary | ICD-10-CM | POA: Diagnosis not present

## 2023-06-22 DIAGNOSIS — J301 Allergic rhinitis due to pollen: Secondary | ICD-10-CM | POA: Diagnosis not present

## 2023-06-29 DIAGNOSIS — J301 Allergic rhinitis due to pollen: Secondary | ICD-10-CM | POA: Diagnosis not present

## 2023-06-29 DIAGNOSIS — J3081 Allergic rhinitis due to animal (cat) (dog) hair and dander: Secondary | ICD-10-CM | POA: Diagnosis not present

## 2023-06-29 DIAGNOSIS — J3089 Other allergic rhinitis: Secondary | ICD-10-CM | POA: Diagnosis not present

## 2023-07-02 DIAGNOSIS — M4126 Other idiopathic scoliosis, lumbar region: Secondary | ICD-10-CM | POA: Diagnosis not present

## 2023-07-02 DIAGNOSIS — M48061 Spinal stenosis, lumbar region without neurogenic claudication: Secondary | ICD-10-CM | POA: Diagnosis not present

## 2023-07-02 DIAGNOSIS — M4722 Other spondylosis with radiculopathy, cervical region: Secondary | ICD-10-CM | POA: Diagnosis not present

## 2023-07-02 DIAGNOSIS — M542 Cervicalgia: Secondary | ICD-10-CM | POA: Diagnosis not present

## 2023-07-06 ENCOUNTER — Other Ambulatory Visit (HOSPITAL_COMMUNITY): Payer: Self-pay | Admitting: Neurological Surgery

## 2023-07-06 DIAGNOSIS — M4722 Other spondylosis with radiculopathy, cervical region: Secondary | ICD-10-CM

## 2023-07-07 DIAGNOSIS — J3081 Allergic rhinitis due to animal (cat) (dog) hair and dander: Secondary | ICD-10-CM | POA: Diagnosis not present

## 2023-07-07 DIAGNOSIS — J301 Allergic rhinitis due to pollen: Secondary | ICD-10-CM | POA: Diagnosis not present

## 2023-07-07 DIAGNOSIS — J3089 Other allergic rhinitis: Secondary | ICD-10-CM | POA: Diagnosis not present

## 2023-07-14 ENCOUNTER — Ambulatory Visit (HOSPITAL_COMMUNITY)
Admission: RE | Admit: 2023-07-14 | Discharge: 2023-07-14 | Disposition: A | Discharge status: Home / Self Care | Payer: Medicare HMO | Source: Ambulatory Visit | Attending: Neurological Surgery | Admitting: Neurological Surgery

## 2023-07-14 DIAGNOSIS — J3081 Allergic rhinitis due to animal (cat) (dog) hair and dander: Secondary | ICD-10-CM | POA: Diagnosis not present

## 2023-07-14 DIAGNOSIS — J3089 Other allergic rhinitis: Secondary | ICD-10-CM | POA: Diagnosis not present

## 2023-07-14 DIAGNOSIS — M4722 Other spondylosis with radiculopathy, cervical region: Secondary | ICD-10-CM | POA: Diagnosis not present

## 2023-07-14 DIAGNOSIS — J301 Allergic rhinitis due to pollen: Secondary | ICD-10-CM | POA: Diagnosis not present

## 2023-07-14 DIAGNOSIS — M5011 Cervical disc disorder with radiculopathy,  high cervical region: Secondary | ICD-10-CM | POA: Diagnosis not present

## 2023-07-14 DIAGNOSIS — M4802 Spinal stenosis, cervical region: Secondary | ICD-10-CM | POA: Diagnosis not present

## 2023-07-14 DIAGNOSIS — M419 Scoliosis, unspecified: Secondary | ICD-10-CM | POA: Diagnosis not present

## 2023-07-21 DIAGNOSIS — L820 Inflamed seborrheic keratosis: Secondary | ICD-10-CM | POA: Diagnosis not present

## 2023-07-21 DIAGNOSIS — J301 Allergic rhinitis due to pollen: Secondary | ICD-10-CM | POA: Diagnosis not present

## 2023-07-21 DIAGNOSIS — L538 Other specified erythematous conditions: Secondary | ICD-10-CM | POA: Diagnosis not present

## 2023-07-21 DIAGNOSIS — Z7189 Other specified counseling: Secondary | ICD-10-CM | POA: Diagnosis not present

## 2023-07-21 DIAGNOSIS — J3081 Allergic rhinitis due to animal (cat) (dog) hair and dander: Secondary | ICD-10-CM | POA: Diagnosis not present

## 2023-07-21 DIAGNOSIS — R208 Other disturbances of skin sensation: Secondary | ICD-10-CM | POA: Diagnosis not present

## 2023-07-21 DIAGNOSIS — J3089 Other allergic rhinitis: Secondary | ICD-10-CM | POA: Diagnosis not present

## 2023-07-21 DIAGNOSIS — L814 Other melanin hyperpigmentation: Secondary | ICD-10-CM | POA: Diagnosis not present

## 2023-07-21 DIAGNOSIS — L821 Other seborrheic keratosis: Secondary | ICD-10-CM | POA: Diagnosis not present

## 2023-07-21 DIAGNOSIS — D225 Melanocytic nevi of trunk: Secondary | ICD-10-CM | POA: Diagnosis not present

## 2023-07-21 DIAGNOSIS — L718 Other rosacea: Secondary | ICD-10-CM | POA: Diagnosis not present

## 2023-07-21 DIAGNOSIS — I788 Other diseases of capillaries: Secondary | ICD-10-CM | POA: Diagnosis not present

## 2023-07-28 ENCOUNTER — Telehealth: Payer: Self-pay | Admitting: *Deleted

## 2023-07-28 ENCOUNTER — Other Ambulatory Visit (INDEPENDENT_AMBULATORY_CARE_PROVIDER_SITE_OTHER): Payer: Self-pay | Admitting: Gastroenterology

## 2023-07-28 DIAGNOSIS — J301 Allergic rhinitis due to pollen: Secondary | ICD-10-CM | POA: Diagnosis not present

## 2023-07-28 DIAGNOSIS — J3089 Other allergic rhinitis: Secondary | ICD-10-CM | POA: Diagnosis not present

## 2023-07-28 DIAGNOSIS — J3081 Allergic rhinitis due to animal (cat) (dog) hair and dander: Secondary | ICD-10-CM | POA: Diagnosis not present

## 2023-07-28 MED ORDER — ESOMEPRAZOLE MAGNESIUM 40 MG PO CPDR: DELAYED_RELEASE_CAPSULE | ORAL | 0 refills | Status: DC

## 2023-07-28 NOTE — Telephone Encounter (Signed)
Pt called because pharmacy said her acid pill was denied. Last ov note in April said to switch nexium to omeprazole. Pt said she never took that because it did not help in past and she has still been taking esomeprazole. She states she still has some problems with reflux. Last ov in April and she was suppose to follow up in 3 months. I let her know she needs OV. Mitzie please schedule and chelsea she wanted to see if she could still get refill on esomeprazole so she will have something to take til she can get in office to discuss alternative.   [phone]

## 2023-07-28 NOTE — Telephone Encounter (Signed)
I called and left a detailed message regarding the esomeprazole being sent in to the requested pharmacy.

## 2023-07-28 NOTE — Telephone Encounter (Signed)
Nexium d/c started on omeprazole.

## 2023-08-04 DIAGNOSIS — J3089 Other allergic rhinitis: Secondary | ICD-10-CM | POA: Diagnosis not present

## 2023-08-04 DIAGNOSIS — J301 Allergic rhinitis due to pollen: Secondary | ICD-10-CM | POA: Diagnosis not present

## 2023-08-04 DIAGNOSIS — J3081 Allergic rhinitis due to animal (cat) (dog) hair and dander: Secondary | ICD-10-CM | POA: Diagnosis not present

## 2023-08-11 DIAGNOSIS — J3089 Other allergic rhinitis: Secondary | ICD-10-CM | POA: Diagnosis not present

## 2023-08-11 DIAGNOSIS — J3081 Allergic rhinitis due to animal (cat) (dog) hair and dander: Secondary | ICD-10-CM | POA: Diagnosis not present

## 2023-08-11 DIAGNOSIS — J301 Allergic rhinitis due to pollen: Secondary | ICD-10-CM | POA: Diagnosis not present

## 2023-08-18 DIAGNOSIS — J301 Allergic rhinitis due to pollen: Secondary | ICD-10-CM | POA: Diagnosis not present

## 2023-08-18 DIAGNOSIS — H52223 Regular astigmatism, bilateral: Secondary | ICD-10-CM | POA: Diagnosis not present

## 2023-08-18 DIAGNOSIS — H524 Presbyopia: Secondary | ICD-10-CM | POA: Diagnosis not present

## 2023-08-18 DIAGNOSIS — M4126 Other idiopathic scoliosis, lumbar region: Secondary | ICD-10-CM | POA: Diagnosis not present

## 2023-08-18 DIAGNOSIS — J3081 Allergic rhinitis due to animal (cat) (dog) hair and dander: Secondary | ICD-10-CM | POA: Diagnosis not present

## 2023-08-18 DIAGNOSIS — M542 Cervicalgia: Secondary | ICD-10-CM | POA: Diagnosis not present

## 2023-08-18 DIAGNOSIS — M48061 Spinal stenosis, lumbar region without neurogenic claudication: Secondary | ICD-10-CM | POA: Diagnosis not present

## 2023-08-18 DIAGNOSIS — J3089 Other allergic rhinitis: Secondary | ICD-10-CM | POA: Diagnosis not present

## 2023-08-19 ENCOUNTER — Ambulatory Visit: Payer: PPO | Admitting: Neurology

## 2023-08-24 ENCOUNTER — Ambulatory Visit (INDEPENDENT_AMBULATORY_CARE_PROVIDER_SITE_OTHER): Payer: Medicare HMO | Admitting: Gastroenterology

## 2023-08-24 VITALS — BP 103/62 | HR 74 | Temp 98.0°F | Ht 67.0 in | Wt 158.2 lb

## 2023-08-24 DIAGNOSIS — K219 Gastro-esophageal reflux disease without esophagitis: Secondary | ICD-10-CM | POA: Diagnosis not present

## 2023-08-24 DIAGNOSIS — R1319 Other dysphagia: Secondary | ICD-10-CM

## 2023-08-24 DIAGNOSIS — R110 Nausea: Secondary | ICD-10-CM | POA: Diagnosis not present

## 2023-08-24 MED ORDER — SUCRALFATE 1 G PO TABS: 1.0000 g | ORAL_TABLET | Freq: Three times a day (TID) | ORAL | 1 refills | Status: DC

## 2023-08-24 MED ORDER — ONDANSETRON HCL 4 MG PO TABS
4.0000 mg | ORAL_TABLET | Freq: Three times a day (TID) | ORAL | 1 refills | Status: AC | PRN
Start: 2023-08-24 — End: ?

## 2023-08-24 NOTE — Patient Instructions (Addendum)
Continue with Nexium 40mg  daily  Carafate 1g before meals and at bedtime, can dissolve tablet in 1/2 oz of water and drink I have sent zofran 4mg  to take up to every 8 hours as needed for nausea  Please let me know if you wish to proceed with gastric emptying study  Follow up 3 months  It was a pleasure to see you today. I want to create trusting relationships with patients and provide genuine, compassionate, and quality care. I truly value your feedback! please be on the lookout for a survey regarding your visit with me today. I appreciate your input about our visit and your time in completing this!    Aashvi Rezabek L. Jeanmarie Hubert, MSN, APRN, AGNP-C Adult-Gerontology Nurse Practitioner [facility]

## 2023-08-24 NOTE — Progress Notes (Signed)
Referring Provider: Assunta Found, MD Primary Care Physician:  Assunta Found, MD Primary GI Physician: Dr. Levon Hedger   Chief Complaint  Patient presents with   Follow-up    Pt arrives for follow up. Pt still getting sick on stomach at night. Pt unable to vomit since having surgery. Does have some burning in upper abdomen.    HPI:   Kathryn [name] is a 67 y.o. female with past medical history of anxiety, asthma, GERD, Guillian Barre syndrome, IBS  Patient presenting today for follow up of GERD, dysphagia and nausea   Last seen April 2024, at that time having a BM almost daily with harder stool balls. Having nausea at night which is intermittent. Insruance would not cover zofran. No vomiting. Insurance would nto cover nexium though she was using it and having pretty good results. Dysphagia about the same, more with foods vs liquids.   Recommended to consider GES, chewing precautions   Present:  She states that she is not having much acid regurgitation but has a lot of burning in her stomach. Sometimes drinking water will help with the burning. She has a lot of nausea but cannot vomit. Sometimes does not have burning pain prior to her nausea. She is taking nexium 40mg  daily. She notes that she belches at times and has regurgitation. She continues to have some issues with dysphagia, can be with almost anything. She is taking ibuprofen 800mg  daily for chronic back and neck pain. She has been doing this for the past few months. Does not feel that GI symptoms have worsened since taking ibuprofen. She is also on doxycycline for her rosacea, but only on this for about 1 month.  Omeprazole did not provide results in the past. Notes nausea is worse in the evenings, noting this occurs a few hours after she eats. She does eat a heavier meal in the evenings. Notably had CT of  her head in October 2023 that was normal.   Denies rectal bleeding or melena. Appetite is good. Denies weight loss or early  satiety.   DG esophagus: 2020 age related esophageal dysmotility  Last EGD: 03/24/2022 which showed a 1 cm hiatal hernia and presence of a prior Nissen fundoplication with a loose wrap, no other abnormalities were found in the esophageal lumen.  The esophagus was empirically dilated with an 18 mm Savary dilator. Biopsies mid and distal esophagus were unremarkable  Last Colonoscopy: 2016 Prep was excellent except she had thick layer of stool coating cecal and ascending colon mucosa. Cecal landmarks were well identified after vigorous washing. Small polyp ablated via cold biopsy from appendiceal stump. Mucosa of rest of the colon was normal. Normal mucosa of rectum and anorectal junction   Past Medical History:  Diagnosis Date   Adrenal insufficiency (HCC)    occured following steroid shoulder injections ~ spring 2017   Anxiety    pt. reports that she has RLS- takes Xanax for calming her self so she can settle & sleep    Arthritis    shoulder & back, knees & leg     Asthma    aleery related   Atypical mole 11/12/2020   mod-severe- mid back   Chest pain, atypical    had cardiac workup - deemed related to anxiety   Chronic bronchitis (HCC)    "less since I quit smoking" (02/21/2016)   Chronic lower back pain    Dyspnea    related to spray paint that is put in  beer cans at  her place of employment.  Pt. also reports that she has anxiety also that leads to SOB., Pt. reports her Mother just died a week ago     GERD (gastroesophageal reflux disease)    Guillain-Barre syndrome following vaccination (HCC) 1970's   post swine flu shot, hosp. for treatment for 1 month    H/O dizziness    H/O hiatal hernia    Hepatitis    hepatitis as a child; "got it from a little girl at school" make sick and vomiting   Herpes simplex    History of Guillain-Barre syndrome ~ 1978   "from the swine flu shot"   IBS (irritable bowel syndrome)    Pneumonia    PONV (postoperative nausea and vomiting)     Restless leg syndrome    Walking pneumonia ~ 2011    Past Surgical History:  Procedure Laterality Date   43 HOUR PH STUDY N/A 09/23/2016   Procedure: 24 HOUR PH STUDY;  Surgeon: Napoleon Form, MD;  Location: [facility];  Service: Endoscopy;  Laterality: N/A;   ANTERIOR CERVICAL DECOMP/DISCECTOMY FUSION  2000 & 2015   two times ago- C5 and C6, Dr. Clydene Fake, MD 2015   APPENDECTOMY  1980s   BACK SURGERY     BIOPSY  03/24/2022   Procedure: BIOPSY;  Surgeon: Dolores Frame, MD;  Location: [facility];  Service: Gastroenterology;;   BREAST BIOPSY Right    benign   CHOLECYSTECTOMY OPEN  1980s   COLONOSCOPY N/A 09/20/2014   rehman: Prep was excellent except she had thick layer of stool coating cecal and ascending colon mucosa. Cecal landmarks were well identified after vigorous washing. Small lymphoid polyp ablated via cold biopsy from appendiceal stump.Mucosa of rest of the colon was normal. Normal mucosa of rectum and anorectal junction.   ESOPHAGEAL DILATION N/A 05/29/2019   Procedure: ESOPHAGEAL DILATION;  Surgeon: Malissa Hippo, MD;  Location: [facility];  Service: Endoscopy;  Laterality: N/A;   ESOPHAGEAL MANOMETRY N/A 09/23/2016   Procedure: ESOPHAGEAL MANOMETRY (EM);  Surgeon: Napoleon Form, MD;  Location: [facility];  Service: Endoscopy;  Laterality: N/A;   ESOPHAGOGASTRODUODENOSCOPY N/A 09/20/2014   Procedure: ESOPHAGOGASTRODUODENOSCOPY (EGD);  Surgeon: Malissa Hippo, MD;  [facility]: [facility];  Service: Endoscopy;  Laterality: N/A;   ESOPHAGOGASTRODUODENOSCOPY (EGD) WITH PROPOFOL N/A 05/29/2019   - Z-line, 38 cm from the incisors.   ESOPHAGOGASTRODUODENOSCOPY (EGD) WITH PROPOFOL N/A 03/24/2022   Procedure: ESOPHAGOGASTRODUODENOSCOPY (EGD) WITH PROPOFOL;  Surgeon: Dolores Frame, MD;  [facility];  Service: Gastroenterology;  Laterality: N/A;  730 ASA 1   HIATAL HERNIA REPAIR N/A 11/27/2016   Procedure: LAPAROSCOPIC  REPAIR OF HIATAL HERNIA WITH NISSEN FUNDOPLICATION;  Surgeon: Avel Peace, MD;  Location: [facility];  Service: General;  Laterality: N/A;   INCISION AND DRAINAGE OF WOUND Left 02/21/2016   forearm, minor   IR ANGIO INTRA EXTRACRAN SEL INTERNAL CAROTID BILAT MOD SED  11/03/2022   IR ANGIO VERTEBRAL SEL VERTEBRAL UNI L MOD SED  11/03/2022   LUMBAR LAMINECTOMY/DECOMPRESSION MICRODISCECTOMY Right 06/22/2013   Procedure: RIGHT LUMBAR TWO-THREE DISCECTOMY;  Surgeon: Clydene Fake, MD;  Location: [facility];  Service: Neurosurgery;  Laterality: Right;  Right    MALONEY DILATION N/A 09/20/2014   Procedure: Elease Hashimoto DILATION;  Surgeon: Malissa Hippo, MD;  [facility];  Service: Endoscopy;  Laterality: N/A;   MINOR IRRIGATION AND DEBRIDEMENT OF WOUND Left 02/21/2016   Procedure: MINOR IRRIGATION AND DEBRIDEMENT/REPAIR  OF LEFT ARM WOUND;  Surgeon: Beverely Low, MD;  [facility];  Service: Orthopedics;  Laterality: Left;   REVERSE SHOULDER ARTHROPLASTY Right 02/21/2016   Procedure: RIGHT REVERSE TOTAL SHOULDER ARTHROPLASTY;  Surgeon: Beverely Low, MD;  Location: [facility] OR;  Service: Orthopedics;  Laterality: Right;   REVERSE TOTAL SHOULDER ARTHROPLASTY Right 02/21/2016   SAVORY DILATION  03/24/2022   Procedure: SAVORY DILATION;  Surgeon: Marguerita Merles, Reuel Boom, MD;  [facility]: [facility];  Service: Gastroenterology;;   SHOULDER ARTHROSCOPY W/ ROTATOR CUFF REPAIR Right 1990s   TOTAL SHOULDER REPLACEMENT     Right   VAGINAL HYSTERECTOMY      Current Outpatient Medications  Medication Sig Dispense Refill   acetaminophen (TYLENOL) 500 MG tablet Take 500-1,000 mg by mouth every 6 (six) hours as needed (For pain.).     albuterol (PROVENTIL HFA;VENTOLIN HFA) 108 (90 Base) MCG/ACT inhaler Inhale 2 puffs into the lungs every 4 (four) hours as needed for wheezing or shortness of breath.     ALPRAZolam (XANAX) 0.5 MG tablet Take 0.5 mg by mouth daily as needed for anxiety.     Biotin 21308  MCG TABS Take 10,000 mcg by mouth daily.      Cholecalciferol (VITAMIN D-3) 125 MCG (5000 UT) TABS Take 5,000 Units by mouth daily.      cyanocobalamin (VITAMIN B12) 1000 MCG tablet Take 1 tablet (1,000 mcg total) by mouth daily. 30 tablet 2   EPINEPHrine 0.3 mg/0.3 mL IJ SOAJ injection Inject 0.3 mg into the skin as needed for anaphylaxis.     escitalopram (LEXAPRO) 20 MG tablet Take 20 mg by mouth daily.     esomeprazole (NEXIUM) 40 MG capsule TAKE 1 CAPSULE(40 MG) BY MOUTH TWICE DAILY BEFORE A MEAL 90 capsule 0   fluticasone (FLONASE) 50 MCG/ACT nasal spray Place 2 sprays into both nostrils daily as needed for allergies or rhinitis.     GABAPENTIN PO Take by mouth. Patient unsure of dose but takes up to three times per day for restless legs     ibuprofen (ADVIL) 800 MG tablet Take 1 tablet (800 mg total) by mouth every 8 (eight) hours as needed. 90 tablet 0   levothyroxine (SYNTHROID) 25 MCG tablet Take 25 mcg by mouth daily before breakfast.     loratadine (CLARITIN) 10 MG tablet Take 10 mg by mouth daily.     montelukast (SINGULAIR) 10 MG tablet Take 10 mg by mouth every morning.      rOPINIRole (REQUIP) 4 MG tablet Take 6 mg by mouth daily. 4 - 6 mg as needed     rosuvastatin (CRESTOR) 5 MG tablet Take 1 tablet (5 mg total) by mouth at bedtime. 90 tablet 3   traMADol (ULTRAM) 50 MG tablet Take 50-100 mg by mouth every 12 (twelve) hours as needed for severe pain (pain score 7-10).     valACYclovir (VALTREX) 1000 MG tablet Take 1,000 mg by mouth daily.     No current facility-administered medications for this visit.    Allergies as of 08/24/2023 - Review Complete 08/24/2023  Allergen Reaction Noted   Niacin and related Shortness Of Breath, Swelling, and Other (See Comments) 06/14/2013   Codeine Swelling and Other (See Comments) 06/14/2013   Zinc Swelling 02/07/2016    Family History  Problem Relation Age of Onset   Congestive Heart Failure Mother    Atrial fibrillation Mother     Hiatal hernia Mother    Cancer Father        lung  Heart attack Brother    Stroke Paternal Grandfather    Diabetes Paternal Grandfather    Alzheimer's disease Paternal Grandmother     Social History   Socioeconomic History   Marital status: Married    Spouse name: Not on file   Number of children: Not on file   Years of education: Not on file   Highest education level: Not on file  Occupational History   Occupation: Production manager  Tobacco Use   Smoking status: Former    Current packs/day: 0.00    Average packs/day: 3.0 packs/day for 10.0 years (30.0 ttl pk-yrs)    Types: Cigarettes    Start date: 05/08/1989    Quit date: 05/09/1999    Years since quitting: 24.3    Passive exposure: Current   Smokeless tobacco: Never   Tobacco comments:    "quit smoking in ~ 2003"  Vaping Use   Vaping status: Never Used  Substance and Sexual Activity   Alcohol use: No    Comment: 02/21/2016 "I used to drink a little bit; probably quit in my 20s"   Drug use: No   Sexual activity: Yes    Birth control/protection: Surgical    Comment: hyst  Other Topics Concern   Not on file  Social History Narrative   Walks but no other activity    Anxiety   Right handed   Caffeine-64oz daily   Social Drivers of Health   Financial Resource Strain: Low Risk  (07/06/2023)   Received from [facility]   Overall Financial Resource Strain (CARDIA)    Difficulty of Paying Living Expenses: Not hard at all  Food Insecurity: No Food Insecurity (07/06/2023)   [facility]   Hunger Vital Sign    Worried About Running Out of Food in the Last Year: Never true    Ran Out of Food in the Last Year: Never true  Transportation Needs: No Transportation Needs (07/06/2023)   Received from [facility] - Transportation    Lack of Transportation (Medical): No    Lack of Transportation (Non-Medical): No  Physical Activity: Not on file  Stress: Not on file  Social Connections: Unknown  (06/17/2023)   [facility] from [facility]   Social Network    Social Network: Not on file    Review of systems General: negative for malaise, night sweats, fever, chills, weight loss Neck: Negative for lumps, goiter, pain and significant neck swelling Resp: Negative for cough, wheezing, dyspnea at rest CV: Negative for chest pain, leg swelling, palpitations, orthopnea GI: denies melena, hematochezia, vomiting, diarrhea, constipation, odyonophagia, early satiety or unintentional weight loss. +nausea +burning in stomach +dysphagia  MSK: Negative for joint pain or swelling, back pain, and muscle pain. Derm: Negative for itching or rash Psych: Denies depression, anxiety, memory loss, confusion. No homicidal or suicidal ideation.  Heme: Negative for prolonged bleeding, bruising easily, and swollen nodes. Endocrine: Negative for cold or heat intolerance, polyuria, polydipsia and goiter. Neuro: negative for tremor, gait imbalance, syncope and seizures. The remainder of the review of systems is noncontributory.  Physical Exam: BP 103/62   Pulse 74   Temp 98 F (36.7 C)   Ht 5\' 7"  (1.702 m)   Wt 158 lb 3.2 oz (71.8 kg)   BMI 24.78 kg/m  General:   Alert and oriented. No distress noted. Pleasant and cooperative.  Head:  Normocephalic and atraumatic. Eyes:  Conjuctiva clear without scleral icterus. Mouth:  Oral mucosa pink and moist. Good dentition.  No lesions. Heart: Normal rate and rhythm, s1 and s2 heart sounds present.  Lungs: Clear lung sounds in all lobes. Respirations equal and unlabored. Abdomen:  +BS, soft, non-tender and non-distended. No rebound or guarding. No HSM or masses noted. Derm: No palmar erythema or jaundice Msk:  Symmetrical without gross deformities. Normal posture. Extremities:  Without edema. Neurologic:  Alert and  oriented x4 Psych:  Alert and cooperative. Normal mood and affect.  Invalid input(s): "6 MONTHS"   ASSESSMENT: Kathryn [name] is a 68 y.o.  female presenting today for follow up of GERD, dysphagia, nausea  Patient with history of GERD, currently maintained on Nexium 40 mg daily.  Endorses a lot of burning in her stomach but no heartburn or acid regurgitation.  Also with continued nausea.  Reassuringly recent head CT in the end of 2023 was negative.  She is unable to vomit due to previous Nissen fundoplication.  Given her ongoing nausea, would recommend proceeding with gastric emptying study for further evaluation to rule out gastroparesis.  At this time she is unsure if she wished to proceed with this but will think about it and let me know.  For now we will continue with Nexium, add Carafate 1 g before meals and at bedtime and Zofran 4 mg up to every 8 hours as needed.  Dysphagia has been a persistent issue for her, EGD in July 2023 with no esophageal abnormalities, esophageal was empirically dilated though she did not feel she had much improvement with this.  Esophagram in 2020 with age-related dysmotility.  She has also had esophageal manometry in 2018 with no abnormalities found at that time.  Etiology of her dysphagia is unclear.  She does have a history of Katheran Awe syndrome which could have potentially led to some ongoing swallowing issues for her.  May need to consider evaluation with SLP if her dysphagia persist.  PLAN:  Continue with Nexium 40mg  daily  2. Carafate 1g QID  3. GES- pt to let me know if she wishes to proceed with this  4. Zofran 4mg  q8H prn   All questions were answered, patient verbalized understanding and is in agreement with plan as outlined above.   Follow Up: 3 months   Iriel Nason L. Jeanmarie Hubert, MSN, APRN, AGNP-C Adult-Gerontology Nurse Practitioner [facility]

## 2023-08-25 DIAGNOSIS — J301 Allergic rhinitis due to pollen: Secondary | ICD-10-CM | POA: Diagnosis not present

## 2023-08-25 DIAGNOSIS — J3081 Allergic rhinitis due to animal (cat) (dog) hair and dander: Secondary | ICD-10-CM | POA: Diagnosis not present

## 2023-08-25 DIAGNOSIS — J3089 Other allergic rhinitis: Secondary | ICD-10-CM | POA: Diagnosis not present

## 2023-09-03 DIAGNOSIS — J301 Allergic rhinitis due to pollen: Secondary | ICD-10-CM | POA: Diagnosis not present

## 2023-09-03 DIAGNOSIS — J3089 Other allergic rhinitis: Secondary | ICD-10-CM | POA: Diagnosis not present

## 2023-09-03 DIAGNOSIS — J3081 Allergic rhinitis due to animal (cat) (dog) hair and dander: Secondary | ICD-10-CM | POA: Diagnosis not present

## 2023-09-10 DIAGNOSIS — J3089 Other allergic rhinitis: Secondary | ICD-10-CM | POA: Diagnosis not present

## 2023-09-10 DIAGNOSIS — J301 Allergic rhinitis due to pollen: Secondary | ICD-10-CM | POA: Diagnosis not present

## 2023-09-16 ENCOUNTER — Other Ambulatory Visit (INDEPENDENT_AMBULATORY_CARE_PROVIDER_SITE_OTHER): Payer: Self-pay | Admitting: Gastroenterology

## 2023-09-17 DIAGNOSIS — J3081 Allergic rhinitis due to animal (cat) (dog) hair and dander: Secondary | ICD-10-CM | POA: Diagnosis not present

## 2023-09-17 DIAGNOSIS — J301 Allergic rhinitis due to pollen: Secondary | ICD-10-CM | POA: Diagnosis not present

## 2023-09-17 DIAGNOSIS — J3089 Other allergic rhinitis: Secondary | ICD-10-CM | POA: Diagnosis not present

## 2023-09-24 DIAGNOSIS — J301 Allergic rhinitis due to pollen: Secondary | ICD-10-CM | POA: Diagnosis not present

## 2023-09-24 DIAGNOSIS — J3089 Other allergic rhinitis: Secondary | ICD-10-CM | POA: Diagnosis not present

## 2023-09-29 DIAGNOSIS — J301 Allergic rhinitis due to pollen: Secondary | ICD-10-CM | POA: Diagnosis not present

## 2023-09-29 DIAGNOSIS — J3081 Allergic rhinitis due to animal (cat) (dog) hair and dander: Secondary | ICD-10-CM | POA: Diagnosis not present

## 2023-09-29 DIAGNOSIS — J3089 Other allergic rhinitis: Secondary | ICD-10-CM | POA: Diagnosis not present

## 2023-10-08 DIAGNOSIS — J3081 Allergic rhinitis due to animal (cat) (dog) hair and dander: Secondary | ICD-10-CM | POA: Diagnosis not present

## 2023-10-08 DIAGNOSIS — J301 Allergic rhinitis due to pollen: Secondary | ICD-10-CM | POA: Diagnosis not present

## 2023-10-08 DIAGNOSIS — J3089 Other allergic rhinitis: Secondary | ICD-10-CM | POA: Diagnosis not present

## 2023-10-14 DIAGNOSIS — J3081 Allergic rhinitis due to animal (cat) (dog) hair and dander: Secondary | ICD-10-CM | POA: Diagnosis not present

## 2023-10-14 DIAGNOSIS — J301 Allergic rhinitis due to pollen: Secondary | ICD-10-CM | POA: Diagnosis not present

## 2023-10-14 DIAGNOSIS — J3089 Other allergic rhinitis: Secondary | ICD-10-CM | POA: Diagnosis not present

## 2023-10-15 DIAGNOSIS — M48061 Spinal stenosis, lumbar region without neurogenic claudication: Secondary | ICD-10-CM | POA: Diagnosis not present

## 2023-10-21 DIAGNOSIS — J301 Allergic rhinitis due to pollen: Secondary | ICD-10-CM | POA: Diagnosis not present

## 2023-10-21 DIAGNOSIS — J3089 Other allergic rhinitis: Secondary | ICD-10-CM | POA: Diagnosis not present

## 2023-10-21 DIAGNOSIS — J3081 Allergic rhinitis due to animal (cat) (dog) hair and dander: Secondary | ICD-10-CM | POA: Diagnosis not present

## 2023-10-22 DIAGNOSIS — M4126 Other idiopathic scoliosis, lumbar region: Secondary | ICD-10-CM | POA: Diagnosis not present

## 2023-10-28 ENCOUNTER — Other Ambulatory Visit (INDEPENDENT_AMBULATORY_CARE_PROVIDER_SITE_OTHER): Payer: Self-pay | Admitting: Gastroenterology

## 2023-10-28 DIAGNOSIS — J301 Allergic rhinitis due to pollen: Secondary | ICD-10-CM | POA: Diagnosis not present

## 2023-10-28 DIAGNOSIS — J3089 Other allergic rhinitis: Secondary | ICD-10-CM | POA: Diagnosis not present

## 2023-10-29 ENCOUNTER — Other Ambulatory Visit (INDEPENDENT_AMBULATORY_CARE_PROVIDER_SITE_OTHER): Payer: Self-pay | Admitting: *Deleted

## 2023-10-29 MED ORDER — ESOMEPRAZOLE MAGNESIUM 40 MG PO CPDR
DELAYED_RELEASE_CAPSULE | ORAL | 0 refills | Status: DC
Start: 2023-10-29 — End: 2024-01-13

## 2023-10-29 NOTE — Telephone Encounter (Signed)
 Last seen 08/24/23

## 2023-11-03 ENCOUNTER — Other Ambulatory Visit: Payer: Self-pay

## 2023-11-03 ENCOUNTER — Ambulatory Visit (HOSPITAL_COMMUNITY): Payer: PPO | Attending: Neurosurgery

## 2023-11-03 DIAGNOSIS — M419 Scoliosis, unspecified: Secondary | ICD-10-CM | POA: Diagnosis not present

## 2023-11-03 DIAGNOSIS — R262 Difficulty in walking, not elsewhere classified: Secondary | ICD-10-CM | POA: Diagnosis not present

## 2023-11-03 DIAGNOSIS — M5416 Radiculopathy, lumbar region: Secondary | ICD-10-CM | POA: Diagnosis not present

## 2023-11-03 NOTE — Therapy (Signed)
 OUTPATIENT PHYSICAL THERAPY THORACOLUMBAR EVALUATION   Patient Name: Kathryn [name] MRN: [id number] DOB:28-Jan-1956, 67 y.o., female Today's Date: 11/03/2023  END OF SESSION:  PT End of Session - 11/03/23 0851     Visit Number 1    Number of Visits 8    Date for PT Re-Evaluation 12/01/23    Authorization Type [employer] (no auth)    PT Start Time 0720    PT Stop Time 0800    PT Time Calculation (min) 40 min    Activity Tolerance Patient tolerated treatment well    Behavior During Therapy [facility] for tasks assessed/performed             Past Medical History:  Diagnosis Date   Adrenal insufficiency (HCC)    occured following steroid shoulder injections ~ spring 2017   Anxiety    pt. reports that she has RLS- takes Xanax for calming her self so she can settle & sleep    Arthritis    shoulder & back, knees & leg     Asthma    aleery related   Atypical mole 11/12/2020   mod-severe- mid back   Chest pain, atypical    had cardiac workup - deemed related to anxiety   Chronic bronchitis (HCC)    "less since I quit smoking" (02/21/2016)   Chronic lower back pain    Dyspnea    related to spray paint that is put in  beer cans at her place of employment.  Pt. also reports that she has anxiety also that leads to SOB., Pt. reports her Mother just died a week ago     GERD (gastroesophageal reflux disease)    Guillain-Barre syndrome following vaccination (HCC) 1970's   post swine flu shot, hosp. for treatment for 1 month    H/O dizziness    H/O hiatal hernia    Hepatitis    hepatitis as a child; "got it from a little girl at school" make sick and vomiting   Herpes simplex    History of Guillain-Barre syndrome ~ 1978   "from the swine flu shot"   IBS (irritable bowel syndrome)    Pneumonia    PONV (postoperative nausea and vomiting)    Restless leg syndrome    Walking pneumonia ~ 2011   Past Surgical History:  Procedure Laterality Date   44 HOUR PH STUDY N/A  09/23/2016   Procedure: 24 HOUR PH STUDY;  Surgeon: Napoleon Form, MD;  Location: [facility];  Service: Endoscopy;  Laterality: N/A;   ANTERIOR CERVICAL DECOMP/DISCECTOMY FUSION  2000 & 2015   two times ago- C5 and C6, Dr. Clydene Fake, MD 2015   APPENDECTOMY  1980s   BACK SURGERY     BIOPSY  03/24/2022   Procedure: BIOPSY;  Surgeon: Dolores Frame, MD;  Location: [facility];  Service: Gastroenterology;;   BREAST BIOPSY Right    benign   CHOLECYSTECTOMY OPEN  1980s   COLONOSCOPY N/A 09/20/2014   rehman: Prep was excellent except she had thick layer of stool coating cecal and ascending colon mucosa. Cecal landmarks were well identified after vigorous washing. Small lymphoid polyp ablated via cold biopsy from appendiceal stump.Mucosa of rest of the colon was normal. Normal mucosa of rectum and anorectal junction.   ESOPHAGEAL DILATION N/A 05/29/2019   Procedure: ESOPHAGEAL DILATION;  Surgeon: Malissa Hippo, MD;  [facility];  Service: Endoscopy;  Laterality: N/A;   ESOPHAGEAL MANOMETRY N/A 09/23/2016   Procedure:  ESOPHAGEAL MANOMETRY (EM);  Surgeon: Napoleon Form, MD;  Location: [facility];  Service: Endoscopy;  Laterality: N/A;   ESOPHAGOGASTRODUODENOSCOPY N/A 09/20/2014   Procedure: ESOPHAGOGASTRODUODENOSCOPY (EGD);  Surgeon: Malissa Hippo, MD;  [facility];  Service: Endoscopy;  Laterality: N/A;   ESOPHAGOGASTRODUODENOSCOPY (EGD) WITH PROPOFOL N/A 05/29/2019   - Z-line, 38 cm from the incisors.   ESOPHAGOGASTRODUODENOSCOPY (EGD) WITH PROPOFOL N/A 03/24/2022   Procedure: ESOPHAGOGASTRODUODENOSCOPY (EGD) WITH PROPOFOL;  Surgeon: Dolores Frame, MD;  [facility];  Service: Gastroenterology;  Laterality: N/A;  730 ASA 1   HIATAL HERNIA REPAIR N/A 11/27/2016   Procedure: LAPAROSCOPIC REPAIR OF HIATAL HERNIA WITH NISSEN FUNDOPLICATION;  Surgeon: Avel Peace, MD;  Location: [facility];  Service: General;  Laterality:  N/A;   INCISION AND DRAINAGE OF WOUND Left 02/21/2016   forearm, minor   IR ANGIO INTRA EXTRACRAN SEL INTERNAL CAROTID BILAT MOD SED  11/03/2022   IR ANGIO VERTEBRAL SEL VERTEBRAL UNI L MOD SED  11/03/2022   LUMBAR LAMINECTOMY/DECOMPRESSION MICRODISCECTOMY Right 06/22/2013   Procedure: RIGHT LUMBAR TWO-THREE DISCECTOMY;  Surgeon: Clydene Fake, MD;  Location: [facility];  Service: Neurosurgery;  Laterality: Right;  Right    MALONEY DILATION N/A 09/20/2014   Procedure: Elease Hashimoto DILATION;  Surgeon: Malissa Hippo, MD;  [facility];  Service: Endoscopy;  Laterality: N/A;   MINOR IRRIGATION AND DEBRIDEMENT OF WOUND Left 02/21/2016   Procedure: MINOR IRRIGATION AND DEBRIDEMENT/REPAIR OF LEFT ARM WOUND;  Surgeon: Beverely Low, MD;  Location: [facility];  Service: Orthopedics;  Laterality: Left;   REVERSE SHOULDER ARTHROPLASTY Right 02/21/2016   Procedure: RIGHT REVERSE TOTAL SHOULDER ARTHROPLASTY;  Surgeon: Beverely Low, MD;  [facility];  Service: Orthopedics;  Laterality: Right;   REVERSE TOTAL SHOULDER ARTHROPLASTY Right 02/21/2016   SAVORY DILATION  03/24/2022   Procedure: SAVORY DILATION;  Surgeon: Marguerita Merles, Reuel Boom, MD;  [facility];  Service: Gastroenterology;;   SHOULDER ARTHROSCOPY W/ ROTATOR CUFF REPAIR Right 1990s   TOTAL SHOULDER REPLACEMENT     Right   VAGINAL HYSTERECTOMY     Patient Active Problem List   Diagnosis Date Noted   Elevated LFTs 08/24/2022   Depression 06/18/2022   TIA (transient ischemic attack) 06/17/2022   Chronic pain 06/17/2022   Hyperglycemia 06/17/2022   Anxiety 06/17/2022   Asthma, chronic 06/17/2022   Hypothyroidism 06/17/2022   Restless leg syndrome 06/17/2022   Nausea without vomiting 02/10/2022   Diarrhea 04/09/2021   Pain in right knee 11/23/2019   IBS (irritable bowel syndrome) 06/27/2019   Dysphagia 05/08/2019   Paresthesias 04/27/2019   Hiatal hernia with GERD without esophagitis 11/27/2016    Gastroesophageal reflux disease    Hiatal hernia    S/P shoulder replacement 02/21/2016   DYSPNEA 12/13/2009   CHEST PAIN, ATYPICAL 12/13/2009    PCP: Assunta Found MD  REFERRING PROVIDER: Bedelia Person, MD  REFERRING DIAG: M41.26 (ICD-10-CM) - Other idiopathic scoliosis, lumbar region  Rationale for Evaluation and Treatment: Rehabilitation  THERAPY DIAG:  Radiculopathy, lumbar region  Difficulty in walking, not elsewhere classified  Scoliosis of lumbar spine, unspecified scoliosis type  ONSET DATE: 3 years ago  SUBJECTIVE:  SUBJECTIVE STATEMENT: EVAL: Arrives to the clinic with low back pain (see below). Patient reports that the legs are numb and weak. Condition started for around 3 years without apparent reason. Patient reports that she has had multiple episode of falls. Patient reports that she had back surgery in 2014 and the area above the surgery collapsed. Recently, patient was told by her MD that she will have surgery again but will need physical therapy first to strengthen her back  PERTINENT HISTORY:  Back surgery/fusion in 2014  PAIN:  Are you having pain? Yes: NPRS scale: 7/10 Pain location: low back on the L side, shoots to the L buttocks to the thigh and sometimes to the L calf Pain description: aching, constant Aggravating factors: sitting ~ 30 minutes, walking and standing ~ 30 minutes Relieving factors: Ibuprofen  PRECAUTIONS: None  RED FLAGS: None   WEIGHT BEARING RESTRICTIONS: No  FALLS:  Has patient fallen in last 6 months? Yes. Number of falls 1  LIVING ENVIRONMENT: Lives with: lives with their spouse Lives in: Mobile home Stairs: Yes: External: 4 steps; bilateral but cannot reach both Has following equipment at home: None  OCCUPATION: retired  PLOF:  Independent and Independent with basic ADLs  PATIENT GOALS: "I hope it will help"  NEXT MD VISIT: March 2025  OBJECTIVE:  Note: Objective measures were completed at Evaluation unless otherwise noted.  DIAGNOSTIC FINDINGS:  06/22/23 MRI LUMBAR SPINE WITHOUT CONTRAST   TECHNIQUE: Multiplanar, multisequence MR imaging of the lumbar spine was performed. No intravenous contrast was administered.   COMPARISON:  Films lumbar spine 05/05/2023. MRI lumbar spine 12/21/2017.   FINDINGS: Segmentation:  Standard.   Alignment: As seen on the prior plain films, there is convex left lumbar scoliosis with the apex at L3.   Vertebrae: No fracture, evidence of discitis, or bone lesion. The patient is status post L4-5 discectomy and fusion as seen on the prior studies. There is degenerative endplate signal change eccentric to the right at T12-L1 and L1-2.   Conus medullaris and cauda equina: Conus extends to the T12-L1 level. Conus and cauda equina appear normal.   Paraspinal and other soft tissues: Left renal cyst is unchanged.   Disc levels:   T11-12: This level is incompletely imaged in the sagittal and axial planes. Shallow disc bulge is seen but no stenosis is identified.   T12-L1: Mild-to-moderate facet degenerative disease and a shallow disc bulge to the left. The central canal and right foramen are open. Mild left foraminal narrowing is present.   L1-2: There is loss of disc space height with a broad-based disc bulge and mild-to-moderate facet degenerative disease. Mild to moderate central canal stenosis appears unchanged. The foramina are open.   L2-3: Moderate bilateral facet arthropathy is worse on the right. There is a disc bulge with endplate spurring, more prominent to the right. Moderate narrowing in the right subarticular recess and foramen appears unchanged. The left foramen is open.   L3-4: Disc bulge and endplate spur, ligamentum flavum thickening and facet  arthropathy are seen. Moderate central canal stenosis appears worse than on the prior exam. Mild to moderate foraminal narrowing is greater on the right.   L4-5: Status post discectomy and fusion. No stenosis. Small postoperative seroma in the laminectomy bed is unchanged.   L5-S1: Advanced bilateral facet degenerative disease is present no disc bulge or protrusion. No stenosis.   IMPRESSION: 1. Status post L4-5 discectomy and fusion without stenosis. 2. Spondylosis at L3-4 appears worse than on the prior  exam. Moderate central canal stenosis is present. Mild to moderate foraminal narrowing is greater on the right. 3. No change in mild to moderate central canal stenosis at L1-2. 4. No change in moderate narrowing in the right subarticular recess and foramen at L2-3.  PATIENT SURVEYS:  Modified Oswestry 28/50 = 56%   COGNITION: Overall cognitive status: Within functional limits for tasks assessed     SENSATION: Light touch: Impaired l LE  MUSCLE LENGTH: Hamstrings: mild restriction on B Thomas test: slight restriction on B Piriformis test: slight restriction on B  POSTURE: rounded shoulders, forward head, and increased lumbar lordosis  PALPATION: No tenderness on major bony landmarks and muscle mass of the lumbar spine  LUMBAR ROM:   AROM eval  Flexion 50%  Extension 75%*  Right lateral flexion   Left lateral flexion   Right rotation 100%  Left rotation 50%   (Blank rows = not tested) *worsened radicular symptoms.   LOWER EXTREMITY ROM:     Active  Right eval Left eval  Hip flexion [facility]  Hip extension [facility]  Hip abduction [facility]  Hip adduction    Hip internal rotation    Hip external rotation    Knee flexion [facility] WFL  Knee extension [facility]  Ankle dorsiflexion [facility] WFL  Ankle plantarflexion [facility] WFL  Ankle inversion    Ankle eversion     (Blank rows = not tested)  LOWER EXTREMITY MMT:    MMT Right eval Left eval  Hip flexion 4 4  Hip  extension 3+ 3+  Hip abduction 4- 4-  Hip adduction    Hip internal rotation    Hip external rotation    Knee flexion 4+ 4+  Knee extension 4+ 4+  Ankle dorsiflexion 4+ 4+  Ankle plantarflexion 4+ 4+  Ankle inversion    Ankle eversion     (Blank rows = not tested)  LUMBAR SPECIAL TESTS:  Straight leg raise test: Positive, FABER test: Negative, and Double knee to chest relieved radicular symptoms  FUNCTIONAL TESTS:  5 times sit to stand: 11.90 sec 2 minute walk test: 566 ft with pain = 6/10  GAIT: Distance walked: 566 ft Assistive device utilized: None Level of assistance: Complete Independence Comments: done during  TREATMENT DATE:  11/03/23 Evaluation and patient education done                                                                                                                               PATIENT EDUCATION:  Education details: Educated on the pathoanatomy of low back pain. Educated on the goals and course of rehab.  Person educated: Patient Education method: Explanation Education comprehension: verbalized understanding  HOME EXERCISE PROGRAM: None provided to date  ASSESSMENT:  CLINICAL IMPRESSION: EVAL: Patient is a 68 y.o. female who was seen today for physical therapy evaluation and treatment for lumbar scoliosis. Patient's condition is further defined by difficulty with sitting, standing, and  walking due to pain, weakness, and decreased soft tissue extensibility. Skilled PT is required to address the impairments and functional limitations listed below.   OBJECTIVE IMPAIRMENTS: decreased activity tolerance, difficulty walking, decreased ROM, decreased strength, impaired flexibility, postural dysfunction, and pain.   ACTIVITY LIMITATIONS: carrying, lifting, bending, sitting, standing, squatting, and hygiene/grooming  PARTICIPATION LIMITATIONS: meal prep, cleaning, laundry, driving, shopping, community activity, and yard work  PERSONAL FACTORS:  Time since onset of injury/illness/exacerbation and 1 comorbidity: hx of low back surgery  are also affecting patient's functional outcome.   REHAB POTENTIAL: Fair    CLINICAL DECISION MAKING: Stable/uncomplicated  EVALUATION COMPLEXITY: Low   GOALS: Goals reviewed with patient? Yes  SHORT TERM GOALS: Target date: 11/17/23  Pt will demonstrate indep in HEP to facilitate carry-over of skilled services and improve functional outcomes Goal status: INITIAL  LONG TERM GOALS: Target date: 12/01/23  Pt will demonstrate a decrease in modified ODI score by 13-14% to demonstrate significant improvement in ADLs Baseline: 56% Goal status: INITIAL  2.  Pt will decrease 5TSTS by at least 3 seconds in order to demonstrate clinically significant improvement in LE strength  Baseline: 11.90 sec Goal status: INITIAL  3.  Pt will be able to do > or = to 566 ft with slight to mild pain (3-4/10) in order to demonstrate clinically significant improvement in community ambulation Baseline: 566 ft with 6/10 pain Goal status: INITIAL  4.  Pt will demonstrate increase in LE strength to 4-/5 to facilitate ease and safety in ambulation Baseline: 3+/5 Goal status: INITIAL  5.  Pt will be able to tolerate > 30 minutes of sitting with slight to mild pain (2-4/10) to facilitate ease in ADLs Baseline: see above Goal status: INITIAL  PLAN:  PT FREQUENCY: 2x/week  PT DURATION: 4 weeks  PLANNED INTERVENTIONS: [id number]- PT Re-evaluation, 97110-Therapeutic exercises, 97530- Therapeutic activity, 97112- Neuromuscular re-education, 97535- Self Care, 16109- Manual therapy, 97116- Gait training, Patient/Family education, Taping, Dry Needling, Cryotherapy, and Moist heat.  PLAN FOR NEXT SESSION: Provide HEP. Begin LE flexibility, core and hip strength   Iantha Fallen L. [place] Wenzlick, PT, DPT, OCS Board-Certified Clinical Specialist in Orthopedic PT PT Compact Privilege # ([place]): [id number] T 11/03/2023, 8:54 AM

## 2023-11-04 DIAGNOSIS — J3081 Allergic rhinitis due to animal (cat) (dog) hair and dander: Secondary | ICD-10-CM | POA: Diagnosis not present

## 2023-11-04 DIAGNOSIS — J3089 Other allergic rhinitis: Secondary | ICD-10-CM | POA: Diagnosis not present

## 2023-11-04 DIAGNOSIS — J301 Allergic rhinitis due to pollen: Secondary | ICD-10-CM | POA: Diagnosis not present

## 2023-11-09 ENCOUNTER — Ambulatory Visit (HOSPITAL_COMMUNITY): Payer: PPO | Attending: Neurosurgery

## 2023-11-09 DIAGNOSIS — R262 Difficulty in walking, not elsewhere classified: Secondary | ICD-10-CM | POA: Insufficient documentation

## 2023-11-09 DIAGNOSIS — M419 Scoliosis, unspecified: Secondary | ICD-10-CM | POA: Insufficient documentation

## 2023-11-09 DIAGNOSIS — M5416 Radiculopathy, lumbar region: Secondary | ICD-10-CM | POA: Diagnosis not present

## 2023-11-09 NOTE — Therapy (Signed)
 OUTPATIENT PHYSICAL THERAPY THORACOLUMBAR TREATMENT   Patient Name: Kathryn [name] MRN: [id number] DOB:22-Sep-1955, 68 y.o., female Today's Date: 11/09/2023  END OF SESSION:  PT End of Session - 11/09/23 0912     Visit Number 2    Number of Visits 8    Date for PT Re-Evaluation 12/01/23    Authorization Type [employer] (no auth)    PT Start Time 0845    PT Stop Time 0925    PT Time Calculation (min) 40 min    Activity Tolerance Patient tolerated treatment well    Behavior During Therapy [facility] for tasks assessed/performed              Past Medical History:  Diagnosis Date   Adrenal insufficiency (HCC)    occured following steroid shoulder injections ~ spring 2017   Anxiety    pt. reports that she has RLS- takes Xanax for calming her self so she can settle & sleep    Arthritis    shoulder & back, knees & leg     Asthma    aleery related   Atypical mole 11/12/2020   mod-severe- mid back   Chest pain, atypical    had cardiac workup - deemed related to anxiety   Chronic bronchitis (HCC)    "less since I quit smoking" (02/21/2016)   Chronic lower back pain    Dyspnea    related to spray paint that is put in  beer cans at her place of employment.  Pt. also reports that she has anxiety also that leads to SOB., Pt. reports her Mother just died a week ago     GERD (gastroesophageal reflux disease)    Guillain-Barre syndrome following vaccination (HCC) 1970's   post swine flu shot, hosp. for treatment for 1 month    H/O dizziness    H/O hiatal hernia    Hepatitis    hepatitis as a child; "got it from a little girl at school" make sick and vomiting   Herpes simplex    History of Guillain-Barre syndrome ~ 1978   "from the swine flu shot"   IBS (irritable bowel syndrome)    Pneumonia    PONV (postoperative nausea and vomiting)    Restless leg syndrome    Walking pneumonia ~ 2011   Past Surgical History:  Procedure Laterality Date   57 HOUR PH STUDY N/A  09/23/2016   Procedure: 24 HOUR PH STUDY;  Surgeon: Napoleon Form, MD;  Location: [facility];  Service: Endoscopy;  Laterality: N/A;   ANTERIOR CERVICAL DECOMP/DISCECTOMY FUSION  2000 & 2015   two times ago- C5 and C6, Dr. Clydene Fake, MD 2015   APPENDECTOMY  1980s   BACK SURGERY     BIOPSY  03/24/2022   Procedure: BIOPSY;  Surgeon: Dolores Frame, MD;  [facility]: [facility];  Service: Gastroenterology;;   BREAST BIOPSY Right    benign   CHOLECYSTECTOMY OPEN  1980s   COLONOSCOPY N/A 09/20/2014   rehman: Prep was excellent except she had thick layer of stool coating cecal and ascending colon mucosa. Cecal landmarks were well identified after vigorous washing. Small lymphoid polyp ablated via cold biopsy from appendiceal stump.Mucosa of rest of the colon was normal. Normal mucosa of rectum and anorectal junction.   ESOPHAGEAL DILATION N/A 05/29/2019   Procedure: ESOPHAGEAL DILATION;  Surgeon: Malissa Hippo, MD;  [facility];  Service: Endoscopy;  Laterality: N/A;   ESOPHAGEAL MANOMETRY N/A 09/23/2016  Procedure: ESOPHAGEAL MANOMETRY (EM);  Surgeon: Napoleon Form, MD;  Location: [facility];  Service: Endoscopy;  Laterality: N/A;   ESOPHAGOGASTRODUODENOSCOPY N/A 09/20/2014   Procedure: ESOPHAGOGASTRODUODENOSCOPY (EGD);  Surgeon: Malissa Hippo, MD;  [facility];  Service: Endoscopy;  Laterality: N/A;   ESOPHAGOGASTRODUODENOSCOPY (EGD) WITH PROPOFOL N/A 05/29/2019   - Z-line, 38 cm from the incisors.   ESOPHAGOGASTRODUODENOSCOPY (EGD) WITH PROPOFOL N/A 03/24/2022   Procedure: ESOPHAGOGASTRODUODENOSCOPY (EGD) WITH PROPOFOL;  Surgeon: Dolores Frame, MD;  [facility];  Service: Gastroenterology;  Laterality: N/A;  730 ASA 1   HIATAL HERNIA REPAIR N/A 11/27/2016   Procedure: LAPAROSCOPIC REPAIR OF HIATAL HERNIA WITH NISSEN FUNDOPLICATION;  Surgeon: Avel Peace, MD;  Location: [facility];  Service: General;  Laterality:  N/A;   INCISION AND DRAINAGE OF WOUND Left 02/21/2016   forearm, minor   IR ANGIO INTRA EXTRACRAN SEL INTERNAL CAROTID BILAT MOD SED  11/03/2022   IR ANGIO VERTEBRAL SEL VERTEBRAL UNI L MOD SED  11/03/2022   LUMBAR LAMINECTOMY/DECOMPRESSION MICRODISCECTOMY Right 06/22/2013   Procedure: RIGHT LUMBAR TWO-THREE DISCECTOMY;  Surgeon: Clydene Fake, MD;  Location: [facility];  Service: Neurosurgery;  Laterality: Right;  Right    MALONEY DILATION N/A 09/20/2014   Procedure: Elease Hashimoto DILATION;  Surgeon: Malissa Hippo, MD;  [facility];  Service: Endoscopy;  Laterality: N/A;   MINOR IRRIGATION AND DEBRIDEMENT OF WOUND Left 02/21/2016   Procedure: MINOR IRRIGATION AND DEBRIDEMENT/REPAIR OF LEFT ARM WOUND;  Surgeon: Beverely Low, MD;  Location: [facility];  Service: Orthopedics;  Laterality: Left;   REVERSE SHOULDER ARTHROPLASTY Right 02/21/2016   Procedure: RIGHT REVERSE TOTAL SHOULDER ARTHROPLASTY;  Surgeon: Beverely Low, MD;  [facility];  Service: Orthopedics;  Laterality: Right;   REVERSE TOTAL SHOULDER ARTHROPLASTY Right 02/21/2016   SAVORY DILATION  03/24/2022   Procedure: SAVORY DILATION;  Surgeon: Marguerita Merles, Reuel Boom, MD;  [facility];  Service: Gastroenterology;;   SHOULDER ARTHROSCOPY W/ ROTATOR CUFF REPAIR Right 1990s   TOTAL SHOULDER REPLACEMENT     Right   VAGINAL HYSTERECTOMY     Patient Active Problem List   Diagnosis Date Noted   Elevated LFTs 08/24/2022   Depression 06/18/2022   TIA (transient ischemic attack) 06/17/2022   Chronic pain 06/17/2022   Hyperglycemia 06/17/2022   Anxiety 06/17/2022   Asthma, chronic 06/17/2022   Hypothyroidism 06/17/2022   Restless leg syndrome 06/17/2022   Nausea without vomiting 02/10/2022   Diarrhea 04/09/2021   Pain in right knee 11/23/2019   IBS (irritable bowel syndrome) 06/27/2019   Dysphagia 05/08/2019   Paresthesias 04/27/2019   Hiatal hernia with GERD without esophagitis 11/27/2016    Gastroesophageal reflux disease    Hiatal hernia    S/P shoulder replacement 02/21/2016   DYSPNEA 12/13/2009   CHEST PAIN, ATYPICAL 12/13/2009    PCP: Assunta Found MD  REFERRING PROVIDER: Bedelia Person, MD  REFERRING DIAG: M41.26 (ICD-10-CM) - Other idiopathic scoliosis, lumbar region  Rationale for Evaluation and Treatment: Rehabilitation  THERAPY DIAG:  Difficulty in walking, not elsewhere classified  Scoliosis of lumbar spine, unspecified scoliosis type  Radiculopathy, lumbar region  ONSET DATE: 3 years ago  SUBJECTIVE:  SUBJECTIVE STATEMENT: Patient cleaned her house yesterday which aggravated her back and legs. Currently reports of pain = 7/10.  EVAL: Arrives to the clinic with low back pain (see below). Patient reports that the legs are numb and weak. Condition started for around 3 years without apparent reason. Patient reports that she has had multiple episode of falls. Patient reports that she had back surgery in 2014 and the area above the surgery collapsed. Recently, patient was told by her MD that she will have surgery again but will need physical therapy first to strengthen her back  PERTINENT HISTORY:  Back surgery/fusion in 2014  PAIN:  Are you having pain? Yes: NPRS scale: 7/10 Pain location: low back on the L side, shoots to the L buttocks to the thigh and sometimes to the L calf Pain description: aching, constant Aggravating factors: sitting ~ 30 minutes, walking and standing ~ 30 minutes Relieving factors: Ibuprofen  PRECAUTIONS: None  RED FLAGS: None   WEIGHT BEARING RESTRICTIONS: No  FALLS:  Has patient fallen in last 6 months? Yes. Number of falls 1  LIVING ENVIRONMENT: Lives with: lives with their spouse Lives in: Mobile home Stairs: Yes: External: 4  steps; bilateral but cannot reach both Has following equipment at home: None  OCCUPATION: retired  PLOF: Independent and Independent with basic ADLs  PATIENT GOALS: "I hope it will help"  NEXT MD VISIT: March 2025  OBJECTIVE:  Note: Objective measures were completed at Evaluation unless otherwise noted.  DIAGNOSTIC FINDINGS:  06/22/23 MRI LUMBAR SPINE WITHOUT CONTRAST   TECHNIQUE: Multiplanar, multisequence MR imaging of the lumbar spine was performed. No intravenous contrast was administered.   COMPARISON:  Films lumbar spine 05/05/2023. MRI lumbar spine 12/21/2017.   FINDINGS: Segmentation:  Standard.   Alignment: As seen on the prior plain films, there is convex left lumbar scoliosis with the apex at L3.   Vertebrae: No fracture, evidence of discitis, or bone lesion. The patient is status post L4-5 discectomy and fusion as seen on the prior studies. There is degenerative endplate signal change eccentric to the right at T12-L1 and L1-2.   Conus medullaris and cauda equina: Conus extends to the T12-L1 level. Conus and cauda equina appear normal.   Paraspinal and other soft tissues: Left renal cyst is unchanged.   Disc levels:   T11-12: This level is incompletely imaged in the sagittal and axial planes. Shallow disc bulge is seen but no stenosis is identified.   T12-L1: Mild-to-moderate facet degenerative disease and a shallow disc bulge to the left. The central canal and right foramen are open. Mild left foraminal narrowing is present.   L1-2: There is loss of disc space height with a broad-based disc bulge and mild-to-moderate facet degenerative disease. Mild to moderate central canal stenosis appears unchanged. The foramina are open.   L2-3: Moderate bilateral facet arthropathy is worse on the right. There is a disc bulge with endplate spurring, more prominent to the right. Moderate narrowing in the right subarticular recess and foramen appears unchanged.  The left foramen is open.   L3-4: Disc bulge and endplate spur, ligamentum flavum thickening and facet arthropathy are seen. Moderate central canal stenosis appears worse than on the prior exam. Mild to moderate foraminal narrowing is greater on the right.   L4-5: Status post discectomy and fusion. No stenosis. Small postoperative seroma in the laminectomy bed is unchanged.   L5-S1: Advanced bilateral facet degenerative disease is present no disc bulge or protrusion. No stenosis.   IMPRESSION: 1.  Status post L4-5 discectomy and fusion without stenosis. 2. Spondylosis at L3-4 appears worse than on the prior exam. Moderate central canal stenosis is present. Mild to moderate foraminal narrowing is greater on the right. 3. No change in mild to moderate central canal stenosis at L1-2. 4. No change in moderate narrowing in the right subarticular recess and foramen at L2-3.  PATIENT SURVEYS:  Modified Oswestry 28/50 = 56%   COGNITION: Overall cognitive status: Within functional limits for tasks assessed     SENSATION: Light touch: Impaired l LE  MUSCLE LENGTH: Hamstrings: mild restriction on B Thomas test: slight restriction on B Piriformis test: slight restriction on B  POSTURE: rounded shoulders, forward head, and increased lumbar lordosis  PALPATION: No tenderness on major bony landmarks and muscle mass of the lumbar spine  LUMBAR ROM:   AROM eval  Flexion 50%  Extension 75%*  Right lateral flexion   Left lateral flexion   Right rotation 100%  Left rotation 50%   (Blank rows = not tested) *worsened radicular symptoms.   LOWER EXTREMITY ROM:     Active  Right eval Left eval  Hip flexion [facility]  Hip extension [facility]  Hip abduction [facility]  Hip adduction    Hip internal rotation    Hip external rotation    Knee flexion [facility] WFL  Knee extension [facility]  Ankle dorsiflexion [facility] WFL  Ankle plantarflexion [facility] WFL  Ankle inversion    Ankle eversion     (Blank  rows = not tested)  LOWER EXTREMITY MMT:    MMT Right eval Left eval  Hip flexion 4 4  Hip extension 3+ 3+  Hip abduction 4- 4-  Hip adduction    Hip internal rotation    Hip external rotation    Knee flexion 4+ 4+  Knee extension 4+ 4+  Ankle dorsiflexion 4+ 4+  Ankle plantarflexion 4+ 4+  Ankle inversion    Ankle eversion     (Blank rows = not tested)  LUMBAR SPECIAL TESTS:  Straight leg raise test: Positive, FABER test: Negative, and Double knee to chest relieved radicular symptoms  FUNCTIONAL TESTS:  5 times sit to stand: 11.90 sec 2 minute walk test: 566 ft with pain = 6/10  GAIT: Distance walked: 566 ft Assistive device utilized: None Level of assistance: Complete Independence Comments: done during  TREATMENT DATE:  11/09/23 Reviewed goals NuStep, seat 8, level 1, > 60 SPM, 5' Seated hamstring stretch x 30" x 3 Post pelvic tilts x 3" x 10 x 2 Double knee to chest x 15" x 5 Bridging x 3" x 10 x 2 SLR with post pelvic tilt x 10 x 2 on each Seated abdominal press with physioball x 3" x 10 x 2  11/03/23 Evaluation and patient education done  PATIENT EDUCATION:  Education details: Educated on the pathoanatomy of low back pain. Educated on the goals and course of rehab.  Person educated: Patient Education method: Explanation Education comprehension: verbalized understanding  HOME EXERCISE PROGRAM: Access Code: [email or web address] URL: [email or web address] Date: 11/09/2023 Prepared by: Krystal Clark  Exercises - Seated Hamstring Stretch  - 1-2 x daily - 5-7 x weekly - 3 reps - 30 hold - Supine Posterior Pelvic Tilt  - 1-2 x daily - 5-7 x weekly - 2 sets - 10 reps - 3 hold - Supine Double Knee to Chest  - 1-2 x daily - 5-7 x weekly - 5 reps - 15 hold - Supine Active Straight Leg Raise  - 1-2 x daily - 5-7 x weekly - 2 sets -  10 reps - Seated Abdominal Press into [employer]  - 1-2 x daily - 5-7 x weekly - 2 sets - 10 reps - 3 hold  ASSESSMENT:  CLINICAL IMPRESSION: Interventions today were geared towards  pain centralization, LE flexibility and core strengthening. Tolerated all activities without worsening of symptoms. However, patient reported of tingling on the foot when L single knee to chest was attempted so it was discontinued. Demonstrated appropriate levels of fatigue. Provided slight amount of cueing to ensure correct execution of activity with good carry-over. To date, skilled PT is required to address the impairments and improve function.  EVAL: Patient is a 68 y.o. female who was seen today for physical therapy evaluation and treatment for lumbar scoliosis. Patient's condition is further defined by difficulty with sitting, standing, and walking due to pain, weakness, and decreased soft tissue extensibility. Skilled PT is required to address the impairments and functional limitations listed below.   OBJECTIVE IMPAIRMENTS: decreased activity tolerance, difficulty walking, decreased ROM, decreased strength, impaired flexibility, postural dysfunction, and pain.   ACTIVITY LIMITATIONS: carrying, lifting, bending, sitting, standing, squatting, and hygiene/grooming  PARTICIPATION LIMITATIONS: meal prep, cleaning, laundry, driving, shopping, community activity, and yard work  PERSONAL FACTORS: Time since onset of injury/illness/exacerbation and 1 comorbidity: hx of low back surgery  are also affecting patient's functional outcome.   REHAB POTENTIAL: Fair    CLINICAL DECISION MAKING: Stable/uncomplicated  EVALUATION COMPLEXITY: Low   GOALS: Goals reviewed with patient? Yes  SHORT TERM GOALS: Target date: 11/17/23  Pt will demonstrate indep in HEP to facilitate carry-over of skilled services and improve functional outcomes Goal status: INITIAL  LONG TERM GOALS: Target date: 12/01/23  Pt will demonstrate  a decrease in modified ODI score by 13-14% to demonstrate significant improvement in ADLs Baseline: 56% Goal status: INITIAL  2.  Pt will decrease 5TSTS by at least 3 seconds in order to demonstrate clinically significant improvement in LE strength  Baseline: 11.90 sec Goal status: INITIAL  3.  Pt will be able to do > or = to 566 ft with slight to mild pain (3-4/10) in order to demonstrate clinically significant improvement in community ambulation Baseline: 566 ft with 6/10 pain Goal status: INITIAL  4.  Pt will demonstrate increase in LE strength to 4-/5 to facilitate ease and safety in ambulation Baseline: 3+/5 Goal status: INITIAL  5.  Pt will be able to tolerate > 30 minutes of sitting with slight to mild pain (2-4/10) to facilitate ease in ADLs Baseline: see above Goal status: INITIAL  PLAN:  PT FREQUENCY: 2x/week  PT DURATION: 4 weeks  PLANNED INTERVENTIONS: [id number]- PT Re-evaluation, 97110-Therapeutic exercises, 97530- Therapeutic activity, 97112- Neuromuscular re-education, 97535- Self Care, [id number]- Manual therapy, [id number]- Gait training,  Patient/Family education, Taping, Dry Needling, Cryotherapy, and Moist heat.  PLAN FOR NEXT SESSION: Continue POC and may progress as tolerated with emphasis on LE flexibility, core and hip strength   Iantha Fallen L. Breanah Faddis, PT, DPT, OCS Board-Certified Clinical Specialist in Orthopedic PT PT Compact Privilege # ([place]): [id number] T 11/09/2023, 9:13 AM

## 2023-11-11 ENCOUNTER — Encounter (HOSPITAL_COMMUNITY): Payer: Self-pay

## 2023-11-11 ENCOUNTER — Ambulatory Visit (HOSPITAL_COMMUNITY): Payer: PPO

## 2023-11-11 DIAGNOSIS — M419 Scoliosis, unspecified: Secondary | ICD-10-CM

## 2023-11-11 DIAGNOSIS — J3089 Other allergic rhinitis: Secondary | ICD-10-CM | POA: Diagnosis not present

## 2023-11-11 DIAGNOSIS — R262 Difficulty in walking, not elsewhere classified: Secondary | ICD-10-CM | POA: Diagnosis not present

## 2023-11-11 DIAGNOSIS — J301 Allergic rhinitis due to pollen: Secondary | ICD-10-CM | POA: Diagnosis not present

## 2023-11-11 DIAGNOSIS — M5416 Radiculopathy, lumbar region: Secondary | ICD-10-CM

## 2023-11-11 DIAGNOSIS — J3081 Allergic rhinitis due to animal (cat) (dog) hair and dander: Secondary | ICD-10-CM | POA: Diagnosis not present

## 2023-11-11 NOTE — Therapy (Signed)
 OUTPATIENT PHYSICAL THERAPY THORACOLUMBAR TREATMENT   Patient Name: Kathryn [name] MRN: [id number] DOB:06-12-1956, 68 y.o., female Today's Date: 11/11/2023  END OF SESSION:  PT End of Session - 11/11/23 0720     Visit Number 3    Number of Visits 8    Date for PT Re-Evaluation 12/01/23    Authorization Type [employer] (no auth)    PT Start Time 0721    PT Stop Time 0759    PT Time Calculation (min) 38 min    Activity Tolerance Patient tolerated treatment well    Behavior During Therapy [facility] for tasks assessed/performed              Past Medical History:  Diagnosis Date   Adrenal insufficiency (HCC)    occured following steroid shoulder injections ~ spring 2017   Anxiety    pt. reports that she has RLS- takes Xanax for calming her self so she can settle & sleep    Arthritis    shoulder & back, knees & leg     Asthma    aleery related   Atypical mole 11/12/2020   mod-severe- mid back   Chest pain, atypical    had cardiac workup - deemed related to anxiety   Chronic bronchitis (HCC)    "less since I quit smoking" (02/21/2016)   Chronic lower back pain    Dyspnea    related to spray paint that is put in  beer cans at her place of employment.  Pt. also reports that she has anxiety also that leads to SOB., Pt. reports her Mother just died a week ago     GERD (gastroesophageal reflux disease)    Guillain-Barre syndrome following vaccination (HCC) 1970's   post swine flu shot, hosp. for treatment for 1 month    H/O dizziness    H/O hiatal hernia    Hepatitis    hepatitis as a child; "got it from a little girl at school" make sick and vomiting   Herpes simplex    History of Guillain-Barre syndrome ~ 1978   "from the swine flu shot"   IBS (irritable bowel syndrome)    Pneumonia    PONV (postoperative nausea and vomiting)    Restless leg syndrome    Walking pneumonia ~ 2011   Past Surgical History:  Procedure Laterality Date   58 HOUR PH STUDY N/A  09/23/2016   Procedure: 24 HOUR PH STUDY;  Surgeon: Napoleon Form, MD;  Location: [facility];  Service: Endoscopy;  Laterality: N/A;   ANTERIOR CERVICAL DECOMP/DISCECTOMY FUSION  2000 & 2015   two times ago- C5 and C6, Dr. Clydene Fake, MD 2015   APPENDECTOMY  1980s   BACK SURGERY     BIOPSY  03/24/2022   Procedure: BIOPSY;  Surgeon: Dolores Frame, MD;  [facility]: [facility];  Service: Gastroenterology;;   BREAST BIOPSY Right    benign   CHOLECYSTECTOMY OPEN  1980s   COLONOSCOPY N/A 09/20/2014   rehman: Prep was excellent except she had thick layer of stool coating cecal and ascending colon mucosa. Cecal landmarks were well identified after vigorous washing. Small lymphoid polyp ablated via cold biopsy from appendiceal stump.Mucosa of rest of the colon was normal. Normal mucosa of rectum and anorectal junction.   ESOPHAGEAL DILATION N/A 05/29/2019   Procedure: ESOPHAGEAL DILATION;  Surgeon: Malissa Hippo, MD;  [facility];  Service: Endoscopy;  Laterality: N/A;   ESOPHAGEAL MANOMETRY N/A 09/23/2016  Procedure: ESOPHAGEAL MANOMETRY (EM);  Surgeon: Napoleon Form, MD;  [facility]: [facility];  Service: Endoscopy;  Laterality: N/A;   ESOPHAGOGASTRODUODENOSCOPY N/A 09/20/2014   Procedure: ESOPHAGOGASTRODUODENOSCOPY (EGD);  Surgeon: Malissa Hippo, MD;  [facility];  Service: Endoscopy;  Laterality: N/A;   ESOPHAGOGASTRODUODENOSCOPY (EGD) WITH PROPOFOL N/A 05/29/2019   - Z-line, 38 cm from the incisors.   ESOPHAGOGASTRODUODENOSCOPY (EGD) WITH PROPOFOL N/A 03/24/2022   Procedure: ESOPHAGOGASTRODUODENOSCOPY (EGD) WITH PROPOFOL;  Surgeon: Dolores Frame, MD;  [facility];  Service: Gastroenterology;  Laterality: N/A;  730 ASA 1   HIATAL HERNIA REPAIR N/A 11/27/2016   Procedure: LAPAROSCOPIC REPAIR OF HIATAL HERNIA WITH NISSEN FUNDOPLICATION;  Surgeon: Avel Peace, MD;  Location: [facility];  Service: General;  Laterality:  N/A;   INCISION AND DRAINAGE OF WOUND Left 02/21/2016   forearm, minor   IR ANGIO INTRA EXTRACRAN SEL INTERNAL CAROTID BILAT MOD SED  11/03/2022   IR ANGIO VERTEBRAL SEL VERTEBRAL UNI L MOD SED  11/03/2022   LUMBAR LAMINECTOMY/DECOMPRESSION MICRODISCECTOMY Right 06/22/2013   Procedure: RIGHT LUMBAR TWO-THREE DISCECTOMY;  Surgeon: Clydene Fake, MD;  Location: [facility];  Service: Neurosurgery;  Laterality: Right;  Right    MALONEY DILATION N/A 09/20/2014   Procedure: Elease Hashimoto DILATION;  Surgeon: Malissa Hippo, MD;  [facility];  Service: Endoscopy;  Laterality: N/A;   MINOR IRRIGATION AND DEBRIDEMENT OF WOUND Left 02/21/2016   Procedure: MINOR IRRIGATION AND DEBRIDEMENT/REPAIR OF LEFT ARM WOUND;  Surgeon: Beverely Low, MD;  Location: [facility];  Service: Orthopedics;  Laterality: Left;   REVERSE SHOULDER ARTHROPLASTY Right 02/21/2016   Procedure: RIGHT REVERSE TOTAL SHOULDER ARTHROPLASTY;  Surgeon: Beverely Low, MD;  Location: [facility];  Service: Orthopedics;  Laterality: Right;   REVERSE TOTAL SHOULDER ARTHROPLASTY Right 02/21/2016   SAVORY DILATION  03/24/2022   Procedure: SAVORY DILATION;  Surgeon: Marguerita Merles, Reuel Boom, MD;  [facility];  Service: Gastroenterology;;   SHOULDER ARTHROSCOPY W/ ROTATOR CUFF REPAIR Right 1990s   TOTAL SHOULDER REPLACEMENT     Right   VAGINAL HYSTERECTOMY     Patient Active Problem List   Diagnosis Date Noted   Elevated LFTs 08/24/2022   Depression 06/18/2022   TIA (transient ischemic attack) 06/17/2022   Chronic pain 06/17/2022   Hyperglycemia 06/17/2022   Anxiety 06/17/2022   Asthma, chronic 06/17/2022   Hypothyroidism 06/17/2022   Restless leg syndrome 06/17/2022   Nausea without vomiting 02/10/2022   Diarrhea 04/09/2021   Pain in right knee 11/23/2019   IBS (irritable bowel syndrome) 06/27/2019   Dysphagia 05/08/2019   Paresthesias 04/27/2019   Hiatal hernia with GERD without esophagitis 11/27/2016    Gastroesophageal reflux disease    Hiatal hernia    S/P shoulder replacement 02/21/2016   DYSPNEA 12/13/2009   CHEST PAIN, ATYPICAL 12/13/2009    PCP: Assunta Found MD  REFERRING PROVIDER: Bedelia Person, MD  REFERRING DIAG: M41.26 (ICD-10-CM) - Other idiopathic scoliosis, lumbar region  Rationale for Evaluation and Treatment: Rehabilitation  THERAPY DIAG:  Difficulty in walking, not elsewhere classified  Scoliosis of lumbar spine, unspecified scoliosis type  Radiculopathy, lumbar region  ONSET DATE: 3 years ago  SUBJECTIVE:  SUBJECTIVE STATEMENT: Pain scale 7/10 LBP and radicular symptoms posterior down to toes Lt LE>Rt.  EVAL: Arrives to the clinic with low back pain (see below). Patient reports that the legs are numb and weak. Condition started for around 3 years without apparent reason. Patient reports that she has had multiple episode of falls. Patient reports that she had back surgery in 2014 and the area above the surgery collapsed. Recently, patient was told by her MD that she will have surgery again but will need physical therapy first to strengthen her back  PERTINENT HISTORY:  Back surgery/fusion in 2014  PAIN:  Are you having pain? Yes: NPRS scale: 7/10 Pain location: low back on the L side, shoots to the L buttocks to the thigh and sometimes to the L calf Pain description: aching, constant Aggravating factors: sitting ~ 30 minutes, walking and standing ~ 30 minutes Relieving factors: Ibuprofen  PRECAUTIONS: None  RED FLAGS: None   WEIGHT BEARING RESTRICTIONS: No  FALLS:  Has patient fallen in last 6 months? Yes. Number of falls 1  LIVING ENVIRONMENT: Lives with: lives with their spouse Lives in: Mobile home Stairs: Yes: External: 4 steps; bilateral but cannot reach  both Has following equipment at home: None  OCCUPATION: retired  PLOF: Independent and Independent with basic ADLs  PATIENT GOALS: "I hope it will help"  NEXT MD VISIT: March 2025  OBJECTIVE:  Note: Objective measures were completed at Evaluation unless otherwise noted.  DIAGNOSTIC FINDINGS:  06/22/23 MRI LUMBAR SPINE WITHOUT CONTRAST   TECHNIQUE: Multiplanar, multisequence MR imaging of the lumbar spine was performed. No intravenous contrast was administered.   COMPARISON:  Films lumbar spine 05/05/2023. MRI lumbar spine 12/21/2017.   FINDINGS: Segmentation:  Standard.   Alignment: As seen on the prior plain films, there is convex left lumbar scoliosis with the apex at L3.   Vertebrae: No fracture, evidence of discitis, or bone lesion. The patient is status post L4-5 discectomy and fusion as seen on the prior studies. There is degenerative endplate signal change eccentric to the right at T12-L1 and L1-2.   Conus medullaris and cauda equina: Conus extends to the T12-L1 level. Conus and cauda equina appear normal.   Paraspinal and other soft tissues: Left renal cyst is unchanged.   Disc levels:   T11-12: This level is incompletely imaged in the sagittal and axial planes. Shallow disc bulge is seen but no stenosis is identified.   T12-L1: Mild-to-moderate facet degenerative disease and a shallow disc bulge to the left. The central canal and right foramen are open. Mild left foraminal narrowing is present.   L1-2: There is loss of disc space height with a broad-based disc bulge and mild-to-moderate facet degenerative disease. Mild to moderate central canal stenosis appears unchanged. The foramina are open.   L2-3: Moderate bilateral facet arthropathy is worse on the right. There is a disc bulge with endplate spurring, more prominent to the right. Moderate narrowing in the right subarticular recess and foramen appears unchanged. The left foramen is open.    L3-4: Disc bulge and endplate spur, ligamentum flavum thickening and facet arthropathy are seen. Moderate central canal stenosis appears worse than on the prior exam. Mild to moderate foraminal narrowing is greater on the right.   L4-5: Status post discectomy and fusion. No stenosis. Small postoperative seroma in the laminectomy bed is unchanged.   L5-S1: Advanced bilateral facet degenerative disease is present no disc bulge or protrusion. No stenosis.   IMPRESSION: 1. Status post L4-5 discectomy  and fusion without stenosis. 2. Spondylosis at L3-4 appears worse than on the prior exam. Moderate central canal stenosis is present. Mild to moderate foraminal narrowing is greater on the right. 3. No change in mild to moderate central canal stenosis at L1-2. 4. No change in moderate narrowing in the right subarticular recess and foramen at L2-3.  PATIENT SURVEYS:  Modified Oswestry 28/50 = 56%   COGNITION: Overall cognitive status: Within functional limits for tasks assessed     SENSATION: Light touch: Impaired l LE  MUSCLE LENGTH: Hamstrings: mild restriction on B Thomas test: slight restriction on B Piriformis test: slight restriction on B  POSTURE: rounded shoulders, forward head, and increased lumbar lordosis  PALPATION: No tenderness on major bony landmarks and muscle mass of the lumbar spine  LUMBAR ROM:   AROM eval  Flexion 50%  Extension 75%*  Right lateral flexion   Left lateral flexion   Right rotation 100%  Left rotation 50%   (Blank rows = not tested) *worsened radicular symptoms.   LOWER EXTREMITY ROM:     Active  Right eval Left eval  Hip flexion [facility]  Hip extension [facility]  Hip abduction [facility]  Hip adduction    Hip internal rotation    Hip external rotation    Knee flexion [facility] WFL  Knee extension [facility]  Ankle dorsiflexion [facility] WFL  Ankle plantarflexion [facility] WFL  Ankle inversion    Ankle eversion     (Blank rows = not tested)  LOWER  EXTREMITY MMT:    MMT Right eval Left eval  Hip flexion 4 4  Hip extension 3+ 3+  Hip abduction 4- 4-  Hip adduction    Hip internal rotation    Hip external rotation    Knee flexion 4+ 4+  Knee extension 4+ 4+  Ankle dorsiflexion 4+ 4+  Ankle plantarflexion 4+ 4+  Ankle inversion    Ankle eversion     (Blank rows = not tested)  LUMBAR SPECIAL TESTS:  Straight leg raise test: Positive, FABER test: Negative, and Double knee to chest relieved radicular symptoms  FUNCTIONAL TESTS:  5 times sit to stand: 11.90 sec 2 minute walk test: 566 ft with pain = 6/10  GAIT: Distance walked: 566 ft Assistive device utilized: None Level of assistance: Complete Independence Comments: done during  TREATMENT DATE:  11/11/23: Supine:  Decompression 2-5 5x 5" Deep breathing with 1 hand chest/1 on stomach x 3 min TrA activation paired with exhale x 2 min SLR with post pelvic tilt x 10 x 2 on each Bridging x 3" x 10 x 2 Hamstring stretch 3x 30" (hands behind knee)  Sidelying: Clam with RTB 10x 5"  11/09/23 Reviewed goals NuStep, seat 8, level 1, > 60 SPM, 5' Seated hamstring stretch x 30" x 3 Post pelvic tilts x 3" x 10 x 2 Double knee to chest x 15" x 5 Bridging x 3" x 10 x 2 SLR with post pelvic tilt x 10 x 2 on each Seated abdominal press with physioball x 3" x 10 x 2  11/03/23 Evaluation and patient education done  PATIENT EDUCATION:  Education details: Educated on the pathoanatomy of low back pain. Educated on the goals and course of rehab.  Person educated: Patient Education method: Explanation Education comprehension: verbalized understanding  HOME EXERCISE PROGRAM: Access Code: [email or web address] URL: [email or web address] Date: 11/09/2023 Prepared by: Krystal Clark  Exercises - Seated Hamstring Stretch  - 1-2 x daily - 5-7 x weekly  - 3 reps - 30 hold - Supine Posterior Pelvic Tilt  - 1-2 x daily - 5-7 x weekly - 2 sets - 10 reps - 3 hold - Supine Double Knee to Chest  - 1-2 x daily - 5-7 x weekly - 5 reps - 15 hold - Supine Active Straight Leg Raise  - 1-2 x daily - 5-7 x weekly - 2 sets - 10 reps - Seated Abdominal Press into [employer]  - 1-2 x daily - 5-7 x weekly - 2 sets - 10 reps - 3 hold  11/11/23: Access Code: [email or web address] URL: [email or web address] Date: 11/11/2023 Prepared by: Becky Sax  Exercises -Decompression - Supine Bridge  - 1-2 x daily - 7 x weekly - 3 sets - 10 reps -clam with RTB ASSESSMENT:  CLINICAL IMPRESSION: Session focus with core and proximal strengthening and stretches for mobility.  Pt with tendency to hold breath during TrA/posterior pelvic tilt, NMR cueing with tactile and verbal cueing to improve activation paired with exhalation.  Pt tolerated well to session with no reports of increased pain or radicular symptoms.  EOS pain reduced to 5/10 and reports of improved centralization only in buttocks.  Given additional gluteal strengthening HEP with printout given and verbalized understanding.    EVAL: Patient is a 68 y.o. female who was seen today for physical therapy evaluation and treatment for lumbar scoliosis. Patient's condition is further defined by difficulty with sitting, standing, and walking due to pain, weakness, and decreased soft tissue extensibility. Skilled PT is required to address the impairments and functional limitations listed below.   OBJECTIVE IMPAIRMENTS: decreased activity tolerance, difficulty walking, decreased ROM, decreased strength, impaired flexibility, postural dysfunction, and pain.   ACTIVITY LIMITATIONS: carrying, lifting, bending, sitting, standing, squatting, and hygiene/grooming  PARTICIPATION LIMITATIONS: meal prep, cleaning, laundry, driving, shopping, community activity, and yard work  PERSONAL FACTORS: Time since onset of  injury/illness/exacerbation and 1 comorbidity: hx of low back surgery  are also affecting patient's functional outcome.   REHAB POTENTIAL: Fair    CLINICAL DECISION MAKING: Stable/uncomplicated  EVALUATION COMPLEXITY: Low   GOALS: Goals reviewed with patient? Yes  SHORT TERM GOALS: Target date: 11/17/23  Pt will demonstrate indep in HEP to facilitate carry-over of skilled services and improve functional outcomes Goal status: INITIAL  LONG TERM GOALS: Target date: 12/01/23  Pt will demonstrate a decrease in modified ODI score by 13-14% to demonstrate significant improvement in ADLs Baseline: 56% Goal status: INITIAL  2.  Pt will decrease 5TSTS by at least 3 seconds in order to demonstrate clinically significant improvement in LE strength  Baseline: 11.90 sec Goal status: INITIAL  3.  Pt will be able to do > or = to 566 ft with slight to mild pain (3-4/10) in order to demonstrate clinically significant improvement in community ambulation Baseline: 566 ft with 6/10 pain Goal status: INITIAL  4.  Pt will demonstrate increase in LE strength to 4-/5 to facilitate ease and safety in ambulation Baseline: 3+/5 Goal status: INITIAL  5.  Pt will be able to tolerate > 30 minutes of sitting with slight to mild pain (2-4/10) to facilitate ease  in ADLs Baseline: see above Goal status: INITIAL  PLAN:  PT FREQUENCY: 2x/week  PT DURATION: 4 weeks  PLANNED INTERVENTIONS: [id number]- PT Re-evaluation, 97110-Therapeutic exercises, 97530- Therapeutic activity, 97112- Neuromuscular re-education, 97535- Self Care, [id number]- Manual therapy, 97116- Gait training, Patient/Family education, Taping, Dry Needling, Cryotherapy, and Moist heat.  PLAN FOR NEXT SESSION: Continue POC and may progress as tolerated with emphasis on LE flexibility, core and hip strength   Becky Sax, LPTA/CLT; Rowe [place]ack [phone]  11/11/2023, 8:11 AM

## 2023-11-16 ENCOUNTER — Ambulatory Visit (HOSPITAL_COMMUNITY): Payer: PPO

## 2023-11-16 DIAGNOSIS — R262 Difficulty in walking, not elsewhere classified: Secondary | ICD-10-CM

## 2023-11-16 DIAGNOSIS — M419 Scoliosis, unspecified: Secondary | ICD-10-CM

## 2023-11-16 DIAGNOSIS — M5416 Radiculopathy, lumbar region: Secondary | ICD-10-CM

## 2023-11-16 NOTE — Therapy (Signed)
 OUTPATIENT PHYSICAL THERAPY THORACOLUMBAR TREATMENT   Patient Name: Kathryn [name] MRN: [id number] DOB:October 14, 1955, 68 y.o., female Today's Date: 11/16/2023  END OF SESSION:  PT End of Session - 11/16/23 0726     Visit Number 4    Number of Visits 8    Date for PT Re-Evaluation 12/01/23    Authorization Type [employer] (no auth)    PT Start Time 0721    PT Stop Time 0800    PT Time Calculation (min) 39 min    Activity Tolerance Patient tolerated treatment well    Behavior During Therapy [facility] for tasks assessed/performed              Past Medical History:  Diagnosis Date   Adrenal insufficiency (HCC)    occured following steroid shoulder injections ~ spring 2017   Anxiety    pt. reports that she has RLS- takes Xanax for calming her self so she can settle & sleep    Arthritis    shoulder & back, knees & leg     Asthma    aleery related   Atypical mole 11/12/2020   mod-severe- mid back   Chest pain, atypical    had cardiac workup - deemed related to anxiety   Chronic bronchitis (HCC)    "less since I quit smoking" (02/21/2016)   Chronic lower back pain    Dyspnea    related to spray paint that is put in  beer cans at her place of employment.  Pt. also reports that she has anxiety also that leads to SOB., Pt. reports her Mother just died a week ago     GERD (gastroesophageal reflux disease)    Guillain-Barre syndrome following vaccination (HCC) 1970's   post swine flu shot, hosp. for treatment for 1 month    H/O dizziness    H/O hiatal hernia    Hepatitis    hepatitis as a child; "got it from a little girl at school" make sick and vomiting   Herpes simplex    History of Guillain-Barre syndrome ~ 1978   "from the swine flu shot"   IBS (irritable bowel syndrome)    Pneumonia    PONV (postoperative nausea and vomiting)    Restless leg syndrome    Walking pneumonia ~ 2011   Past Surgical History:  Procedure Laterality Date   50 HOUR PH STUDY N/A  09/23/2016   Procedure: 24 HOUR PH STUDY;  Surgeon: Napoleon Form, MD;  Location: [facility];  Service: Endoscopy;  Laterality: N/A;   ANTERIOR CERVICAL DECOMP/DISCECTOMY FUSION  2000 & 2015   two times ago- C5 and C6, Dr. Clydene Fake, MD 2015   APPENDECTOMY  1980s   BACK SURGERY     BIOPSY  03/24/2022   Procedure: BIOPSY;  Surgeon: Dolores Frame, MD;  [facility]: [facility];  Service: Gastroenterology;;   BREAST BIOPSY Right    benign   CHOLECYSTECTOMY OPEN  1980s   COLONOSCOPY N/A 09/20/2014   rehman: Prep was excellent except she had thick layer of stool coating cecal and ascending colon mucosa. Cecal landmarks were well identified after vigorous washing. Small lymphoid polyp ablated via cold biopsy from appendiceal stump.Mucosa of rest of the colon was normal. Normal mucosa of rectum and anorectal junction.   ESOPHAGEAL DILATION N/A 05/29/2019   Procedure: ESOPHAGEAL DILATION;  Surgeon: Malissa Hippo, MD;  [facility];  Service: Endoscopy;  Laterality: N/A;   ESOPHAGEAL MANOMETRY N/A 09/23/2016  Procedure: ESOPHAGEAL MANOMETRY (EM);  Surgeon: Napoleon Form, MD;  [facility]: [facility];  Service: Endoscopy;  Laterality: N/A;   ESOPHAGOGASTRODUODENOSCOPY N/A 09/20/2014   Procedure: ESOPHAGOGASTRODUODENOSCOPY (EGD);  Surgeon: Malissa Hippo, MD;  [facility];  Service: Endoscopy;  Laterality: N/A;   ESOPHAGOGASTRODUODENOSCOPY (EGD) WITH PROPOFOL N/A 05/29/2019   - Z-line, 38 cm from the incisors.   ESOPHAGOGASTRODUODENOSCOPY (EGD) WITH PROPOFOL N/A 03/24/2022   Procedure: ESOPHAGOGASTRODUODENOSCOPY (EGD) WITH PROPOFOL;  Surgeon: Dolores Frame, MD;  [facility];  Service: Gastroenterology;  Laterality: N/A;  730 ASA 1   HIATAL HERNIA REPAIR N/A 11/27/2016   Procedure: LAPAROSCOPIC REPAIR OF HIATAL HERNIA WITH NISSEN FUNDOPLICATION;  Surgeon: Avel Peace, MD;  Location: [facility];  Service: General;  Laterality:  N/A;   INCISION AND DRAINAGE OF WOUND Left 02/21/2016   forearm, minor   IR ANGIO INTRA EXTRACRAN SEL INTERNAL CAROTID BILAT MOD SED  11/03/2022   IR ANGIO VERTEBRAL SEL VERTEBRAL UNI L MOD SED  11/03/2022   LUMBAR LAMINECTOMY/DECOMPRESSION MICRODISCECTOMY Right 06/22/2013   Procedure: RIGHT LUMBAR TWO-THREE DISCECTOMY;  Surgeon: Clydene Fake, MD;  Location: [facility];  Service: Neurosurgery;  Laterality: Right;  Right    MALONEY DILATION N/A 09/20/2014   Procedure: Elease Hashimoto DILATION;  Surgeon: Malissa Hippo, MD;  [facility];  Service: Endoscopy;  Laterality: N/A;   MINOR IRRIGATION AND DEBRIDEMENT OF WOUND Left 02/21/2016   Procedure: MINOR IRRIGATION AND DEBRIDEMENT/REPAIR OF LEFT ARM WOUND;  Surgeon: Beverely Low, MD;  Location: [facility];  Service: Orthopedics;  Laterality: Left;   REVERSE SHOULDER ARTHROPLASTY Right 02/21/2016   Procedure: RIGHT REVERSE TOTAL SHOULDER ARTHROPLASTY;  Surgeon: Beverely Low, MD;  Location: [facility] OR;  Service: Orthopedics;  Laterality: Right;   REVERSE TOTAL SHOULDER ARTHROPLASTY Right 02/21/2016   SAVORY DILATION  03/24/2022   Procedure: SAVORY DILATION;  Surgeon: Marguerita Merles, Reuel Boom, MD;  [facility];  Service: Gastroenterology;;   SHOULDER ARTHROSCOPY W/ ROTATOR CUFF REPAIR Right 1990s   TOTAL SHOULDER REPLACEMENT     Right   VAGINAL HYSTERECTOMY     Patient Active Problem List   Diagnosis Date Noted   Elevated LFTs 08/24/2022   Depression 06/18/2022   TIA (transient ischemic attack) 06/17/2022   Chronic pain 06/17/2022   Hyperglycemia 06/17/2022   Anxiety 06/17/2022   Asthma, chronic 06/17/2022   Hypothyroidism 06/17/2022   Restless leg syndrome 06/17/2022   Nausea without vomiting 02/10/2022   Diarrhea 04/09/2021   Pain in right knee 11/23/2019   IBS (irritable bowel syndrome) 06/27/2019   Dysphagia 05/08/2019   Paresthesias 04/27/2019   Hiatal hernia with GERD without esophagitis 11/27/2016    Gastroesophageal reflux disease    Hiatal hernia    S/P shoulder replacement 02/21/2016   DYSPNEA 12/13/2009   CHEST PAIN, ATYPICAL 12/13/2009    PCP: Assunta Found MD  REFERRING PROVIDER: Bedelia Person, MD  REFERRING DIAG: M41.26 (ICD-10-CM) - Other idiopathic scoliosis, lumbar region  Rationale for Evaluation and Treatment: Rehabilitation  THERAPY DIAG:  Difficulty in walking, not elsewhere classified  Scoliosis of lumbar spine, unspecified scoliosis type  Radiculopathy, lumbar region  ONSET DATE: 3 years ago  SUBJECTIVE:  SUBJECTIVE STATEMENT: "I'm sore"; reports compliance with exercises and they do help her relax; hard time with right hamstring stretch.  Pain in the back is about the same with numbness in tingling goes all the way day to left foot.    EVAL: Arrives to the clinic with low back pain (see below). Patient reports that the legs are numb and weak. Condition started for around 3 years without apparent reason. Patient reports that she has had multiple episode of falls. Patient reports that she had back surgery in 2014 and the area above the surgery collapsed. Recently, patient was told by her MD that she will have surgery again but will need physical therapy first to strengthen her back  PERTINENT HISTORY:  Back surgery/fusion in 2014  PAIN:  Are you having pain? Yes: NPRS scale: 7/10 Pain location: low back on the L side, shoots to the L buttocks to the thigh and sometimes to the L calf Pain description: aching, constant Aggravating factors: sitting ~ 30 minutes, walking and standing ~ 30 minutes Relieving factors: Ibuprofen  PRECAUTIONS: None  RED FLAGS: None   WEIGHT BEARING RESTRICTIONS: No  FALLS:  Has patient fallen in last 6 months? Yes. Number of falls  1  LIVING ENVIRONMENT: Lives with: lives with their spouse Lives in: Mobile home Stairs: Yes: External: 4 steps; bilateral but cannot reach both Has following equipment at home: None  OCCUPATION: retired  PLOF: Independent and Independent with basic ADLs  PATIENT GOALS: "I hope it will help"  NEXT MD VISIT: March 2025  OBJECTIVE:  Note: Objective measures were completed at Evaluation unless otherwise noted.  DIAGNOSTIC FINDINGS:  06/22/23 MRI LUMBAR SPINE WITHOUT CONTRAST   TECHNIQUE: Multiplanar, multisequence MR imaging of the lumbar spine was performed. No intravenous contrast was administered.   COMPARISON:  Films lumbar spine 05/05/2023. MRI lumbar spine 12/21/2017.   FINDINGS: Segmentation:  Standard.   Alignment: As seen on the prior plain films, there is convex left lumbar scoliosis with the apex at L3.   Vertebrae: No fracture, evidence of discitis, or bone lesion. The patient is status post L4-5 discectomy and fusion as seen on the prior studies. There is degenerative endplate signal change eccentric to the right at T12-L1 and L1-2.   Conus medullaris and cauda equina: Conus extends to the T12-L1 level. Conus and cauda equina appear normal.   Paraspinal and other soft tissues: Left renal cyst is unchanged.   Disc levels:   T11-12: This level is incompletely imaged in the sagittal and axial planes. Shallow disc bulge is seen but no stenosis is identified.   T12-L1: Mild-to-moderate facet degenerative disease and a shallow disc bulge to the left. The central canal and right foramen are open. Mild left foraminal narrowing is present.   L1-2: There is loss of disc space height with a broad-based disc bulge and mild-to-moderate facet degenerative disease. Mild to moderate central canal stenosis appears unchanged. The foramina are open.   L2-3: Moderate bilateral facet arthropathy is worse on the right. There is a disc bulge with endplate spurring,  more prominent to the right. Moderate narrowing in the right subarticular recess and foramen appears unchanged. The left foramen is open.   L3-4: Disc bulge and endplate spur, ligamentum flavum thickening and facet arthropathy are seen. Moderate central canal stenosis appears worse than on the prior exam. Mild to moderate foraminal narrowing is greater on the right.   L4-5: Status post discectomy and fusion. No stenosis. Small postoperative seroma in the laminectomy  bed is unchanged.   L5-S1: Advanced bilateral facet degenerative disease is present no disc bulge or protrusion. No stenosis.   IMPRESSION: 1. Status post L4-5 discectomy and fusion without stenosis. 2. Spondylosis at L3-4 appears worse than on the prior exam. Moderate central canal stenosis is present. Mild to moderate foraminal narrowing is greater on the right. 3. No change in mild to moderate central canal stenosis at L1-2. 4. No change in moderate narrowing in the right subarticular recess and foramen at L2-3.  PATIENT SURVEYS:  Modified Oswestry 28/50 = 56%   COGNITION: Overall cognitive status: Within functional limits for tasks assessed     SENSATION: Light touch: Impaired l LE  MUSCLE LENGTH: Hamstrings: mild restriction on B Thomas test: slight restriction on B Piriformis test: slight restriction on B  POSTURE: rounded shoulders, forward head, and increased lumbar lordosis  PALPATION: No tenderness on major bony landmarks and muscle mass of the lumbar spine  LUMBAR ROM:   AROM eval  Flexion 50%  Extension 75%*  Right lateral flexion   Left lateral flexion   Right rotation 100%  Left rotation 50%   (Blank rows = not tested) *worsened radicular symptoms.   LOWER EXTREMITY ROM:     Active  Right eval Left eval  Hip flexion [facility]  Hip extension [facility]  Hip abduction [facility]  Hip adduction    Hip internal rotation    Hip external rotation    Knee flexion [facility] WFL  Knee extension  [facility]  Ankle dorsiflexion [facility] WFL  Ankle plantarflexion [facility] WFL  Ankle inversion    Ankle eversion     (Blank rows = not tested)  LOWER EXTREMITY MMT:    MMT Right eval Left eval  Hip flexion 4 4  Hip extension 3+ 3+  Hip abduction 4- 4-  Hip adduction    Hip internal rotation    Hip external rotation    Knee flexion 4+ 4+  Knee extension 4+ 4+  Ankle dorsiflexion 4+ 4+  Ankle plantarflexion 4+ 4+  Ankle inversion    Ankle eversion     (Blank rows = not tested)  LUMBAR SPECIAL TESTS:  Straight leg raise test: Positive, FABER test: Negative, and Double knee to chest relieved radicular symptoms  FUNCTIONAL TESTS:  5 times sit to stand: 11.90 sec 2 minute walk test: 566 ft with pain = 6/10  GAIT: Distance walked: 566 ft Assistive device utilized: None Level of assistance: Complete Independence Comments: done during  TREATMENT DATE:  11/16/23: Nustep seat 8 x 5' dynamic warm up  Seated: Hamstring stretch 30" x 3 each Supine: Manual distraction left leg 10" hold x 5 Decompression exercises, head press, shoulder press, leg press and leg lengthener Horizontal Shoulder abduction with RTB 2 x 10 Bow and arrow x 8 each RTB Sidelying clam RTB 2 x 8  11/11/23: Supine:  Decompression 2-5 5x 5" Deep breathing with 1 hand chest/1 on stomach x 3 min TrA activation paired with exhale x 2 min SLR with post pelvic tilt x 10 x 2 on each Bridging x 3" x 10 x 2 Hamstring stretch 3x 30" (hands behind knee)  Sidelying: Clam with RTB 10x 5"  11/09/23 Reviewed goals NuStep, seat 8, level 1, > 60 SPM, 5' Seated hamstring stretch x 30" x 3 Post pelvic tilts x 3" x 10 x 2 Double knee to chest x 15" x 5 Bridging x 3" x 10 x 2 SLR with post pelvic tilt  x 10 x 2 on each Seated abdominal press with physioball x 3" x 10 x 2  11/03/23 Evaluation and patient education done                                                                                                                                PATIENT EDUCATION:  Education details: Educated on the pathoanatomy of low back pain. Educated on the goals and course of rehab.  Person educated: Patient Education method: Explanation Education comprehension: verbalized understanding  HOME EXERCISE PROGRAM: Access Code: [email or web address] URL: [email or web address] Date: 11/09/2023 Prepared by: Krystal Clark  Exercises - Seated Hamstring Stretch  - 1-2 x daily - 5-7 x weekly - 3 reps - 30 hold - Supine Posterior Pelvic Tilt  - 1-2 x daily - 5-7 x weekly - 2 sets - 10 reps - 3 hold - Supine Double Knee to Chest  - 1-2 x daily - 5-7 x weekly - 5 reps - 15 hold - Supine Active Straight Leg Raise  - 1-2 x daily - 5-7 x weekly - 2 sets - 10 reps - Seated Abdominal Press into [employer]  - 1-2 x daily - 5-7 x weekly - 2 sets - 10 reps - 3 hold  11/11/23: Access Code: [email or web address] URL: [email or web address] Date: 11/11/2023 Prepared by: Becky Sax  Exercises -Decompression - Supine Bridge  - 1-2 x daily - 7 x weekly - 3 sets - 10 reps -clam with RTB ASSESSMENT:  CLINICAL IMPRESSION: Session focus with core and proximal strengthening and stretches for mobility. Progressed core strengthening with no issue.  Patient with relief with left leg distraction and discussed aquatic exercise with her with her stating she gets in the pool with her grandson over the summer.  Updated HEP.   Patient will benefit from continued skilled therapy services to address deficits and promote return to optimal function.       EVAL: Patient is a 68 y.o. female who was seen today for physical therapy evaluation and treatment for lumbar scoliosis. Patient's condition is further defined by difficulty with sitting, standing, and walking due to pain, weakness, and decreased soft tissue extensibility. Skilled PT is required to address the impairments and functional limitations listed below.   OBJECTIVE IMPAIRMENTS: decreased  activity tolerance, difficulty walking, decreased ROM, decreased strength, impaired flexibility, postural dysfunction, and pain.   ACTIVITY LIMITATIONS: carrying, lifting, bending, sitting, standing, squatting, and hygiene/grooming  PARTICIPATION LIMITATIONS: meal prep, cleaning, laundry, driving, shopping, community activity, and yard work  PERSONAL FACTORS: Time since onset of injury/illness/exacerbation and 1 comorbidity: hx of low back surgery  are also affecting patient's functional outcome.   REHAB POTENTIAL: Fair    CLINICAL DECISION MAKING: Stable/uncomplicated  EVALUATION COMPLEXITY: Low   GOALS: Goals reviewed with patient? Yes  SHORT TERM GOALS: Target date: 11/17/23  Pt will demonstrate indep in HEP to facilitate carry-over of skilled services and improve functional outcomes Goal status: INITIAL  LONG TERM  GOALS: Target date: 12/01/23  Pt will demonstrate a decrease in modified ODI score by 13-14% to demonstrate significant improvement in ADLs Baseline: 56% Goal status: INITIAL  2.  Pt will decrease 5TSTS by at least 3 seconds in order to demonstrate clinically significant improvement in LE strength  Baseline: 11.90 sec Goal status: INITIAL  3.  Pt will be able to do > or = to 566 ft with slight to mild pain (3-4/10) in order to demonstrate clinically significant improvement in community ambulation Baseline: 566 ft with 6/10 pain Goal status: INITIAL  4.  Pt will demonstrate increase in LE strength to 4-/5 to facilitate ease and safety in ambulation Baseline: 3+/5 Goal status: INITIAL  5.  Pt will be able to tolerate > 30 minutes of sitting with slight to mild pain (2-4/10) to facilitate ease in ADLs Baseline: see above Goal status: INITIAL  PLAN:  PT FREQUENCY: 2x/week  PT DURATION: 4 weeks  PLANNED INTERVENTIONS: [id number]- PT Re-evaluation, 97110-Therapeutic exercises, 97530- Therapeutic activity, 97112- Neuromuscular re-education, 97535- Self Care,  97140- Manual therapy, 97116- Gait training, Patient/Family education, Taping, Dry Needling, Cryotherapy, and Moist heat.  PLAN FOR NEXT SESSION: Continue POC and may progress as tolerated with emphasis on LE flexibility, core and hip strength; sees her MD at the end of the month.     8:06 AM, 11/16/23 Reina Wilton Small Shontay Wallner MPT [facility] [place] [phone]

## 2023-11-17 DIAGNOSIS — Z87891 Personal history of nicotine dependence: Secondary | ICD-10-CM | POA: Diagnosis not present

## 2023-11-17 DIAGNOSIS — E271 Primary adrenocortical insufficiency: Secondary | ICD-10-CM | POA: Diagnosis not present

## 2023-11-17 DIAGNOSIS — E663 Overweight: Secondary | ICD-10-CM | POA: Diagnosis not present

## 2023-11-17 DIAGNOSIS — E559 Vitamin D deficiency, unspecified: Secondary | ICD-10-CM | POA: Diagnosis not present

## 2023-11-17 DIAGNOSIS — E039 Hypothyroidism, unspecified: Secondary | ICD-10-CM | POA: Diagnosis not present

## 2023-11-17 DIAGNOSIS — Z6826 Body mass index (BMI) 26.0-26.9, adult: Secondary | ICD-10-CM | POA: Diagnosis not present

## 2023-11-17 DIAGNOSIS — Z0001 Encounter for general adult medical examination with abnormal findings: Secondary | ICD-10-CM | POA: Diagnosis not present

## 2023-11-17 DIAGNOSIS — E7849 Other hyperlipidemia: Secondary | ICD-10-CM | POA: Diagnosis not present

## 2023-11-17 DIAGNOSIS — F32A Depression, unspecified: Secondary | ICD-10-CM | POA: Diagnosis not present

## 2023-11-17 DIAGNOSIS — Z1331 Encounter for screening for depression: Secondary | ICD-10-CM | POA: Diagnosis not present

## 2023-11-17 DIAGNOSIS — E782 Mixed hyperlipidemia: Secondary | ICD-10-CM | POA: Diagnosis not present

## 2023-11-17 DIAGNOSIS — J302 Other seasonal allergic rhinitis: Secondary | ICD-10-CM | POA: Diagnosis not present

## 2023-11-17 DIAGNOSIS — G2581 Restless legs syndrome: Secondary | ICD-10-CM | POA: Diagnosis not present

## 2023-11-18 ENCOUNTER — Encounter (HOSPITAL_COMMUNITY): Payer: Self-pay

## 2023-11-18 ENCOUNTER — Ambulatory Visit (HOSPITAL_COMMUNITY): Payer: PPO

## 2023-11-18 DIAGNOSIS — R262 Difficulty in walking, not elsewhere classified: Secondary | ICD-10-CM

## 2023-11-18 DIAGNOSIS — M419 Scoliosis, unspecified: Secondary | ICD-10-CM

## 2023-11-18 DIAGNOSIS — M5416 Radiculopathy, lumbar region: Secondary | ICD-10-CM

## 2023-11-18 NOTE — Therapy (Signed)
 OUTPATIENT PHYSICAL THERAPY THORACOLUMBAR TREATMENT   Patient Name: Kathryn [name] MRN: [id number] DOB:29-Mar-1956, 67 y.o., female Today's Date: 11/18/2023  END OF SESSION:  PT End of Session - 11/18/23 0715     Visit Number 5    Number of Visits 8    Date for PT Re-Evaluation 12/01/23    Authorization Type [employer] (no auth)    PT Start Time 0718    PT Stop Time 0803    PT Time Calculation (min) 45 min    Activity Tolerance Patient tolerated treatment well    Behavior During Therapy [facility] for tasks assessed/performed              Past Medical History:  Diagnosis Date   Adrenal insufficiency (HCC)    occured following steroid shoulder injections ~ spring 2017   Anxiety    pt. reports that she has RLS- takes Xanax for calming her self so she can settle & sleep    Arthritis    shoulder & back, knees & leg     Asthma    aleery related   Atypical mole 11/12/2020   mod-severe- mid back   Chest pain, atypical    had cardiac workup - deemed related to anxiety   Chronic bronchitis (HCC)    "less since I quit smoking" (02/21/2016)   Chronic lower back pain    Dyspnea    related to spray paint that is put in  beer cans at her place of employment.  Pt. also reports that she has anxiety also that leads to SOB., Pt. reports her Mother just died a week ago     GERD (gastroesophageal reflux disease)    Guillain-Barre syndrome following vaccination (HCC) 1970's   post swine flu shot, hosp. for treatment for 1 month    H/O dizziness    H/O hiatal hernia    Hepatitis    hepatitis as a child; "got it from a little girl at school" make sick and vomiting   Herpes simplex    History of Guillain-Barre syndrome ~ 1978   "from the swine flu shot"   IBS (irritable bowel syndrome)    Pneumonia    PONV (postoperative nausea and vomiting)    Restless leg syndrome    Walking pneumonia ~ 2011   Past Surgical History:  Procedure Laterality Date   54 HOUR PH STUDY N/A  09/23/2016   Procedure: 24 HOUR PH STUDY;  Surgeon: Napoleon Form, MD;  Location: [facility];  Service: Endoscopy;  Laterality: N/A;   ANTERIOR CERVICAL DECOMP/DISCECTOMY FUSION  2000 & 2015   two times ago- C5 and C6, Dr. Clydene Fake, MD 2015   APPENDECTOMY  1980s   BACK SURGERY     BIOPSY  03/24/2022   Procedure: BIOPSY;  Surgeon: Dolores Frame, MD;  [facility]: [facility];  Service: Gastroenterology;;   BREAST BIOPSY Right    benign   CHOLECYSTECTOMY OPEN  1980s   COLONOSCOPY N/A 09/20/2014   rehman: Prep was excellent except she had thick layer of stool coating cecal and ascending colon mucosa. Cecal landmarks were well identified after vigorous washing. Small lymphoid polyp ablated via cold biopsy from appendiceal stump.Mucosa of rest of the colon was normal. Normal mucosa of rectum and anorectal junction.   ESOPHAGEAL DILATION N/A 05/29/2019   Procedure: ESOPHAGEAL DILATION;  Surgeon: Malissa Hippo, MD;  [facility];  Service: Endoscopy;  Laterality: N/A;   ESOPHAGEAL MANOMETRY N/A 09/23/2016  Procedure: ESOPHAGEAL MANOMETRY (EM);  Surgeon: Napoleon Form, MD;  Location: [facility];  Service: Endoscopy;  Laterality: N/A;   ESOPHAGOGASTRODUODENOSCOPY N/A 09/20/2014   Procedure: ESOPHAGOGASTRODUODENOSCOPY (EGD);  Surgeon: Malissa Hippo, MD;  [facility];  Service: Endoscopy;  Laterality: N/A;   ESOPHAGOGASTRODUODENOSCOPY (EGD) WITH PROPOFOL N/A 05/29/2019   - Z-line, 38 cm from the incisors.   ESOPHAGOGASTRODUODENOSCOPY (EGD) WITH PROPOFOL N/A 03/24/2022   Procedure: ESOPHAGOGASTRODUODENOSCOPY (EGD) WITH PROPOFOL;  Surgeon: Dolores Frame, MD;  [facility];  Service: Gastroenterology;  Laterality: N/A;  730 ASA 1   HIATAL HERNIA REPAIR N/A 11/27/2016   Procedure: LAPAROSCOPIC REPAIR OF HIATAL HERNIA WITH NISSEN FUNDOPLICATION;  Surgeon: Avel Peace, MD;  Location: [facility];  Service: General;  Laterality:  N/A;   INCISION AND DRAINAGE OF WOUND Left 02/21/2016   forearm, minor   IR ANGIO INTRA EXTRACRAN SEL INTERNAL CAROTID BILAT MOD SED  11/03/2022   IR ANGIO VERTEBRAL SEL VERTEBRAL UNI L MOD SED  11/03/2022   LUMBAR LAMINECTOMY/DECOMPRESSION MICRODISCECTOMY Right 06/22/2013   Procedure: RIGHT LUMBAR TWO-THREE DISCECTOMY;  Surgeon: Clydene Fake, MD;  Location: [facility];  Service: Neurosurgery;  Laterality: Right;  Right    MALONEY DILATION N/A 09/20/2014   Procedure: Elease Hashimoto DILATION;  Surgeon: Malissa Hippo, MD;  [facility];  Service: Endoscopy;  Laterality: N/A;   MINOR IRRIGATION AND DEBRIDEMENT OF WOUND Left 02/21/2016   Procedure: MINOR IRRIGATION AND DEBRIDEMENT/REPAIR OF LEFT ARM WOUND;  Surgeon: Beverely Low, MD;  Location: [facility];  Service: Orthopedics;  Laterality: Left;   REVERSE SHOULDER ARTHROPLASTY Right 02/21/2016   Procedure: RIGHT REVERSE TOTAL SHOULDER ARTHROPLASTY;  Surgeon: Beverely Low, MD;  Location: [facility] OR;  Service: Orthopedics;  Laterality: Right;   REVERSE TOTAL SHOULDER ARTHROPLASTY Right 02/21/2016   SAVORY DILATION  03/24/2022   Procedure: SAVORY DILATION;  Surgeon: Marguerita Merles, Reuel Boom, MD;  [facility];  Service: Gastroenterology;;   SHOULDER ARTHROSCOPY W/ ROTATOR CUFF REPAIR Right 1990s   TOTAL SHOULDER REPLACEMENT     Right   VAGINAL HYSTERECTOMY     Patient Active Problem List   Diagnosis Date Noted   Elevated LFTs 08/24/2022   Depression 06/18/2022   TIA (transient ischemic attack) 06/17/2022   Chronic pain 06/17/2022   Hyperglycemia 06/17/2022   Anxiety 06/17/2022   Asthma, chronic 06/17/2022   Hypothyroidism 06/17/2022   Restless leg syndrome 06/17/2022   Nausea without vomiting 02/10/2022   Diarrhea 04/09/2021   Pain in right knee 11/23/2019   IBS (irritable bowel syndrome) 06/27/2019   Dysphagia 05/08/2019   Paresthesias 04/27/2019   Hiatal hernia with GERD without esophagitis 11/27/2016    Gastroesophageal reflux disease    Hiatal hernia    S/P shoulder replacement 02/21/2016   DYSPNEA 12/13/2009   CHEST PAIN, ATYPICAL 12/13/2009    PCP: Assunta Found MD  REFERRING PROVIDER: Bedelia Person, MD  REFERRING DIAG: M41.26 (ICD-10-CM) - Other idiopathic scoliosis, lumbar region  Rationale for Evaluation and Treatment: Rehabilitation  THERAPY DIAG:  Difficulty in walking, not elsewhere classified  Scoliosis of lumbar spine, unspecified scoliosis type  Radiculopathy, lumbar region  ONSET DATE: 3 years ago  SUBJECTIVE:  SUBJECTIVE STATEMENT: Reports intense pain yesterday with radicular symptoms started at buttock then moves to lateral LE to Lt ankle, pain scale 8/10.  Stated she picked up grandson and stood for him to play in playground, increase pain seated at church yesterday.    EVAL: Arrives to the clinic with low back pain (see below). Patient reports that the legs are numb and weak. Condition started for around 3 years without apparent reason. Patient reports that she has had multiple episode of falls. Patient reports that she had back surgery in 2014 and the area above the surgery collapsed. Recently, patient was told by her MD that she will have surgery again but will need physical therapy first to strengthen her back  PERTINENT HISTORY:  Back surgery/fusion in 2014  PAIN:  Are you having pain? Yes: NPRS scale: 7/10 Pain location: low back on the L side, shoots to the L buttocks to the thigh and sometimes to the L calf Pain description: aching, constant Aggravating factors: sitting ~ 30 minutes, walking and standing ~ 30 minutes Relieving factors: Ibuprofen  PRECAUTIONS: None  RED FLAGS: None   WEIGHT BEARING RESTRICTIONS: No  FALLS:  Has patient fallen in last 6  months? Yes. Number of falls 1  LIVING ENVIRONMENT: Lives with: lives with their spouse Lives in: Mobile home Stairs: Yes: External: 4 steps; bilateral but cannot reach both Has following equipment at home: None  OCCUPATION: retired  PLOF: Independent and Independent with basic ADLs  PATIENT GOALS: "I hope it will help"  NEXT MD VISIT: March 2025  OBJECTIVE:  Note: Objective measures were completed at Evaluation unless otherwise noted.  DIAGNOSTIC FINDINGS:  06/22/23 MRI LUMBAR SPINE WITHOUT CONTRAST   TECHNIQUE: Multiplanar, multisequence MR imaging of the lumbar spine was performed. No intravenous contrast was administered.   COMPARISON:  Films lumbar spine 05/05/2023. MRI lumbar spine 12/21/2017.   FINDINGS: Segmentation:  Standard.   Alignment: As seen on the prior plain films, there is convex left lumbar scoliosis with the apex at L3.   Vertebrae: No fracture, evidence of discitis, or bone lesion. The patient is status post L4-5 discectomy and fusion as seen on the prior studies. There is degenerative endplate signal change eccentric to the right at T12-L1 and L1-2.   Conus medullaris and cauda equina: Conus extends to the T12-L1 level. Conus and cauda equina appear normal.   Paraspinal and other soft tissues: Left renal cyst is unchanged.   Disc levels:   T11-12: This level is incompletely imaged in the sagittal and axial planes. Shallow disc bulge is seen but no stenosis is identified.   T12-L1: Mild-to-moderate facet degenerative disease and a shallow disc bulge to the left. The central canal and right foramen are open. Mild left foraminal narrowing is present.   L1-2: There is loss of disc space height with a broad-based disc bulge and mild-to-moderate facet degenerative disease. Mild to moderate central canal stenosis appears unchanged. The foramina are open.   L2-3: Moderate bilateral facet arthropathy is worse on the right. There is a disc  bulge with endplate spurring, more prominent to the right. Moderate narrowing in the right subarticular recess and foramen appears unchanged. The left foramen is open.   L3-4: Disc bulge and endplate spur, ligamentum flavum thickening and facet arthropathy are seen. Moderate central canal stenosis appears worse than on the prior exam. Mild to moderate foraminal narrowing is greater on the right.   L4-5: Status post discectomy and fusion. No stenosis. Small postoperative seroma in  the laminectomy bed is unchanged.   L5-S1: Advanced bilateral facet degenerative disease is present no disc bulge or protrusion. No stenosis.   IMPRESSION: 1. Status post L4-5 discectomy and fusion without stenosis. 2. Spondylosis at L3-4 appears worse than on the prior exam. Moderate central canal stenosis is present. Mild to moderate foraminal narrowing is greater on the right. 3. No change in mild to moderate central canal stenosis at L1-2. 4. No change in moderate narrowing in the right subarticular recess and foramen at L2-3.  PATIENT SURVEYS:  Modified Oswestry 28/50 = 56%   COGNITION: Overall cognitive status: Within functional limits for tasks assessed     SENSATION: Light touch: Impaired l LE  MUSCLE LENGTH: Hamstrings: mild restriction on B Thomas test: slight restriction on B Piriformis test: slight restriction on B  POSTURE: rounded shoulders, forward head, and increased lumbar lordosis  PALPATION: No tenderness on major bony landmarks and muscle mass of the lumbar spine  LUMBAR ROM:   AROM eval  Flexion 50%  Extension 75%*  Right lateral flexion   Left lateral flexion   Right rotation 100%  Left rotation 50%   (Blank rows = not tested) *worsened radicular symptoms.   LOWER EXTREMITY ROM:     Active  Right eval Left eval  Hip flexion [facility]  Hip extension [facility]  Hip abduction [facility]  Hip adduction    Hip internal rotation    Hip external rotation    Knee  flexion [facility] WFL  Knee extension [facility]  Ankle dorsiflexion [facility] WFL  Ankle plantarflexion [facility] WFL  Ankle inversion    Ankle eversion     (Blank rows = not tested)  LOWER EXTREMITY MMT:    MMT Right eval Left eval  Hip flexion 4 4  Hip extension 3+ 3+  Hip abduction 4- 4-  Hip adduction    Hip internal rotation    Hip external rotation    Knee flexion 4+ 4+  Knee extension 4+ 4+  Ankle dorsiflexion 4+ 4+  Ankle plantarflexion 4+ 4+  Ankle inversion    Ankle eversion     (Blank rows = not tested)  LUMBAR SPECIAL TESTS:  Straight leg raise test: Positive, FABER test: Negative, and Double knee to chest relieved radicular symptoms  FUNCTIONAL TESTS:  5 times sit to stand: 11.90 sec 2 minute walk test: 566 ft with pain = 6/10  GAIT: Distance walked: 566 ft Assistive device utilized: None Level of assistance: Complete Independence Comments: done during  TREATMENT DATE:  11/18/23:   Nustep seat 8 x 5' dynamic warm up Seated posture with additional lumbar support Sidelying femoral nerve glide 10x  Supine: Decompression with RTB 1-4 10x each Bridge with RTB around thigh 15x 5" Hamstring stretch with rope 3x 30"  11/16/23: Nustep seat 8 x 5' dynamic warm up  Seated: Hamstring stretch 30" x 3 each Supine: Manual distraction left leg 10" hold x 5 Decompression exercises, head press, shoulder press, leg press and leg lengthener Horizontal Shoulder abduction with RTB 2 x 10 Bow and arrow x 8 each RTB Sidelying clam RTB 2 x 8  11/11/23: Supine:  Decompression 2-5 5x 5" Deep breathing with 1 hand chest/1 on stomach x 3 min TrA activation paired with exhale x 2 min SLR with post pelvic tilt x 10 x 2 on each Bridging x 3" x 10 x 2 Hamstring stretch 3x 30" (hands behind knee)  Sidelying: Clam with RTB 10x 5"  11/09/23 Reviewed  goals NuStep, seat 8, level 1, > 60 SPM, 5' Seated hamstring stretch x 30" x 3 Post pelvic tilts x 3" x 10 x 2 Double knee to chest x  15" x 5 Bridging x 3" x 10 x 2 SLR with post pelvic tilt x 10 x 2 on each Seated abdominal press with physioball x 3" x 10 x 2  11/03/23 Evaluation and patient education done                                                                                                                               PATIENT EDUCATION:  Education details: Educated on the pathoanatomy of low back pain. Educated on the goals and course of rehab.  Person educated: Patient Education method: Explanation Education comprehension: verbalized understanding  HOME EXERCISE PROGRAM: Access Code: [email or web address] URL: [email or web address] Date: 11/09/2023 Prepared by: Krystal Clark  Exercises - Seated Hamstring Stretch  - 1-2 x daily - 5-7 x weekly - 3 reps - 30 hold - Supine Posterior Pelvic Tilt  - 1-2 x daily - 5-7 x weekly - 2 sets - 10 reps - 3 hold - Supine Double Knee to Chest  - 1-2 x daily - 5-7 x weekly - 5 reps - 15 hold - Supine Active Straight Leg Raise  - 1-2 x daily - 5-7 x weekly - 2 sets - 10 reps - Seated Abdominal Press into [employer]  - 1-2 x daily - 5-7 x weekly - 2 sets - 10 reps - 3 hold  11/11/23: Access Code: [email or web address] URL: [email or web address] Date: 11/11/2023 Prepared by: Becky Sax  Exercises -Decompression - Supine Bridge  - 1-2 x daily - 7 x weekly - 3 sets - 10 reps -clam with RTB  11/18/23:- Sidelying Femoral Nerve Glide - Top Leg  - 1 x daily - 7 x weekly - 3 sets - 10 reps  Decompression with RTB   Supine hamstring stretch with rope 3x 30"  ASSESSMENT:  CLINICAL IMPRESSION: Began session educating importance of seated posture for pain control with additional lumbar support.  Added femoral nerve glides to address radicular symptoms with reports of symptoms resolved following, added to HEP.  Pt shown how to view HEP videos on Medbridge app to assure correct form and mechanics.  Added theraband with decompression exercise for postural  strengthening, noted limited ER with RT shoulder.  EOS reports pain reduce to LBP 4/10 (was 8/10 at beginning of session) with no radicular symptoms.   EVAL: Patient is a 68 y.o. female who was seen today for physical therapy evaluation and treatment for lumbar scoliosis. Patient's condition is further defined by difficulty with sitting, standing, and walking due to pain, weakness, and decreased soft tissue extensibility. Skilled PT is required to address the impairments and functional limitations listed below.   OBJECTIVE IMPAIRMENTS: decreased activity tolerance, difficulty walking, decreased ROM, decreased strength, impaired flexibility, postural dysfunction, and pain.   ACTIVITY LIMITATIONS: carrying, lifting, bending, sitting,  standing, squatting, and hygiene/grooming  PARTICIPATION LIMITATIONS: meal prep, cleaning, laundry, driving, shopping, community activity, and yard work  PERSONAL FACTORS: Time since onset of injury/illness/exacerbation and 1 comorbidity: hx of low back surgery  are also affecting patient's functional outcome.   REHAB POTENTIAL: Fair    CLINICAL DECISION MAKING: Stable/uncomplicated  EVALUATION COMPLEXITY: Low   GOALS: Goals reviewed with patient? Yes  SHORT TERM GOALS: Target date: 11/17/23  Pt will demonstrate indep in HEP to facilitate carry-over of skilled services and improve functional outcomes Goal status: INITIAL  LONG TERM GOALS: Target date: 12/01/23  Pt will demonstrate a decrease in modified ODI score by 13-14% to demonstrate significant improvement in ADLs Baseline: 56% Goal status: INITIAL  2.  Pt will decrease 5TSTS by at least 3 seconds in order to demonstrate clinically significant improvement in LE strength  Baseline: 11.90 sec Goal status: INITIAL  3.  Pt will be able to do > or = to 566 ft with slight to mild pain (3-4/10) in order to demonstrate clinically significant improvement in community ambulation Baseline: 566 ft with  6/10 pain Goal status: INITIAL  4.  Pt will demonstrate increase in LE strength to 4-/5 to facilitate ease and safety in ambulation Baseline: 3+/5 Goal status: INITIAL  5.  Pt will be able to tolerate > 30 minutes of sitting with slight to mild pain (2-4/10) to facilitate ease in ADLs Baseline: see above Goal status: INITIAL  PLAN:  PT FREQUENCY: 2x/week  PT DURATION: 4 weeks  PLANNED INTERVENTIONS: [id number]- PT Re-evaluation, 97110-Therapeutic exercises, 97530- Therapeutic activity, 97112- Neuromuscular re-education, 97535- Self Care, [id number]- Manual therapy, 97116- Gait training, Patient/Family education, Taping, Dry Needling, Cryotherapy, and Moist heat.  PLAN FOR NEXT SESSION: Continue POC and may progress as tolerated with emphasis on LE flexibility, core and hip strength; sees her MD at the end of the month.    Becky Sax, LPTA/CLT; CBIS [phone]  8:29 AM, 11/18/23

## 2023-11-19 DIAGNOSIS — J301 Allergic rhinitis due to pollen: Secondary | ICD-10-CM | POA: Diagnosis not present

## 2023-11-19 DIAGNOSIS — J3081 Allergic rhinitis due to animal (cat) (dog) hair and dander: Secondary | ICD-10-CM | POA: Diagnosis not present

## 2023-11-19 DIAGNOSIS — J3089 Other allergic rhinitis: Secondary | ICD-10-CM | POA: Diagnosis not present

## 2023-11-22 ENCOUNTER — Ambulatory Visit (INDEPENDENT_AMBULATORY_CARE_PROVIDER_SITE_OTHER): Payer: Medicare HMO | Admitting: Gastroenterology

## 2023-11-23 ENCOUNTER — Ambulatory Visit (HOSPITAL_COMMUNITY): Payer: PPO

## 2023-11-23 ENCOUNTER — Encounter (HOSPITAL_COMMUNITY): Payer: Self-pay

## 2023-11-23 DIAGNOSIS — R262 Difficulty in walking, not elsewhere classified: Secondary | ICD-10-CM | POA: Diagnosis not present

## 2023-11-23 DIAGNOSIS — M419 Scoliosis, unspecified: Secondary | ICD-10-CM

## 2023-11-23 DIAGNOSIS — M5416 Radiculopathy, lumbar region: Secondary | ICD-10-CM

## 2023-11-23 NOTE — Therapy (Signed)
 OUTPATIENT PHYSICAL THERAPY THORACOLUMBAR TREATMENT   Patient Name: Kathryn [name] MRN: [id number] DOB:March 25, 1956, 68 y.o., female Today's Date: 11/23/2023  END OF SESSION:  PT End of Session - 11/23/23 0716     Visit Number 6    Number of Visits 8    Date for PT Re-Evaluation 12/01/23    Authorization Type [employer] (no auth)    PT Start Time 0717    PT Stop Time 0758    PT Time Calculation (min) 41 min    Activity Tolerance Patient tolerated treatment well    Behavior During Therapy [facility] for tasks assessed/performed              Past Medical History:  Diagnosis Date   Adrenal insufficiency (HCC)    occured following steroid shoulder injections ~ spring 2017   Anxiety    pt. reports that she has RLS- takes Xanax for calming her self so she can settle & sleep    Arthritis    shoulder & back, knees & leg     Asthma    aleery related   Atypical mole 11/12/2020   mod-severe- mid back   Chest pain, atypical    had cardiac workup - deemed related to anxiety   Chronic bronchitis (HCC)    "less since I quit smoking" (02/21/2016)   Chronic lower back pain    Dyspnea    related to spray paint that is put in  beer cans at her place of employment.  Pt. also reports that she has anxiety also that leads to SOB., Pt. reports her Mother just died a week ago     GERD (gastroesophageal reflux disease)    Guillain-Barre syndrome following vaccination (HCC) 1970's   post swine flu shot, hosp. for treatment for 1 month    H/O dizziness    H/O hiatal hernia    Hepatitis    hepatitis as a child; "got it from a little girl at school" make sick and vomiting   Herpes simplex    History of Guillain-Barre syndrome ~ 1978   "from the swine flu shot"   IBS (irritable bowel syndrome)    Pneumonia    PONV (postoperative nausea and vomiting)    Restless leg syndrome    Walking pneumonia ~ 2011   Past Surgical History:  Procedure Laterality Date   34 HOUR PH STUDY N/A  09/23/2016   Procedure: 24 HOUR PH STUDY;  Surgeon: Napoleon Form, MD;  Location: [facility];  Service: Endoscopy;  Laterality: N/A;   ANTERIOR CERVICAL DECOMP/DISCECTOMY FUSION  2000 & 2015   two times ago- C5 and C6, Dr. Clydene Fake, MD 2015   APPENDECTOMY  1980s   BACK SURGERY     BIOPSY  03/24/2022   Procedure: BIOPSY;  Surgeon: Dolores Frame, MD;  [facility]: [facility];  Service: Gastroenterology;;   BREAST BIOPSY Right    benign   CHOLECYSTECTOMY OPEN  1980s   COLONOSCOPY N/A 09/20/2014   rehman: Prep was excellent except she had thick layer of stool coating cecal and ascending colon mucosa. Cecal landmarks were well identified after vigorous washing. Small lymphoid polyp ablated via cold biopsy from appendiceal stump.Mucosa of rest of the colon was normal. Normal mucosa of rectum and anorectal junction.   ESOPHAGEAL DILATION N/A 05/29/2019   Procedure: ESOPHAGEAL DILATION;  Surgeon: Malissa Hippo, MD;  [facility];  Service: Endoscopy;  Laterality: N/A;   ESOPHAGEAL MANOMETRY N/A 09/23/2016  Procedure: ESOPHAGEAL MANOMETRY (EM);  Surgeon: Napoleon Form, MD;  Location: [facility];  Service: Endoscopy;  Laterality: N/A;   ESOPHAGOGASTRODUODENOSCOPY N/A 09/20/2014   Procedure: ESOPHAGOGASTRODUODENOSCOPY (EGD);  Surgeon: Malissa Hippo, MD;  [facility];  Service: Endoscopy;  Laterality: N/A;   ESOPHAGOGASTRODUODENOSCOPY (EGD) WITH PROPOFOL N/A 05/29/2019   - Z-line, 38 cm from the incisors.   ESOPHAGOGASTRODUODENOSCOPY (EGD) WITH PROPOFOL N/A 03/24/2022   Procedure: ESOPHAGOGASTRODUODENOSCOPY (EGD) WITH PROPOFOL;  Surgeon: Dolores Frame, MD;  [facility];  Service: Gastroenterology;  Laterality: N/A;  730 ASA 1   HIATAL HERNIA REPAIR N/A 11/27/2016   Procedure: LAPAROSCOPIC REPAIR OF HIATAL HERNIA WITH NISSEN FUNDOPLICATION;  Surgeon: Avel Peace, MD;  Location: [facility];  Service: General;  Laterality:  N/A;   INCISION AND DRAINAGE OF WOUND Left 02/21/2016   forearm, minor   IR ANGIO INTRA EXTRACRAN SEL INTERNAL CAROTID BILAT MOD SED  11/03/2022   IR ANGIO VERTEBRAL SEL VERTEBRAL UNI L MOD SED  11/03/2022   LUMBAR LAMINECTOMY/DECOMPRESSION MICRODISCECTOMY Right 06/22/2013   Procedure: RIGHT LUMBAR TWO-THREE DISCECTOMY;  Surgeon: Clydene Fake, MD;  Location: [facility];  Service: Neurosurgery;  Laterality: Right;  Right    MALONEY DILATION N/A 09/20/2014   Procedure: Elease Hashimoto DILATION;  Surgeon: Malissa Hippo, MD;  [facility];  Service: Endoscopy;  Laterality: N/A;   MINOR IRRIGATION AND DEBRIDEMENT OF WOUND Left 02/21/2016   Procedure: MINOR IRRIGATION AND DEBRIDEMENT/REPAIR OF LEFT ARM WOUND;  Surgeon: Beverely Low, MD;  Location: [facility];  Service: Orthopedics;  Laterality: Left;   REVERSE SHOULDER ARTHROPLASTY Right 02/21/2016   Procedure: RIGHT REVERSE TOTAL SHOULDER ARTHROPLASTY;  Surgeon: Beverely Low, MD;  [facility];  Service: Orthopedics;  Laterality: Right;   REVERSE TOTAL SHOULDER ARTHROPLASTY Right 02/21/2016   SAVORY DILATION  03/24/2022   Procedure: SAVORY DILATION;  Surgeon: Marguerita Merles, Reuel Boom, MD;  [facility];  Service: Gastroenterology;;   SHOULDER ARTHROSCOPY W/ ROTATOR CUFF REPAIR Right 1990s   TOTAL SHOULDER REPLACEMENT     Right   VAGINAL HYSTERECTOMY     Patient Active Problem List   Diagnosis Date Noted   Elevated LFTs 08/24/2022   Depression 06/18/2022   TIA (transient ischemic attack) 06/17/2022   Chronic pain 06/17/2022   Hyperglycemia 06/17/2022   Anxiety 06/17/2022   Asthma, chronic 06/17/2022   Hypothyroidism 06/17/2022   Restless leg syndrome 06/17/2022   Nausea without vomiting 02/10/2022   Diarrhea 04/09/2021   Pain in right knee 11/23/2019   IBS (irritable bowel syndrome) 06/27/2019   Dysphagia 05/08/2019   Paresthesias 04/27/2019   Hiatal hernia with GERD without esophagitis 11/27/2016    Gastroesophageal reflux disease    Hiatal hernia    S/P shoulder replacement 02/21/2016   DYSPNEA 12/13/2009   CHEST PAIN, ATYPICAL 12/13/2009    PCP: Assunta Found MD  REFERRING PROVIDER: Bedelia Person, MD  REFERRING DIAG: M41.26 (ICD-10-CM) - Other idiopathic scoliosis, lumbar region  Rationale for Evaluation and Treatment: Rehabilitation  THERAPY DIAG:  Difficulty in walking, not elsewhere classified  Scoliosis of lumbar spine, unspecified scoliosis type  Radiculopathy, lumbar region  ONSET DATE: 3 years ago  SUBJECTIVE:  SUBJECTIVE STATEMENT: Reports the nerve glide helped though the radicular symptoms come and go more when sitting or standing too long for any period of time.  Stated she had forgotten about the glide so hasn't outside of therapy last session.  Current pain scale 7/10 LBP with radicular symptoms in Lt buttocks.  EVAL: Arrives to the clinic with low back pain (see below). Patient reports that the legs are numb and weak. Condition started for around 3 years without apparent reason. Patient reports that she has had multiple episode of falls. Patient reports that she had back surgery in 2014 and the area above the surgery collapsed. Recently, patient was told by her MD that she will have surgery again but will need physical therapy first to strengthen her back  PERTINENT HISTORY:  Back surgery/fusion in 2014  PAIN:  Are you having pain? Yes: NPRS scale: 7/10 Pain location: low back on the L side, shoots to the L buttocks to the thigh and sometimes to the L calf Pain description: aching, constant Aggravating factors: sitting ~ 30 minutes, walking and standing ~ 30 minutes Relieving factors: Ibuprofen  PRECAUTIONS: None  RED FLAGS: None   WEIGHT BEARING RESTRICTIONS:  No  FALLS:  Has patient fallen in last 6 months? Yes. Number of falls 1  LIVING ENVIRONMENT: Lives with: lives with their spouse Lives in: Mobile home Stairs: Yes: External: 4 steps; bilateral but cannot reach both Has following equipment at home: None  OCCUPATION: retired  PLOF: Independent and Independent with basic ADLs  PATIENT GOALS: "I hope it will help"  NEXT MD VISIT: March 2025  OBJECTIVE:  Note: Objective measures were completed at Evaluation unless otherwise noted.  DIAGNOSTIC FINDINGS:  06/22/23 MRI LUMBAR SPINE WITHOUT CONTRAST   TECHNIQUE: Multiplanar, multisequence MR imaging of the lumbar spine was performed. No intravenous contrast was administered.   COMPARISON:  Films lumbar spine 05/05/2023. MRI lumbar spine 12/21/2017.   FINDINGS: Segmentation:  Standard.   Alignment: As seen on the prior plain films, there is convex left lumbar scoliosis with the apex at L3.   Vertebrae: No fracture, evidence of discitis, or bone lesion. The patient is status post L4-5 discectomy and fusion as seen on the prior studies. There is degenerative endplate signal change eccentric to the right at T12-L1 and L1-2.   Conus medullaris and cauda equina: Conus extends to the T12-L1 level. Conus and cauda equina appear normal.   Paraspinal and other soft tissues: Left renal cyst is unchanged.   Disc levels:   T11-12: This level is incompletely imaged in the sagittal and axial planes. Shallow disc bulge is seen but no stenosis is identified.   T12-L1: Mild-to-moderate facet degenerative disease and a shallow disc bulge to the left. The central canal and right foramen are open. Mild left foraminal narrowing is present.   L1-2: There is loss of disc space height with a broad-based disc bulge and mild-to-moderate facet degenerative disease. Mild to moderate central canal stenosis appears unchanged. The foramina are open.   L2-3: Moderate bilateral facet arthropathy  is worse on the right. There is a disc bulge with endplate spurring, more prominent to the right. Moderate narrowing in the right subarticular recess and foramen appears unchanged. The left foramen is open.   L3-4: Disc bulge and endplate spur, ligamentum flavum thickening and facet arthropathy are seen. Moderate central canal stenosis appears worse than on the prior exam. Mild to moderate foraminal narrowing is greater on the right.   L4-5: Status post discectomy  and fusion. No stenosis. Small postoperative seroma in the laminectomy bed is unchanged.   L5-S1: Advanced bilateral facet degenerative disease is present no disc bulge or protrusion. No stenosis.   IMPRESSION: 1. Status post L4-5 discectomy and fusion without stenosis. 2. Spondylosis at L3-4 appears worse than on the prior exam. Moderate central canal stenosis is present. Mild to moderate foraminal narrowing is greater on the right. 3. No change in mild to moderate central canal stenosis at L1-2. 4. No change in moderate narrowing in the right subarticular recess and foramen at L2-3.  PATIENT SURVEYS:  Modified Oswestry 28/50 = 56%   COGNITION: Overall cognitive status: Within functional limits for tasks assessed     SENSATION: Light touch: Impaired l LE  MUSCLE LENGTH: Hamstrings: mild restriction on B Thomas test: slight restriction on B Piriformis test: slight restriction on B  POSTURE: rounded shoulders, forward head, and increased lumbar lordosis  PALPATION: No tenderness on major bony landmarks and muscle mass of the lumbar spine  LUMBAR ROM:   AROM eval  Flexion 50%  Extension 75%*  Right lateral flexion   Left lateral flexion   Right rotation 100%  Left rotation 50%   (Blank rows = not tested) *worsened radicular symptoms.   LOWER EXTREMITY ROM:     Active  Right eval Left eval  Hip flexion [facility]  Hip extension [facility]  Hip abduction [facility]  Hip adduction    Hip internal rotation     Hip external rotation    Knee flexion [facility] WFL  Knee extension [facility]  Ankle dorsiflexion [facility] WFL  Ankle plantarflexion [facility] WFL  Ankle inversion    Ankle eversion     (Blank rows = not tested)  LOWER EXTREMITY MMT:    MMT Right eval Left eval  Hip flexion 4 4  Hip extension 3+ 3+  Hip abduction 4- 4-  Hip adduction    Hip internal rotation    Hip external rotation    Knee flexion 4+ 4+  Knee extension 4+ 4+  Ankle dorsiflexion 4+ 4+  Ankle plantarflexion 4+ 4+  Ankle inversion    Ankle eversion     (Blank rows = not tested)  LUMBAR SPECIAL TESTS:  Straight leg raise test: Positive, FABER test: Negative, and Double knee to chest relieved radicular symptoms  FUNCTIONAL TESTS:  5 times sit to stand: 11.90 sec 2 minute walk test: 566 ft with pain = 6/10  GAIT: Distance walked: 566 ft Assistive device utilized: None Level of assistance: Complete Independence Comments: done during  TREATMENT DATE:  11/23/23: [place] x 5' dynamic warm up Standing:  Squat front of chair 2x 10 cueing for eccentric control Marching 10x 5" Sidestep with RTB around thigh 3RT  Supine: Heel slide with feet on ball LTR with feet on ball 10x Bridge with RTB around thigh 10x Hamstring stretch with rope 3 x 30"  Sidelying: Femoral nerve glide 10x reviewed mechanics  11/18/23:   Nustep seat 8 x 5' dynamic warm up Seated posture with additional lumbar support Sidelying femoral nerve glide 10x  Supine: Decompression with RTB 1-4 10x each Bridge with RTB around thigh 15x 5" Hamstring stretch with rope 3x 30"  11/16/23: Nustep seat 8 x 5' dynamic warm up  Seated: Hamstring stretch 30" x 3 each Supine: Manual distraction left leg 10" hold x 5 Decompression exercises, head press, shoulder press, leg press and leg lengthener Horizontal Shoulder abduction with RTB 2 x 10 Bow  and arrow x 8 each RTB Sidelying clam RTB 2 x 8  11/11/23: Supine:  Decompression 2-5 5x  5" Deep breathing with 1 hand chest/1 on stomach x 3 min TrA activation paired with exhale x 2 min SLR with post pelvic tilt x 10 x 2 on each Bridging x 3" x 10 x 2 Hamstring stretch 3x 30" (hands behind knee)  Sidelying: Clam with RTB 10x 5"  11/09/23 Reviewed goals NuStep, seat 8, level 1, > 60 SPM, 5' Seated hamstring stretch x 30" x 3 Post pelvic tilts x 3" x 10 x 2 Double knee to chest x 15" x 5 Bridging x 3" x 10 x 2 SLR with post pelvic tilt x 10 x 2 on each Seated abdominal press with physioball x 3" x 10 x 2  11/03/23 Evaluation and patient education done                                                                                                                               PATIENT EDUCATION:  Education details: Educated on the pathoanatomy of low back pain. Educated on the goals and course of rehab.  Person educated: Patient Education method: Explanation Education comprehension: verbalized understanding  HOME EXERCISE PROGRAM: Access Code: [email or web address] URL: [email or web address] Date: 11/09/2023 Prepared by: Krystal Clark  Exercises - Seated Hamstring Stretch  - 1-2 x daily - 5-7 x weekly - 3 reps - 30 hold - Supine Posterior Pelvic Tilt  - 1-2 x daily - 5-7 x weekly - 2 sets - 10 reps - 3 hold - Supine Double Knee to Chest  - 1-2 x daily - 5-7 x weekly - 5 reps - 15 hold - Supine Active Straight Leg Raise  - 1-2 x daily - 5-7 x weekly - 2 sets - 10 reps - Seated Abdominal Press into [employer]  - 1-2 x daily - 5-7 x weekly - 2 sets - 10 reps - 3 hold  11/11/23: Access Code: [email or web address] URL: [email or web address] Date: 11/11/2023 Prepared by: Becky Sax  Exercises -Decompression - Supine Bridge  - 1-2 x daily - 7 x weekly - 3 sets - 10 reps -clam with RTB  11/18/23:- Sidelying Femoral Nerve Glide - Top Leg  - 1 x daily - 7 x weekly - 3 sets - 10 reps  Decompression with RTB   Supine hamstring stretch with rope 3x  30"  ASSESSMENT:  CLINICAL IMPRESSION: Progressed to standing functional strengthening exercises for gluteal and core strengthening with min cueing for core activation.  Reviewed mechanics with femoral nerve glide with reports of decreased radicular symptoms.  EOS reports pain reduced to 5/10.  EVAL: Patient is a 68 y.o. female who was seen today for physical therapy evaluation and treatment for lumbar scoliosis. Patient's condition is further defined by difficulty with sitting, standing, and walking due to pain, weakness, and decreased soft tissue extensibility. Skilled PT is required to address the impairments and functional limitations listed  below.   OBJECTIVE IMPAIRMENTS: decreased activity tolerance, difficulty walking, decreased ROM, decreased strength, impaired flexibility, postural dysfunction, and pain.   ACTIVITY LIMITATIONS: carrying, lifting, bending, sitting, standing, squatting, and hygiene/grooming  PARTICIPATION LIMITATIONS: meal prep, cleaning, laundry, driving, shopping, community activity, and yard work  PERSONAL FACTORS: Time since onset of injury/illness/exacerbation and 1 comorbidity: hx of low back surgery  are also affecting patient's functional outcome.   REHAB POTENTIAL: Fair    CLINICAL DECISION MAKING: Stable/uncomplicated  EVALUATION COMPLEXITY: Low   GOALS: Goals reviewed with patient? Yes  SHORT TERM GOALS: Target date: 11/17/23  Pt will demonstrate indep in HEP to facilitate carry-over of skilled services and improve functional outcomes Goal status: INITIAL  LONG TERM GOALS: Target date: 12/01/23  Pt will demonstrate a decrease in modified ODI score by 13-14% to demonstrate significant improvement in ADLs Baseline: 56% Goal status: INITIAL  2.  Pt will decrease 5TSTS by at least 3 seconds in order to demonstrate clinically significant improvement in LE strength  Baseline: 11.90 sec Goal status: INITIAL  3.  Pt will be able to do > or = to  566 ft with slight to mild pain (3-4/10) in order to demonstrate clinically significant improvement in community ambulation Baseline: 566 ft with 6/10 pain Goal status: INITIAL  4.  Pt will demonstrate increase in LE strength to 4-/5 to facilitate ease and safety in ambulation Baseline: 3+/5 Goal status: INITIAL  5.  Pt will be able to tolerate > 30 minutes of sitting with slight to mild pain (2-4/10) to facilitate ease in ADLs Baseline: see above Goal status: INITIAL  PLAN:  PT FREQUENCY: 2x/week  PT DURATION: 4 weeks  PLANNED INTERVENTIONS: 97164- PT Re-evaluation, 97110-Therapeutic exercises, 97530- Therapeutic activity, 97112- Neuromuscular re-education, 97535- Self Care, 16109- Manual therapy, 97116- Gait training, Patient/Family education, Taping, Dry Needling, Cryotherapy, and Moist heat.  PLAN FOR NEXT SESSION: Continue POC and may progress as tolerated with emphasis on LE flexibility, core and hip strength; sees her MD at the end of the month.    Becky Sax, LPTA/CLT; CBIS [phone]  8:11 AM, 11/23/23

## 2023-11-25 ENCOUNTER — Ambulatory Visit (HOSPITAL_COMMUNITY): Payer: PPO | Admitting: Physical Therapy

## 2023-11-25 DIAGNOSIS — J301 Allergic rhinitis due to pollen: Secondary | ICD-10-CM | POA: Diagnosis not present

## 2023-11-25 DIAGNOSIS — R262 Difficulty in walking, not elsewhere classified: Secondary | ICD-10-CM | POA: Diagnosis not present

## 2023-11-25 DIAGNOSIS — J3089 Other allergic rhinitis: Secondary | ICD-10-CM | POA: Diagnosis not present

## 2023-11-25 DIAGNOSIS — M419 Scoliosis, unspecified: Secondary | ICD-10-CM

## 2023-11-25 DIAGNOSIS — J3081 Allergic rhinitis due to animal (cat) (dog) hair and dander: Secondary | ICD-10-CM | POA: Diagnosis not present

## 2023-11-25 DIAGNOSIS — M5416 Radiculopathy, lumbar region: Secondary | ICD-10-CM

## 2023-11-25 NOTE — Therapy (Signed)
 OUTPATIENT PHYSICAL THERAPY THORACOLUMBAR TREATMENT   Patient Name: Kathryn [name] MRN: [id number] DOB:1956-09-07, 68 y.o., female Today's Date: 11/25/2023  END OF SESSION:  PT End of Session - 11/25/23 0727     Visit Number 7    Number of Visits 8    Date for PT Re-Evaluation 12/01/23    Authorization Type [employer] (no auth)    PT Start Time 0718    PT Stop Time 0800    PT Time Calculation (min) 42 min    Activity Tolerance Patient tolerated treatment well    Behavior During Therapy [facility] for tasks assessed/performed               Past Medical History:  Diagnosis Date   Adrenal insufficiency (HCC)    occured following steroid shoulder injections ~ spring 2017   Anxiety    pt. reports that she has RLS- takes Xanax for calming her self so she can settle & sleep    Arthritis    shoulder & back, knees & leg     Asthma    aleery related   Atypical mole 11/12/2020   mod-severe- mid back   Chest pain, atypical    had cardiac workup - deemed related to anxiety   Chronic bronchitis (HCC)    "less since I quit smoking" (02/21/2016)   Chronic lower back pain    Dyspnea    related to spray paint that is put in  beer cans at her place of employment.  Pt. also reports that she has anxiety also that leads to SOB., Pt. reports her Mother just died a week ago     GERD (gastroesophageal reflux disease)    Guillain-Barre syndrome following vaccination (HCC) 1970's   post swine flu shot, hosp. for treatment for 1 month    H/O dizziness    H/O hiatal hernia    Hepatitis    hepatitis as a child; "got it from a little girl at school" make sick and vomiting   Herpes simplex    History of Guillain-Barre syndrome ~ 1978   "from the swine flu shot"   IBS (irritable bowel syndrome)    Pneumonia    PONV (postoperative nausea and vomiting)    Restless leg syndrome    Walking pneumonia ~ 2011   Past Surgical History:  Procedure Laterality Date   83 HOUR PH STUDY N/A  09/23/2016   Procedure: 24 HOUR PH STUDY;  Surgeon: Napoleon Form, MD;  Location: [facility];  Service: Endoscopy;  Laterality: N/A;   ANTERIOR CERVICAL DECOMP/DISCECTOMY FUSION  2000 & 2015   two times ago- C5 and C6, Dr. Clydene Fake, MD 2015   APPENDECTOMY  1980s   BACK SURGERY     BIOPSY  03/24/2022   Procedure: BIOPSY;  Surgeon: Dolores Frame, MD;  [facility]: [facility];  Service: Gastroenterology;;   BREAST BIOPSY Right    benign   CHOLECYSTECTOMY OPEN  1980s   COLONOSCOPY N/A 09/20/2014   rehman: Prep was excellent except she had thick layer of stool coating cecal and ascending colon mucosa. Cecal landmarks were well identified after vigorous washing. Small lymphoid polyp ablated via cold biopsy from appendiceal stump.Mucosa of rest of the colon was normal. Normal mucosa of rectum and anorectal junction.   ESOPHAGEAL DILATION N/A 05/29/2019   Procedure: ESOPHAGEAL DILATION;  Surgeon: Malissa Hippo, MD;  [facility];  Service: Endoscopy;  Laterality: N/A;   ESOPHAGEAL MANOMETRY N/A 09/23/2016  Procedure: ESOPHAGEAL MANOMETRY (EM);  Surgeon: Napoleon Form, MD;  Location: [facility];  Service: Endoscopy;  Laterality: N/A;   ESOPHAGOGASTRODUODENOSCOPY N/A 09/20/2014   Procedure: ESOPHAGOGASTRODUODENOSCOPY (EGD);  Surgeon: Malissa Hippo, MD;  [facility];  Service: Endoscopy;  Laterality: N/A;   ESOPHAGOGASTRODUODENOSCOPY (EGD) WITH PROPOFOL N/A 05/29/2019   - Z-line, 38 cm from the incisors.   ESOPHAGOGASTRODUODENOSCOPY (EGD) WITH PROPOFOL N/A 03/24/2022   Procedure: ESOPHAGOGASTRODUODENOSCOPY (EGD) WITH PROPOFOL;  Surgeon: Dolores Frame, MD;  [facility];  Service: Gastroenterology;  Laterality: N/A;  730 ASA 1   HIATAL HERNIA REPAIR N/A 11/27/2016   Procedure: LAPAROSCOPIC REPAIR OF HIATAL HERNIA WITH NISSEN FUNDOPLICATION;  Surgeon: Avel Peace, MD;  Location: [facility];  Service: General;  Laterality:  N/A;   INCISION AND DRAINAGE OF WOUND Left 02/21/2016   forearm, minor   IR ANGIO INTRA EXTRACRAN SEL INTERNAL CAROTID BILAT MOD SED  11/03/2022   IR ANGIO VERTEBRAL SEL VERTEBRAL UNI L MOD SED  11/03/2022   LUMBAR LAMINECTOMY/DECOMPRESSION MICRODISCECTOMY Right 06/22/2013   Procedure: RIGHT LUMBAR TWO-THREE DISCECTOMY;  Surgeon: Clydene Fake, MD;  Location: [facility];  Service: Neurosurgery;  Laterality: Right;  Right    MALONEY DILATION N/A 09/20/2014   Procedure: Elease Hashimoto DILATION;  Surgeon: Malissa Hippo, MD;  [facility];  Service: Endoscopy;  Laterality: N/A;   MINOR IRRIGATION AND DEBRIDEMENT OF WOUND Left 02/21/2016   Procedure: MINOR IRRIGATION AND DEBRIDEMENT/REPAIR OF LEFT ARM WOUND;  Surgeon: Beverely Low, MD;  Location: [facility];  Service: Orthopedics;  Laterality: Left;   REVERSE SHOULDER ARTHROPLASTY Right 02/21/2016   Procedure: RIGHT REVERSE TOTAL SHOULDER ARTHROPLASTY;  Surgeon: Beverely Low, MD;  [facility]: [facility];  Service: Orthopedics;  Laterality: Right;   REVERSE TOTAL SHOULDER ARTHROPLASTY Right 02/21/2016   SAVORY DILATION  03/24/2022   Procedure: SAVORY DILATION;  Surgeon: Marguerita Merles, Reuel Boom, MD;  [facility];  Service: Gastroenterology;;   SHOULDER ARTHROSCOPY W/ ROTATOR CUFF REPAIR Right 1990s   TOTAL SHOULDER REPLACEMENT     Right   VAGINAL HYSTERECTOMY     Patient Active Problem List   Diagnosis Date Noted   Elevated LFTs 08/24/2022   Depression 06/18/2022   TIA (transient ischemic attack) 06/17/2022   Chronic pain 06/17/2022   Hyperglycemia 06/17/2022   Anxiety 06/17/2022   Asthma, chronic 06/17/2022   Hypothyroidism 06/17/2022   Restless leg syndrome 06/17/2022   Nausea without vomiting 02/10/2022   Diarrhea 04/09/2021   Pain in right knee 11/23/2019   IBS (irritable bowel syndrome) 06/27/2019   Dysphagia 05/08/2019   Paresthesias 04/27/2019   Hiatal hernia with GERD without esophagitis 11/27/2016    Gastroesophageal reflux disease    Hiatal hernia    S/P shoulder replacement 02/21/2016   DYSPNEA 12/13/2009   CHEST PAIN, ATYPICAL 12/13/2009    PCP: Assunta Found MD  REFERRING PROVIDER: Bedelia Person, MD  REFERRING DIAG: M41.26 (ICD-10-CM) - Other idiopathic scoliosis, lumbar region  Rationale for Evaluation and Treatment: Rehabilitation  THERAPY DIAG:  Difficulty in walking, not elsewhere classified  Scoliosis of lumbar spine, unspecified scoliosis type  Radiculopathy, lumbar region  ONSET DATE: 3 years ago  SUBJECTIVE:  SUBJECTIVE STATEMENT: Pt  reports the exercises have helped with her strength and general function, however her pain level remains the same 7/10 in LBP with unchanged radicular symptoms into bil buttocks.  Worse on Lt but does go into Rt occasionally.   EVAL: Arrives to the clinic with low back pain (see below). Patient reports that the legs are numb and weak. Condition started for around 3 years without apparent reason. Patient reports that she has had multiple episode of falls. Patient reports that she had back surgery in 2014 and the area above the surgery collapsed. Recently, patient was told by her MD that she will have surgery again but will need physical therapy first to strengthen her back  PERTINENT HISTORY:  Back surgery/fusion in 2014  PAIN:  Are you having pain? Yes: NPRS scale: 7/10 Pain location: low back on the L side, shoots to the L buttocks to the thigh and sometimes to the L calf Pain description: aching, constant Aggravating factors: sitting ~ 30 minutes, walking and standing ~ 30 minutes Relieving factors: Ibuprofen  PRECAUTIONS: None  RED FLAGS: None   WEIGHT BEARING RESTRICTIONS: No  FALLS:  Has patient fallen in last 6 months? Yes.  Number of falls 1  LIVING ENVIRONMENT: Lives with: lives with their spouse Lives in: Mobile home Stairs: Yes: External: 4 steps; bilateral but cannot reach both Has following equipment at home: None  OCCUPATION: retired  PLOF: Independent and Independent with basic ADLs  PATIENT GOALS: "I hope it will help"  NEXT MD VISIT: March 2025  OBJECTIVE:  Note: Objective measures were completed at Evaluation unless otherwise noted.  DIAGNOSTIC FINDINGS:  06/22/23 MRI LUMBAR SPINE WITHOUT CONTRAST   TECHNIQUE: Multiplanar, multisequence MR imaging of the lumbar spine was performed. No intravenous contrast was administered.   COMPARISON:  Films lumbar spine 05/05/2023. MRI lumbar spine 12/21/2017.   FINDINGS: Segmentation:  Standard.   Alignment: As seen on the prior plain films, there is convex left lumbar scoliosis with the apex at L3.   Vertebrae: No fracture, evidence of discitis, or bone lesion. The patient is status post L4-5 discectomy and fusion as seen on the prior studies. There is degenerative endplate signal change eccentric to the right at T12-L1 and L1-2.   Conus medullaris and cauda equina: Conus extends to the T12-L1 level. Conus and cauda equina appear normal.   Paraspinal and other soft tissues: Left renal cyst is unchanged.   Disc levels:   T11-12: This level is incompletely imaged in the sagittal and axial planes. Shallow disc bulge is seen but no stenosis is identified.   T12-L1: Mild-to-moderate facet degenerative disease and a shallow disc bulge to the left. The central canal and right foramen are open. Mild left foraminal narrowing is present.   L1-2: There is loss of disc space height with a broad-based disc bulge and mild-to-moderate facet degenerative disease. Mild to moderate central canal stenosis appears unchanged. The foramina are open.   L2-3: Moderate bilateral facet arthropathy is worse on the right. There is a disc bulge with  endplate spurring, more prominent to the right. Moderate narrowing in the right subarticular recess and foramen appears unchanged. The left foramen is open.   L3-4: Disc bulge and endplate spur, ligamentum flavum thickening and facet arthropathy are seen. Moderate central canal stenosis appears worse than on the prior exam. Mild to moderate foraminal narrowing is greater on the right.   L4-5: Status post discectomy and fusion. No stenosis. Small postoperative seroma in the laminectomy  bed is unchanged.   L5-S1: Advanced bilateral facet degenerative disease is present no disc bulge or protrusion. No stenosis.   IMPRESSION: 1. Status post L4-5 discectomy and fusion without stenosis. 2. Spondylosis at L3-4 appears worse than on the prior exam. Moderate central canal stenosis is present. Mild to moderate foraminal narrowing is greater on the right. 3. No change in mild to moderate central canal stenosis at L1-2. 4. No change in moderate narrowing in the right subarticular recess and foramen at L2-3.  PATIENT SURVEYS:  Modified Oswestry 28/50 = 56%   COGNITION: Overall cognitive status: Within functional limits for tasks assessed     SENSATION: Light touch: Impaired l LE  MUSCLE LENGTH: Hamstrings: mild restriction on B Thomas test: slight restriction on B Piriformis test: slight restriction on B  POSTURE: rounded shoulders, forward head, and increased lumbar lordosis  PALPATION: No tenderness on major bony landmarks and muscle mass of the lumbar spine  LUMBAR ROM:   AROM eval  Flexion 50%  Extension 75%*  Right lateral flexion   Left lateral flexion   Right rotation 100%  Left rotation 50%   (Blank rows = not tested) *worsened radicular symptoms.   LOWER EXTREMITY ROM:     Active  Right eval Left eval  Hip flexion [facility]  Hip extension [facility]  Hip abduction [facility]  Hip adduction    Hip internal rotation    Hip external rotation    Knee flexion [facility] WFL   Knee extension [facility]  Ankle dorsiflexion [facility] WFL  Ankle plantarflexion [facility] WFL  Ankle inversion    Ankle eversion     (Blank rows = not tested)  LOWER EXTREMITY MMT:    MMT Right eval Left eval  Hip flexion 4 4  Hip extension 3+ 3+  Hip abduction 4- 4-  Hip adduction    Hip internal rotation    Hip external rotation    Knee flexion 4+ 4+  Knee extension 4+ 4+  Ankle dorsiflexion 4+ 4+  Ankle plantarflexion 4+ 4+  Ankle inversion    Ankle eversion     (Blank rows = not tested)  LUMBAR SPECIAL TESTS:  Straight leg raise test: Positive, FABER test: Negative, and Double knee to chest relieved radicular symptoms  FUNCTIONAL TESTS:  5 times sit to stand: 11.90 sec 2 minute walk test: 566 ft with pain = 6/10  GAIT: Distance walked: 566 ft Assistive device utilized: None Level of assistance: Complete Independence Comments: done during  TREATMENT DATE:  11/25/23: [place] x 5' dynamic warm up level 4 seat 8 Standing: Marching 10x 5" Squat front of chair 2x 10 cueing for eccentric control Sidestep with RTB around thigh 3RT Lunges onto 4" step no UE 2X10 each side Rows RTB 2X10 Shoulder extension RTB 2X10 Pallof RTB with normal BOS 2X10 each side Vectors 5X5"   11/23/23: [place] x 5' dynamic warm up Standing:  Squat front of chair 2x 10 cueing for eccentric control Marching 10x 5" Sidestep with RTB around thigh 3RT  Supine: Heel slide with feet on ball LTR with feet on ball 10x Bridge with RTB around thigh 10x Hamstring stretch with rope 3 x 30"  Sidelying: Femoral nerve glide 10x reviewed mechanics  11/18/23:   Nustep seat 8 x 5' dynamic warm up Seated posture with additional lumbar support Sidelying femoral nerve glide 10x  Supine: Decompression with RTB 1-4 10x each Bridge with RTB around thigh 15x 5" Hamstring stretch  with rope 3x 30"  11/16/23: Nustep seat 8 x 5' dynamic warm up  Seated: Hamstring stretch 30" x  3 each Supine: Manual distraction left leg 10" hold x 5 Decompression exercises, head press, shoulder press, leg press and leg lengthener Horizontal Shoulder abduction with RTB 2 x 10 Bow and arrow x 8 each RTB Sidelying clam RTB 2 x 8  11/11/23: Supine:  Decompression 2-5 5x 5" Deep breathing with 1 hand chest/1 on stomach x 3 min TrA activation paired with exhale x 2 min SLR with post pelvic tilt x 10 x 2 on each Bridging x 3" x 10 x 2 Hamstring stretch 3x 30" (hands behind knee)  Sidelying: Clam with RTB 10x 5"  11/09/23 Reviewed goals NuStep, seat 8, level 1, > 60 SPM, 5' Seated hamstring stretch x 30" x 3 Post pelvic tilts x 3" x 10 x 2 Double knee to chest x 15" x 5 Bridging x 3" x 10 x 2 SLR with post pelvic tilt x 10 x 2 on each Seated abdominal press with physioball x 3" x 10 x 2  11/03/23 Evaluation and patient education done                                                                                                                               PATIENT EDUCATION:  Education details: Educated on the pathoanatomy of low back pain. Educated on the goals and course of rehab.  Person educated: Patient Education method: Explanation Education comprehension: verbalized understanding  HOME EXERCISE PROGRAM: Access Code: [email or web address] URL: [email or web address] Date: 11/09/2023 Prepared by: Krystal Clark  Exercises - Seated Hamstring Stretch  - 1-2 x daily - 5-7 x weekly - 3 reps - 30 hold - Supine Posterior Pelvic Tilt  - 1-2 x daily - 5-7 x weekly - 2 sets - 10 reps - 3 hold - Supine Double Knee to Chest  - 1-2 x daily - 5-7 x weekly - 5 reps - 15 hold - Supine Active Straight Leg Raise  - 1-2 x daily - 5-7 x weekly - 2 sets - 10 reps - Seated Abdominal Press into [employer]  - 1-2 x daily - 5-7 x weekly - 2 sets - 10 reps - 3 hold  11/11/23: Access Code: [email or web address] URL: [email or web address] Date: 11/11/2023 Prepared by: Becky Sax  Exercises -Decompression - Supine Bridge  - 1-2 x daily - 7 x weekly - 3 sets - 10 reps -clam with RTB  11/18/23:- Sidelying Femoral Nerve Glide - Top Leg  - 1 x daily - 7 x weekly - 3 sets - 10 reps  Decompression with RTB   Supine hamstring stretch with rope 3x 30"  Date: 11/25/2023 Exercises - Standing 3-Way Kick  - 2 x daily - 2 sets - 5 reps - 5 sec hold - Seated Piriformis Stretch with Trunk Bend  - 2 x daily - 2 sets - 3 reps - 30 sec hold  ASSESSMENT:  CLINICAL IMPRESSION: Continued with focus on standing functional strengthening exercises for gluteal and core.  Added postural strengthening as well as core stabilization using theraband.  Added vectors (3 way kick) for glut stabilization and finished up with instruction for seated piriformis stretch.  Good results from this stretch.  Updated HEP to include vectors and piriformis stretch. Min cueing for core activation and general form/hold times.  No pain during session with reports pain reduced but "pinching" at end of session.  EVAL: Patient is a 68 y.o. female who was seen today for physical therapy evaluation and treatment for lumbar scoliosis. Patient's condition is further defined by difficulty with sitting, standing, and walking due to pain, weakness, and decreased soft tissue extensibility. Skilled PT is required to address the impairments and functional limitations listed below.   OBJECTIVE IMPAIRMENTS: decreased activity tolerance, difficulty walking, decreased ROM, decreased strength, impaired flexibility, postural dysfunction, and pain.   ACTIVITY LIMITATIONS: carrying, lifting, bending, sitting, standing, squatting, and hygiene/grooming  PARTICIPATION LIMITATIONS: meal prep, cleaning, laundry, driving, shopping, community activity, and yard work  PERSONAL FACTORS: Time since onset of injury/illness/exacerbation and 1 comorbidity: hx of low back surgery  are also affecting patient's functional outcome.    REHAB POTENTIAL: Fair    CLINICAL DECISION MAKING: Stable/uncomplicated  EVALUATION COMPLEXITY: Low   GOALS: Goals reviewed with patient? Yes  SHORT TERM GOALS: Target date: 11/17/23  Pt will demonstrate indep in HEP to facilitate carry-over of skilled services and improve functional outcomes Goal status: INITIAL  LONG TERM GOALS: Target date: 12/01/23  Pt will demonstrate a decrease in modified ODI score by 13-14% to demonstrate significant improvement in ADLs Baseline: 56% Goal status: INITIAL  2.  Pt will decrease 5TSTS by at least 3 seconds in order to demonstrate clinically significant improvement in LE strength  Baseline: 11.90 sec Goal status: INITIAL  3.  Pt will be able to do > or = to 566 ft with slight to mild pain (3-4/10) in order to demonstrate clinically significant improvement in community ambulation Baseline: 566 ft with 6/10 pain Goal status: INITIAL  4.  Pt will demonstrate increase in LE strength to 4-/5 to facilitate ease and safety in ambulation Baseline: 3+/5 Goal status: INITIAL  5.  Pt will be able to tolerate > 30 minutes of sitting with slight to mild pain (2-4/10) to facilitate ease in ADLs Baseline: see above Goal status: INITIAL  PLAN:  PT FREQUENCY: 2x/week  PT DURATION: 4 weeks  PLANNED INTERVENTIONS: [id number]- PT Re-evaluation, 97110-Therapeutic exercises, 97530- Therapeutic activity, 97112- Neuromuscular re-education, 97535- Self Care, [id number]- Manual therapy, 97116- Gait training, Patient/Family education, Taping, Dry Needling, Cryotherapy, and Moist heat.  PLAN FOR NEXT SESSION: Continue POC and may progress as tolerated with emphasis on LE flexibility, core and hip strength; sees her MD at the end of the month.  Update HEP to include postural exercises and pallof with theraband next session and complete reassessment.  Lurena Nida, PTA/CLT [facility] Ph: [phone]  7:27 AM,  11/25/23

## 2023-11-29 ENCOUNTER — Encounter (HOSPITAL_COMMUNITY): Payer: Self-pay

## 2023-11-29 ENCOUNTER — Ambulatory Visit (HOSPITAL_COMMUNITY): Payer: PPO

## 2023-11-29 DIAGNOSIS — R262 Difficulty in walking, not elsewhere classified: Secondary | ICD-10-CM | POA: Diagnosis not present

## 2023-11-29 DIAGNOSIS — M419 Scoliosis, unspecified: Secondary | ICD-10-CM

## 2023-11-29 NOTE — Therapy (Signed)
 OUTPATIENT PHYSICAL THERAPY THORACOLUMBAR TREATMENT   Patient Name: Kathryn [name] MRN: [id number] DOB:Oct 12, 1955, 68 y.o., female Today's Date: 11/30/2023  END OF SESSION:  PT End of Session - 11/29/23 0759     Visit Number 8    Number of Visits 16    Date for PT Re-Evaluation 12/30/23    Authorization Type [employer] (no auth)    PT Start Time 0801    PT Stop Time 0843    PT Time Calculation (min) 42 min    Activity Tolerance Patient tolerated treatment well    Behavior During Therapy [facility] for tasks assessed/performed               Past Medical History:  Diagnosis Date   Adrenal insufficiency (HCC)    occured following steroid shoulder injections ~ spring 2017   Anxiety    pt. reports that she has RLS- takes Xanax for calming her self so she can settle & sleep    Arthritis    shoulder & back, knees & leg     Asthma    aleery related   Atypical mole 11/12/2020   mod-severe- mid back   Chest pain, atypical    had cardiac workup - deemed related to anxiety   Chronic bronchitis (HCC)    "less since I quit smoking" (02/21/2016)   Chronic lower back pain    Dyspnea    related to spray paint that is put in  beer cans at her place of employment.  Pt. also reports that she has anxiety also that leads to SOB., Pt. reports her Mother just died a week ago     GERD (gastroesophageal reflux disease)    Guillain-Barre syndrome following vaccination (HCC) 1970's   post swine flu shot, hosp. for treatment for 1 month    H/O dizziness    H/O hiatal hernia    Hepatitis    hepatitis as a child; "got it from a little girl at school" make sick and vomiting   Herpes simplex    History of Guillain-Barre syndrome ~ 1978   "from the swine flu shot"   IBS (irritable bowel syndrome)    Pneumonia    PONV (postoperative nausea and vomiting)    Restless leg syndrome    Walking pneumonia ~ 2011   Past Surgical History:  Procedure Laterality Date   61 HOUR PH STUDY N/A  09/23/2016   Procedure: 24 HOUR PH STUDY;  Surgeon: Napoleon Form, MD;  Location: [facility];  Service: Endoscopy;  Laterality: N/A;   ANTERIOR CERVICAL DECOMP/DISCECTOMY FUSION  2000 & 2015   two times ago- C5 and C6, Dr. Clydene Fake, MD 2015   APPENDECTOMY  1980s   BACK SURGERY     BIOPSY  03/24/2022   Procedure: BIOPSY;  Surgeon: Dolores Frame, MD;  [facility]: [facility];  Service: Gastroenterology;;   BREAST BIOPSY Right    benign   CHOLECYSTECTOMY OPEN  1980s   COLONOSCOPY N/A 09/20/2014   rehman: Prep was excellent except she had thick layer of stool coating cecal and ascending colon mucosa. Cecal landmarks were well identified after vigorous washing. Small lymphoid polyp ablated via cold biopsy from appendiceal stump.Mucosa of rest of the colon was normal. Normal mucosa of rectum and anorectal junction.   ESOPHAGEAL DILATION N/A 05/29/2019   Procedure: ESOPHAGEAL DILATION;  Surgeon: Malissa Hippo, MD;  [facility];  Service: Endoscopy;  Laterality: N/A;   ESOPHAGEAL MANOMETRY N/A 09/23/2016  Procedure: ESOPHAGEAL MANOMETRY (EM);  Surgeon: Napoleon Form, MD;  [facility];  Service: Endoscopy;  Laterality: N/A;   ESOPHAGOGASTRODUODENOSCOPY N/A 09/20/2014   Procedure: ESOPHAGOGASTRODUODENOSCOPY (EGD);  Surgeon: Malissa Hippo, MD;  [facility];  Service: Endoscopy;  Laterality: N/A;   ESOPHAGOGASTRODUODENOSCOPY (EGD) WITH PROPOFOL N/A 05/29/2019   - Z-line, 38 cm from the incisors.   ESOPHAGOGASTRODUODENOSCOPY (EGD) WITH PROPOFOL N/A 03/24/2022   Procedure: ESOPHAGOGASTRODUODENOSCOPY (EGD) WITH PROPOFOL;  Surgeon: Dolores Frame, MD;  [facility];  Service: Gastroenterology;  Laterality: N/A;  730 ASA 1   HIATAL HERNIA REPAIR N/A 11/27/2016   Procedure: LAPAROSCOPIC REPAIR OF HIATAL HERNIA WITH NISSEN FUNDOPLICATION;  Surgeon: Avel Peace, MD;  Location: [facility];  Service: General;  Laterality:  N/A;   INCISION AND DRAINAGE OF WOUND Left 02/21/2016   forearm, minor   IR ANGIO INTRA EXTRACRAN SEL INTERNAL CAROTID BILAT MOD SED  11/03/2022   IR ANGIO VERTEBRAL SEL VERTEBRAL UNI L MOD SED  11/03/2022   LUMBAR LAMINECTOMY/DECOMPRESSION MICRODISCECTOMY Right 06/22/2013   Procedure: RIGHT LUMBAR TWO-THREE DISCECTOMY;  Surgeon: Clydene Fake, MD;  [facility]: [facility];  Service: Neurosurgery;  Laterality: Right;  Right    MALONEY DILATION N/A 09/20/2014   Procedure: Elease Hashimoto DILATION;  Surgeon: Malissa Hippo, MD;  [facility];  Service: Endoscopy;  Laterality: N/A;   MINOR IRRIGATION AND DEBRIDEMENT OF WOUND Left 02/21/2016   Procedure: MINOR IRRIGATION AND DEBRIDEMENT/REPAIR OF LEFT ARM WOUND;  Surgeon: Beverely Low, MD;  Location: [facility];  Service: Orthopedics;  Laterality: Left;   REVERSE SHOULDER ARTHROPLASTY Right 02/21/2016   Procedure: RIGHT REVERSE TOTAL SHOULDER ARTHROPLASTY;  Surgeon: Beverely Low, MD;  [facility];  Service: Orthopedics;  Laterality: Right;   REVERSE TOTAL SHOULDER ARTHROPLASTY Right 02/21/2016   SAVORY DILATION  03/24/2022   Procedure: SAVORY DILATION;  Surgeon: Marguerita Merles, Reuel Boom, MD;  [facility];  Service: Gastroenterology;;   SHOULDER ARTHROSCOPY W/ ROTATOR CUFF REPAIR Right 1990s   TOTAL SHOULDER REPLACEMENT     Right   VAGINAL HYSTERECTOMY     Patient Active Problem List   Diagnosis Date Noted   Elevated LFTs 08/24/2022   Depression 06/18/2022   TIA (transient ischemic attack) 06/17/2022   Chronic pain 06/17/2022   Hyperglycemia 06/17/2022   Anxiety 06/17/2022   Asthma, chronic 06/17/2022   Hypothyroidism 06/17/2022   Restless leg syndrome 06/17/2022   Nausea without vomiting 02/10/2022   Diarrhea 04/09/2021   Pain in right knee 11/23/2019   IBS (irritable bowel syndrome) 06/27/2019   Dysphagia 05/08/2019   Paresthesias 04/27/2019   Hiatal hernia with GERD without esophagitis 11/27/2016    Gastroesophageal reflux disease    Hiatal hernia    S/P shoulder replacement 02/21/2016   DYSPNEA 12/13/2009   CHEST PAIN, ATYPICAL 12/13/2009    PCP: Assunta Found MD  REFERRING PROVIDER: Bedelia Person, MD  REFERRING DIAG: M41.26 (ICD-10-CM) - Other idiopathic scoliosis, lumbar region  Rationale for Evaluation and Treatment: Rehabilitation  THERAPY DIAG:  Difficulty in walking, not elsewhere classified  Scoliosis of lumbar spine, unspecified scoliosis type  ONSET DATE: 3 years ago  SUBJECTIVE:  SUBJECTIVE STATEMENT: Reports 5/10 pain. Reports she went to a conference and had to take 2 pain medications because sitting hurt her back. Going to see the doctor on Friday and will ask to wait to have surgery.  End of session pain dec 4/10.   EVAL: Arrives to the clinic with low back pain (see below). Patient reports that the legs are numb and weak. Condition started for around 3 years without apparent reason. Patient reports that she has had multiple episode of falls. Patient reports that she had back surgery in 2014 and the area above the surgery collapsed. Recently, patient was told by her MD that she will have surgery again but will need physical therapy first to strengthen her back  PERTINENT HISTORY:  Back surgery/fusion in 2014  PAIN:  Are you having pain? Yes: NPRS scale: 7/10 Pain location: low back on the L side, shoots to the L buttocks to the thigh and sometimes to the L calf Pain description: aching, constant Aggravating factors: sitting ~ 30 minutes, walking and standing ~ 30 minutes Relieving factors: Ibuprofen  PRECAUTIONS: None  RED FLAGS: None   WEIGHT BEARING RESTRICTIONS: No  FALLS:  Has patient fallen in last 6 months? Yes. Number of falls 1  LIVING  ENVIRONMENT: Lives with: lives with their spouse Lives in: Mobile home Stairs: Yes: External: 4 steps; bilateral but cannot reach both Has following equipment at home: None  OCCUPATION: retired  PLOF: Independent and Independent with basic ADLs  PATIENT GOALS: "I hope it will help"  NEXT MD VISIT: March 2025  OBJECTIVE:  Note: Objective measures were completed at Evaluation unless otherwise noted.  DIAGNOSTIC FINDINGS:  06/22/23 MRI LUMBAR SPINE WITHOUT CONTRAST   TECHNIQUE: Multiplanar, multisequence MR imaging of the lumbar spine was performed. No intravenous contrast was administered.   COMPARISON:  Films lumbar spine 05/05/2023. MRI lumbar spine 12/21/2017.   FINDINGS: Segmentation:  Standard.   Alignment: As seen on the prior plain films, there is convex left lumbar scoliosis with the apex at L3.   Vertebrae: No fracture, evidence of discitis, or bone lesion. The patient is status post L4-5 discectomy and fusion as seen on the prior studies. There is degenerative endplate signal change eccentric to the right at T12-L1 and L1-2.   Conus medullaris and cauda equina: Conus extends to the T12-L1 level. Conus and cauda equina appear normal.   Paraspinal and other soft tissues: Left renal cyst is unchanged.   Disc levels:   T11-12: This level is incompletely imaged in the sagittal and axial planes. Shallow disc bulge is seen but no stenosis is identified.   T12-L1: Mild-to-moderate facet degenerative disease and a shallow disc bulge to the left. The central canal and right foramen are open. Mild left foraminal narrowing is present.   L1-2: There is loss of disc space height with a broad-based disc bulge and mild-to-moderate facet degenerative disease. Mild to moderate central canal stenosis appears unchanged. The foramina are open.   L2-3: Moderate bilateral facet arthropathy is worse on the right. There is a disc bulge with endplate spurring, more prominent  to the right. Moderate narrowing in the right subarticular recess and foramen appears unchanged. The left foramen is open.   L3-4: Disc bulge and endplate spur, ligamentum flavum thickening and facet arthropathy are seen. Moderate central canal stenosis appears worse than on the prior exam. Mild to moderate foraminal narrowing is greater on the right.   L4-5: Status post discectomy and fusion. No stenosis. Small postoperative seroma  in the laminectomy bed is unchanged.   L5-S1: Advanced bilateral facet degenerative disease is present no disc bulge or protrusion. No stenosis.   IMPRESSION: 1. Status post L4-5 discectomy and fusion without stenosis. 2. Spondylosis at L3-4 appears worse than on the prior exam. Moderate central canal stenosis is present. Mild to moderate foraminal narrowing is greater on the right. 3. No change in mild to moderate central canal stenosis at L1-2. 4. No change in moderate narrowing in the right subarticular recess and foramen at L2-3.  PATIENT SURVEYS:  Modified Oswestry 28/50 = 56%  11/29/23: 20 / 50 = 40.0 %  COGNITION: Overall cognitive status: Within functional limits for tasks assessed     SENSATION: Light touch: Impaired l LE  MUSCLE LENGTH: Hamstrings: mild restriction on B Thomas test: slight restriction on B Piriformis test: slight restriction on B  POSTURE: rounded shoulders, forward head, and increased lumbar lordosis  PALPATION: No tenderness on major bony landmarks and muscle mass of the lumbar spine  LUMBAR ROM:   AROM eval 11/29/23  Flexion 50% 50% pain on L  Extension 75%* 75%  Right lateral flexion    Left lateral flexion    Right rotation 100% 100%  Left rotation 50% 100%   (Blank rows = not tested) *worsened radicular symptoms.     LOWER EXTREMITY ROM:     Active  Right eval Left eval  Hip flexion [facility]  Hip extension [facility]  Hip abduction [facility]  Hip adduction    Hip internal rotation    Hip external  rotation    Knee flexion [facility]  Knee extension [facility]  Ankle dorsiflexion [facility]  Ankle plantarflexion [facility]  Ankle inversion    Ankle eversion     (Blank rows = not tested)  LOWER EXTREMITY MMT:    MMT Right eval Left eval R  11/29/23 L 11/29/23  Hip flexion 4 4 4/5 4/5  Hip extension 3+ 3+ 4/5 4/5  Hip abduction 4- 4-    Hip adduction      Hip internal rotation      Hip external rotation      Knee flexion 4+ 4+ 4+ 4+  Knee extension 4+ 4+ 4+ 4+  Ankle dorsiflexion 4+ 4+ 5 5  Ankle plantarflexion 4+ 4+    Ankle inversion      Ankle eversion       (Blank rows = not tested)  LUMBAR SPECIAL TESTS:  Straight leg raise test: Positive, FABER test: Negative, and Double knee to chest relieved radicular symptoms  11/29/23: Negative for Leg pain, Pain in L hip and low back  FUNCTIONAL TESTS:  5 times sit to stand: 11.90 sec 2 minute walk test: 566 ft with pain = 6/10  11/29/23: 5xSTS: 12 sec : 509 w/ inc in pain on L side, 5/10  GAIT: Distance walked: 566 ft Assistive device utilized: None Level of assistance: Complete Independence Comments: done during  TREATMENT DATE:  11/30/2023 Progress Note:  -LE MMT -Lumbar ROM -5xSTS - test -Mod Oswestry  Supine with heat: LTR, 2x10 Marches, 2x10  Pt educ for Body Mechanics during func tasks  11/25/23: [employer] x 5' dynamic warm up level 4 seat 8 Standing: Marching 10x 5" Squat front of chair 2x 10 cueing for eccentric control Sidestep with RTB around thigh 3RT Lunges onto 4" step no UE 2X10 each side Rows RTB 2X10 Shoulder extension RTB 2X10 Pallof RTB with normal BOS 2X10 each  side Vectors 5X5"   11/23/23: [employer] [place] x 5' dynamic warm up Standing:  Squat front of chair 2x 10 cueing for eccentric control Marching 10x 5" Sidestep with RTB around thigh 3RT  Supine: Heel slide with feet on ball LTR with feet on ball 10x Bridge with RTB around thigh 10x Hamstring  stretch with rope 3 x 30"  Sidelying: Femoral nerve glide 10x reviewed mechanics                                                                       PATIENT EDUCATION:  Education details: Educated on the pathoanatomy of low back pain. Educated on the goals and course of rehab.  Person educated: Patient Education method: Explanation Education comprehension: verbalized understanding  HOME EXERCISE PROGRAM: Access Code: [email or web address] URL: [email or web address] Date: 11/09/2023 Prepared by: Krystal Clark  Exercises - Seated Hamstring Stretch  - 1-2 x daily - 5-7 x weekly - 3 reps - 30 hold - Supine Posterior Pelvic Tilt  - 1-2 x daily - 5-7 x weekly - 2 sets - 10 reps - 3 hold - Supine Double Knee to Chest  - 1-2 x daily - 5-7 x weekly - 5 reps - 15 hold - Supine Active Straight Leg Raise  - 1-2 x daily - 5-7 x weekly - 2 sets - 10 reps - Seated Abdominal Press into [employer]  - 1-2 x daily - 5-7 x weekly - 2 sets - 10 reps - 3 hold  11/11/23: Access Code: [email or web address] URL: [email or web address] Date: 11/11/2023 Prepared by: Becky Sax  Exercises -Decompression - Supine Bridge  - 1-2 x daily - 7 x weekly - 3 sets - 10 reps -clam with RTB  11/18/23:- Sidelying Femoral Nerve Glide - Top Leg  - 1 x daily - 7 x weekly - 3 sets - 10 reps  Decompression with RTB   Supine hamstring stretch with rope 3x 30"  Date: 11/25/2023 Exercises - Standing 3-Way Kick  - 2 x daily - 2 sets - 5 reps - 5 sec hold - Seated Piriformis Stretch with Trunk Bend  - 2 x daily - 2 sets - 3 reps - 30 sec hold  ASSESSMENT:  CLINICAL IMPRESSION: Progress note performed on this date with patient demonstrating improvements in LE strength, lumbar ROM, and self perceived disability. Patient maintaining around the same range for functional activities. Patient continues to experience pain during daily functional activities. During this session, pain increases with testing but  decreases with heat and mobility activities. Patient will return to MD this week to discuss holding off surgery until fall 2025. Patient expressed difficulty with tying shoes while sitting and bending forward. Shown alternative via bring opposite foot to knee with success. Patient will benefit from continued skilled physical therapy in order to address the impairments and functional limitations listed below.   Continued with focus on standing functional strengthening exercises for gluteal and core.  Added postural strengthening as well as core stabilization using theraband.  Added vectors (3 way kick) for glut stabilization and finished up with instruction for seated piriformis stretch.  Good results from this stretch.  Updated HEP to include vectors and piriformis stretch. Min cueing for core activation and general form/hold times.  No pain  during session with reports pain reduced but "pinching" at end of session.    OBJECTIVE IMPAIRMENTS: decreased activity tolerance, difficulty walking, decreased ROM, decreased strength, impaired flexibility, postural dysfunction, and pain.   ACTIVITY LIMITATIONS: carrying, lifting, bending, sitting, standing, squatting, and hygiene/grooming  PARTICIPATION LIMITATIONS: meal prep, cleaning, laundry, driving, shopping, community activity, and yard work  PERSONAL FACTORS: Time since onset of injury/illness/exacerbation and 1 comorbidity: hx of low back surgery  are also affecting patient's functional outcome.   REHAB POTENTIAL: Fair    CLINICAL DECISION MAKING: Stable/uncomplicated  EVALUATION COMPLEXITY: Low   GOALS: Goals reviewed with patient? Yes  SHORT TERM GOALS: Target date: 11/17/23  Pt will demonstrate indep in HEP to facilitate carry-over of skilled services and improve functional outcomes Goal status: MET  LONG TERM GOALS: Target date: 4/24//25  Pt will demonstrate a decrease in modified ODI score by 13-14% to demonstrate significant  improvement in ADLs Baseline: 56% Goal status: IN PROGRESS  2.  Pt will decrease 5TSTS by at least 3 seconds in order to demonstrate clinically significant improvement in LE strength  Baseline: 11.90 sec Goal status: IN PROGRESS  3.  Pt will be able to do > or = to 566 ft with slight to mild pain (3-4/10) in order to demonstrate clinically significant improvement in community ambulation Baseline: 566 ft with 6/10 pain Goal status: IN PROGRESS  4.  Pt will demonstrate increase in LE strength to 4-/5 to facilitate ease and safety in ambulation Baseline: 3+/5 Goal status: MET  5.  Pt will be able to tolerate > 30 minutes of sitting with slight to mild pain (2-4/10) to facilitate ease in ADLs Baseline: see above Goal status: INITIAL  PLAN:  PT FREQUENCY: 2x/week  PT DURATION: 4 weeks  PLANNED INTERVENTIONS: [id number]- PT Re-evaluation, 97110-Therapeutic exercises, 97530- Therapeutic activity, 97112- Neuromuscular re-education, 97535- Self Care, 91478- Manual therapy, 97116- Gait training, Patient/Family education, Taping, Dry Needling, Cryotherapy, and Moist heat.  PLAN FOR NEXT SESSION: Continue POC and may progress as tolerated with emphasis on LE flexibility, core and hip strength; sees her MD at the end of the month.  Update HEP to include postural exercises and pallof with theraband    10:03 AM, 11/30/23 Chryl Heck, PT, DPT [place] with [facility]

## 2023-12-01 DIAGNOSIS — J301 Allergic rhinitis due to pollen: Secondary | ICD-10-CM | POA: Diagnosis not present

## 2023-12-01 DIAGNOSIS — J3089 Other allergic rhinitis: Secondary | ICD-10-CM | POA: Diagnosis not present

## 2023-12-02 ENCOUNTER — Ambulatory Visit (HOSPITAL_COMMUNITY): Payer: PPO

## 2023-12-02 ENCOUNTER — Encounter (HOSPITAL_COMMUNITY): Payer: Self-pay

## 2023-12-02 DIAGNOSIS — M419 Scoliosis, unspecified: Secondary | ICD-10-CM

## 2023-12-02 DIAGNOSIS — R262 Difficulty in walking, not elsewhere classified: Secondary | ICD-10-CM | POA: Diagnosis not present

## 2023-12-02 DIAGNOSIS — M5416 Radiculopathy, lumbar region: Secondary | ICD-10-CM

## 2023-12-02 NOTE — Therapy (Signed)
 OUTPATIENT PHYSICAL THERAPY THORACOLUMBAR TREATMENT   Patient Name: ALAUNA HAYDEN MRN: [id number] DOB:November 05, 1955, 68 y.o., female Today's Date: 12/02/2023  END OF SESSION:  PT End of Session - 12/02/23 0805     Visit Number 9    Number of Visits 16    Date for PT Re-Evaluation 12/30/23    Authorization Type [employer] (no auth)    PT Start Time 0803    PT Stop Time 0845    PT Time Calculation (min) 42 min    Activity Tolerance Patient tolerated treatment well    Behavior During Therapy [facility] for tasks assessed/performed               Past Medical History:  Diagnosis Date   Adrenal insufficiency (HCC)    occured following steroid shoulder injections ~ spring 2017   Anxiety    pt. reports that she has RLS- takes Xanax for calming her self so she can settle & sleep    Arthritis    shoulder & back, knees & leg     Asthma    aleery related   Atypical mole 11/12/2020   mod-severe- mid back   Chest pain, atypical    had cardiac workup - deemed related to anxiety   Chronic bronchitis (HCC)    "less since I quit smoking" (02/21/2016)   Chronic lower back pain    Dyspnea    related to spray paint that is put in  beer cans at her place of employment.  Pt. also reports that she has anxiety also that leads to SOB., Pt. reports her Mother just died a week ago     GERD (gastroesophageal reflux disease)    Guillain-Barre syndrome following vaccination (HCC) 1970's   post swine flu shot, hosp. for treatment for 1 month    H/O dizziness    H/O hiatal hernia    Hepatitis    hepatitis as a child; "got it from a little girl at school" make sick and vomiting   Herpes simplex    History of Guillain-Barre syndrome ~ 1978   "from the swine flu shot"   IBS (irritable bowel syndrome)    Pneumonia    PONV (postoperative nausea and vomiting)    Restless leg syndrome    Walking pneumonia ~ 2011   Past Surgical History:  Procedure Laterality Date   56 HOUR PH STUDY N/A  09/23/2016   Procedure: 24 HOUR PH STUDY;  Surgeon: Napoleon Form, MD;  Location: [facility];  Service: Endoscopy;  Laterality: N/A;   ANTERIOR CERVICAL DECOMP/DISCECTOMY FUSION  2000 & 2015   two times ago- C5 and C6, Dr. Clydene Fake, MD 2015   APPENDECTOMY  1980s   BACK SURGERY     BIOPSY  03/24/2022   Procedure: BIOPSY;  Surgeon: Dolores Frame, MD;  [facility]: [facility];  Service: Gastroenterology;;   BREAST BIOPSY Right    benign   CHOLECYSTECTOMY OPEN  1980s   COLONOSCOPY N/A 09/20/2014   rehman: Prep was excellent except she had thick layer of stool coating cecal and ascending colon mucosa. Cecal landmarks were well identified after vigorous washing. Small lymphoid polyp ablated via cold biopsy from appendiceal stump.Mucosa of rest of the colon was normal. Normal mucosa of rectum and anorectal junction.   ESOPHAGEAL DILATION N/A 05/29/2019   Procedure: ESOPHAGEAL DILATION;  Surgeon: Malissa Hippo, MD;  [facility];  Service: Endoscopy;  Laterality: N/A;   ESOPHAGEAL MANOMETRY N/A 09/23/2016  Procedure: ESOPHAGEAL MANOMETRY (EM);  Surgeon: Napoleon Form, MD;  [facility]: [facility];  Service: Endoscopy;  Laterality: N/A;   ESOPHAGOGASTRODUODENOSCOPY N/A 09/20/2014   Procedure: ESOPHAGOGASTRODUODENOSCOPY (EGD);  Surgeon: Malissa Hippo, MD;  [facility];  Service: Endoscopy;  Laterality: N/A;   ESOPHAGOGASTRODUODENOSCOPY (EGD) WITH PROPOFOL N/A 05/29/2019   - Z-line, 38 cm from the incisors.   ESOPHAGOGASTRODUODENOSCOPY (EGD) WITH PROPOFOL N/A 03/24/2022   Procedure: ESOPHAGOGASTRODUODENOSCOPY (EGD) WITH PROPOFOL;  Surgeon: Dolores Frame, MD;  [facility];  Service: Gastroenterology;  Laterality: N/A;  730 ASA 1   HIATAL HERNIA REPAIR N/A 11/27/2016   Procedure: LAPAROSCOPIC REPAIR OF HIATAL HERNIA WITH NISSEN FUNDOPLICATION;  Surgeon: Avel Peace, MD;  Location: [facility];  Service: General;  Laterality:  N/A;   INCISION AND DRAINAGE OF WOUND Left 02/21/2016   forearm, minor   IR ANGIO INTRA EXTRACRAN SEL INTERNAL CAROTID BILAT MOD SED  11/03/2022   IR ANGIO VERTEBRAL SEL VERTEBRAL UNI L MOD SED  11/03/2022   LUMBAR LAMINECTOMY/DECOMPRESSION MICRODISCECTOMY Right 06/22/2013   Procedure: RIGHT LUMBAR TWO-THREE DISCECTOMY;  Surgeon: Clydene Fake, MD;  [facility]: [facility];  Service: Neurosurgery;  Laterality: Right;  Right    MALONEY DILATION N/A 09/20/2014   Procedure: Elease Hashimoto DILATION;  Surgeon: Malissa Hippo, MD;  [facility];  Service: Endoscopy;  Laterality: N/A;   MINOR IRRIGATION AND DEBRIDEMENT OF WOUND Left 02/21/2016   Procedure: MINOR IRRIGATION AND DEBRIDEMENT/REPAIR OF LEFT ARM WOUND;  Surgeon: Beverely Low, MD;  Location: [facility];  Service: Orthopedics;  Laterality: Left;   REVERSE SHOULDER ARTHROPLASTY Right 02/21/2016   Procedure: RIGHT REVERSE TOTAL SHOULDER ARTHROPLASTY;  Surgeon: Beverely Low, MD;  Location: [facility];  Service: Orthopedics;  Laterality: Right;   REVERSE TOTAL SHOULDER ARTHROPLASTY Right 02/21/2016   SAVORY DILATION  03/24/2022   Procedure: SAVORY DILATION;  Surgeon: Marguerita Merles, Reuel Boom, MD;  [facility];  Service: Gastroenterology;;   SHOULDER ARTHROSCOPY W/ ROTATOR CUFF REPAIR Right 1990s   TOTAL SHOULDER REPLACEMENT     Right   VAGINAL HYSTERECTOMY     Patient Active Problem List   Diagnosis Date Noted   Elevated LFTs 08/24/2022   Depression 06/18/2022   TIA (transient ischemic attack) 06/17/2022   Chronic pain 06/17/2022   Hyperglycemia 06/17/2022   Anxiety 06/17/2022   Asthma, chronic 06/17/2022   Hypothyroidism 06/17/2022   Restless leg syndrome 06/17/2022   Nausea without vomiting 02/10/2022   Diarrhea 04/09/2021   Pain in right knee 11/23/2019   IBS (irritable bowel syndrome) 06/27/2019   Dysphagia 05/08/2019   Paresthesias 04/27/2019   Hiatal hernia with GERD without esophagitis 11/27/2016    Gastroesophageal reflux disease    Hiatal hernia    S/P shoulder replacement 02/21/2016   DYSPNEA 12/13/2009   CHEST PAIN, ATYPICAL 12/13/2009    PCP: Assunta Found MD  REFERRING PROVIDER: Bedelia Person, MD  REFERRING DIAG: M41.26 (ICD-10-CM) - Other idiopathic scoliosis, lumbar region  Rationale for Evaluation and Treatment: Rehabilitation  THERAPY DIAG:  Difficulty in walking, not elsewhere classified  Scoliosis of lumbar spine, unspecified scoliosis type  Radiculopathy, lumbar region  ONSET DATE: 3 years ago  SUBJECTIVE:  SUBJECTIVE STATEMENT: Reports 5/10 pain in low back. Reports she didn't sleep so good last night.  EVAL: Arrives to the clinic with low back pain (see below). Patient reports that the legs are numb and weak. Condition started for around 3 years without apparent reason. Patient reports that she has had multiple episode of falls. Patient reports that she had back surgery in 2014 and the area above the surgery collapsed. Recently, patient was told by her MD that she will have surgery again but will need physical therapy first to strengthen her back  PERTINENT HISTORY:  Back surgery/fusion in 2014  PAIN:  Are you having pain? Yes: NPRS scale: 7/10 Pain location: low back on the L side, shoots to the L buttocks to the thigh and sometimes to the L calf Pain description: aching, constant Aggravating factors: sitting ~ 30 minutes, walking and standing ~ 30 minutes Relieving factors: Ibuprofen  PRECAUTIONS: None  RED FLAGS: None   WEIGHT BEARING RESTRICTIONS: No  FALLS:  Has patient fallen in last 6 months? Yes. Number of falls 1  LIVING ENVIRONMENT: Lives with: lives with their spouse Lives in: Mobile home Stairs: Yes: External: 4 steps; bilateral but cannot reach  both Has following equipment at home: None  OCCUPATION: retired  PLOF: Independent and Independent with basic ADLs  PATIENT GOALS: "I hope it will help"  NEXT MD VISIT: March 2025  OBJECTIVE:  Note: Objective measures were completed at Evaluation unless otherwise noted.  DIAGNOSTIC FINDINGS:  06/22/23 MRI LUMBAR SPINE WITHOUT CONTRAST   TECHNIQUE: Multiplanar, multisequence MR imaging of the lumbar spine was performed. No intravenous contrast was administered.   COMPARISON:  Films lumbar spine 05/05/2023. MRI lumbar spine 12/21/2017.   FINDINGS: Segmentation:  Standard.   Alignment: As seen on the prior plain films, there is convex left lumbar scoliosis with the apex at L3.   Vertebrae: No fracture, evidence of discitis, or bone lesion. The patient is status post L4-5 discectomy and fusion as seen on the prior studies. There is degenerative endplate signal change eccentric to the right at T12-L1 and L1-2.   Conus medullaris and cauda equina: Conus extends to the T12-L1 level. Conus and cauda equina appear normal.   Paraspinal and other soft tissues: Left renal cyst is unchanged.   Disc levels:   T11-12: This level is incompletely imaged in the sagittal and axial planes. Shallow disc bulge is seen but no stenosis is identified.   T12-L1: Mild-to-moderate facet degenerative disease and a shallow disc bulge to the left. The central canal and right foramen are open. Mild left foraminal narrowing is present.   L1-2: There is loss of disc space height with a broad-based disc bulge and mild-to-moderate facet degenerative disease. Mild to moderate central canal stenosis appears unchanged. The foramina are open.   L2-3: Moderate bilateral facet arthropathy is worse on the right. There is a disc bulge with endplate spurring, more prominent to the right. Moderate narrowing in the right subarticular recess and foramen appears unchanged. The left foramen is open.    L3-4: Disc bulge and endplate spur, ligamentum flavum thickening and facet arthropathy are seen. Moderate central canal stenosis appears worse than on the prior exam. Mild to moderate foraminal narrowing is greater on the right.   L4-5: Status post discectomy and fusion. No stenosis. Small postoperative seroma in the laminectomy bed is unchanged.   L5-S1: Advanced bilateral facet degenerative disease is present no disc bulge or protrusion. No stenosis.   IMPRESSION: 1. Status post L4-5  discectomy and fusion without stenosis. 2. Spondylosis at L3-4 appears worse than on the prior exam. Moderate central canal stenosis is present. Mild to moderate foraminal narrowing is greater on the right. 3. No change in mild to moderate central canal stenosis at L1-2. 4. No change in moderate narrowing in the right subarticular recess and foramen at L2-3.  PATIENT SURVEYS:  Modified Oswestry 28/50 = 56%  11/29/23: 20 / 50 = 40.0 %  COGNITION: Overall cognitive status: Within functional limits for tasks assessed     SENSATION: Light touch: Impaired l LE  MUSCLE LENGTH: Hamstrings: mild restriction on B Thomas test: slight restriction on B Piriformis test: slight restriction on B  POSTURE: rounded shoulders, forward head, and increased lumbar lordosis  PALPATION: No tenderness on major bony landmarks and muscle mass of the lumbar spine  LUMBAR ROM:   AROM eval 11/29/23  Flexion 50% 50% pain on L  Extension 75%* 75%  Right lateral flexion    Left lateral flexion    Right rotation 100% 100%  Left rotation 50% 100%   (Blank rows = not tested) *worsened radicular symptoms.     LOWER EXTREMITY ROM:     Active  Right eval Left eval  Hip flexion [facility]  Hip extension [facility]  Hip abduction [facility]  Hip adduction    Hip internal rotation    Hip external rotation    Knee flexion [facility]  Knee extension [facility]  Ankle dorsiflexion [facility]  Ankle plantarflexion [facility]  Ankle  inversion    Ankle eversion     (Blank rows = not tested)  LOWER EXTREMITY MMT:    MMT Right eval Left eval R  11/29/23 L 11/29/23  Hip flexion 4 4 4/5 4/5  Hip extension 3+ 3+ 4/5 4/5  Hip abduction 4- 4-    Hip adduction      Hip internal rotation      Hip external rotation      Knee flexion 4+ 4+ 4+ 4+  Knee extension 4+ 4+ 4+ 4+  Ankle dorsiflexion 4+ 4+ 5 5  Ankle plantarflexion 4+ 4+    Ankle inversion      Ankle eversion       (Blank rows = not tested)  LUMBAR SPECIAL TESTS:  Straight leg raise test: Positive, FABER test: Negative, and Double knee to chest relieved radicular symptoms  11/29/23: Negative for Leg pain, Pain in L hip and low back  FUNCTIONAL TESTS:  5 times sit to stand: 11.90 sec 2 minute walk test: 566 ft with pain = 6/10  11/29/23: 5xSTS: 12 sec : 509 w/ inc in pain on L side, 5/10  GAIT: Distance walked: 566 ft Assistive device utilized: None Level of assistance: Complete Independence Comments: done during  TREATMENT DATE:  12/02/23 Recumbent bike, 5', seat  STS/Lifting mechanics, 10 lb., 2x10 Shoulder rows, GTB, 2x10 Shoulder ext, GTB, 2x10 Shoulder punches, GTB, 2x10 Pallof Press, GTB, bilat, 2x10  Wall squat, 30" holdx3 Quadruped, Bird Dog, 2x10, v cues to form  11/29/2023 Progress Note:  -LE MMT -Lumbar ROM -5xSTS - test -Mod Oswestry  Supine with heat: LTR, 2x10 Marches, 2x10  Pt educ for Body Mechanics during func tasks  11/25/23: [employer] x 5' dynamic warm up level 4 seat 8 Standing: Marching 10x 5" Squat front of chair 2x 10 cueing for eccentric control Sidestep with RTB around thigh 3RT Lunges onto 4" step no UE 2X10 each side Rows RTB  2X10 Shoulder extension RTB 2X10 Pallof RTB with normal BOS 2X10 each side Vectors 5X5"                                                                      PATIENT EDUCATION:  Education details: Educated on the pathoanatomy of low back pain. Educated  on the goals and course of rehab.  Person educated: Patient Education method: Explanation Education comprehension: verbalized understanding  HOME EXERCISE PROGRAM: Access Code: [email or web address] URL: [email or web address] Date: 12/02/2023 Prepared by: Fabiola Backer Powell-Butler  Exercises - Seated Hamstring Stretch  - 1-2 x daily - 5-7 x weekly - 3 reps - 30 hold - Supine Posterior Pelvic Tilt  - 1-2 x daily - 5-7 x weekly - 2 sets - 10 reps - 3 hold - Supine Double Knee to Chest  - 1-2 x daily - 5-7 x weekly - 5 reps - 15 hold - Supine Active Straight Leg Raise  - 1-2 x daily - 5-7 x weekly - 2 sets - 10 reps - Seated Abdominal Press into [employer]  - 1-2 x daily - 5-7 x weekly - 2 sets - 10 reps - 3 hold - Supine Bridge  - 1-2 x daily - 7 x weekly - 3 sets - 10 reps - Clam with Resistance  - 1-2 x daily - 7 x weekly - 3 sets - 10 reps - 5" hold - Supine Shoulder Horizontal Abduction with Resistance  - 1 x daily - 7 x weekly - 2 sets - 8 reps - [place]   - 1 x daily - 7 x weekly - 3 sets - 10 reps - Sidelying Femoral Nerve Glide - Top Leg  - 1 x daily - 7 x weekly - 3 sets - 10 reps - Hooklying Hamstring Stretch with Strap  - 2 x daily - 7 x weekly - 1 sets - 3 reps - 30" hold - Standing 3-Way Kick  - 2 x daily - 2 sets - 5 reps - 5 sec hold - Seated Piriformis Stretch with Trunk Bend  - 2 x daily - 2 sets - 3 reps - 30 sec hold - Bird Dog  - 1 x daily - 7 x weekly - 3 sets - 10 reps - Wall Squat  - 1 x daily - 7 x weekly - 3 sets - 10 reps   ASSESSMENT:  CLINICAL IMPRESSION: Patient tolerates session well with appropriate amount of fatigue. Session began with warm up on recumbent bike. Remainder of session focused on core strengthening in standing, and quadruped positions with patient with good carry over for form. Patient reports daily intermittent low back pain and radiating symptoms but overall improvement. Still some limitations with core strength, and endurance as well.  Patient will benefit from continued skilled physical therapy in order to address the impairments and functional limitations listed below.   Continued with focus on standing functional strengthening exercises for gluteal and core.  Added postural strengthening as well as core stabilization using theraband.  Added vectors (3 way kick) for glut stabilization and finished up with instruction for seated piriformis stretch.  Good results from this stretch.  Updated HEP to include vectors and piriformis stretch. Min cueing for core activation and general  form/hold times.  No pain during session with reports pain reduced but "pinching" at end of session.    OBJECTIVE IMPAIRMENTS: decreased activity tolerance, difficulty walking, decreased ROM, decreased strength, impaired flexibility, postural dysfunction, and pain.   ACTIVITY LIMITATIONS: carrying, lifting, bending, sitting, standing, squatting, and hygiene/grooming  PARTICIPATION LIMITATIONS: meal prep, cleaning, laundry, driving, shopping, community activity, and yard work  PERSONAL FACTORS: Time since onset of injury/illness/exacerbation and 1 comorbidity: hx of low back surgery  are also affecting patient's functional outcome.   REHAB POTENTIAL: Fair    CLINICAL DECISION MAKING: Stable/uncomplicated  EVALUATION COMPLEXITY: Low   GOALS: Goals reviewed with patient? Yes  SHORT TERM GOALS: Target date: 11/17/23  Pt will demonstrate indep in HEP to facilitate carry-over of skilled services and improve functional outcomes Goal status: MET  LONG TERM GOALS: Target date: 4/24//25  Pt will demonstrate a decrease in modified ODI score by 13-14% to demonstrate significant improvement in ADLs Baseline: 56% Goal status: IN PROGRESS  2.  Pt will decrease 5TSTS by at least 3 seconds in order to demonstrate clinically significant improvement in LE strength  Baseline: 11.90 sec Goal status: IN PROGRESS  3.  Pt will be able to do > or = to  566 ft with slight to mild pain (3-4/10) in order to demonstrate clinically significant improvement in community ambulation Baseline: 566 ft with 6/10 pain Goal status: IN PROGRESS  4.  Pt will demonstrate increase in LE strength to 4-/5 to facilitate ease and safety in ambulation Baseline: 3+/5 Goal status: MET  5.  Pt will be able to tolerate > 30 minutes of sitting with slight to mild pain (2-4/10) to facilitate ease in ADLs Baseline: see above Goal status: INITIAL  PLAN:  PT FREQUENCY: 2x/week  PT DURATION: 4 weeks  PLANNED INTERVENTIONS: 97164- PT Re-evaluation, 97110-Therapeutic exercises, 97530- Therapeutic activity, 97112- Neuromuscular re-education, 97535- Self Care, 16109- Manual therapy, 97116- Gait training, Patient/Family education, Taping, Dry Needling, Cryotherapy, and Moist heat.  PLAN FOR NEXT SESSION: Continue POC and may progress as tolerated with emphasis on LE flexibility, core and hip strength; sees her MD at the end of the month.  Update HEP to include postural exercises and pallof with theraband     8:52 AM, 12/02/23 Chryl Heck, PT, DPT [facility]

## 2023-12-03 DIAGNOSIS — Z6825 Body mass index (BMI) 25.0-25.9, adult: Secondary | ICD-10-CM | POA: Diagnosis not present

## 2023-12-03 DIAGNOSIS — M48061 Spinal stenosis, lumbar region without neurogenic claudication: Secondary | ICD-10-CM | POA: Diagnosis not present

## 2023-12-03 DIAGNOSIS — M4126 Other idiopathic scoliosis, lumbar region: Secondary | ICD-10-CM | POA: Diagnosis not present

## 2023-12-09 DIAGNOSIS — J3089 Other allergic rhinitis: Secondary | ICD-10-CM | POA: Diagnosis not present

## 2023-12-09 DIAGNOSIS — J301 Allergic rhinitis due to pollen: Secondary | ICD-10-CM | POA: Diagnosis not present

## 2023-12-15 DIAGNOSIS — J3081 Allergic rhinitis due to animal (cat) (dog) hair and dander: Secondary | ICD-10-CM | POA: Diagnosis not present

## 2023-12-15 DIAGNOSIS — J301 Allergic rhinitis due to pollen: Secondary | ICD-10-CM | POA: Diagnosis not present

## 2023-12-15 DIAGNOSIS — J3089 Other allergic rhinitis: Secondary | ICD-10-CM | POA: Diagnosis not present

## 2023-12-22 DIAGNOSIS — J3089 Other allergic rhinitis: Secondary | ICD-10-CM | POA: Diagnosis not present

## 2023-12-22 DIAGNOSIS — J301 Allergic rhinitis due to pollen: Secondary | ICD-10-CM | POA: Diagnosis not present

## 2023-12-22 DIAGNOSIS — J3081 Allergic rhinitis due to animal (cat) (dog) hair and dander: Secondary | ICD-10-CM | POA: Diagnosis not present

## 2023-12-30 DIAGNOSIS — J3089 Other allergic rhinitis: Secondary | ICD-10-CM | POA: Diagnosis not present

## 2023-12-30 DIAGNOSIS — J301 Allergic rhinitis due to pollen: Secondary | ICD-10-CM | POA: Diagnosis not present

## 2023-12-30 DIAGNOSIS — J3081 Allergic rhinitis due to animal (cat) (dog) hair and dander: Secondary | ICD-10-CM | POA: Diagnosis not present

## 2023-12-31 DIAGNOSIS — M4126 Other idiopathic scoliosis, lumbar region: Secondary | ICD-10-CM | POA: Diagnosis not present

## 2023-12-31 DIAGNOSIS — N958 Other specified menopausal and perimenopausal disorders: Secondary | ICD-10-CM | POA: Diagnosis not present

## 2023-12-31 DIAGNOSIS — Z7952 Long term (current) use of systemic steroids: Secondary | ICD-10-CM | POA: Diagnosis not present

## 2024-01-05 DIAGNOSIS — J301 Allergic rhinitis due to pollen: Secondary | ICD-10-CM | POA: Diagnosis not present

## 2024-01-05 DIAGNOSIS — J3081 Allergic rhinitis due to animal (cat) (dog) hair and dander: Secondary | ICD-10-CM | POA: Diagnosis not present

## 2024-01-05 DIAGNOSIS — J3089 Other allergic rhinitis: Secondary | ICD-10-CM | POA: Diagnosis not present

## 2024-01-06 ENCOUNTER — Encounter (INDEPENDENT_AMBULATORY_CARE_PROVIDER_SITE_OTHER): Payer: Self-pay

## 2024-01-06 ENCOUNTER — Encounter (INDEPENDENT_AMBULATORY_CARE_PROVIDER_SITE_OTHER): Payer: Self-pay | Admitting: Gastroenterology

## 2024-01-06 ENCOUNTER — Ambulatory Visit (INDEPENDENT_AMBULATORY_CARE_PROVIDER_SITE_OTHER): Admitting: Gastroenterology

## 2024-01-06 VITALS — BP 124/73 | HR 61 | Temp 97.9°F | Ht 67.0 in | Wt 156.8 lb

## 2024-01-06 DIAGNOSIS — K219 Gastro-esophageal reflux disease without esophagitis: Secondary | ICD-10-CM

## 2024-01-06 DIAGNOSIS — K580 Irritable bowel syndrome with diarrhea: Secondary | ICD-10-CM

## 2024-01-06 DIAGNOSIS — R110 Nausea: Secondary | ICD-10-CM

## 2024-01-06 DIAGNOSIS — R140 Abdominal distension (gaseous): Secondary | ICD-10-CM

## 2024-01-06 DIAGNOSIS — K581 Irritable bowel syndrome with constipation: Secondary | ICD-10-CM

## 2024-01-06 NOTE — Progress Notes (Signed)
 Referring Provider: Minus Amel, MD Primary Care Physician:  Minus Amel, MD Primary GI Physician: Dr. Sammi Crick   Chief Complaint  Patient presents with   Gastroesophageal Reflux    Follow up on GERD and dysphagia and nausea. States she is about the same as last visit. Takes nexium  just once a day in the mornings. Script is wrote for twice daily. Has zofran  for nausea and says it helps sometimes.    HPI:   Kathryn [name] is a 68 y.o. female with past medical history of anxiety, asthma, GERD, Guillian Barre syndrome, IBS   Patient presenting today for:  Follow up of GERD, nausea and IBS  Last seen December 2024, at that time regurgitation was better, having burning in her stomach.  Nausea but no vomiting.  Take ibuprofen  800 mg daily for chronic back pain.  Recently on doxycycline.  Recommended to continue with nexium  40mg  daily, carafate  1g QID, pt to make me aware if she wishes to proceed with GES, zofran  PRN  Present: Feels GERD mostly well controlled as far as heartburn, though notes some intermittent regurgitation at times. Seems to depend what she eats that can cause these symptoms, notes it does not occur frequently. Previous Ph impedence testing in 2018 showed reflux. She is still using carafate  PRN which she thinks helps. No dysphagia or odynophagia.    she is still having nausea a lot, more at night. No vomiting, just dry heaving. Taking zofran  PRN but unsure if this helps. She notes during these episodes when nausea gets really bad she will feel very cold and have to go lay down. Does not feel that any certain foods bring on her nausea, usually having nausea daily though sometimes she can go up to a week without it. Having some bloating as well.   She notes she feels hungry often, appetite is good. No early satiety. No rectal bleeding or melena.   Had some diarrhea, usually when eating certain things such as salad. She reports a lot of foul smelling gas. She has some  abdominal cramping at times that usually gets better if she can move her bowels. Notes some LLQ pain recently as well. Denies rectal bleeding or melena.   Ph impedence testing/Manometry: 2018 evidence of reflux/manometry was normal  DG esophagus: 2020 age related esophageal dysmotility  Last EGD: 03/24/2022 which showed a 1 cm hiatal hernia and presence of a prior Nissen fundoplication with a loose wrap, no other abnormalities were found in the esophageal lumen.  The esophagus was empirically dilated with an 18 mm Savary dilator. Biopsies mid and distal esophagus were unremarkable  Last Colonoscopy: 2016 Prep was excellent except she had thick layer of stool coating cecal and ascending colon mucosa. Cecal landmarks were well identified after vigorous washing. Small polyp ablated via cold biopsy from appendiceal stump. Mucosa of rest of the colon was normal. Normal mucosa of rectum and anorectal junction  Repeat colonoscopy 10 years  Filed Weights   01/06/24 0850  Weight: 156 lb 12.8 oz (71.1 kg)     Past Medical History:  Diagnosis Date   Adrenal insufficiency (HCC)    occured following steroid shoulder injections ~ spring 2017   Anxiety    pt. reports that she has RLS- takes Xanax  for calming her self so she can settle & sleep    Arthritis    shoulder & back, knees & leg     Asthma    aleery related   Atypical mole 11/12/2020  mod-severe- mid back   Chest pain, atypical    had cardiac workup - deemed related to anxiety   Chronic bronchitis (HCC)    "less since I quit smoking" (02/21/2016)   Chronic lower back pain    Dyspnea    related to spray paint that is put in  beer cans at her place of employment.  Pt. also reports that she has anxiety also that leads to SOB., Pt. reports her Mother just died a week ago     GERD (gastroesophageal reflux disease)    Guillain-Barre syndrome following vaccination (HCC) 1970's   post swine flu shot, hosp. for treatment for 1 month    H/O  dizziness    H/O hiatal hernia    Hepatitis    hepatitis as a child; "got it from a little girl at school" make sick and vomiting   Herpes simplex    History of Guillain-Barre syndrome ~ 1978   "from the swine flu shot"   IBS (irritable bowel syndrome)    Pneumonia    PONV (postoperative nausea and vomiting)    Restless leg syndrome    Walking pneumonia ~ 2011    Past Surgical History:  Procedure Laterality Date   67 HOUR PH STUDY N/A 09/23/2016   Procedure: 24 HOUR PH STUDY;  Surgeon: Sergio Dandy, MD;  Location: [facility];  Service: Endoscopy;  Laterality: N/A;   ANTERIOR CERVICAL DECOMP/DISCECTOMY FUSION  2000 & 2015   two times ago- C5 and C6, Dr. Shary Deems, MD 2015   APPENDECTOMY  1980s   BACK SURGERY     BIOPSY  03/24/2022   Procedure: BIOPSY;  Surgeon: Urban Garden, MD;  Location: [facility];  Service: Gastroenterology;;   BREAST BIOPSY Right    benign   CHOLECYSTECTOMY OPEN  1980s   COLONOSCOPY N/A 09/20/2014   rehman: Prep was excellent except she had thick layer of stool coating cecal and ascending colon mucosa. Cecal landmarks were well identified after vigorous washing. Small lymphoid polyp ablated via cold biopsy from appendiceal stump.Mucosa of rest of the colon was normal. Normal mucosa of rectum and anorectal junction.   ESOPHAGEAL DILATION N/A 05/29/2019   Procedure: ESOPHAGEAL DILATION;  Surgeon: Ruby Corporal, MD;  Location: [facility];  Service: Endoscopy;  Laterality: N/A;   ESOPHAGEAL MANOMETRY N/A 09/23/2016   Procedure: ESOPHAGEAL MANOMETRY (EM);  Surgeon: Sergio Dandy, MD;  Location: [facility];  Service: Endoscopy;  Laterality: N/A;   ESOPHAGOGASTRODUODENOSCOPY N/A 09/20/2014   Procedure: ESOPHAGOGASTRODUODENOSCOPY (EGD);  Surgeon: Ruby Corporal, MD;  [facility]: [facility];  Service: Endoscopy;  Laterality: N/A;   ESOPHAGOGASTRODUODENOSCOPY (EGD) WITH PROPOFOL  N/A 05/29/2019   - Z-line, 38 cm from the  incisors.   ESOPHAGOGASTRODUODENOSCOPY (EGD) WITH PROPOFOL  N/A 03/24/2022   Procedure: ESOPHAGOGASTRODUODENOSCOPY (EGD) WITH PROPOFOL ;  Surgeon: Urban Garden, MD;  [facility]: [facility];  Service: Gastroenterology;  Laterality: N/A;  730 ASA 1   HIATAL HERNIA REPAIR N/A 11/27/2016   Procedure: LAPAROSCOPIC REPAIR OF HIATAL HERNIA WITH NISSEN FUNDOPLICATION;  Surgeon: Adalberto Hollow, MD;  Location: [facility];  Service: General;  Laterality: N/A;   INCISION AND DRAINAGE OF WOUND Left 02/21/2016   forearm, minor   IR ANGIO INTRA EXTRACRAN SEL INTERNAL CAROTID BILAT MOD SED  11/03/2022   IR ANGIO VERTEBRAL SEL VERTEBRAL UNI L MOD SED  11/03/2022   LUMBAR LAMINECTOMY/DECOMPRESSION MICRODISCECTOMY Right 06/22/2013   Procedure: RIGHT LUMBAR TWO-THREE DISCECTOMY;  Surgeon: Shary Deems, MD;  Location: [facility];  Service: Neurosurgery;  Laterality: Right;  Right    MALONEY DILATION N/A 09/20/2014   Procedure: Londa Rival DILATION;  Surgeon: Ruby Corporal, MD;  [facility]: [facility];  Service: Endoscopy;  Laterality: N/A;   MINOR IRRIGATION AND DEBRIDEMENT OF WOUND Left 02/21/2016   Procedure: MINOR IRRIGATION AND DEBRIDEMENT/REPAIR OF LEFT ARM WOUND;  Surgeon: Winston Hawking, MD;  Location: [facility];  Service: Orthopedics;  Laterality: Left;   REVERSE SHOULDER ARTHROPLASTY Right 02/21/2016   Procedure: RIGHT REVERSE TOTAL SHOULDER ARTHROPLASTY;  Surgeon: Winston Hawking, MD;  [facility];  Service: Orthopedics;  Laterality: Right;   REVERSE TOTAL SHOULDER ARTHROPLASTY Right 02/21/2016   SAVORY DILATION  03/24/2022   Procedure: SAVORY DILATION;  Surgeon: Umberto Ganong, Bearl Limes, MD;  Location: [facility];  Service: Gastroenterology;;   SHOULDER ARTHROSCOPY W/ ROTATOR CUFF REPAIR Right 1990s   TOTAL SHOULDER REPLACEMENT     Right   VAGINAL HYSTERECTOMY      Current Outpatient Medications  Medication Sig Dispense Refill   acetaminophen  (TYLENOL ) 500 MG tablet Take 500-1,000 mg by  mouth every 6 (six) hours as needed (For pain.).     albuterol  (PROVENTIL  HFA;VENTOLIN  HFA) 108 (90 Base) MCG/ACT inhaler Inhale 2 puffs into the lungs every 4 (four) hours as needed for wheezing or shortness of breath.     ALPRAZolam  (XANAX ) 0.5 MG tablet Take 0.5 mg by mouth daily as needed for anxiety.     Biotin  10000 MCG TABS Take 10,000 mcg by mouth daily.      Cholecalciferol  (VITAMIN D -3) 125 MCG (5000 UT) TABS Take 5,000 Units by mouth daily.      cyanocobalamin  (VITAMIN B12) 1000 MCG tablet Take 1 tablet (1,000 mcg total) by mouth daily. 30 tablet 2   EPINEPHrine  0.3 mg/0.3 mL IJ SOAJ injection Inject 0.3 mg into the skin as needed for anaphylaxis.     escitalopram  (LEXAPRO ) 20 MG tablet Take 20 mg by mouth daily.     esomeprazole  (NEXIUM ) 40 MG capsule TAKE 1 CAPSULE(40 MG) BY MOUTH TWICE DAILY BEFORE A MEAL 90 capsule 0   fluticasone  (FLONASE ) 50 MCG/ACT nasal spray Place 2 sprays into both nostrils daily as needed for allergies or rhinitis.     GABAPENTIN PO Take by mouth. 100mg  at night     ibuprofen  (ADVIL ) 800 MG tablet Take 1 tablet (800 mg total) by mouth every 8 (eight) hours as needed. 90 tablet 0   levothyroxine  (SYNTHROID ) 25 MCG tablet Take 25 mcg by mouth daily before breakfast.     loratadine  (CLARITIN ) 10 MG tablet Take 10 mg by mouth daily.     montelukast  (SINGULAIR ) 10 MG tablet Take 10 mg by mouth every morning.      ondansetron  (ZOFRAN ) 4 MG tablet Take 1 tablet (4 mg total) by mouth every 8 (eight) hours as needed for nausea or vomiting. 30 tablet 1   rOPINIRole  (REQUIP ) 4 MG tablet Take 6 mg by mouth daily. 4 - 6 mg as needed     rosuvastatin  (CRESTOR ) 5 MG tablet Take 1 tablet (5 mg total) by mouth at bedtime. 90 tablet 3   sucralfate  (CARAFATE ) 1 g tablet TAKE 1 TABLET(1 GRAM) BY MOUTH FOUR TIMES DAILY AT BEDTIME WITH MEALS 120 tablet 1   valACYclovir  (VALTREX ) 1000 MG tablet Take 1,000 mg by mouth daily.     No current facility-administered medications for  this visit.    Allergies as of 01/06/2024 - Review Complete 12/02/2023  Allergen Reaction  Noted   Niacin and related Shortness Of Breath, Swelling, and Other (See Comments) 06/14/2013   Codeine Swelling and Other (See Comments) 06/14/2013   Zinc Swelling 02/07/2016    Social History   Socioeconomic History   Marital status: Married    Spouse name: Not on file   Number of children: Not on file   Years of education: Not on file   Highest education level: Not on file  Occupational History   Occupation: Production manager  Tobacco Use   Smoking status: Former    Current packs/day: 0.00    Average packs/day: 3.0 packs/day for 10.0 years (30.0 ttl pk-yrs)    Types: Cigarettes    Start date: 05/08/1989    Quit date: 05/09/1999    Years since quitting: 24.6    Passive exposure: Current   Smokeless tobacco: Never   Tobacco comments:    "quit smoking in ~ 2003"  Vaping Use   Vaping status: Never Used  Substance and Sexual Activity   Alcohol  use: No    Comment: 02/21/2016 "I used to drink a little bit; probably quit in my 20s"   Drug use: No   Sexual activity: Yes    Birth control/protection: Surgical    Comment: hyst  Other Topics Concern   Not on file  Social History Narrative   Walks but no other activity    Anxiety   Right handed   Caffeine-64oz daily   Social Drivers of Corporate investment banker Strain: Not on file  Food Insecurity: Not on file  Transportation Needs: Not on file  Physical Activity: Not on file  Stress: Not on file  Social Connections: Not on file    Review of systems General: negative for malaise, night sweats, fever, chills, weight loss Neck: Negative for lumps, goiter, pain and significant neck swelling Resp: Negative for cough, wheezing, dyspnea at rest CV: Negative for chest pain, leg swelling, palpitations, orthopnea GI: denies melena, hematochezia, vomiting, constipation, dysphagia, odyonophagia, early satiety or unintentional weight loss.  +diarrhea +lower abdominal cramping +nausea +regurgitation The remainder of the review of systems is noncontributory.  Physical Exam: BP 124/73   Pulse 61   Temp 97.9 F (36.6 C) (Oral)   Ht 5\' 7"  (1.702 m)   Wt 156 lb 12.8 oz (71.1 kg)   BMI 24.56 kg/m  General:   Alert and oriented. No distress noted. Pleasant and cooperative.  Head:  Normocephalic and atraumatic. Eyes:  Conjuctiva clear without scleral icterus. Mouth:  Oral mucosa pink and moist. Good dentition. No lesions. Heart: Normal rate and rhythm, s1 and s2 heart sounds present.  Lungs: Clear lung sounds in all lobes. Respirations equal and unlabored. Abdomen:  +BS, soft, non-tender and non-distended. No rebound or guarding. No HSM or masses noted. Neurologic:  Alert and  oriented x4 Psych:  Alert and cooperative. Normal mood and affect.  Invalid input(s): "6 MONTHS"   ASSESSMENT: Kathryn [name] is a 68 y.o. female presenting today for follow up of GERD, IBS and nausea  GERD: intermittent regurgitation though improved from previously. Had ph impedence testing in 2018 with evidence of reflux, manometry normal at that time. On nexium  40mg  daily, some improvement with use of carafate  PRN as well. Was on PPI BID previously but did not feel this improved her symptoms any more than once daily dosing. For now will continue with PPI daily, carafate  1g PRN, strict reflux precautions.   IBS: some diarrhea when eating salads/other vegetables, likely due to higher fiber  content. No rectal bleeding or melena. Lower abdominal cramping that improves with defecation. Feels diarrhea has improved recently. Also noting more gas at times. If she continues to have symptoms, may consider course of xifaxan.   Nausea: ongoing for the past few years, previous EGD without findings to explain nausea. No vomiting due to previous nissen fundoplication. Reassuringly her appetite is good. No weight loss. Has some bloating at times. Discussed GES in the  past which she declined but is amenable to proceed with GES at this time. Can continue to use zofran  in the meantime.    PLAN:  -continue nexium  40mg  daily -continue carafate  1g PRN  -good reflux precautions -continue zofran  PRN, hold 2 weeks prior to GES -schedule GES  All questions were answered, patient verbalized understanding and is in agreement with plan as outlined above.    Follow Up: 4 months   Lennyx Verdell L. Adrien Alberta, MSN, APRN, AGNP-C Adult-Gerontology Nurse Practitioner [facility]  I have reviewed the note and agree with the APP's assessment as described in this progress note  Samantha Cress, MD Gastroenterology and Hepatology [facility]

## 2024-01-06 NOTE — Patient Instructions (Addendum)
-  continue nexium  40mg  daily -continue carafate  1g as needed  -good reflux precautions to include avoiding greasy, spicy, tomato/citrus based foods, caffeine, chocolate, stay upright 2-3 hours after eating prior to laying down  -continue zofran  as needed for nausea  -schedule GES to evaluate nausea further, need to avoid zofran  2 weeks prior to testing   Follow up 4 months

## 2024-01-13 ENCOUNTER — Other Ambulatory Visit (INDEPENDENT_AMBULATORY_CARE_PROVIDER_SITE_OTHER): Payer: Self-pay | Admitting: Gastroenterology

## 2024-01-13 NOTE — Telephone Encounter (Signed)
 Seen 01/06/2024 by Joie Narrow, at the office visit patient reported she takes once per day.

## 2024-01-14 DIAGNOSIS — J3089 Other allergic rhinitis: Secondary | ICD-10-CM | POA: Diagnosis not present

## 2024-01-14 DIAGNOSIS — J3081 Allergic rhinitis due to animal (cat) (dog) hair and dander: Secondary | ICD-10-CM | POA: Diagnosis not present

## 2024-01-14 DIAGNOSIS — J301 Allergic rhinitis due to pollen: Secondary | ICD-10-CM | POA: Diagnosis not present

## 2024-01-20 DIAGNOSIS — J3089 Other allergic rhinitis: Secondary | ICD-10-CM | POA: Diagnosis not present

## 2024-01-20 DIAGNOSIS — J3081 Allergic rhinitis due to animal (cat) (dog) hair and dander: Secondary | ICD-10-CM | POA: Diagnosis not present

## 2024-01-20 DIAGNOSIS — J301 Allergic rhinitis due to pollen: Secondary | ICD-10-CM | POA: Diagnosis not present

## 2024-01-27 DIAGNOSIS — J3089 Other allergic rhinitis: Secondary | ICD-10-CM | POA: Diagnosis not present

## 2024-01-27 DIAGNOSIS — J3081 Allergic rhinitis due to animal (cat) (dog) hair and dander: Secondary | ICD-10-CM | POA: Diagnosis not present

## 2024-01-27 DIAGNOSIS — J301 Allergic rhinitis due to pollen: Secondary | ICD-10-CM | POA: Diagnosis not present

## 2024-02-03 ENCOUNTER — Encounter (HOSPITAL_COMMUNITY)
Admission: RE | Admit: 2024-02-03 | Discharge: 2024-02-03 | Disposition: A | Discharge status: Home / Self Care | Source: Ambulatory Visit | Attending: Gastroenterology | Admitting: Gastroenterology

## 2024-02-03 ENCOUNTER — Ambulatory Visit (INDEPENDENT_AMBULATORY_CARE_PROVIDER_SITE_OTHER): Payer: Self-pay | Admitting: Gastroenterology

## 2024-02-03 ENCOUNTER — Encounter (HOSPITAL_COMMUNITY): Payer: Self-pay

## 2024-02-03 DIAGNOSIS — J301 Allergic rhinitis due to pollen: Secondary | ICD-10-CM | POA: Diagnosis not present

## 2024-02-03 DIAGNOSIS — R110 Nausea: Secondary | ICD-10-CM | POA: Diagnosis not present

## 2024-02-03 DIAGNOSIS — J3089 Other allergic rhinitis: Secondary | ICD-10-CM | POA: Diagnosis not present

## 2024-02-03 DIAGNOSIS — R140 Abdominal distension (gaseous): Secondary | ICD-10-CM | POA: Insufficient documentation

## 2024-02-03 DIAGNOSIS — J3081 Allergic rhinitis due to animal (cat) (dog) hair and dander: Secondary | ICD-10-CM | POA: Diagnosis not present

## 2024-02-03 MED ORDER — TECHNETIUM TC 99M SULFUR COLLOID
2.0000 | Freq: Once | INTRAVENOUS | Status: AC | PRN
  Administered 2024-02-03: 2 via ORAL

## 2024-02-08 DIAGNOSIS — J300 Vasomotor rhinitis: Secondary | ICD-10-CM | POA: Diagnosis not present

## 2024-02-08 DIAGNOSIS — J3089 Other allergic rhinitis: Secondary | ICD-10-CM | POA: Diagnosis not present

## 2024-02-08 DIAGNOSIS — J301 Allergic rhinitis due to pollen: Secondary | ICD-10-CM | POA: Diagnosis not present

## 2024-02-08 DIAGNOSIS — J3081 Allergic rhinitis due to animal (cat) (dog) hair and dander: Secondary | ICD-10-CM | POA: Diagnosis not present

## 2024-02-08 DIAGNOSIS — J4530 Mild persistent asthma, uncomplicated: Secondary | ICD-10-CM | POA: Diagnosis not present

## 2024-02-16 DIAGNOSIS — J3081 Allergic rhinitis due to animal (cat) (dog) hair and dander: Secondary | ICD-10-CM | POA: Diagnosis not present

## 2024-02-16 DIAGNOSIS — J301 Allergic rhinitis due to pollen: Secondary | ICD-10-CM | POA: Diagnosis not present

## 2024-02-16 DIAGNOSIS — J3089 Other allergic rhinitis: Secondary | ICD-10-CM | POA: Diagnosis not present

## 2024-02-23 DIAGNOSIS — J3081 Allergic rhinitis due to animal (cat) (dog) hair and dander: Secondary | ICD-10-CM | POA: Diagnosis not present

## 2024-02-23 DIAGNOSIS — J301 Allergic rhinitis due to pollen: Secondary | ICD-10-CM | POA: Diagnosis not present

## 2024-02-23 DIAGNOSIS — J3089 Other allergic rhinitis: Secondary | ICD-10-CM | POA: Diagnosis not present

## 2024-03-01 DIAGNOSIS — J3089 Other allergic rhinitis: Secondary | ICD-10-CM | POA: Diagnosis not present

## 2024-03-01 DIAGNOSIS — J301 Allergic rhinitis due to pollen: Secondary | ICD-10-CM | POA: Diagnosis not present

## 2024-03-01 DIAGNOSIS — J3081 Allergic rhinitis due to animal (cat) (dog) hair and dander: Secondary | ICD-10-CM | POA: Diagnosis not present

## 2024-03-03 DIAGNOSIS — M4126 Other idiopathic scoliosis, lumbar region: Secondary | ICD-10-CM | POA: Diagnosis not present

## 2024-03-03 DIAGNOSIS — M48061 Spinal stenosis, lumbar region without neurogenic claudication: Secondary | ICD-10-CM | POA: Diagnosis not present

## 2024-03-09 DIAGNOSIS — J3089 Other allergic rhinitis: Secondary | ICD-10-CM | POA: Diagnosis not present

## 2024-03-09 DIAGNOSIS — J3081 Allergic rhinitis due to animal (cat) (dog) hair and dander: Secondary | ICD-10-CM | POA: Diagnosis not present

## 2024-03-09 DIAGNOSIS — J301 Allergic rhinitis due to pollen: Secondary | ICD-10-CM | POA: Diagnosis not present

## 2024-03-16 DIAGNOSIS — J301 Allergic rhinitis due to pollen: Secondary | ICD-10-CM | POA: Diagnosis not present

## 2024-03-16 DIAGNOSIS — E039 Hypothyroidism, unspecified: Secondary | ICD-10-CM | POA: Diagnosis not present

## 2024-03-16 DIAGNOSIS — E559 Vitamin D deficiency, unspecified: Secondary | ICD-10-CM | POA: Diagnosis not present

## 2024-03-16 DIAGNOSIS — K219 Gastro-esophageal reflux disease without esophagitis: Secondary | ICD-10-CM | POA: Diagnosis not present

## 2024-03-16 DIAGNOSIS — J3089 Other allergic rhinitis: Secondary | ICD-10-CM | POA: Diagnosis not present

## 2024-03-16 DIAGNOSIS — E2749 Other adrenocortical insufficiency: Secondary | ICD-10-CM | POA: Diagnosis not present

## 2024-03-16 DIAGNOSIS — J3081 Allergic rhinitis due to animal (cat) (dog) hair and dander: Secondary | ICD-10-CM | POA: Diagnosis not present

## 2024-03-24 DIAGNOSIS — J3089 Other allergic rhinitis: Secondary | ICD-10-CM | POA: Diagnosis not present

## 2024-03-24 DIAGNOSIS — J3081 Allergic rhinitis due to animal (cat) (dog) hair and dander: Secondary | ICD-10-CM | POA: Diagnosis not present

## 2024-03-24 DIAGNOSIS — J301 Allergic rhinitis due to pollen: Secondary | ICD-10-CM | POA: Diagnosis not present

## 2024-03-31 DIAGNOSIS — J301 Allergic rhinitis due to pollen: Secondary | ICD-10-CM | POA: Diagnosis not present

## 2024-03-31 DIAGNOSIS — J3089 Other allergic rhinitis: Secondary | ICD-10-CM | POA: Diagnosis not present

## 2024-03-31 DIAGNOSIS — J3081 Allergic rhinitis due to animal (cat) (dog) hair and dander: Secondary | ICD-10-CM | POA: Diagnosis not present

## 2024-04-06 ENCOUNTER — Other Ambulatory Visit: Payer: Self-pay | Admitting: Neurosurgery

## 2024-04-06 DIAGNOSIS — J3089 Other allergic rhinitis: Secondary | ICD-10-CM | POA: Diagnosis not present

## 2024-04-06 DIAGNOSIS — J301 Allergic rhinitis due to pollen: Secondary | ICD-10-CM | POA: Diagnosis not present

## 2024-04-06 DIAGNOSIS — J3081 Allergic rhinitis due to animal (cat) (dog) hair and dander: Secondary | ICD-10-CM | POA: Diagnosis not present

## 2024-04-11 ENCOUNTER — Other Ambulatory Visit (HOSPITAL_COMMUNITY): Payer: Self-pay | Admitting: Family Medicine

## 2024-04-11 DIAGNOSIS — Z87891 Personal history of nicotine dependence: Secondary | ICD-10-CM

## 2024-04-11 DIAGNOSIS — R911 Solitary pulmonary nodule: Secondary | ICD-10-CM

## 2024-04-13 DIAGNOSIS — J301 Allergic rhinitis due to pollen: Secondary | ICD-10-CM | POA: Diagnosis not present

## 2024-04-13 DIAGNOSIS — J3089 Other allergic rhinitis: Secondary | ICD-10-CM | POA: Diagnosis not present

## 2024-04-13 DIAGNOSIS — J3081 Allergic rhinitis due to animal (cat) (dog) hair and dander: Secondary | ICD-10-CM | POA: Diagnosis not present

## 2024-04-19 DIAGNOSIS — G2581 Restless legs syndrome: Secondary | ICD-10-CM | POA: Diagnosis not present

## 2024-04-20 DIAGNOSIS — J3089 Other allergic rhinitis: Secondary | ICD-10-CM | POA: Diagnosis not present

## 2024-04-20 DIAGNOSIS — J3081 Allergic rhinitis due to animal (cat) (dog) hair and dander: Secondary | ICD-10-CM | POA: Diagnosis not present

## 2024-04-20 DIAGNOSIS — J301 Allergic rhinitis due to pollen: Secondary | ICD-10-CM | POA: Diagnosis not present

## 2024-04-21 ENCOUNTER — Other Ambulatory Visit: Payer: Self-pay | Admitting: Orthopedic Surgery

## 2024-04-26 ENCOUNTER — Ambulatory Visit (HOSPITAL_COMMUNITY)

## 2024-04-26 ENCOUNTER — Encounter (HOSPITAL_COMMUNITY): Payer: Self-pay

## 2024-05-01 ENCOUNTER — Ambulatory Visit (HOSPITAL_COMMUNITY)
Admission: RE | Admit: 2024-05-01 | Discharge: 2024-05-01 | Disposition: A | Discharge status: Home / Self Care | Source: Ambulatory Visit | Attending: Family Medicine | Admitting: Family Medicine

## 2024-05-01 ENCOUNTER — Other Ambulatory Visit: Payer: Self-pay | Admitting: Family Medicine

## 2024-05-01 DIAGNOSIS — Z6825 Body mass index (BMI) 25.0-25.9, adult: Secondary | ICD-10-CM | POA: Diagnosis not present

## 2024-05-01 DIAGNOSIS — E271 Primary adrenocortical insufficiency: Secondary | ICD-10-CM | POA: Diagnosis not present

## 2024-05-01 DIAGNOSIS — G459 Transient cerebral ischemic attack, unspecified: Secondary | ICD-10-CM | POA: Insufficient documentation

## 2024-05-01 DIAGNOSIS — I6522 Occlusion and stenosis of left carotid artery: Secondary | ICD-10-CM | POA: Diagnosis not present

## 2024-05-01 DIAGNOSIS — E7849 Other hyperlipidemia: Secondary | ICD-10-CM | POA: Diagnosis not present

## 2024-05-01 DIAGNOSIS — E782 Mixed hyperlipidemia: Secondary | ICD-10-CM | POA: Diagnosis not present

## 2024-05-02 ENCOUNTER — Other Ambulatory Visit: Payer: Self-pay

## 2024-05-04 ENCOUNTER — Encounter (INDEPENDENT_AMBULATORY_CARE_PROVIDER_SITE_OTHER): Payer: Self-pay | Admitting: Gastroenterology

## 2024-05-04 DIAGNOSIS — J3089 Other allergic rhinitis: Secondary | ICD-10-CM | POA: Diagnosis not present

## 2024-05-04 DIAGNOSIS — J3081 Allergic rhinitis due to animal (cat) (dog) hair and dander: Secondary | ICD-10-CM | POA: Diagnosis not present

## 2024-05-04 DIAGNOSIS — J301 Allergic rhinitis due to pollen: Secondary | ICD-10-CM | POA: Diagnosis not present

## 2024-05-09 ENCOUNTER — Ambulatory Visit: Admitting: Vascular Surgery

## 2024-05-09 ENCOUNTER — Ambulatory Visit (HOSPITAL_COMMUNITY)
Admission: RE | Admit: 2024-05-09 | Discharge: 2024-05-09 | Disposition: A | Discharge status: Home / Self Care | Source: Ambulatory Visit | Attending: Family Medicine | Admitting: Family Medicine

## 2024-05-09 ENCOUNTER — Encounter: Payer: Self-pay | Admitting: Vascular Surgery

## 2024-05-09 VITALS — BP 117/72 | HR 78 | Temp 97.8°F | Resp 18 | Ht 67.0 in | Wt 155.2 lb

## 2024-05-09 DIAGNOSIS — M545 Low back pain, unspecified: Secondary | ICD-10-CM

## 2024-05-09 DIAGNOSIS — G8929 Other chronic pain: Secondary | ICD-10-CM | POA: Diagnosis not present

## 2024-05-09 DIAGNOSIS — Z87891 Personal history of nicotine dependence: Secondary | ICD-10-CM | POA: Insufficient documentation

## 2024-05-09 DIAGNOSIS — R911 Solitary pulmonary nodule: Secondary | ICD-10-CM | POA: Insufficient documentation

## 2024-05-09 NOTE — Progress Notes (Signed)
 Patient name: Kathryn [name] MRN: [id number] DOB: Feb 03, 1956 Sex: female  REASON FOR CONSULT: Abdominal exposure for L5-S1 ALIF  HPI: Kathryn [name] is a 68 y.o. female, with history of chronic lower back pain who presents for evaluation of abdominal surgery for L5-S1 ALIF.  Patient is under the care of Dr. Debby with [facility] neurosurgery.  States she has had low back pain with leg pain since 2014 at the time of her last back surgery.  Prior abdominal surgery includes appendectomy, open cholecystectomy, and hiatal hernia repair.  She did have a prior L4-L5 decompressive laminectomy with posterior interbody fusion at L4-L5 with interbody cage by Dr. Amon in 2014.  Past Medical History:  Diagnosis Date   Adrenal insufficiency (HCC)    occured following steroid shoulder injections ~ spring 2017   Anxiety    pt. reports that she has RLS- takes Xanax  for calming her self so she can settle & sleep    Arthritis    shoulder & back, knees & leg     Asthma    aleery related   Atypical mole 11/12/2020   mod-severe- mid back   Chest pain, atypical    had cardiac workup - deemed related to anxiety   Chronic bronchitis (HCC)    less since I quit smoking (02/21/2016)   Chronic lower back pain    Dyspnea    related to spray paint that is put in  beer cans at her place of employment.  Pt. also reports that she has anxiety also that leads to SOB., Pt. reports her Mother just died a week ago     GERD (gastroesophageal reflux disease)    Guillain-Barre syndrome following vaccination (HCC) 1970's   post swine flu shot, hosp. for treatment for 1 month    H/O dizziness    H/O hiatal hernia    Hepatitis    hepatitis as a child; got it from a little girl at school make sick and vomiting   Herpes simplex    History of Guillain-Barre syndrome ~ 1978   from the swine flu shot   IBS (irritable bowel syndrome)    Pneumonia    PONV (postoperative nausea and vomiting)    Restless leg syndrome     Walking pneumonia ~ 2011    Past Surgical History:  Procedure Laterality Date   61 HOUR PH STUDY N/A 09/23/2016   Procedure: 24 HOUR PH STUDY;  Surgeon: Gustav Shila GAILS, MD;  Location: [facility];  Service: Endoscopy;  Laterality: N/A;   ANTERIOR CERVICAL DECOMP/DISCECTOMY FUSION  2000 & 2015   two times ago- C5 and C6, Dr. Lynwood JONELLE Amon, MD 2015   APPENDECTOMY  1980s   BACK SURGERY     BIOPSY  03/24/2022   Procedure: BIOPSY;  Surgeon: Eartha Angelia Sieving, MD;  [facility]: [facility];  Service: Gastroenterology;;   BREAST BIOPSY Right    benign   CHOLECYSTECTOMY OPEN  1980s   COLONOSCOPY N/A 09/20/2014   rehman: Prep was excellent except she had thick layer of stool coating cecal and ascending colon mucosa. Cecal landmarks were well identified after vigorous washing. Small lymphoid polyp ablated via cold biopsy from appendiceal stump.Mucosa of rest of the colon was normal. Normal mucosa of rectum and anorectal junction.   ESOPHAGEAL DILATION N/A 05/29/2019   Procedure: ESOPHAGEAL DILATION;  Surgeon: Golda Claudis PENNER, MD;  [facility];  Service: Endoscopy;  Laterality: N/A;   ESOPHAGEAL MANOMETRY N/A 09/23/2016   Procedure:  ESOPHAGEAL MANOMETRY (EM);  Surgeon: Gustav Shila GAILS, MD;  Location: [facility];  Service: Endoscopy;  Laterality: N/A;   ESOPHAGOGASTRODUODENOSCOPY N/A 09/20/2014   Procedure: ESOPHAGOGASTRODUODENOSCOPY (EGD);  Surgeon: Claudis RAYMOND Rivet, MD;  [facility];  Service: Endoscopy;  Laterality: N/A;   ESOPHAGOGASTRODUODENOSCOPY (EGD) WITH PROPOFOL  N/A 05/29/2019   - Z-line, 38 cm from the incisors.   ESOPHAGOGASTRODUODENOSCOPY (EGD) WITH PROPOFOL  N/A 03/24/2022   Procedure: ESOPHAGOGASTRODUODENOSCOPY (EGD) WITH PROPOFOL ;  Surgeon: Eartha Angelia Sieving, MD;  [facility];  Service: Gastroenterology;  Laterality: N/A;  730 ASA 1   HIATAL HERNIA REPAIR N/A 11/27/2016   Procedure: LAPAROSCOPIC REPAIR OF HIATAL HERNIA  WITH NISSEN FUNDOPLICATION;  Surgeon: Krystal Russell, MD;  Location: [facility];  Service: General;  Laterality: N/A;   INCISION AND DRAINAGE OF WOUND Left 02/21/2016   forearm, minor   IR ANGIO INTRA EXTRACRAN SEL INTERNAL CAROTID BILAT MOD SED  11/03/2022   IR ANGIO VERTEBRAL SEL VERTEBRAL UNI L MOD SED  11/03/2022   LUMBAR LAMINECTOMY/DECOMPRESSION MICRODISCECTOMY Right 06/22/2013   Procedure: RIGHT LUMBAR TWO-THREE DISCECTOMY;  Surgeon: Lynwood JONELLE Mill, MD;  Location: [facility];  Service: Neurosurgery;  Laterality: Right;  Right    MALONEY DILATION N/A 09/20/2014   Procedure: AGAPITO DILATION;  Surgeon: Claudis RAYMOND Rivet, MD;  [facility];  Service: Endoscopy;  Laterality: N/A;   MINOR IRRIGATION AND DEBRIDEMENT OF WOUND Left 02/21/2016   Procedure: MINOR IRRIGATION AND DEBRIDEMENT/REPAIR OF LEFT ARM WOUND;  Surgeon: Marcey Her, MD;  Location: [facility];  Service: Orthopedics;  Laterality: Left;   REVERSE SHOULDER ARTHROPLASTY Right 02/21/2016   Procedure: RIGHT REVERSE TOTAL SHOULDER ARTHROPLASTY;  Surgeon: Marcey Her, MD;  Location: [facility] OR;  Service: Orthopedics;  Laterality: Right;   REVERSE TOTAL SHOULDER ARTHROPLASTY Right 02/21/2016   SAVORY DILATION  03/24/2022   Procedure: SAVORY DILATION;  Surgeon: Eartha Angelia, Sieving, MD;  [facility];  Service: Gastroenterology;;   SHOULDER ARTHROSCOPY W/ ROTATOR CUFF REPAIR Right 1990s   TOTAL SHOULDER REPLACEMENT     Right   VAGINAL HYSTERECTOMY      Family History  Problem Relation Age of Onset   Congestive Heart Failure Mother    Atrial fibrillation Mother    Hiatal hernia Mother    Cancer Father        lung   Heart attack Brother    Stroke Paternal Grandfather    Diabetes Paternal Grandfather    Alzheimer's disease Paternal Grandmother     SOCIAL HISTORY: Social History   Socioeconomic History   Marital status: Married    Spouse name: Not on file   Number of children: Not on file   Years of  education: Not on file   Highest education level: Not on file  Occupational History   Occupation: Production manager  Tobacco Use   Smoking status: Former    Current packs/day: 0.00    Average packs/day: 3.0 packs/day for 10.0 years (30.0 ttl pk-yrs)    Types: Cigarettes    Start date: 05/08/1989    Quit date: 05/09/1999    Years since quitting: 25.0    Passive exposure: Current   Smokeless tobacco: Never   Tobacco comments:    quit smoking in ~ 2003  Vaping Use   Vaping status: Never Used  Substance and Sexual Activity   Alcohol  use: No    Comment: 02/21/2016 I used to drink a little bit; probably quit in my 20s   Drug use: No  Sexual activity: Yes    Birth control/protection: Surgical    Comment: hyst  Other Topics Concern   Not on file  Social History Narrative   Walks but no other activity    Anxiety   Right handed   Caffeine-64oz daily   Social Drivers of Corporate investment banker Strain: Not on file  Food Insecurity: Not on file  Transportation Needs: Not on file  Physical Activity: Not on file  Stress: Not on file  Social Connections: Not on file  Intimate Partner Violence: Not on file    Allergies  Allergen Reactions   Niacin And Related Shortness Of Breath, Swelling and Other (See Comments)    Neck; skin hot   Codeine Swelling and Other (See Comments)    tongue   Zinc Swelling    Current Outpatient Medications  Medication Sig Dispense Refill   acetaminophen  (TYLENOL ) 500 MG tablet Take 500-1,000 mg by mouth every 6 (six) hours as needed (For pain.).     albuterol  (PROVENTIL  HFA;VENTOLIN  HFA) 108 (90 Base) MCG/ACT inhaler Inhale 2 puffs into the lungs every 4 (four) hours as needed for wheezing or shortness of breath.     ALPRAZolam  (XANAX ) 0.5 MG tablet Take 0.5 mg by mouth daily as needed for anxiety.     Biotin  10000 MCG TABS Take 10,000 mcg by mouth daily.      Cholecalciferol  (VITAMIN D -3) 125 MCG (5000 UT) TABS Take 5,000 Units by mouth daily.       cyanocobalamin  (VITAMIN B12) 1000 MCG tablet Take 1 tablet (1,000 mcg total) by mouth daily. 30 tablet 2   EPINEPHrine  0.3 mg/0.3 mL IJ SOAJ injection Inject 0.3 mg into the skin as needed for anaphylaxis.     escitalopram  (LEXAPRO ) 20 MG tablet Take 20 mg by mouth daily.     esomeprazole  (NEXIUM ) 40 MG capsule Take 1 capsule (40 mg total) by mouth daily. TAKE 1 CAPSULE(40 MG) BY MOUTH TWICE DAILY BEFORE A MEAL 90 capsule 3   fluticasone  (FLONASE ) 50 MCG/ACT nasal spray Place 2 sprays into both nostrils daily as needed for allergies or rhinitis.     GABAPENTIN PO Take by mouth. 100mg  at night     ibuprofen  (ADVIL ) 800 MG tablet Take 1 tablet (800 mg total) by mouth every 8 (eight) hours as needed. 90 tablet 0   levothyroxine  (SYNTHROID ) 25 MCG tablet Take 25 mcg by mouth daily before breakfast.     loratadine  (CLARITIN ) 10 MG tablet Take 10 mg by mouth daily.     montelukast  (SINGULAIR ) 10 MG tablet Take 10 mg by mouth every morning.      ondansetron  (ZOFRAN ) 4 MG tablet Take 1 tablet (4 mg total) by mouth every 8 (eight) hours as needed for nausea or vomiting. 30 tablet 1   rOPINIRole  (REQUIP ) 4 MG tablet Take 6 mg by mouth daily. 4 - 6 mg as needed     rosuvastatin  (CRESTOR ) 5 MG tablet Take 1 tablet (5 mg total) by mouth at bedtime. 90 tablet 3   sucralfate  (CARAFATE ) 1 g tablet TAKE 1 TABLET(1 GRAM) BY MOUTH FOUR TIMES DAILY AT BEDTIME WITH MEALS 120 tablet 1   valACYclovir  (VALTREX ) 1000 MG tablet Take 1,000 mg by mouth daily.     No current facility-administered medications for this visit.    REVIEW OF SYSTEMS:  [X]  denotes positive finding, [ ]  denotes negative finding Cardiac  Comments:  Chest pain or chest pressure:    Shortness of breath upon exertion:  Short of breath when lying flat:    Irregular heart rhythm:        Vascular    Pain in calf, thigh, or hip brought on by ambulation:    Pain in feet at night that wakes you up from your sleep:     Blood clot in your veins:     Leg swelling:         Pulmonary    Oxygen at home:    Productive cough:     Wheezing:         Neurologic    Sudden weakness in arms or legs:     Sudden numbness in arms or legs:     Sudden onset of difficulty speaking or slurred speech:    Temporary loss of vision in one eye:     Problems with dizziness:         Gastrointestinal    Blood in stool:     Vomited blood:         Genitourinary    Burning when urinating:     Blood in urine:        Psychiatric    Major depression:         Hematologic    Bleeding problems:    Problems with blood clotting too easily:        Skin    Rashes or ulcers:        Constitutional    Fever or chills:      PHYSICAL EXAM: There were no vitals filed for this visit.  GENERAL: The patient is a well-nourished female, in no acute distress. The vital signs are documented above. CARDIAC: There is a regular rate and rhythm.  VASCULAR:  Palpable femoral pulses bilaterally Palpable DP pulses bilaterally PULMONARY: No respiratory distress. ABDOMEN: Soft and non-tender. MUSCULOSKELETAL: There are no major deformities or cyanosis. NEUROLOGIC: No focal weakness or paresthesias are detected. SKIN: There are no ulcers or rashes noted. PSYCHIATRIC: The patient has a normal affect.  DATA:   MRI reviewed 06/01/23    Assessment/Plan:  68 y.o. female, with history of chronic lower back pain who presents for evaluation of abdominal expsoure for L5-S1 ALIF.  Patient is under the care of Dr. Debby with [facility] neurosurgery.   I have reviewed her MRI and discussed she will be a good candidate for anterior approach.  I discussed transverse incision over the left rectus muscle with mobilizing into the retroperitoneum and mobilizing peritoneum and left ureter across midline to get to the disc space.  Discussed exposing the disc space from the front including moving iliac artery and vein.  Discussed risk of injury to the above structures.  All  questions answered.  Discussed this L5-S1 disc will be below her prior instrumented level.  Currently scheduled for 9/15 with Dr. Debby.   Lonni DOROTHA Gaskins, MD [facility] Office: [phone]

## 2024-05-11 ENCOUNTER — Telehealth (INDEPENDENT_AMBULATORY_CARE_PROVIDER_SITE_OTHER): Payer: Self-pay | Admitting: Gastroenterology

## 2024-05-11 NOTE — Telephone Encounter (Signed)
 Patient needs refill on her omeprazole  40mg  and uses [facility] on [address]. She is on the Sept recall list, but said she is having back surgery on 9/15 and wanted to wait to follow up. I added her to the Oct recall list and she is aware. [phone]

## 2024-05-12 ENCOUNTER — Other Ambulatory Visit (INDEPENDENT_AMBULATORY_CARE_PROVIDER_SITE_OTHER): Payer: Self-pay

## 2024-05-12 DIAGNOSIS — K219 Gastro-esophageal reflux disease without esophagitis: Secondary | ICD-10-CM

## 2024-05-12 DIAGNOSIS — R1319 Other dysphagia: Secondary | ICD-10-CM

## 2024-05-12 DIAGNOSIS — J3089 Other allergic rhinitis: Secondary | ICD-10-CM | POA: Diagnosis not present

## 2024-05-12 DIAGNOSIS — J3081 Allergic rhinitis due to animal (cat) (dog) hair and dander: Secondary | ICD-10-CM | POA: Diagnosis not present

## 2024-05-12 DIAGNOSIS — J301 Allergic rhinitis due to pollen: Secondary | ICD-10-CM | POA: Diagnosis not present

## 2024-05-12 MED ORDER — ESOMEPRAZOLE MAGNESIUM 40 MG PO CPDR: 40.0000 mg | DELAYED_RELEASE_CAPSULE | Freq: Every day | ORAL | 3 refills | Status: AC

## 2024-05-12 NOTE — Telephone Encounter (Signed)
 I spoke with the patient and she says the medication is esomeprazole  and she needs a refill sent to her pharmacy . I have sent in her refills to the requested pharmacy.

## 2024-05-17 DIAGNOSIS — F419 Anxiety disorder, unspecified: Secondary | ICD-10-CM | POA: Diagnosis not present

## 2024-05-17 DIAGNOSIS — E039 Hypothyroidism, unspecified: Secondary | ICD-10-CM | POA: Diagnosis not present

## 2024-05-17 DIAGNOSIS — J45909 Unspecified asthma, uncomplicated: Secondary | ICD-10-CM | POA: Diagnosis not present

## 2024-05-17 DIAGNOSIS — Z87891 Personal history of nicotine dependence: Secondary | ICD-10-CM | POA: Diagnosis not present

## 2024-05-17 DIAGNOSIS — M4126 Other idiopathic scoliosis, lumbar region: Secondary | ICD-10-CM | POA: Diagnosis not present

## 2024-05-17 DIAGNOSIS — E2740 Unspecified adrenocortical insufficiency: Secondary | ICD-10-CM | POA: Diagnosis not present

## 2024-05-17 DIAGNOSIS — Z8673 Personal history of transient ischemic attack (TIA), and cerebral infarction without residual deficits: Secondary | ICD-10-CM | POA: Diagnosis not present

## 2024-05-17 DIAGNOSIS — K219 Gastro-esophageal reflux disease without esophagitis: Secondary | ICD-10-CM | POA: Diagnosis not present

## 2024-05-17 DIAGNOSIS — Z01818 Encounter for other preprocedural examination: Secondary | ICD-10-CM | POA: Diagnosis not present

## 2024-05-17 DIAGNOSIS — G2581 Restless legs syndrome: Secondary | ICD-10-CM | POA: Diagnosis not present

## 2024-05-17 NOTE — Progress Notes (Signed)
 Surgical Instructions   Your procedure is scheduled on Monday May 22, 2024. Report to [facility] Main Entrance A at 5:30 A.M., then check in with the Admitting office. Any questions or running late day of surgery: call [phone]  Questions prior to your surgery date: call [phone], Monday-Friday, 8am-4pm. If you experience any cold or flu symptoms such as cough, fever, chills, shortness of breath, etc. between now and your scheduled surgery, please notify us  at the above number.    Remember:  Do not eat or drink  after midnight the night before your surgery  Take these medicines the morning of surgery with A SIP OF WATER   escitalopram  (LEXAPRO )  esomeprazole  (NEXIUM )  levothyroxine  (SYNTHROID )  loratadine  (CLARITIN )  montelukast  (SINGULAIR )  valACYclovir  (VALTREX )   May take these medicines IF NEEDED: acetaminophen  (TYLENOL )  albuterol  (PROVENTIL  HFA;VENTOLIN  HFA) 108 (90 Base) MCG/ACT inhaler Please bring with you to the hospital ALPRAZolam  (XANAX )  fluticasone  (FLONASE )  ondansetron  (ZOFRAN )   One week prior to surgery, STOP taking any Aspirin  (unless otherwise instructed by your surgeon) Aleve, Naproxen, Ibuprofen , Motrin , Advil , Goody's, BC's, all herbal medications, fish oil, and non-prescription vitamins.                     Do NOT Smoke (Tobacco/Vaping) for 24 hours prior to your procedure.  If you use a CPAP at night, you may bring your mask/headgear for your overnight stay.   You will be asked to remove any contacts, glasses, piercing's, hearing aid's, dentures/partials prior to surgery. Please bring cases for these items if needed.    Patients discharged the day of surgery will not be allowed to drive home, and someone needs to stay with them for 24 hours.  SURGICAL WAITING ROOM VISITATION Patients may have no more than 2 support people in the waiting area - these visitors may rotate.   Pre-op nurse will coordinate an appropriate time for 1 ADULT  support person, who may not rotate, to accompany patient in pre-op.  Children under the age of 30 must have an adult with them who is not the patient and must remain in the main waiting area with an adult.  If the patient needs to stay at the hospital during part of their recovery, the visitor guidelines for inpatient rooms apply.  Please refer to the [facility] website for the visitor guidelines for any additional information.   If you received a COVID test during your pre-op visit  it is requested that you wear a mask when out in public, stay away from anyone that may not be feeling well and notify your surgeon if you develop symptoms. If you have been in contact with anyone that has tested positive in the last 10 days please notify you surgeon.      Pre-operative 5 CHG Bathing Instructions   You can play a key role in reducing the risk of infection after surgery. Your skin needs to be as free of germs as possible. You can reduce the number of germs on your skin by washing with CHG (chlorhexidine  gluconate) soap before surgery. CHG is an antiseptic soap that kills germs and continues to kill germs even after washing.   DO NOT use if you have an allergy to chlorhexidine /CHG or antibacterial soaps. If your skin becomes reddened or irritated, stop using the CHG and notify one of our RNs at [phone].   Please shower with the CHG soap starting 4 days before surgery using the following schedule:  Please keep in mind the following:  DO NOT shave, including legs and underarms, starting the day of your first shower.   Place clean sheets on your bed the day you start using CHG soap. Use a clean washcloth (not used since being washed) for each shower. DO NOT sleep with pets once you start using the CHG.   CHG Shower Instructions:  Wash your face and private area with normal soap. If you choose to wash your hair, wash first with your normal shampoo.  After you use shampoo/soap, rinse  your hair and body thoroughly to remove shampoo/soap residue.  Turn the water  OFF and apply about 3 tablespoons (45 ml) of CHG soap to a CLEAN washcloth.  Apply CHG soap ONLY FROM YOUR NECK DOWN TO YOUR TOES (washing for 3-5 minutes)  DO NOT use CHG soap on face, private areas, open wounds, or sores.  Pay special attention to the area where your surgery is being performed.  If you are having back surgery, having someone wash your back for you may be helpful. Wait 2 minutes after CHG soap is applied, then you may rinse off the CHG soap.  Pat dry with a clean towel  Put on clean clothes/pajamas   If you choose to wear lotion, please use ONLY the CHG-compatible lotions that are listed below.  Additional instructions for the day of surgery: DO NOT APPLY any lotions, deodorants or perfumes.   Do not bring valuables to the hospital. [facility] is not responsible for any belongings/valuables. Do not wear nail polish, gel polish, artificial nails, or any other type of covering on natural nails (fingers and toes) Do not wear jewelry or makeup Put on clean/comfortable clothes.  Please brush your teeth.  Ask your nurse before applying any prescription medications to the skin.     CHG Compatible Lotions   Aveeno Moisturizing lotion  Cetaphil Moisturizing Cream  Cetaphil Moisturizing Lotion  Clairol Herbal Essence Moisturizing Lotion, Dry Skin  Clairol Herbal Essence Moisturizing Lotion, Extra Dry Skin  Clairol Herbal Essence Moisturizing Lotion, Normal Skin  Curel Age Defying Therapeutic Moisturizing Lotion with Alpha Hydroxy  Curel Extreme Care Body Lotion  Curel Soothing Hands Moisturizing Hand Lotion  Curel Therapeutic Moisturizing Cream, Fragrance-Free  Curel Therapeutic Moisturizing Lotion, Fragrance-Free  Curel Therapeutic Moisturizing Lotion, Original Formula  Eucerin Daily Replenishing Lotion  Eucerin Dry Skin Therapy Plus Alpha Hydroxy Crme  Eucerin Dry Skin Therapy Plus Alpha  Hydroxy Lotion  Eucerin Original Crme  Eucerin Original Lotion  Eucerin Plus Crme Eucerin Plus Lotion  Eucerin TriLipid Replenishing Lotion  Keri Anti-Bacterial Hand Lotion  Keri Deep Conditioning Original Lotion Dry Skin Formula Softly Scented  Keri Deep Conditioning Original Lotion, Fragrance Free Sensitive Skin Formula  Keri Lotion Fast Absorbing Fragrance Free Sensitive Skin Formula  Keri Lotion Fast Absorbing Softly Scented Dry Skin Formula  Keri Original Lotion  Keri Skin Renewal Lotion Keri Silky Smooth Lotion  Keri Silky Smooth Sensitive Skin Lotion  Nivea Body Creamy Conditioning Oil  Nivea Body Extra Enriched Lotion  Nivea Body Original Lotion  Nivea Body Sheer Moisturizing Lotion Nivea Crme  Nivea Skin Firming Lotion  NutraDerm 30 Skin Lotion  NutraDerm Skin Lotion  NutraDerm Therapeutic Skin Cream  NutraDerm Therapeutic Skin Lotion  ProShield Protective Hand Cream  Provon moisturizing lotion  Please read over the following fact sheets that you were given.

## 2024-05-18 ENCOUNTER — Encounter (HOSPITAL_COMMUNITY)
Admission: RE | Admit: 2024-05-18 | Discharge: 2024-05-18 | Disposition: A | Discharge status: Home / Self Care | Source: Ambulatory Visit | Attending: Neurosurgery | Admitting: Neurosurgery

## 2024-05-18 ENCOUNTER — Encounter (HOSPITAL_COMMUNITY): Payer: Self-pay

## 2024-05-18 ENCOUNTER — Other Ambulatory Visit: Payer: Self-pay

## 2024-05-18 VITALS — BP 136/73 | HR 65 | Temp 98.4°F | Resp 18 | Ht 67.0 in | Wt 154.7 lb

## 2024-05-18 DIAGNOSIS — Z01818 Encounter for other preprocedural examination: Secondary | ICD-10-CM | POA: Diagnosis not present

## 2024-05-18 DIAGNOSIS — E039 Hypothyroidism, unspecified: Secondary | ICD-10-CM | POA: Insufficient documentation

## 2024-05-18 DIAGNOSIS — G2581 Restless legs syndrome: Secondary | ICD-10-CM | POA: Insufficient documentation

## 2024-05-18 DIAGNOSIS — K219 Gastro-esophageal reflux disease without esophagitis: Secondary | ICD-10-CM | POA: Diagnosis not present

## 2024-05-18 DIAGNOSIS — J45909 Unspecified asthma, uncomplicated: Secondary | ICD-10-CM | POA: Diagnosis not present

## 2024-05-18 DIAGNOSIS — M4126 Other idiopathic scoliosis, lumbar region: Secondary | ICD-10-CM | POA: Insufficient documentation

## 2024-05-18 DIAGNOSIS — Z8673 Personal history of transient ischemic attack (TIA), and cerebral infarction without residual deficits: Secondary | ICD-10-CM | POA: Insufficient documentation

## 2024-05-18 DIAGNOSIS — F419 Anxiety disorder, unspecified: Secondary | ICD-10-CM | POA: Diagnosis not present

## 2024-05-18 DIAGNOSIS — E2740 Unspecified adrenocortical insufficiency: Secondary | ICD-10-CM | POA: Diagnosis not present

## 2024-05-18 DIAGNOSIS — Z87891 Personal history of nicotine dependence: Secondary | ICD-10-CM | POA: Diagnosis not present

## 2024-05-18 HISTORY — DX: Transient cerebral ischemic attack, unspecified: G45.9

## 2024-05-18 HISTORY — DX: Hypothyroidism, unspecified: E03.9

## 2024-05-18 LAB — CBC
HCT: 37.7 % (ref 36.0–46.0)
Hemoglobin: 12.5 g/dL (ref 12.0–15.0)
MCH: 32.4 pg (ref 26.0–34.0)
MCHC: 33.2 g/dL (ref 30.0–36.0)
MCV: 97.7 fL (ref 80.0–100.0)
Platelets: 302 K/uL (ref 150–400)
RBC: 3.86 MIL/uL — ABNORMAL LOW (ref 3.87–5.11)
RDW: 12.1 % (ref 11.5–15.5)
WBC: 8 K/uL (ref 4.0–10.5)
nRBC: 0 % (ref 0.0–0.2)

## 2024-05-18 LAB — COMPREHENSIVE METABOLIC PANEL WITH GFR
ALT: 24 U/L (ref 0–44)
AST: 28 U/L (ref 15–41)
Albumin: 3.6 g/dL (ref 3.5–5.0)
Alkaline Phosphatase: 69 U/L (ref 38–126)
Anion gap: 7 (ref 5–15)
BUN: 9 mg/dL (ref 8–23)
CO2: 26 mmol/L (ref 22–32)
Calcium: 9.1 mg/dL (ref 8.9–10.3)
Chloride: 100 mmol/L (ref 98–111)
Creatinine, Ser: 0.92 mg/dL (ref 0.44–1.00)
GFR, Estimated: >60 mL/min (ref >60)
Glucose, Bld: 95 mg/dL (ref 70–99)
Potassium: 4.7 mmol/L (ref 3.5–5.1)
Sodium: 133 mmol/L — ABNORMAL LOW (ref 135–145)
Total Bilirubin: 0.5 mg/dL (ref 0.0–1.2)
Total Protein: 7.2 g/dL (ref 6.5–8.1)

## 2024-05-18 LAB — SURGICAL PCR SCREEN
MRSA, PCR: NEGATIVE
Staphylococcus aureus: NEGATIVE

## 2024-05-18 NOTE — Progress Notes (Incomplete)
 Anesthesia APP PAT Evaluation:  Case: [id number] Date/Time: 05/22/24 0715   Procedures:      ANTERIOR LUMBAR FUSION 1 LEVEL - L5-S1 ALIF     ANTERIOR LATERAL LUMBAR FUSION 3 LEVELS (Right) - RT, L12, L23, L34 DLIF     ABDOMINAL EXPOSURE   Anesthesia type: General   Diagnosis: Other idiopathic scoliosis, lumbar region [M41.26]   Pre-op diagnosis: IDIOPATHIC SCOLIOSIS, LUMBAR REGION   [facility] / [facility]   Surgeons: Debby Dorn MATSU, MD; Gretta Lonni PARAS, MD       DISCUSSION: Patient is a 68 year old female scheduled for the above procedure.  History includes former smoker (quit 05/09/1999), post-operative N/V, TIA (06/16/2022), GERD, hiatal hernia (s/p laparoscopic repair, Nissen fundoplication 11/27/2016), anxiety, allergic rhinitis, adrenal insufficiency (post steroid injection 2017), Guillain-Barre syndrome (1970's post swine flu vaccine), chronic bronchitis, asthma, RLS, IBS, childhood hepatitis A, spinal surgery (C5-7 ACDF 2004; L4-5 PLIF 06/22/2013), osteoarthritis (right reverse TSA 02/21/2016)  Evaluated during her PAT visit due to what sounds like bronchitis following a beach trip. Symptoms started around 04/29/2024. She denied fever and sore throat. She did take and complete a home prescription of doxycycline 100 mg BID. She is back to baseline now and if no changes will be at baseline for 2 weeks by her surgery date. Given her chronic allergic rhinitis, she does have a chronic n intermittent cough even at baseline. She reported allergens including mold, dust, mildew, trees, grass. Symptoms typically worse in the summer months. She follows with an immunologist and gets allergy shots weekly.   She denied chest pain or SOB. She may experience some DOE with weather is hot and humid or acute allergy exacerbation, otherwise no limitations from a breathing standpoint. She uses rescue inhaler as needed, typically a few times a month.  Heart RRR, no murmur. Lung clear. No carotid  bruits. No ankle edema. Good mouth opening. Denied neck mobility restrictions. She does not do much work outdoors due to her allergies, but is able to do her own house cleaning.    She sees endocrinologist Dr. Tommas yearly, last visit 03/16/2024. Adrenal insufficiency episode was in 2017 and in the setting of frequent steroid injections for back or shoulder pain. She was put on Cortef  then, but she says sh has not been on steroids for years or required stress steroids recently.   Normal ETT on 09/26/2018  She recently had a lung cancer screening chest CT on 05/09/2024, but the results are still in process.    VS: BP 136/73   Pulse 65   Temp 36.9 C   Resp 18   Ht 5' 7 (1.702 m)   Wt 70.2 kg   SpO2 98%   BMI 24.23 kg/m  Provider wore face mask. Heart RRR, no murmur. Lung clear. No carotid bruits. No ankle edema. Good mouth opening.   PROVIDERS: Marvine Rush, MD is PCP  Tommas Pears, MD is endocrinology Eartha Flavors, Toribio, MD is GI  Gregg Lek, MD is neurologist Fleeta Smock, [place], MD is allergist - She is not routinely followed by cardiology, but she had an evaluation by Charls Bender, MD in 2020 for palpitations and atypical chest pain. She had a normal ETT, and as needed cardiology follow-up advised at 01/03/2019 visit.     LABS: Labs reviewed: Acceptable for surgery. (all labs ordered are listed, but only abnormal results are displayed)  Labs Reviewed  COMPREHENSIVE METABOLIC PANEL WITH GFR - Abnormal; Notable for the following components:  Result Value   Sodium 133 (*)    All other components within normal limits  CBC - Abnormal; Notable for the following components:   RBC 3.86 (*)    All other components within normal limits  SURGICAL PCR SCREEN  TYPE AND SCREEN   On 03/01/2024 Cortisol 10.4, ACTH  14.7, TSH 1.380,   EEG 06/17/2022: IMPRESSION: - This study is within normal limits. No seizures or epileptiform discharges were seen throughout the  recording. - A normal interictal EEG does not exclude the diagnosis of epilepsy.   IMAGES: CT Chest LCS 05/09/2024: In process.  NM Gastric Emptying Study 02/03/2024: IMPRESSION: No scintigraphic evidence of delayed gastric emptying.  MRI C-spine 07/14/2023: IMPRESSION: 1. Prior fusion at C4 through C7 without residual spinal stenosis. Uncovertebral spurring with residual mild to moderate right C7 foraminal narrowing. 2. Adjacent segment disease at C3-4 with resultant severe right and mild to moderate left C4 foraminal stenosis. 3. Right paracentral to foraminal disc protrusion at T1-2 with resultant moderate to severe right foraminal stenosis.   MRI L-spine 06/01/2023: IMPRESSION: 1. Status post L4-5 discectomy and fusion without stenosis. 2. Spondylosis at L3-4 appears worse than on the prior exam. Moderate central canal stenosis is present. Mild to moderate foraminal narrowing is greater on the right. 3. No change in mild to moderate central canal stenosis at L1-2. 4. No change in moderate narrowing in the right subarticular recess and foramen at L2-3.    EKG: 05/18/2024:  Sinus bradycardia at 58 bpm  Low voltage QRS Borderline ECG When compared with ECG of 16-Jun-2022 22:03, No significant change since last tracing Confirmed by Waddell Lusher [phone]) on 05/18/2024 7:00:05 PM   CV: US  Carotid 05/01/2024: IMPRESSION: 1. Mild (1-49%) stenosis proximal left internal carotid artery secondary to small focal smooth heterogeneous atherosclerotic plaque. 2. No significant atherosclerotic plaque or evidence of stenosis in the right internal carotid artery. 3. Vertebral arteries are patent with normal antegrade flow.    Cardiac Event Monitor 10/272023 - 08/01/2022: .  Patient had a minimum heart rate of 49 bpm, maximum heart rate of 117 bpm, and average heart rate of 71 bpm. .  Predominant underlying rhythm was sinus rhythm. .  One four beat run of NSVT. SABRA  No atrial  fibrillation or flutter. .  Isolated PACs were rare (<1.0%). .  Isolated PVCs were rare (<1.0%). .  Triggered and diary events associated with sinus bradycardia, sinus rhythm, and PACs.   No malignant arrhythmias.   Echo 06/17/2022: IMPRESSIONS   1. Left ventricular ejection fraction, by estimation, is 55 to 60%. The  left ventricle has normal function. The left ventricle has no regional  wall motion abnormalities. Left ventricular diastolic parameters are  consistent with Grade I diastolic  dysfunction (impaired relaxation).   2. Right ventricular systolic function is low normal. The right  ventricular size is normal. There is normal pulmonary artery systolic  pressure.   3. The mitral valve is grossly normal. Trivial mitral valve  regurgitation.   4. The aortic valve is tricuspid. Aortic valve regurgitation is not  visualized.   5. The inferior vena cava is normal in size with greater than 50%  respiratory variability, suggesting right atrial pressure of 3 mmHg.  - Comparison(s): No prior Echocardiogram.    ETT 09/26/2018: Blood pressure demonstrated a hypertensive response to exercise. There was no ST segment deviation noted during stress.   Normal ETT No significant arrhythmia Normal hemodynamic response to exercise   Past Medical History:  Diagnosis Date  .  Adrenal insufficiency (HCC)    occured following steroid shoulder injections ~ spring 2017  . Anxiety    pt. reports that she has RLS- takes Xanax  for calming her self so she can settle & sleep   . Arthritis    shoulder & back, knees & leg    . Asthma    aleery related  . Atypical mole 11/12/2020   mod-severe- mid back  . Chest pain, atypical    had cardiac workup - deemed related to anxiety  . Chronic bronchitis (HCC)    less since I quit smoking (02/21/2016)  . Chronic lower back pain   . Dyspnea    related to spray paint that is put in  beer cans at her place of employment.  Pt. also reports that she  has anxiety also that leads to SOB., Pt. reports her Mother just died a week ago    . GERD (gastroesophageal reflux disease)   . Guillain-Barre syndrome following vaccination (HCC) 1970's   post swine flu shot, hosp. for treatment for 1 month   . H/O dizziness   . H/O hiatal hernia   . Hepatitis    hepatitis as a child; got it from a little girl at school make sick and vomiting  . Herpes simplex   . History of Guillain-Barre syndrome ~ 1978   from the swine flu shot  . Hypothyroidism   . IBS (irritable bowel syndrome)   . Pneumonia   . PONV (postoperative nausea and vomiting)   . Restless leg syndrome   . TIA (transient ischemic attack)   . Walking pneumonia ~ 2011    Past Surgical History:  Procedure Laterality Date  . 24 HOUR PH STUDY N/A 09/23/2016   Procedure: 24 HOUR PH STUDY;  Surgeon: Gustav Shila GAILS, MD;  Location: [facility];  Service: Endoscopy;  Laterality: N/A;  . ANTERIOR CERVICAL DECOMP/DISCECTOMY FUSION  2000 & 2015   two times ago- C5 and C6, Dr. Lynwood JONELLE Mill, MD 2015  . APPENDECTOMY  1980s  . BACK SURGERY    . BIOPSY  03/24/2022   Procedure: BIOPSY;  Surgeon: Eartha Angelia Sieving, MD;  [facility]: [facility];  Service: Gastroenterology;;  . BREAST BIOPSY Right    benign  . CHOLECYSTECTOMY OPEN  1980s  . COLONOSCOPY N/A 09/20/2014   rehman: Prep was excellent except she had thick layer of stool coating cecal and ascending colon mucosa. Cecal landmarks were well identified after vigorous washing. Small lymphoid polyp ablated via cold biopsy from appendiceal stump.Mucosa of rest of the colon was normal. Normal mucosa of rectum and anorectal junction.  . ESOPHAGEAL DILATION N/A 05/29/2019   Procedure: ESOPHAGEAL DILATION;  Surgeon: Golda Claudis PENNER, MD;  [facility];  Service: Endoscopy;  Laterality: N/A;  . ESOPHAGEAL MANOMETRY N/A 09/23/2016   Procedure: ESOPHAGEAL MANOMETRY (EM);  Surgeon: Gustav Shila GAILS, MD;  Location: [facility];  Service: Endoscopy;  Laterality: N/A;  . ESOPHAGOGASTRODUODENOSCOPY N/A 09/20/2014   Procedure: ESOPHAGOGASTRODUODENOSCOPY (EGD);  Surgeon: Claudis PENNER Golda, MD;  [facility];  Service: Endoscopy;  Laterality: N/A;  . ESOPHAGOGASTRODUODENOSCOPY (EGD) WITH PROPOFOL  N/A 05/29/2019   - Z-line, 38 cm from the incisors.  . ESOPHAGOGASTRODUODENOSCOPY (EGD) WITH PROPOFOL  N/A 03/24/2022   Procedure: ESOPHAGOGASTRODUODENOSCOPY (EGD) WITH PROPOFOL ;  Surgeon: Eartha Angelia Sieving, MD;  [facility];  Service: Gastroenterology;  Laterality: N/A;  730 ASA 1  . HIATAL HERNIA REPAIR N/A 11/27/2016   Procedure: LAPAROSCOPIC REPAIR OF HIATAL HERNIA  WITH NISSEN FUNDOPLICATION;  Surgeon: Krystal Russell, MD;  Location: [facility];  Service: General;  Laterality: N/A;  . INCISION AND DRAINAGE OF WOUND Left 02/21/2016   forearm, minor  . IR ANGIO INTRA EXTRACRAN SEL INTERNAL CAROTID BILAT MOD SED  11/03/2022  . IR ANGIO VERTEBRAL SEL VERTEBRAL UNI L MOD SED  11/03/2022  . LUMBAR LAMINECTOMY/DECOMPRESSION MICRODISCECTOMY Right 06/22/2013   Procedure: RIGHT LUMBAR TWO-THREE DISCECTOMY;  Surgeon: Lynwood JONELLE Mill, MD;  [facility]: [facility];  Service: Neurosurgery;  Laterality: Right;  Right   . MALONEY DILATION N/A 09/20/2014   Procedure: AGAPITO DILATION;  Surgeon: Claudis RAYMOND Rivet, MD;  [facility];  Service: Endoscopy;  Laterality: N/A;  . MINOR IRRIGATION AND DEBRIDEMENT OF WOUND Left 02/21/2016   Procedure: MINOR IRRIGATION AND DEBRIDEMENT/REPAIR OF LEFT ARM WOUND;  Surgeon: Marcey Her, MD;  Location: [facility];  Service: Orthopedics;  Laterality: Left;  . REVERSE SHOULDER ARTHROPLASTY Right 02/21/2016   Procedure: RIGHT REVERSE TOTAL SHOULDER ARTHROPLASTY;  Surgeon: Marcey Her, MD;  [facility];  Service: Orthopedics;  Laterality: Right;  . REVERSE TOTAL SHOULDER ARTHROPLASTY Right 02/21/2016  . SAVORY DILATION  03/24/2022   Procedure: SAVORY DILATION;  Surgeon:  Eartha Flavors, Toribio, MD;  [facility];  Service: Gastroenterology;;  . SHOULDER ARTHROSCOPY W/ ROTATOR CUFF REPAIR Right 1990s  . TOTAL SHOULDER REPLACEMENT     Right  . VAGINAL HYSTERECTOMY      MEDICATIONS: . aspirin  EC 81 MG tablet  . acetaminophen  (TYLENOL ) 500 MG tablet  . AIRSUPRA 90-80 MCG/ACT AERO  . albuterol  (PROVENTIL  HFA;VENTOLIN  HFA) 108 (90 Base) MCG/ACT inhaler  . ALPRAZolam  (XANAX ) 0.5 MG tablet  . Biotin  10000 MCG TABS  . Cholecalciferol  (VITAMIN D -3) 125 MCG (5000 UT) TABS  . cyanocobalamin  (VITAMIN B12) 1000 MCG tablet  . EPINEPHrine  0.3 mg/0.3 mL IJ SOAJ injection  . escitalopram  (LEXAPRO ) 20 MG tablet  . esomeprazole  (NEXIUM ) 40 MG capsule  . fluticasone  (FLONASE ) 50 MCG/ACT nasal spray  . GABAPENTIN PO  . ibuprofen  (ADVIL ) 800 MG tablet  . levocetirizine (XYZAL) 5 MG tablet  . levothyroxine  (SYNTHROID ) 25 MCG tablet  . loratadine  (CLARITIN ) 10 MG tablet  . montelukast  (SINGULAIR ) 10 MG tablet  . ondansetron  (ZOFRAN ) 4 MG tablet  . rOPINIRole  (REQUIP ) 4 MG tablet  . rosuvastatin  (CRESTOR ) 5 MG tablet  . sucralfate  (CARAFATE ) 1 g tablet  . valACYclovir  (VALTREX ) 1000 MG tablet   No current facility-administered medications for this encounter.   ASA is on hold for surgery, last dose 05/15/2024.

## 2024-05-18 NOTE — Progress Notes (Signed)
 Surgical Instructions     Your procedure is scheduled on Monday May 22, 2024. Report to [facility] Main Entrance A at 5:30 A.M., then check in with the Admitting office. Any questions or running late day of surgery: call [phone]   Questions prior to your surgery date: call [phone], Monday-Friday, 8am-4pm. If you experience any cold or flu symptoms such as cough, fever, chills, shortness of breath, etc. between now and your scheduled surgery, please notify us  at the above number.          Remember:       Do not eat or drink  after midnight the night before your surgery   Take these medicines the morning of surgery with A SIP OF WATER   Airsupra Inhaler escitalopram  (LEXAPRO )  esomeprazole  (NEXIUM )  levothyroxine  (SYNTHROID )  loratadine  (CLARITIN )  valACYclovir  (VALTREX )     May take these medicines IF NEEDED: acetaminophen  (TYLENOL )  albuterol  (PROVENTIL  HFA;VENTOLIN  HFA) 108 (90 Base) MCG/ACT inhaler Please bring with you to the hospital fluticasone  (FLONASE )  ondansetron  (ZOFRAN )     One week prior to surgery, STOP taking any Aspirin  (unless otherwise instructed by your surgeon) Aleve, Naproxen, Ibuprofen , Motrin , Advil , Goody's, BC's, all herbal medications, fish oil, and non-prescription vitamins.                     Do NOT Smoke (Tobacco/Vaping) for 24 hours prior to your procedure.   If you use a CPAP at night, you may bring your mask/headgear for your overnight stay.   You will be asked to remove any contacts, glasses, piercing's, hearing aid's, dentures/partials prior to surgery. Please bring cases for these items if needed.    Patients discharged the day of surgery will not be allowed to drive home, and someone needs to stay with them for 24 hours.   SURGICAL WAITING ROOM VISITATION Patients may have no more than 2 support people in the waiting area - these visitors may rotate.   Pre-op nurse will coordinate an appropriate time for 1 ADULT support  person, who may not rotate, to accompany patient in pre-op.  Children under the age of 66 must have an adult with them who is not the patient and must remain in the main waiting area with an adult.   If the patient needs to stay at the hospital during part of their recovery, the visitor guidelines for inpatient rooms apply.   Please refer to the [facility] website for the visitor guidelines for any additional information.     If you received a COVID test during your pre-op visit  it is requested that you wear a mask when out in public, stay away from anyone that may not be feeling well and notify your surgeon if you develop symptoms. If you have been in contact with anyone that has tested positive in the last 10 days please notify you surgeon.         Pre-operative 5 CHG Bathing Instructions    You can play a key role in reducing the risk of infection after surgery. Your skin needs to be as free of germs as possible. You can reduce the number of germs on your skin by washing with CHG (chlorhexidine  gluconate) soap before surgery. CHG is an antiseptic soap that kills germs and continues to kill germs even after washing.    DO NOT use if you have an allergy to chlorhexidine /CHG or antibacterial soaps. If your skin becomes reddened or irritated, stop using the CHG and  notify one of our RNs at [phone].    Please shower with the CHG soap starting 4 days before surgery using the following schedule:       Please keep in mind the following:  DO NOT shave, including legs and underarms, starting the day of your first shower.   Place clean sheets on your bed the day you start using CHG soap. Use a clean washcloth (not used since being washed) for each shower. DO NOT sleep with pets once you start using the CHG.    CHG Shower Instructions:  Wash your face and private area with normal soap. If you choose to wash your hair, wash first with your normal shampoo.  After you use shampoo/soap,  rinse your hair and body thoroughly to remove shampoo/soap residue.  Turn the water  OFF and apply about 3 tablespoons (45 ml) of CHG soap to a CLEAN washcloth.  Apply CHG soap ONLY FROM YOUR NECK DOWN TO YOUR TOES (washing for 3-5 minutes)  DO NOT use CHG soap on face, private areas, open wounds, or sores.  Pay special attention to the area where your surgery is being performed.  If you are having back surgery, having someone wash your back for you may be helpful. Wait 2 minutes after CHG soap is applied, then you may rinse off the CHG soap.  Pat dry with a clean towel  Put on clean clothes/pajamas   If you choose to wear lotion, please use ONLY the CHG-compatible lotions that are listed below.   Additional instructions for the day of surgery: DO NOT APPLY any lotions, deodorants or perfumes.   Do not bring valuables to the hospital. [facility] is not responsible for any belongings/valuables. Do not wear nail polish, gel polish, artificial nails, or any other type of covering on natural nails (fingers and toes) Do not wear jewelry or makeup Put on clean/comfortable clothes.  Please brush your teeth.  Ask your nurse before applying any prescription medications to the skin.        CHG Compatible Lotions    Aveeno Moisturizing lotion  Cetaphil Moisturizing Cream  Cetaphil Moisturizing Lotion  Clairol Herbal Essence Moisturizing Lotion, Dry Skin  Clairol Herbal Essence Moisturizing Lotion, Extra Dry Skin  Clairol Herbal Essence Moisturizing Lotion, Normal Skin  Curel Age Defying Therapeutic Moisturizing Lotion with Alpha Hydroxy  Curel Extreme Care Body Lotion  Curel Soothing Hands Moisturizing Hand Lotion  Curel Therapeutic Moisturizing Cream, Fragrance-Free  Curel Therapeutic Moisturizing Lotion, Fragrance-Free  Curel Therapeutic Moisturizing Lotion, Original Formula  Eucerin Daily Replenishing Lotion  Eucerin Dry Skin Therapy Plus Alpha Hydroxy Crme  Eucerin Dry Skin Therapy  Plus Alpha Hydroxy Lotion  Eucerin Original Crme  Eucerin Original Lotion  Eucerin Plus Crme Eucerin Plus Lotion  Eucerin TriLipid Replenishing Lotion  Keri Anti-Bacterial Hand Lotion  Keri Deep Conditioning Original Lotion Dry Skin Formula Softly Scented  Keri Deep Conditioning Original Lotion, Fragrance Free Sensitive Skin Formula  Keri Lotion Fast Absorbing Fragrance Free Sensitive Skin Formula  Keri Lotion Fast Absorbing Softly Scented Dry Skin Formula  Keri Original Lotion  Keri Skin Renewal Lotion Keri Silky Smooth Lotion  Keri Silky Smooth Sensitive Skin Lotion  Nivea Body Creamy Conditioning Oil  Nivea Body Extra Enriched Teacher, adult education Moisturizing Lotion Nivea Crme  Nivea Skin Firming Lotion  NutraDerm 30 Skin Lotion  NutraDerm Skin Lotion  NutraDerm Therapeutic Skin Cream  NutraDerm Therapeutic Skin Lotion  ProShield Protective Hand Cream  Provon  moisturizing lotion   Please read over the following fact sheets that you were given.

## 2024-05-18 NOTE — Progress Notes (Signed)
 PCP -  Dr. Norleen General Cardiologist - denies Neurologist - Dr. Pastor Falling Allergist - Dr. Lamar Fleeta Smock Endocrinologist - Dr. Littie Caffey   PPM/ICD - denies   Chest x-ray - denies EKG - 05/18/24 Stress Test - 09/26/18 ECHO - 06/17/22 Cardiac Cath - denies  Sleep Study - denies  No DM  Last dose of GLP1 agonist-  n/a GLP1 instructions:  n/a  Blood Thinner Instructions: n/a Aspirin  Instructions: stopped taking approximately 9/1  ERAS Protcol - NPO PRE-SURGERY Ensure or G2- n/a  COVID TEST- n/a   Anesthesia review: yes - seen by Allison Zelenak at PAT appointment   Patient denies shortness of breath, fever, cough and chest pain at PAT appointment   All instructions explained to the patient, with a verbal understanding of the material. Patient agrees to go over the instructions while at home for a better understanding. Patient also instructed to self quarantine after being tested for COVID-19. The opportunity to ask questions was provided.

## 2024-05-18 NOTE — Progress Notes (Signed)
 Anesthesia APP PAT Evaluation:  Case: [id number] Date/Time: 05/22/24 0715   Procedures:      ANTERIOR LUMBAR FUSION 1 LEVEL - L5-S1 ALIF     ANTERIOR LATERAL LUMBAR FUSION 3 LEVELS (Right) - RT, L12, L23, L34 DLIF     ABDOMINAL EXPOSURE   Anesthesia type: General   Diagnosis: Other idiopathic scoliosis, lumbar region [M41.26]   Pre-op diagnosis: IDIOPATHIC SCOLIOSIS, LUMBAR REGION   [facility] / [facility]   Surgeons: Debby Dorn MATSU, MD; Gretta Lonni PARAS, MD       DISCUSSION: Patient is a 68 year old female scheduled for the above procedure.  History includes former smoker (quit 05/09/1999), post-operative N/V, TIA (06/16/2022), GERD, hiatal hernia (s/p laparoscopic repair, Nissen fundoplication 11/27/2016), anxiety, allergic rhinitis/asthma, adrenal insufficiency (post steroid injection 2017), hypothyroidism, Guillain-Barre syndrome (1970's post swine flu vaccine), chronic bronchitis, asthma, RLS, IBS, childhood hepatitis A, spinal surgery (C5-7 ACDF ~2004; L4-5 PLIF 06/22/2013), osteoarthritis (right reverse TSA 02/21/2016).  I evaluated her during her 05/18/2024 PAT visit due to what sounds like fairly recent bronchitis following a beach trip. Symptoms started around 04/29/2024. She denied fever and sore throat. She did take and complete a home prescription of doxycycline 100 mg BID. She is back to baseline now and if no changes will be at baseline for 2 weeks by her surgery date. Given her chronic allergic rhinitis, she does have a chronic intermittent cough even at baseline. She reported allergens including mold, dust, mildew, trees, grass. Symptoms typically worse in the summer months. She follows with an immunologist and receives weekly immunotherapy injections. Pulmonology/allergy medications include Airsupra daily, as needed albuterol , Flonases as needed, Xyzal daily, Claritin  daily. She is not currently taking Singulair . She is also on a PPI.  She denied chest pain or SOB. She  may experience some DOE when the weather is hot and humid or with an acute allergy exacerbation, otherwise no limitations from a breathing standpoint. She uses her rescue inhaler as needed, typically a few times a month and more often with URI symptoms. She was using about once a days during her acute symptoms. She does not do much work outdoors due to her allergies, but is able to do her own house cleaning.     She sees endocrinologist Dr. Tommas yearly, last visit 03/16/2024. Adrenal insufficiency episode was in 2017 and in the setting of frequent steroid injections for back and/or shoulder pain. She was put on Cortef  then, but she says she has not been on steroids for years or required stress steroids recently.   She is not followed by cardiology routinely, but had a normal ETT on 09/26/2018 for atypical chest pain evaluation with as needed follow-up. She also had an event monitor and TTE in 06/2022 in setting of TIA. Event monitor showed no malignant arrhythmias. 06/17/2022 TTE showed LVEF 55-60%, no RWMA, grade 1 DD, low normal RV systolic function, normal PASP, trivial MR. She had a carotid US  on 05/01/2024 that showed mild (1-49%) stenosis proximal left ICA secondary to small focal smooth heterogenous plaque and no significant stenosis involving the right ICA.  She recently had a lung cancer screening chest CT on 05/09/2024, but the results are still in process.   She appears well and back to baseline at her PAT visit. If no acute changes then I would anticipate that she can proceed as planned.    VS: BP 136/73   Pulse 65   Temp 36.9 C   Resp 18  Ht 5' 7 (1.702 m)   Wt 70.2 kg   SpO2 98%   BMI 24.23 kg/m  Provider wore face mask. Heart RRR, no murmur. Lungs clear, no wheezes, rhonchi or crackles. No carotid bruits. No ankle edema. Good mouth opening. She denied neck mobility restrictions.   PROVIDERS: Marvine Rush, MD is PCP  Tommas Pears, MD is endocrinology Eartha Flavors,  Toribio, MD is GI  Gregg Lek, MD is neurologist Fleeta Smock, [place], MD is allergist - She is not routinely followed by cardiology, but she had an evaluation by Charls Bender, MD in 2020 for palpitations and atypical chest pain. She had a normal ETT, and as needed cardiology follow-up advised at 01/03/2019 visit.     LABS: Labs reviewed: Acceptable for surgery. (all labs ordered are listed, but only abnormal results are displayed)  Labs Reviewed  COMPREHENSIVE METABOLIC PANEL WITH GFR - Abnormal; Notable for the following components:      Result Value   Sodium 133 (*)    All other components within normal limits  CBC - Abnormal; Notable for the following components:   RBC 3.86 (*)    All other components within normal limits  SURGICAL PCR SCREEN  TYPE AND SCREEN   On 03/01/2024 Cortisol 10.4, ACTH  14.7, TSH 1.380,    OTHER: EEG 06/17/2022: IMPRESSION: - This study is within normal limits. No seizures or epileptiform discharges were seen throughout the recording. - A normal interictal EEG does not exclude the diagnosis of epilepsy.   IMAGES: CT Chest LCS 05/09/2024: In process.  NM Gastric Emptying Study 02/03/2024: IMPRESSION: No scintigraphic evidence of delayed gastric emptying.  MRI C-spine 07/14/2023: IMPRESSION: 1. Prior fusion at C4 through C7 without residual spinal stenosis. Uncovertebral spurring with residual mild to moderate right C7 foraminal narrowing. 2. Adjacent segment disease at C3-4 with resultant severe right and mild to moderate left C4 foraminal stenosis. 3. Right paracentral to foraminal disc protrusion at T1-2 with resultant moderate to severe right foraminal stenosis.   MRI L-spine 06/01/2023: IMPRESSION: 1. Status post L4-5 discectomy and fusion without stenosis. 2. Spondylosis at L3-4 appears worse than on the prior exam. Moderate central canal stenosis is present. Mild to moderate foraminal narrowing is greater on the right. 3. No change  in mild to moderate central canal stenosis at L1-2. 4. No change in moderate narrowing in the right subarticular recess and foramen at L2-3.    EKG: 05/18/2024:  Sinus bradycardia at 58 bpm  Low voltage QRS Borderline ECG When compared with ECG of 16-Jun-2022 22:03, No significant change since last tracing Confirmed by Waddell Lusher [phone]) on 05/18/2024 7:00:05 PM   CV: US  Carotid 05/01/2024: IMPRESSION: 1. Mild (1-49%) stenosis proximal left internal carotid artery secondary to small focal smooth heterogeneous atherosclerotic plaque. 2. No significant atherosclerotic plaque or evidence of stenosis in the right internal carotid artery. 3. Vertebral arteries are patent with normal antegrade flow.    Cardiac Event Monitor 10/272023 - 08/01/2022:   Patient had a minimum heart rate of 49 bpm, maximum heart rate of 117 bpm, and average heart rate of 71 bpm.   Predominant underlying rhythm was sinus rhythm.   One four beat run of NSVT.   No atrial fibrillation or flutter.   Isolated PACs were rare (<1.0%).   Isolated PVCs were rare (<1.0%).   Triggered and diary events associated with sinus bradycardia, sinus rhythm, and PACs.   No malignant arrhythmias.   Echo 06/17/2022: IMPRESSIONS   1. Left ventricular ejection fraction, by  estimation, is 55 to 60%. The  left ventricle has normal function. The left ventricle has no regional  wall motion abnormalities. Left ventricular diastolic parameters are  consistent with Grade I diastolic  dysfunction (impaired relaxation).   2. Right ventricular systolic function is low normal. The right  ventricular size is normal. There is normal pulmonary artery systolic  pressure.   3. The mitral valve is grossly normal. Trivial mitral valve  regurgitation.   4. The aortic valve is tricuspid. Aortic valve regurgitation is not  visualized.   5. The inferior vena cava is normal in size with greater than 50%  respiratory variability, suggesting  right atrial pressure of 3 mmHg.  - Comparison(s): No prior Echocardiogram.    ETT 09/26/2018: Blood pressure demonstrated a hypertensive response to exercise. There was no ST segment deviation noted during stress.   Normal ETT No significant arrhythmia Normal hemodynamic response to exercise   Past Medical History:  Diagnosis Date   Adrenal insufficiency (HCC)    occured following steroid shoulder injections ~ spring 2017   Anxiety    pt. reports that she has RLS- takes Xanax  for calming her self so she can settle & sleep    Arthritis    shoulder & back, knees & leg     Asthma    aleery related   Atypical mole 11/12/2020   mod-severe- mid back   Chest pain, atypical 2020   had cardiac workup - deemed related to anxiety   Chronic bronchitis (HCC)    less since I quit smoking (02/21/2016)   Chronic lower back pain    Dyspnea    episodic with allergen triggers   GERD (gastroesophageal reflux disease)    Guillain-Barre syndrome following vaccination (HCC) 1970's   post swine flu shot, hosp. for treatment for 1 month    H/O dizziness    H/O hiatal hernia    Hepatitis    hepatitis as a child; got it from a little girl at school make sick and vomiting   Herpes simplex    History of Guillain-Barre syndrome ~ 1978   from the swine flu shot   Hypothyroidism    IBS (irritable bowel syndrome)    Pneumonia    PONV (postoperative nausea and vomiting)    Restless leg syndrome    TIA (transient ischemic attack)    Walking pneumonia ~ 2011    Past Surgical History:  Procedure Laterality Date   19 HOUR PH STUDY N/A 09/23/2016   Procedure: 24 HOUR PH STUDY;  Surgeon: Gustav Shila GAILS, MD;  Location: [facility];  Service: Endoscopy;  Laterality: N/A;   ANTERIOR CERVICAL DECOMP/DISCECTOMY FUSION  2000 & 2015   two times ago- C5 and C6, Dr. Lynwood JONELLE Mill, MD 2015   APPENDECTOMY  1980s   BACK SURGERY     BIOPSY  03/24/2022   Procedure: BIOPSY;  Surgeon: Eartha Angelia Sieving, MD;  [facility]: [facility];  Service: Gastroenterology;;   BREAST BIOPSY Right    benign   CHOLECYSTECTOMY OPEN  1980s   COLONOSCOPY N/A 09/20/2014   rehman: Prep was excellent except she had thick layer of stool coating cecal and ascending colon mucosa. Cecal landmarks were well identified after vigorous washing. Small lymphoid polyp ablated via cold biopsy from appendiceal stump.Mucosa of rest of the colon was normal. Normal mucosa of rectum and anorectal junction.   ESOPHAGEAL DILATION N/A 05/29/2019   Procedure: ESOPHAGEAL DILATION;  Surgeon: Golda Claudis PENNER, MD;  [facility];  Service: Endoscopy;  Laterality: N/A;   ESOPHAGEAL MANOMETRY N/A 09/23/2016   Procedure: ESOPHAGEAL MANOMETRY (EM);  Surgeon: Gustav Shila GAILS, MD;  Location: [facility];  Service: Endoscopy;  Laterality: N/A;   ESOPHAGOGASTRODUODENOSCOPY N/A 09/20/2014   Procedure: ESOPHAGOGASTRODUODENOSCOPY (EGD);  Surgeon: Claudis RAYMOND Rivet, MD;  [facility];  Service: Endoscopy;  Laterality: N/A;   ESOPHAGOGASTRODUODENOSCOPY (EGD) WITH PROPOFOL  N/A 05/29/2019   - Z-line, 38 cm from the incisors.   ESOPHAGOGASTRODUODENOSCOPY (EGD) WITH PROPOFOL  N/A 03/24/2022   Procedure: ESOPHAGOGASTRODUODENOSCOPY (EGD) WITH PROPOFOL ;  Surgeon: Eartha Angelia Sieving, MD;  [facility];  Service: Gastroenterology;  Laterality: N/A;  730 ASA 1   HIATAL HERNIA REPAIR N/A 11/27/2016   Procedure: LAPAROSCOPIC REPAIR OF HIATAL HERNIA WITH NISSEN FUNDOPLICATION;  Surgeon: Krystal Russell, MD;  Location: [facility];  Service: General;  Laterality: N/A;   INCISION AND DRAINAGE OF WOUND Left 02/21/2016   forearm, minor   IR ANGIO INTRA EXTRACRAN SEL INTERNAL CAROTID BILAT MOD SED  11/03/2022   IR ANGIO VERTEBRAL SEL VERTEBRAL UNI L MOD SED  11/03/2022   LUMBAR LAMINECTOMY/DECOMPRESSION MICRODISCECTOMY Right 06/22/2013   Procedure: RIGHT LUMBAR TWO-THREE DISCECTOMY;  Surgeon: Lynwood JONELLE Mill, MD;   [facility]: [facility];  Service: Neurosurgery;  Laterality: Right;  Right    MALONEY DILATION N/A 09/20/2014   Procedure: AGAPITO DILATION;  Surgeon: Claudis RAYMOND Rivet, MD;  [facility];  Service: Endoscopy;  Laterality: N/A;   MINOR IRRIGATION AND DEBRIDEMENT OF WOUND Left 02/21/2016   Procedure: MINOR IRRIGATION AND DEBRIDEMENT/REPAIR OF LEFT ARM WOUND;  Surgeon: Marcey Her, MD;  Location: [facility];  Service: Orthopedics;  Laterality: Left;   REVERSE SHOULDER ARTHROPLASTY Right 02/21/2016   Procedure: RIGHT REVERSE TOTAL SHOULDER ARTHROPLASTY;  Surgeon: Marcey Her, MD;  Location: [facility] OR;  Service: Orthopedics;  Laterality: Right;   REVERSE TOTAL SHOULDER ARTHROPLASTY Right 02/21/2016   SAVORY DILATION  03/24/2022   Procedure: SAVORY DILATION;  Surgeon: Eartha Angelia, Sieving, MD;  [facility];  Service: Gastroenterology;;   SHOULDER ARTHROSCOPY W/ ROTATOR CUFF REPAIR Right 1990s   TOTAL SHOULDER REPLACEMENT     Right   VAGINAL HYSTERECTOMY      MEDICATIONS:  aspirin  EC 81 MG tablet   acetaminophen  (TYLENOL ) 500 MG tablet   AIRSUPRA 90-80 MCG/ACT AERO   albuterol  (PROVENTIL  HFA;VENTOLIN  HFA) 108 (90 Base) MCG/ACT inhaler   ALPRAZolam  (XANAX ) 0.5 MG tablet   Biotin  10000 MCG TABS   Cholecalciferol  (VITAMIN D -3) 125 MCG (5000 UT) TABS   cyanocobalamin  (VITAMIN B12) 1000 MCG tablet   EPINEPHrine  0.3 mg/0.3 mL IJ SOAJ injection   escitalopram  (LEXAPRO ) 20 MG tablet   esomeprazole  (NEXIUM ) 40 MG capsule   fluticasone  (FLONASE ) 50 MCG/ACT nasal spray   GABAPENTIN PO   ibuprofen  (ADVIL ) 800 MG tablet   levocetirizine (XYZAL) 5 MG tablet   levothyroxine  (SYNTHROID ) 25 MCG tablet   loratadine  (CLARITIN ) 10 MG tablet   montelukast  (SINGULAIR ) 10 MG tablet   ondansetron  (ZOFRAN ) 4 MG tablet   rOPINIRole  (REQUIP ) 4 MG tablet   rosuvastatin  (CRESTOR ) 5 MG tablet   sucralfate  (CARAFATE ) 1 g tablet   valACYclovir  (VALTREX ) 1000 MG tablet   No current  facility-administered medications for this encounter.   ASA is on hold for surgery, last dose 05/15/2024.   Isaiah Ruder, PA-C Surgical Short Stay/Anesthesiology [facility] Phone [phone] [facility] Phone [phone] 05/19/2024 9:31 AM

## 2024-05-19 ENCOUNTER — Encounter (HOSPITAL_COMMUNITY): Payer: Self-pay

## 2024-05-19 DIAGNOSIS — J3081 Allergic rhinitis due to animal (cat) (dog) hair and dander: Secondary | ICD-10-CM | POA: Diagnosis not present

## 2024-05-19 DIAGNOSIS — J3089 Other allergic rhinitis: Secondary | ICD-10-CM | POA: Diagnosis not present

## 2024-05-19 DIAGNOSIS — J301 Allergic rhinitis due to pollen: Secondary | ICD-10-CM | POA: Diagnosis not present

## 2024-05-19 NOTE — Anesthesia Preprocedure Evaluation (Addendum)
 Anesthesia Evaluation  Patient identified by MRN, date of birth, ID band Patient awake    Reviewed: Allergy & Precautions, NPO status , Patient's Chart, lab work & pertinent test results  History of Anesthesia Complications (+) PONV and history of anesthetic complications  Airway Mallampati: II  TM Distance: >3 FB Neck ROM: Limited    Dental no notable dental hx.    Pulmonary asthma , former smoker   Pulmonary exam normal        Cardiovascular negative cardio ROS  Rhythm:Regular Rate:Normal     Neuro/Psych   Anxiety Depression    TIA   GI/Hepatic Neg liver ROS, hiatal hernia,GERD  Medicated,,  Endo/Other  Hypothyroidism    Renal/GU negative Renal ROS     Musculoskeletal   Abdominal Normal abdominal exam  (+)   Peds  Hematology Lab Results      Component                Value               Date                      WBC                      8.0                 05/18/2024                HGB                      12.5                05/18/2024                HCT                      37.7                05/18/2024                MCV                      97.7                05/18/2024                PLT                      302                 05/18/2024             Lab Results      Component                Value               Date                      NA                       133 (L)             05/18/2024                K  4.7                 05/18/2024                CO2                      26                  05/18/2024                GLUCOSE                  95                  05/18/2024                BUN                      9                   05/18/2024                CREATININE               0.92                05/18/2024                CALCIUM                   9.1                 05/18/2024                GFRNONAA                 >60                 05/18/2024              Anesthesia  Other Findings   Reproductive/Obstetrics                              Anesthesia Physical Anesthesia Plan  ASA: 3  Anesthesia Plan: General   Post-op Pain Management: Celebrex  PO (pre-op)*, Ketamine  IV* and Tylenol  PO (pre-op)*   Induction: Intravenous  PONV Risk Score and Plan: 4 or greater and Ondansetron , Dexamethasone , Treatment may vary due to age or medical condition, Midazolam  and Amisulpride   Airway Management Planned: Mask, Oral ETT and Video Laryngoscope Planned  Additional Equipment: Arterial line  Intra-op Plan:   Post-operative Plan: Extubation in OR  Informed Consent: I have reviewed the patients History and Physical, chart, labs and discussed the procedure including the risks, benefits and alternatives for the proposed anesthesia with the patient or authorized representative who has indicated his/her understanding and acceptance.     Dental advisory given  Plan Discussed with: CRNA  Anesthesia Plan Comments: (PAT note written 05/19/2024 by Allison Zelenak, PA-C.  )         Anesthesia Quick Evaluation

## 2024-05-22 ENCOUNTER — Inpatient Hospital Stay (HOSPITAL_COMMUNITY)

## 2024-05-22 ENCOUNTER — Inpatient Hospital Stay (HOSPITAL_COMMUNITY)
Admission: RE | Admit: 2024-05-22 | Discharge: 2024-05-31 | DRG: 427 | Disposition: A | Discharge status: Home Health | Source: Ambulatory Visit | Attending: Vascular Surgery | Admitting: Vascular Surgery

## 2024-05-22 ENCOUNTER — Other Ambulatory Visit: Payer: Self-pay

## 2024-05-22 ENCOUNTER — Inpatient Hospital Stay (HOSPITAL_COMMUNITY): Payer: Self-pay | Admitting: Anesthesiology

## 2024-05-22 ENCOUNTER — Encounter (HOSPITAL_COMMUNITY)
Admission: RE | Disposition: A | Discharge status: Home Health | Payer: Self-pay | Source: Ambulatory Visit | Attending: Neurosurgery

## 2024-05-22 ENCOUNTER — Encounter (HOSPITAL_COMMUNITY): Payer: Self-pay

## 2024-05-22 ENCOUNTER — Inpatient Hospital Stay (HOSPITAL_COMMUNITY): Payer: Self-pay | Admitting: Vascular Surgery

## 2024-05-22 DIAGNOSIS — B009 Herpesviral infection, unspecified: Secondary | ICD-10-CM | POA: Diagnosis present

## 2024-05-22 DIAGNOSIS — E039 Hypothyroidism, unspecified: Secondary | ICD-10-CM | POA: Diagnosis present

## 2024-05-22 DIAGNOSIS — Z885 Allergy status to narcotic agent status: Secondary | ICD-10-CM

## 2024-05-22 DIAGNOSIS — M2578 Osteophyte, vertebrae: Secondary | ICD-10-CM | POA: Diagnosis present

## 2024-05-22 DIAGNOSIS — Z96611 Presence of right artificial shoulder joint: Secondary | ICD-10-CM | POA: Diagnosis present

## 2024-05-22 DIAGNOSIS — Z7982 Long term (current) use of aspirin: Secondary | ICD-10-CM

## 2024-05-22 DIAGNOSIS — M4126 Other idiopathic scoliosis, lumbar region: Secondary | ICD-10-CM | POA: Diagnosis present

## 2024-05-22 DIAGNOSIS — Z79899 Other long term (current) drug therapy: Secondary | ICD-10-CM

## 2024-05-22 DIAGNOSIS — Z9049 Acquired absence of other specified parts of digestive tract: Secondary | ICD-10-CM | POA: Diagnosis not present

## 2024-05-22 DIAGNOSIS — E2740 Unspecified adrenocortical insufficiency: Secondary | ICD-10-CM | POA: Diagnosis present

## 2024-05-22 DIAGNOSIS — F418 Other specified anxiety disorders: Secondary | ICD-10-CM

## 2024-05-22 DIAGNOSIS — Z833 Family history of diabetes mellitus: Secondary | ICD-10-CM | POA: Diagnosis not present

## 2024-05-22 DIAGNOSIS — M170 Bilateral primary osteoarthritis of knee: Secondary | ICD-10-CM | POA: Diagnosis present

## 2024-05-22 DIAGNOSIS — M5116 Intervertebral disc disorders with radiculopathy, lumbar region: Secondary | ICD-10-CM | POA: Diagnosis not present

## 2024-05-22 DIAGNOSIS — G8929 Other chronic pain: Secondary | ICD-10-CM | POA: Diagnosis present

## 2024-05-22 DIAGNOSIS — J45909 Unspecified asthma, uncomplicated: Secondary | ICD-10-CM | POA: Diagnosis not present

## 2024-05-22 DIAGNOSIS — Z79890 Hormone replacement therapy (postmenopausal): Secondary | ICD-10-CM | POA: Diagnosis not present

## 2024-05-22 DIAGNOSIS — R339 Retention of urine, unspecified: Secondary | ICD-10-CM | POA: Diagnosis not present

## 2024-05-22 DIAGNOSIS — F32A Depression, unspecified: Secondary | ICD-10-CM | POA: Diagnosis present

## 2024-05-22 DIAGNOSIS — Z888 Allergy status to other drugs, medicaments and biological substances status: Secondary | ICD-10-CM | POA: Diagnosis not present

## 2024-05-22 DIAGNOSIS — J4489 Other specified chronic obstructive pulmonary disease: Secondary | ICD-10-CM | POA: Diagnosis present

## 2024-05-22 DIAGNOSIS — Z8673 Personal history of transient ischemic attack (TIA), and cerebral infarction without residual deficits: Secondary | ICD-10-CM

## 2024-05-22 DIAGNOSIS — M4726 Other spondylosis with radiculopathy, lumbar region: Principal | ICD-10-CM | POA: Diagnosis present

## 2024-05-22 DIAGNOSIS — Z87891 Personal history of nicotine dependence: Secondary | ICD-10-CM | POA: Diagnosis not present

## 2024-05-22 DIAGNOSIS — G2581 Restless legs syndrome: Secondary | ICD-10-CM | POA: Diagnosis present

## 2024-05-22 DIAGNOSIS — M4316 Spondylolisthesis, lumbar region: Secondary | ICD-10-CM | POA: Diagnosis not present

## 2024-05-22 DIAGNOSIS — Z90710 Acquired absence of both cervix and uterus: Secondary | ICD-10-CM

## 2024-05-22 DIAGNOSIS — M4106 Infantile idiopathic scoliosis, lumbar region: Secondary | ICD-10-CM | POA: Diagnosis not present

## 2024-05-22 DIAGNOSIS — F419 Anxiety disorder, unspecified: Secondary | ICD-10-CM | POA: Diagnosis present

## 2024-05-22 DIAGNOSIS — Z8249 Family history of ischemic heart disease and other diseases of the circulatory system: Secondary | ICD-10-CM | POA: Diagnosis not present

## 2024-05-22 DIAGNOSIS — M48061 Spinal stenosis, lumbar region without neurogenic claudication: Secondary | ICD-10-CM | POA: Diagnosis not present

## 2024-05-22 DIAGNOSIS — G459 Transient cerebral ischemic attack, unspecified: Secondary | ICD-10-CM | POA: Diagnosis not present

## 2024-05-22 DIAGNOSIS — K219 Gastro-esophageal reflux disease without esophagitis: Secondary | ICD-10-CM | POA: Diagnosis present

## 2024-05-22 DIAGNOSIS — Z981 Arthrodesis status: Secondary | ICD-10-CM

## 2024-05-22 DIAGNOSIS — M545 Low back pain, unspecified: Secondary | ICD-10-CM | POA: Diagnosis not present

## 2024-05-22 DIAGNOSIS — M419 Scoliosis, unspecified: Secondary | ICD-10-CM | POA: Diagnosis not present

## 2024-05-22 HISTORY — PX: ANTERIOR LUMBAR FUSION: SHX1170

## 2024-05-22 HISTORY — PX: ABDOMINAL EXPOSURE: SHX5708

## 2024-05-22 HISTORY — PX: ANTERIOR LAT LUMBAR FUSION: SHX1168

## 2024-05-22 SURGERY — ANTERIOR LUMBAR FUSION 1 LEVEL
Anesthesia: General | Laterality: Right

## 2024-05-22 MED ORDER — ACETAMINOPHEN 650 MG RE SUPP: 650.0000 mg | RECTAL | Status: DC | PRN

## 2024-05-22 MED ORDER — MIDAZOLAM HCL 2 MG/2ML IJ SOLN
INTRAMUSCULAR | Status: AC
  Filled 2024-05-22: qty 2

## 2024-05-22 MED ORDER — ROPINIROLE HCL 1 MG PO TABS
6.0000 mg | ORAL_TABLET | Freq: Every day | ORAL | Status: DC
  Administered 2024-05-22 – 2024-05-31 (×9): 6 mg via ORAL
  Filled 2024-05-22 (×9): qty 6

## 2024-05-22 MED ORDER — OXYCODONE HCL 5 MG PO TABS
5.0000 mg | ORAL_TABLET | ORAL | Status: DC | PRN
  Administered 2024-05-22: 5 mg via ORAL
  Filled 2024-05-22: qty 1

## 2024-05-22 MED ORDER — LACTATED RINGERS IV SOLN
INTRAVENOUS | Status: DC | PRN
Start: 2024-05-22 — End: 2024-05-22

## 2024-05-22 MED ORDER — HYDROMORPHONE HCL 1 MG/ML IJ SOLN
INTRAMUSCULAR | Status: DC | PRN
  Administered 2024-05-22 (×2): .5 mg via INTRAVENOUS

## 2024-05-22 MED ORDER — PROPOFOL 10 MG/ML IV BOLUS
INTRAVENOUS | Status: AC
  Filled 2024-05-22: qty 20

## 2024-05-22 MED ORDER — PHENYLEPHRINE HCL-NACL 20-0.9 MG/250ML-% IV SOLN
INTRAVENOUS | Status: DC | PRN
  Administered 2024-05-22: 30 ug/min via INTRAVENOUS

## 2024-05-22 MED ORDER — METHOCARBAMOL 500 MG PO TABS
500.0000 mg | ORAL_TABLET | Freq: Four times a day (QID) | ORAL | Status: DC | PRN
  Administered 2024-05-22 – 2024-05-25 (×8): 500 mg via ORAL
  Filled 2024-05-22 (×8): qty 1

## 2024-05-22 MED ORDER — SODIUM CHLORIDE 0.9% FLUSH: 3.0000 mL | INTRAVENOUS | Status: DC | PRN

## 2024-05-22 MED ORDER — MIDAZOLAM HCL 2 MG/2ML IJ SOLN
INTRAMUSCULAR | Status: DC | PRN
  Administered 2024-05-22: 2 mg via INTRAVENOUS

## 2024-05-22 MED ORDER — PHENOL 1.4 % MT LIQD: 1.0000 | OROMUCOSAL | Status: DC | PRN

## 2024-05-22 MED ORDER — CHLORHEXIDINE GLUCONATE CLOTH 2 % EX PADS: 6.0000 | MEDICATED_PAD | Freq: Once | CUTANEOUS | Status: DC

## 2024-05-22 MED ORDER — HYDROMORPHONE HCL 1 MG/ML IJ SOLN
INTRAMUSCULAR | Status: AC
  Filled 2024-05-22: qty 1

## 2024-05-22 MED ORDER — ACETAMINOPHEN 10 MG/ML IV SOLN
INTRAVENOUS | Status: AC
  Filled 2024-05-22: qty 100

## 2024-05-22 MED ORDER — LIDOCAINE 2% (20 MG/ML) 5 ML SYRINGE
INTRAMUSCULAR | Status: DC | PRN
  Administered 2024-05-22: 100 mg via INTRAVENOUS

## 2024-05-22 MED ORDER — PROPOFOL 10 MG/ML IV BOLUS
INTRAVENOUS | Status: DC | PRN
Start: 2024-05-22 — End: 2024-05-22
  Administered 2024-05-22: 100 mg via INTRAVENOUS

## 2024-05-22 MED ORDER — ONDANSETRON HCL 4 MG/2ML IJ SOLN
INTRAMUSCULAR | Status: DC | PRN
  Administered 2024-05-22: 4 mg via INTRAVENOUS

## 2024-05-22 MED ORDER — CEFAZOLIN SODIUM-DEXTROSE 2-4 GM/100ML-% IV SOLN
2.0000 g | INTRAVENOUS | Status: AC
  Administered 2024-05-22 (×2): 2 g via INTRAVENOUS
  Filled 2024-05-22: qty 100

## 2024-05-22 MED ORDER — GABAPENTIN 100 MG PO CAPS
100.0000 mg | ORAL_CAPSULE | Freq: Every day | ORAL | Status: DC
  Administered 2024-05-22 – 2024-05-25 (×4): 100 mg via ORAL
  Filled 2024-05-22 (×4): qty 1

## 2024-05-22 MED ORDER — VALACYCLOVIR HCL 500 MG PO TABS
1000.0000 mg | ORAL_TABLET | Freq: Every day | ORAL | Status: DC
  Administered 2024-05-24 – 2024-05-31 (×7): 1000 mg via ORAL
  Filled 2024-05-22 (×8): qty 2

## 2024-05-22 MED ORDER — ONDANSETRON HCL 4 MG/2ML IJ SOLN
4.0000 mg | Freq: Four times a day (QID) | INTRAMUSCULAR | Status: DC | PRN
  Administered 2024-05-30: 4 mg via INTRAVENOUS
  Filled 2024-05-22: qty 2

## 2024-05-22 MED ORDER — ESCITALOPRAM OXALATE 10 MG PO TABS
20.0000 mg | ORAL_TABLET | Freq: Every day | ORAL | Status: DC
  Administered 2024-05-23 – 2024-05-31 (×8): 20 mg via ORAL
  Filled 2024-05-22 (×2): qty 2
  Filled 2024-05-22: qty 1
  Filled 2024-05-22 (×3): qty 2
  Filled 2024-05-22: qty 1
  Filled 2024-05-22: qty 2

## 2024-05-22 MED ORDER — THROMBIN 5000 UNITS EX SOLR
OROMUCOSAL | Status: DC | PRN
  Administered 2024-05-22: 5 mL via TOPICAL

## 2024-05-22 MED ORDER — MENTHOL 3 MG MT LOZG: 1.0000 | LOZENGE | OROMUCOSAL | Status: DC | PRN

## 2024-05-22 MED ORDER — PROPOFOL 1000 MG/100ML IV EMUL
INTRAVENOUS | Status: AC
  Filled 2024-05-22: qty 100

## 2024-05-22 MED ORDER — ALUM & MAG HYDROXIDE-SIMETH 200-200-20 MG/5ML PO SUSP: 30.0000 mL | Freq: Four times a day (QID) | ORAL | Status: DC | PRN

## 2024-05-22 MED ORDER — SODIUM CHLORIDE 0.9% FLUSH
3.0000 mL | Freq: Two times a day (BID) | INTRAVENOUS | Status: DC
Start: 2024-05-22 — End: 2024-05-31
  Administered 2024-05-22 – 2024-05-31 (×16): 3 mL via INTRAVENOUS

## 2024-05-22 MED ORDER — SODIUM CHLORIDE 0.9 % IV SOLN
250.0000 mL | INTRAVENOUS | Status: AC
  Administered 2024-05-22: 250 mL via INTRAVENOUS

## 2024-05-22 MED ORDER — FENTANYL CITRATE (PF) 250 MCG/5ML IJ SOLN
INTRAMUSCULAR | Status: AC
  Filled 2024-05-22: qty 5

## 2024-05-22 MED ORDER — 0.9 % SODIUM CHLORIDE (POUR BTL) OPTIME
TOPICAL | Status: DC | PRN
  Administered 2024-05-22 (×3): 1000 mL

## 2024-05-22 MED ORDER — PROPOFOL 500 MG/50ML IV EMUL
INTRAVENOUS | Status: DC | PRN
Start: 2024-05-22 — End: 2024-05-22
  Administered 2024-05-22: 100 ug/kg/min via INTRAVENOUS

## 2024-05-22 MED ORDER — METHOCARBAMOL 1000 MG/10ML IJ SOLN
500.0000 mg | Freq: Four times a day (QID) | INTRAMUSCULAR | Status: DC | PRN
  Administered 2024-05-26: 500 mg via INTRAVENOUS
  Filled 2024-05-22: qty 10

## 2024-05-22 MED ORDER — CELECOXIB 200 MG PO CAPS
200.0000 mg | ORAL_CAPSULE | Freq: Once | ORAL | Status: AC
  Administered 2024-05-22: 200 mg via ORAL
  Filled 2024-05-22: qty 1

## 2024-05-22 MED ORDER — ALBUTEROL SULFATE HFA 108 (90 BASE) MCG/ACT IN AERS: 2.0000 | INHALATION_SPRAY | RESPIRATORY_TRACT | Status: DC | PRN

## 2024-05-22 MED ORDER — ACETAMINOPHEN 10 MG/ML IV SOLN
1000.0000 mg | Freq: Once | INTRAVENOUS | Status: AC
Start: 2024-05-22 — End: 2024-05-22
  Administered 2024-05-22: 1000 mg via INTRAVENOUS

## 2024-05-22 MED ORDER — PHENYLEPHRINE HCL-NACL 20-0.9 MG/250ML-% IV SOLN: INTRAVENOUS | Status: DC | PRN

## 2024-05-22 MED ORDER — ORAL CARE MOUTH RINSE: 15.0000 mL | Freq: Once | OROMUCOSAL | Status: AC

## 2024-05-22 MED ORDER — DOCUSATE SODIUM 100 MG PO CAPS
100.0000 mg | ORAL_CAPSULE | Freq: Two times a day (BID) | ORAL | Status: DC
  Administered 2024-05-22 – 2024-05-29 (×13): 100 mg via ORAL
  Filled 2024-05-22 (×16): qty 1

## 2024-05-22 MED ORDER — CEFAZOLIN SODIUM-DEXTROSE 2-4 GM/100ML-% IV SOLN
2.0000 g | Freq: Four times a day (QID) | INTRAVENOUS | Status: AC
  Administered 2024-05-22 – 2024-05-23 (×2): 2 g via INTRAVENOUS
  Filled 2024-05-22 (×2): qty 100

## 2024-05-22 MED ORDER — ROCURONIUM BROMIDE 10 MG/ML (PF) SYRINGE
PREFILLED_SYRINGE | INTRAVENOUS | Status: DC | PRN
  Administered 2024-05-22: 50 mg via INTRAVENOUS

## 2024-05-22 MED ORDER — BUDESONIDE 0.25 MG/2ML IN SUSP
0.2500 mg | Freq: Two times a day (BID) | RESPIRATORY_TRACT | Status: DC
  Administered 2024-05-23 – 2024-05-31 (×15): 0.25 mg via RESPIRATORY_TRACT
  Filled 2024-05-22 (×18): qty 2

## 2024-05-22 MED ORDER — VITAMIN B-12 1000 MCG PO TABS
1000.0000 ug | ORAL_TABLET | Freq: Every day | ORAL | Status: DC
  Administered 2024-05-23 – 2024-05-31 (×8): 1000 ug via ORAL
  Filled 2024-05-22 (×9): qty 1

## 2024-05-22 MED ORDER — PANTOPRAZOLE SODIUM 40 MG PO TBEC
40.0000 mg | DELAYED_RELEASE_TABLET | Freq: Every day | ORAL | Status: DC
Start: 2024-05-22 — End: 2024-05-31
  Administered 2024-05-22 – 2024-05-31 (×9): 40 mg via ORAL
  Filled 2024-05-22 (×10): qty 1

## 2024-05-22 MED ORDER — THROMBIN 5000 UNITS EX KIT
PACK | CUTANEOUS | Status: AC
  Filled 2024-05-22: qty 1

## 2024-05-22 MED ORDER — LACTATED RINGERS IV SOLN: INTRAVENOUS | Status: DC

## 2024-05-22 MED ORDER — AMISULPRIDE (ANTIEMETIC) 5 MG/2ML IV SOLN: 10.0000 mg | Freq: Once | INTRAVENOUS | Status: DC | PRN

## 2024-05-22 MED ORDER — CHLORHEXIDINE GLUCONATE 0.12 % MT SOLN
15.0000 mL | Freq: Once | OROMUCOSAL | Status: AC
  Administered 2024-05-22: 15 mL via OROMUCOSAL
  Filled 2024-05-22: qty 15

## 2024-05-22 MED ORDER — DEXAMETHASONE SODIUM PHOSPHATE 10 MG/ML IJ SOLN
INTRAMUSCULAR | Status: DC | PRN
  Administered 2024-05-22: 10 mg via INTRAVENOUS

## 2024-05-22 MED ORDER — SUGAMMADEX SODIUM 200 MG/2ML IV SOLN
INTRAVENOUS | Status: DC | PRN
  Administered 2024-05-22: 200 mg via INTRAVENOUS

## 2024-05-22 MED ORDER — BUPIVACAINE LIPOSOME 1.3 % IJ SUSP
INTRAMUSCULAR | Status: AC
  Filled 2024-05-22: qty 20

## 2024-05-22 MED ORDER — OXYCODONE HCL 5 MG PO TABS
10.0000 mg | ORAL_TABLET | ORAL | Status: DC | PRN
  Administered 2024-05-23 – 2024-05-26 (×18): 10 mg via ORAL
  Filled 2024-05-22 (×18): qty 2

## 2024-05-22 MED ORDER — FENTANYL CITRATE (PF) 250 MCG/5ML IJ SOLN
INTRAMUSCULAR | Status: DC | PRN
  Administered 2024-05-22: 50 ug via INTRAVENOUS
  Administered 2024-05-22: 100 ug via INTRAVENOUS
  Administered 2024-05-22 (×4): 50 ug via INTRAVENOUS

## 2024-05-22 MED ORDER — ALBUTEROL-BUDESONIDE 90-80 MCG/ACT IN AERO: 2.0000 | INHALATION_SPRAY | Freq: Every day | RESPIRATORY_TRACT | Status: DC

## 2024-05-22 MED ORDER — FLEET ENEMA RE ENEM: 1.0000 | ENEMA | Freq: Once | RECTAL | Status: DC | PRN

## 2024-05-22 MED ORDER — ALBUTEROL SULFATE (2.5 MG/3ML) 0.083% IN NEBU: 2.5000 mg | INHALATION_SOLUTION | RESPIRATORY_TRACT | Status: DC | PRN

## 2024-05-22 MED ORDER — HYDROMORPHONE HCL 1 MG/ML IJ SOLN
0.2500 mg | INTRAMUSCULAR | Status: DC | PRN
  Administered 2024-05-22: 0.25 mg via INTRAVENOUS

## 2024-05-22 MED ORDER — BUPIVACAINE HCL (PF) 0.5 % IJ SOLN
INTRAMUSCULAR | Status: DC | PRN
  Administered 2024-05-22: 5 mL

## 2024-05-22 MED ORDER — HYDROMORPHONE HCL 1 MG/ML IJ SOLN
INTRAMUSCULAR | Status: AC
  Filled 2024-05-22: qty 0.5

## 2024-05-22 MED ORDER — ONDANSETRON HCL 4 MG PO TABS: 4.0000 mg | ORAL_TABLET | Freq: Four times a day (QID) | ORAL | Status: DC | PRN

## 2024-05-22 MED ORDER — CHLORHEXIDINE GLUCONATE CLOTH 2 % EX PADS
6.0000 | MEDICATED_PAD | Freq: Once | CUTANEOUS | Status: DC
Start: 2024-05-22 — End: 2024-05-22

## 2024-05-22 MED ORDER — BUPIVACAINE HCL (PF) 0.5 % IJ SOLN
INTRAMUSCULAR | Status: AC
  Filled 2024-05-22: qty 30

## 2024-05-22 MED ORDER — HYDROMORPHONE HCL 1 MG/ML IJ SOLN
0.5000 mg | INTRAMUSCULAR | Status: DC | PRN
  Administered 2024-05-22 – 2024-05-26 (×9): 0.5 mg via INTRAVENOUS
  Filled 2024-05-22 (×10): qty 0.5

## 2024-05-22 MED ORDER — LIDOCAINE-EPINEPHRINE 1 %-1:100000 IJ SOLN
INTRAMUSCULAR | Status: DC | PRN
  Administered 2024-05-22: 5 mL

## 2024-05-22 MED ORDER — POLYETHYLENE GLYCOL 3350 17 G PO PACK
17.0000 g | PACK | Freq: Every day | ORAL | Status: DC | PRN
  Administered 2024-05-28: 17 g via ORAL
  Filled 2024-05-22: qty 1

## 2024-05-22 MED ORDER — LIDOCAINE-EPINEPHRINE 1 %-1:100000 IJ SOLN
INTRAMUSCULAR | Status: AC
  Filled 2024-05-22: qty 1

## 2024-05-22 MED ORDER — KETOROLAC TROMETHAMINE 15 MG/ML IJ SOLN
7.5000 mg | Freq: Four times a day (QID) | INTRAMUSCULAR | Status: AC
  Administered 2024-05-22 – 2024-05-23 (×4): 7.5 mg via INTRAVENOUS
  Filled 2024-05-22 (×4): qty 1

## 2024-05-22 MED ORDER — ROSUVASTATIN CALCIUM 5 MG PO TABS
5.0000 mg | ORAL_TABLET | Freq: Every day | ORAL | Status: DC
Start: 2024-05-22 — End: 2024-05-31
  Administered 2024-05-22 – 2024-05-31 (×9): 5 mg via ORAL
  Filled 2024-05-22 (×9): qty 1

## 2024-05-22 MED ORDER — LEVOTHYROXINE SODIUM 25 MCG PO TABS
25.0000 ug | ORAL_TABLET | Freq: Every day | ORAL | Status: DC
  Administered 2024-05-23 – 2024-05-31 (×9): 25 ug via ORAL
  Filled 2024-05-22 (×9): qty 1

## 2024-05-22 MED ORDER — ACETAMINOPHEN 500 MG PO TABS
1000.0000 mg | ORAL_TABLET | Freq: Once | ORAL | Status: AC
Start: 2024-05-22 — End: 2024-05-22
  Administered 2024-05-22: 1000 mg via ORAL
  Filled 2024-05-22: qty 2

## 2024-05-22 MED ORDER — ACETAMINOPHEN 325 MG PO TABS
650.0000 mg | ORAL_TABLET | ORAL | Status: DC | PRN
  Administered 2024-05-28 – 2024-05-30 (×3): 650 mg via ORAL
  Filled 2024-05-22 (×3): qty 2

## 2024-05-22 SURGICAL SUPPLY — 99 items
ANCHOR LUMBAR 25 MIS (Anchor) IMPLANT
BAG COUNTER SPONGE SURGICOUNT (BAG) ×6 IMPLANT
BASKET BONE COLLECTION (BASKET) IMPLANT
BENZOIN TINCTURE PRP APPL 2/3 (GAUZE/BANDAGES/DRESSINGS) IMPLANT
BLADE CLIPPER SURG (BLADE) IMPLANT
BONE MATRIX OSTEOCEL PRO LRG (Bone Implant) IMPLANT
BUR CARBIDE MATCH 3.0 (BURR) IMPLANT
BUR PRECISION FLUTE 5.0 (BURR) IMPLANT
BUR SURG IBUR 4X12.5 (BURR) ×2 IMPLANT
CANISTER SUCTION 3000ML PPV (SUCTIONS) ×2 IMPLANT
CLIP APPLIE 11 MED OPEN (CLIP) ×4 IMPLANT
CLIP LIGATING EXTRA MED SLVR (CLIP) IMPLANT
COVER BACK TABLE 60X90IN (DRAPES) ×2 IMPLANT
DERMABOND ADVANCED .7 DNX12 (GAUZE/BANDAGES/DRESSINGS) ×4 IMPLANT
DISSECTOR BLUNT TIP ENDO 5MM (MISCELLANEOUS) IMPLANT
DRAPE C-ARM 42X72 X-RAY (DRAPES) ×4 IMPLANT
DRAPE C-ARMOR (DRAPES) ×4 IMPLANT
DRAPE LAPAROTOMY 100X72X124 (DRAPES) ×4 IMPLANT
DRAPE SURG ORHT 6 SPLT 77X108 (DRAPES) IMPLANT
DRAPE UTILITY XL STRL (DRAPES) ×2 IMPLANT
DRSG OPSITE POSTOP 4X6 (GAUZE/BANDAGES/DRESSINGS) IMPLANT
DRSG OPSITE POSTOP 4X8 (GAUZE/BANDAGES/DRESSINGS) IMPLANT
DURAPREP 26ML APPLICATOR (WOUND CARE) ×4 IMPLANT
ELECT COATED BLADE 2.86 ST (ELECTRODE) ×2 IMPLANT
ELECT NVM5 SURFACE MEP/EMG (ELECTRODE) IMPLANT
ELECTRODE BLDE 4.0 EZ CLN MEGD (MISCELLANEOUS) ×2 IMPLANT
ELECTRODE BLDE INSULATED 6.5IN (ELECTROSURGICAL) ×2 IMPLANT
ELECTRODE REM PT RTRN 9FT ADLT (ELECTROSURGICAL) ×4 IMPLANT
GAUZE 4X4 16PLY ~~LOC~~+RFID DBL (SPONGE) IMPLANT
GAUZE SPONGE 4X4 12PLY STRL (GAUZE/BANDAGES/DRESSINGS) IMPLANT
GLOVE BIO SURGEON STRL SZ7 (GLOVE) ×8 IMPLANT
GLOVE BIO SURGEON STRL SZ7.5 (GLOVE) ×2 IMPLANT
GLOVE BIOGEL PI IND STRL 7.5 (GLOVE) ×4 IMPLANT
GLOVE BIOGEL PI IND STRL 8 (GLOVE) ×10 IMPLANT
GLOVE ECLIPSE 8.0 STRL XLNG CF (GLOVE) ×4 IMPLANT
GOWN STRL REUS W/ TWL LRG LVL3 (GOWN DISPOSABLE) IMPLANT
GOWN STRL REUS W/ TWL XL LVL3 (GOWN DISPOSABLE) ×12 IMPLANT
GOWN STRL REUS W/TWL 2XL LVL3 (GOWN DISPOSABLE) ×4 IMPLANT
HEMOSTAT POWDER KIT SURGIFOAM (HEMOSTASIS) ×4 IMPLANT
INSERT FOGARTY 61MM (MISCELLANEOUS) IMPLANT
INSERT FOGARTY SM (MISCELLANEOUS) IMPLANT
KIT BASIN OR (CUSTOM PROCEDURE TRAY) ×4 IMPLANT
KIT DILATOR XLIF 5 (KITS) IMPLANT
KIT INFUSE SMALL (Orthopedic Implant) IMPLANT
KIT INFUSE X SMALL 1.4CC (Orthopedic Implant) IMPLANT
KIT SURGICAL ACCESS MAXCESS 4 (KITS) IMPLANT
KIT TURNOVER KIT B (KITS) ×4 IMPLANT
MODULE EMG NDL SSEP NVM5 (NEUROSURGERY SUPPLIES) IMPLANT
MODULE EMG NEEDLE SSEP NVM5 (NEUROSURGERY SUPPLIES) ×2 IMPLANT
NDL HYPO 21X1.5 SAFETY (NEEDLE) ×2 IMPLANT
NDL HYPO 22X1.5 SAFETY MO (MISCELLANEOUS) ×4 IMPLANT
NDL SPNL 18GX3.5 QUINCKE PK (NEEDLE) ×2 IMPLANT
NEEDLE HYPO 21X1.5 SAFETY (NEEDLE) ×2 IMPLANT
NEEDLE HYPO 22X1.5 SAFETY MO (MISCELLANEOUS) ×4 IMPLANT
NEEDLE SPNL 18GX3.5 QUINCKE PK (NEEDLE) ×2 IMPLANT
NS IRRIG 1000ML POUR BTL (IV SOLUTION) ×4 IMPLANT
PACK LAMINECTOMY NEURO (CUSTOM PROCEDURE TRAY) ×4 IMPLANT
PAD ARMBOARD POSITIONER FOAM (MISCELLANEOUS) ×4 IMPLANT
PLATE ADIRA 2H 17 RLX (Plate) IMPLANT
PROBE BALL TIP NVM5 SNG USE (NEUROSURGERY SUPPLIES) IMPLANT
PUTTY DBM INSTAFILL CART 5CC (Putty) IMPLANT
ROD SPINAL THRD DISP (MISCELLANEOUS) IMPLANT
SCREW ALIGN ADIRA M4.5 GRN (Screw) IMPLANT
SCREW SELF DRILL 5.5X45 (Screw) IMPLANT
SPACER HEDRON IA 26X34X15 8D (Spacer) IMPLANT
SPACER RISE-L 18X50 7-14 (Spacer) IMPLANT
SPACER RISE-L 18X50 8-15 6D (Spacer) IMPLANT
SPACER RISEL 22X50 8-15MM 6D (Spacer) IMPLANT
SPIKE FLUID TRANSFER (MISCELLANEOUS) ×4 IMPLANT
SPONGE INTESTINAL PEANUT (DISPOSABLE) ×4 IMPLANT
SPONGE NEURO XRAY DETECT 1X3 (DISPOSABLE) IMPLANT
SPONGE SURGIFOAM ABS GEL SZ50 (HEMOSTASIS) IMPLANT
SPONGE T-LAP 18X18 ~~LOC~~+RFID (SPONGE) ×4 IMPLANT
SPONGE T-LAP 4X18 ~~LOC~~+RFID (SPONGE) IMPLANT
SPONGE TONSIL 1 RF SGL (DISPOSABLE) IMPLANT
STAPLER SKIN PROX 35W (STAPLE) ×2 IMPLANT
STRIP CLOSURE SKIN 1/2X4 (GAUZE/BANDAGES/DRESSINGS) IMPLANT
SUT MNCRL AB 4-0 PS2 18 (SUTURE) ×2 IMPLANT
SUT PDS AB 1 CTX 36 (SUTURE) ×2 IMPLANT
SUT PROLENE 4-0 RB1 .5 CRCL 36 (SUTURE) IMPLANT
SUT PROLENE 5 0 CC1 (SUTURE) IMPLANT
SUT PROLENE 6 0 C 1 30 (SUTURE) IMPLANT
SUT PROLENE 6 0 CC (SUTURE) IMPLANT
SUT SILK 0 TIES 10X30 (SUTURE) IMPLANT
SUT SILK 2 0 TIES 10X30 (SUTURE) ×2 IMPLANT
SUT SILK 2 0SH CR/8 30 (SUTURE) IMPLANT
SUT SILK 2-0 18XBRD TIE BLK (SUTURE) ×2 IMPLANT
SUT SILK 3 0SH CR/8 30 (SUTURE) IMPLANT
SUT SILK 3-0 18XBRD TIE BLK (SUTURE) IMPLANT
SUT VIC AB 0 CT1 18XCR BRD8 (SUTURE) ×4 IMPLANT
SUT VIC AB 2-0 CP2 18 (SUTURE) ×4 IMPLANT
SUT VIC AB 4-0 PS2 18 (SUTURE) ×2 IMPLANT
SYR 20ML LL LF (SYRINGE) ×2 IMPLANT
TIP CONICAL INSTAFILL (ORTHOPEDIC DISPOSABLE SUPPLIES) IMPLANT
TOWEL GREEN STERILE (TOWEL DISPOSABLE) ×6 IMPLANT
TOWEL GREEN STERILE FF (TOWEL DISPOSABLE) ×4 IMPLANT
TRAY FOLEY MTR SLVR 14FR STAT (SET/KITS/TRAYS/PACK) IMPLANT
TRAY FOLEY MTR SLVR 16FR STAT (SET/KITS/TRAYS/PACK) ×4 IMPLANT
WATER STERILE IRR 1000ML POUR (IV SOLUTION) ×4 IMPLANT

## 2024-05-22 NOTE — Anesthesia Procedure Notes (Signed)
 Procedure Name: Intubation Date/Time: 05/22/2024 7:58 AM  Performed by: Scherrie Mast, CRNAPre-anesthesia Checklist: Patient identified, Emergency Drugs available, Suction available and Patient being monitored Patient Re-evaluated:Patient Re-evaluated prior to induction Oxygen Delivery Method: Circle System Utilized Preoxygenation: Pre-oxygenation with 100% oxygen Induction Type: IV induction Ventilation: Mask ventilation without difficulty Laryngoscope Size: Glidescope and 3 Grade View: Grade I Tube type: Oral Tube size: 7.0 mm Number of attempts: 1 Airway Equipment and Method: Stylet and Oral airway Placement Confirmation: ETT inserted through vocal cords under direct vision, positive ETCO2 and breath sounds checked- equal and bilateral Secured at: 21 cm Tube secured with: Tape Dental Injury: Teeth and Oropharynx as per pre-operative assessment

## 2024-05-22 NOTE — Op Note (Signed)
 Date: May 22, 2024  Preoperative diagnosis: Chronic back pain  Postoperative diagnosis: Same  Procedure: Anterior retroperitoneal spine exposure for L5-S1 ALIF  Surgeon: Dr. Lonni DOROTHA Gaskins, MD  Co-surgeon: Dr. Dorn Ned, MD  Indications: 68 year old female with chronic lower back pain that presents for anterior L5-S1 ALIF.  Vascular surgery was asked to assist with anterior spine exposure.  The patient presents after risk benefits discussed.  Findings: Transverse incision over the left rectus muscle at the L5-S1 disc space.  Opened the anterior rectus sheath mobilized left rectus to the midline.  I then entered lateral to the muscle into the retroperitoneum and mobilized peritoneum left ureter across midline.  Middle sacral vessels were divided between clips.  Mobilized on both sides of the disc space.  Fixed NuVasive retractors were placed.  Confirmed we were at the correct L5-S1 level with a needle in the disc space.  Anesthesia: General  Details: Patient was taken to the operating room after informed consent was obtained.  Placed on the operative table supine position.  General endotracheal anesthesia was induced.  Fluoroscopic C-arm was then used in the lateral position to mark the L5-S1 disc space.  The abdominal was then prepped and draped in standard sterile fashion.  Antibiotics were given and timeout performed.  Went ahead made a transverse incision at our preoperative mark.  Dissected through subcutaneous tissue and used cerebellar retractors.  The anterior rectus sheath was opened transversely.  I raised flaps underneath the anterior rectus sheath.  The left rectus was mobilized to the midline.  I mobilized lateral to muscle with blunt dissection and bluntly mobilized peritoneum and left ureter to the midline and Dr. Ned used hand-held Wiley retractors to pull the peritoneum and left ureter to the midline.  Ultimately visualized the middle sacral vessels that were  divided between clips.  I then bluntly mobilized on both sides of the disc space with blunt dissection and Kd.  Ficks NuVasive retractors were placed.  140 reverse lip retractors were placed on the side of the disc space.  I placed a 120 reverse lip cranial and 140 reverse lip caudal.  We had good anterior exposure.  A needle was placed in the disc space.  Confirmed at the correct level at L5-S1.  Case was turned over to Dr. Ned.  Complication: None  Condition: Stable  Lonni DOROTHA Gaskins, MD [facility] Office: [phone]   Lonni JINNY Gaskins

## 2024-05-22 NOTE — Transfer of Care (Signed)
 Immediate Anesthesia Transfer of Care Note  Patient: Kathryn [name]  Procedure(s) Performed: ANTERIOR LUMBAR FUSION LUMBAR FIVE- SACRAL ONE RIGHT DIRECT ANTERIOR LATERAL LUMBAR FUSION LUMBAR ONE-LUMBAR TWO, LUMBAR TWO-LUMBAR THREE, LUMBAR THREE-LUMBAR FOUR, LUMBAR TWO-LUMBAR THREE ANTEROLATERAL PLATE (Right) ABDOMINAL EXPOSURE  Patient Location: PACU  Anesthesia Type:General  Level of Consciousness: awake, alert , and oriented  Airway & Oxygen Therapy: Patient Spontanous Breathing and Patient connected to nasal cannula oxygen  Post-op Assessment: Report given to RN and Post -op Vital signs reviewed and stable  Post vital signs: Reviewed and stable  Last Vitals:  Vitals Value Taken Time  BP 121/63 05/22/24 14:45  Temp 37.2 C 05/22/24 14:45  Pulse 76 05/22/24 14:48  Resp 23 05/22/24 14:48  SpO2 97 % 05/22/24 14:48  Vitals shown include unfiled device data.  Last Pain:  Vitals:   05/22/24 0605  TempSrc: Oral         Complications: No notable events documented.

## 2024-05-22 NOTE — Progress Notes (Signed)
 Orthopedic Tech Progress Note Patient Details:  Kathryn [name] Nov 26, 1955 [id number]  Ortho Devices Type of Ortho Device: Lumbar corsett Ortho Device/Splint Location: BACK Ortho Device/Splint Interventions: Ordered   Post Interventions Patient Tolerated: Well Instructions Provided: Care of device  [name] Pac 05/22/2024, 6:38 PM

## 2024-05-22 NOTE — Plan of Care (Signed)

## 2024-05-22 NOTE — Anesthesia Procedure Notes (Signed)
 Arterial Line Insertion Start/End9/15/2025 8:10 AM, 05/22/2024 8:15 AM Performed by: Dorethea Cordella SQUIBB, DO, Scherrie Mast, CRNA, CRNA  Patient location: OOR procedure area. Preanesthetic checklist: patient identified, IV checked, site marked, risks and benefits discussed, surgical consent, monitors and equipment checked, pre-op evaluation, timeout performed and anesthesia consent Lidocaine  1% used for infiltration Left, radial was placed Catheter size: 20 G Hand hygiene performed  and maximum sterile barriers used   Attempts: 1 Procedure performed without using ultrasound guided technique. Following insertion, dressing applied. Post procedure assessment: normal and unchanged

## 2024-05-22 NOTE — H&P (Signed)
 CC: back pain, leg pain  HPI:     Patient is a 68 y.o. female presents with bilateral leg pain and back pain, found to have degenerative scoliosis and stenosis.  She is here for elective surgery.    Patient Active Problem List   Diagnosis Date Noted   Chronic back pain 05/09/2024   Elevated LFTs 08/24/2022   Depression 06/18/2022   TIA (transient ischemic attack) 06/17/2022   Chronic pain 06/17/2022   Hyperglycemia 06/17/2022   Anxiety 06/17/2022   Asthma, chronic 06/17/2022   Hypothyroidism 06/17/2022   Restless leg syndrome 06/17/2022   Nausea without vomiting 02/10/2022   Diarrhea 04/09/2021   Pain in right knee 11/23/2019   IBS (irritable bowel syndrome) 06/27/2019   Dysphagia 05/08/2019   Paresthesias 04/27/2019   Hiatal hernia with GERD without esophagitis 11/27/2016   Gastroesophageal reflux disease    Hiatal hernia    S/P shoulder replacement 02/21/2016   DYSPNEA 12/13/2009   CHEST PAIN, ATYPICAL 12/13/2009   Past Medical History:  Diagnosis Date   Adrenal insufficiency (HCC)    occured following steroid shoulder injections ~ spring 2017   Anxiety    pt. reports that she has RLS- takes Xanax  for calming her self so she can settle & sleep    Arthritis    shoulder & back, knees & leg     Asthma    aleery related   Atypical mole 11/12/2020   mod-severe- mid back   Chest pain, atypical 2020   had cardiac workup - deemed related to anxiety   Chronic bronchitis (HCC)    less since I quit smoking (02/21/2016)   Chronic lower back pain    Dyspnea    episodic with allergen triggers   GERD (gastroesophageal reflux disease)    Guillain-Barre syndrome following vaccination (HCC) 1970's   post swine flu shot, hosp. for treatment for 1 month    H/O dizziness    H/O hiatal hernia    Hepatitis    hepatitis as a child; got it from a little girl at school make sick and vomiting   Herpes simplex    History of Guillain-Barre syndrome ~ 1978   from the swine flu  shot   Hypothyroidism    IBS (irritable bowel syndrome)    Pneumonia    PONV (postoperative nausea and vomiting)    Restless leg syndrome    TIA (transient ischemic attack)    Walking pneumonia ~ 2011    Past Surgical History:  Procedure Laterality Date   23 HOUR PH STUDY N/A 09/23/2016   Procedure: 24 HOUR PH STUDY;  Surgeon: Gustav Shila GAILS, MD;  Location: [facility];  Service: Endoscopy;  Laterality: N/A;   ANTERIOR CERVICAL DECOMP/DISCECTOMY FUSION  2000 & 2015   two times ago- C5 and C6, Dr. Lynwood JONELLE Mill, MD 2015   APPENDECTOMY  1980s   BACK SURGERY     BIOPSY  03/24/2022   Procedure: BIOPSY;  Surgeon: Eartha Angelia Sieving, MD;  Location: [facility];  Service: Gastroenterology;;   BREAST BIOPSY Right    benign   CHOLECYSTECTOMY OPEN  1980s   COLONOSCOPY N/A 09/20/2014   rehman: Prep was excellent except she had thick layer of stool coating cecal and ascending colon mucosa. Cecal landmarks were well identified after vigorous washing. Small lymphoid polyp ablated via cold biopsy from appendiceal stump.Mucosa of rest of the colon was normal. Normal mucosa of rectum and anorectal junction.   ESOPHAGEAL DILATION N/A 05/29/2019   Procedure:  ESOPHAGEAL DILATION;  Surgeon: Golda Claudis PENNER, MD;  Location: [facility];  Service: Endoscopy;  Laterality: N/A;   ESOPHAGEAL MANOMETRY N/A 09/23/2016   Procedure: ESOPHAGEAL MANOMETRY (EM);  Surgeon: Gustav Shila GAILS, MD;  Location: [facility];  Service: Endoscopy;  Laterality: N/A;   ESOPHAGOGASTRODUODENOSCOPY N/A 09/20/2014   Procedure: ESOPHAGOGASTRODUODENOSCOPY (EGD);  Surgeon: Claudis PENNER Golda, MD;  Location: [facility];  Service: Endoscopy;  Laterality: N/A;   ESOPHAGOGASTRODUODENOSCOPY (EGD) WITH PROPOFOL  N/A 05/29/2019   - Z-line, 38 cm from the incisors.   ESOPHAGOGASTRODUODENOSCOPY (EGD) WITH PROPOFOL  N/A 03/24/2022   Procedure: ESOPHAGOGASTRODUODENOSCOPY (EGD) WITH PROPOFOL ;  Surgeon: Eartha Angelia Sieving, MD;  [facility]: [facility];  Service: Gastroenterology;  Laterality: N/A;  730 ASA 1   HIATAL HERNIA REPAIR N/A 11/27/2016   Procedure: LAPAROSCOPIC REPAIR OF HIATAL HERNIA WITH NISSEN FUNDOPLICATION;  Surgeon: Krystal Russell, MD;  Location: [facility];  Service: General;  Laterality: N/A;   INCISION AND DRAINAGE OF WOUND Left 02/21/2016   forearm, minor   IR ANGIO INTRA EXTRACRAN SEL INTERNAL CAROTID BILAT MOD SED  11/03/2022   IR ANGIO VERTEBRAL SEL VERTEBRAL UNI L MOD SED  11/03/2022   LUMBAR LAMINECTOMY/DECOMPRESSION MICRODISCECTOMY Right 06/22/2013   Procedure: RIGHT LUMBAR TWO-THREE DISCECTOMY;  Surgeon: Lynwood JONELLE Mill, MD;  Location: [facility];  Service: Neurosurgery;  Laterality: Right;  Right    MALONEY DILATION N/A 09/20/2014   Procedure: AGAPITO DILATION;  Surgeon: Claudis PENNER Golda, MD;  Location: [facility];  Service: Endoscopy;  Laterality: N/A;   MINOR IRRIGATION AND DEBRIDEMENT OF WOUND Left 02/21/2016   Procedure: MINOR IRRIGATION AND DEBRIDEMENT/REPAIR OF LEFT ARM WOUND;  Surgeon: Marcey Her, MD;  Location: [facility];  Service: Orthopedics;  Laterality: Left;   REVERSE SHOULDER ARTHROPLASTY Right 02/21/2016   Procedure: RIGHT REVERSE TOTAL SHOULDER ARTHROPLASTY;  Surgeon: Marcey Her, MD;  Location: [facility];  Service: Orthopedics;  Laterality: Right;   REVERSE TOTAL SHOULDER ARTHROPLASTY Right 02/21/2016   SAVORY DILATION  03/24/2022   Procedure: SAVORY DILATION;  Surgeon: Eartha Angelia, Sieving, MD;  Location: [facility];  Service: Gastroenterology;;   SHOULDER ARTHROSCOPY W/ ROTATOR CUFF REPAIR Right 1990s   TOTAL SHOULDER REPLACEMENT     Right   VAGINAL HYSTERECTOMY      Medications Prior to Admission  Medication Sig Dispense Refill Last Dose/Taking   acetaminophen  (TYLENOL ) 500 MG tablet Take 500-1,000 mg by mouth every 6 (six) hours as needed (For pain.).   Past Month   AIRSUPRA  90-80 MCG/ACT AERO Inhale 2 puffs into the lungs daily.   05/22/2024 at  5:00  AM   aspirin  EC 81 MG tablet Take 81 mg by mouth daily. Swallow whole.   Taking   Biotin  10000 MCG TABS Take 10,000 mcg by mouth daily.    Past Week   Cholecalciferol  (VITAMIN D -3) 125 MCG (5000 UT) TABS Take 5,000 Units by mouth daily.    Past Week   cyanocobalamin  (VITAMIN B12) 1000 MCG tablet Take 1 tablet (1,000 mcg total) by mouth daily. 30 tablet 2 Past Week   EPINEPHrine  0.3 mg/0.3 mL IJ SOAJ injection Inject 0.3 mg into the skin as needed for anaphylaxis.   Taking As Needed   escitalopram  (LEXAPRO ) 20 MG tablet Take 20 mg by mouth daily.   05/22/2024 at  5:00 AM   esomeprazole  (NEXIUM ) 40 MG capsule Take 1 capsule (40 mg total) by mouth daily. TAKE 1 CAPSULE(40 MG) BY MOUTH TWICE DAILY BEFORE A MEAL 90 capsule 3 05/21/2024  fluticasone  (FLONASE ) 50 MCG/ACT nasal spray Place 2 sprays into both nostrils daily as needed for allergies or rhinitis.   05/21/2024   GABAPENTIN  PO Take 100 mg by mouth at bedtime. 100mg  at night   05/21/2024   ibuprofen  (ADVIL ) 800 MG tablet Take 1 tablet (800 mg total) by mouth every 8 (eight) hours as needed. (Patient taking differently: Take 200 mg by mouth every 8 (eight) hours as needed for mild pain (pain score 1-3) or moderate pain (pain score 4-6).) 90 tablet 0 Past Week   levocetirizine (XYZAL) 5 MG tablet Take 5 mg by mouth daily.   05/21/2024   levothyroxine  (SYNTHROID ) 25 MCG tablet Take 25 mcg by mouth daily before breakfast.   05/22/2024 at  5:00 AM   loratadine  (CLARITIN ) 10 MG tablet Take 10 mg by mouth daily.   05/21/2024   ondansetron  (ZOFRAN ) 4 MG tablet Take 1 tablet (4 mg total) by mouth every 8 (eight) hours as needed for nausea or vomiting. 30 tablet 1 Taking As Needed   rOPINIRole  (REQUIP ) 4 MG tablet Take 6 mg by mouth daily. 4 - 6 mg as needed   05/21/2024   rosuvastatin  (CRESTOR ) 5 MG tablet Take 1 tablet (5 mg total) by mouth at bedtime. 90 tablet 3 05/21/2024   valACYclovir  (VALTREX ) 1000 MG tablet Take 1,000 mg by mouth daily.   05/22/2024 at   5:00 AM   albuterol  (PROVENTIL  HFA;VENTOLIN  HFA) 108 (90 Base) MCG/ACT inhaler Inhale 2 puffs into the lungs every 4 (four) hours as needed for wheezing or shortness of breath.   More than a month   ALPRAZolam  (XANAX ) 0.5 MG tablet Take 0.5 mg by mouth daily as needed for anxiety. (Patient not taking: Reported on 05/18/2024)   05/14/2024   montelukast  (SINGULAIR ) 10 MG tablet Take 10 mg by mouth every morning.  (Patient not taking: Reported on 05/18/2024)   Not Taking   sucralfate  (CARAFATE ) 1 g tablet TAKE 1 TABLET(1 GRAM) BY MOUTH FOUR TIMES DAILY AT BEDTIME WITH MEALS (Patient not taking: No sig reported) 120 tablet 1 Not Taking   Allergies  Allergen Reactions   Niacin And Related Shortness Of Breath, Swelling and Other (See Comments)    Neck; skin hot   Codeine Swelling and Other (See Comments)    tongue   Zinc  Swelling    Social History   Tobacco Use   Smoking status: Former    Current packs/day: 0.00    Average packs/day: 3.0 packs/day for 10.0 years (30.0 ttl pk-yrs)    Types: Cigarettes    Start date: 05/08/1989    Quit date: 05/09/1999    Years since quitting: 25.0    Passive exposure: Current   Smokeless tobacco: Never   Tobacco comments:    quit smoking in ~ 2003  Substance Use Topics   Alcohol  use: No    Comment: 02/21/2016 I used to drink a little bit; probably quit in my 24s    Family History  Problem Relation Age of Onset   Congestive Heart Failure Mother    Atrial fibrillation Mother    Hiatal hernia Mother    Cancer Father        lung   Heart attack Brother    Stroke Paternal Grandfather    Diabetes Paternal Grandfather    Alzheimer's disease Paternal Grandmother      Review of Systems A comprehensive review of systems was negative.  Objective:   Patient Vitals for the past 8 hrs:  BP Temp Temp  src Pulse Resp SpO2 Height Weight  05/22/24 0605 124/65 97.6 F (36.4 C) Oral 62 17 97 % 5' 7 (1.702 m) 72.6 kg   No intake/output data recorded. No  intake/output data recorded.      General : Alert, cooperative, no distress, appears stated age   Head:  Normocephalic/atraumatic    Eyes: PERRL, conjunctiva/corneas clear, EOM's intact. Fundi could not be visualized Neck: Supple Chest:  Respirations unlabored Chest wall: no tenderness or deformity Heart: Regular rate and rhythm Abdomen: Soft, nontender and nondistended Extremities: warm and well-perfused Skin: normal turgor, color and texture Neurologic:  Alert, oriented x 3.  Eyes open spontaneously. PERRL, EOMI, VFC, no facial droop. V1-3 intact.  No dysarthria, tongue protrusion symmetric.  CNII-XII intact. Normal strength, sensation and reflexes throughout.  No pronator drift, full strength in legs.  + SLR bilaterally       Data ReviewCBC:  Lab Results  Component Value Date   WBC 8.0 05/18/2024   RBC 3.86 (L) 05/18/2024   BMP:  Lab Results  Component Value Date   GLUCOSE 95 05/18/2024   CO2 26 05/18/2024   BUN 9 05/18/2024   CREATININE 0.92 05/18/2024   CREATININE 0.93 04/28/2019   CALCIUM  9.1 05/18/2024   Radiology review:  See clinic note  Assessment:   Active Problems:   * No active hospital problems. *  Degenerative scoliosis and stenosis with associated mechanical back pain and bilateral radiculopaqthy  Plan:   - stage 1 L5-S1 ALIF, L1-4 DLIF today - Risks, benefits, alternatives, and expected convalescence were discussed with her.  Risks discussed included, but were not limited to bleeding, pain, infection, scar, spinal fluid leak, neurologic deficit, instability, pseudoarthrosis, damage to nearby organs, and death.  Informed consent was obtained.

## 2024-05-22 NOTE — H&P (Signed)
 History and Physical Interval Note:  05/22/2024 7:23 AM  Kathryn [name]  has presented today for surgery, with the diagnosis of IDIOPATHIC SCOLIOSIS, LUMBAR REGION.  The various methods of treatment have been discussed with the patient and family. After consideration of risks, benefits and other options for treatment, the patient has consented to  Procedure(s) with comments: ANTERIOR LUMBAR FUSION 1 LEVEL (N/A) - L5-S1 ALIF ANTERIOR LATERAL LUMBAR FUSION 3 LEVELS (Right) - RT, L12, L23, L34 DLIF ABDOMINAL EXPOSURE (N/A) as a surgical intervention.  The patient's history has been reviewed, patient examined, no change in status, stable for surgery.  I have reviewed the patient's chart and labs.  Questions were answered to the patient's satisfaction.    Anterior spine exposure  Kathryn [name]     Patient name: Kathryn [name]            MRN: [id number]        DOB: 11/14/1955        Sex: female   REASON FOR CONSULT: Abdominal exposure for L5-S1 ALIF   HPI: Kathryn [name] is a 68 y.o. female, with history of chronic lower back pain who presents for evaluation of abdominal surgery for L5-S1 ALIF.  Patient is under the care of Dr. Debby with [facility] neurosurgery.  States she has had low back pain with leg pain since 2014 at the time of her last back surgery.  Prior abdominal surgery includes appendectomy, open cholecystectomy, and hiatal hernia repair.   She did have a prior L4-L5 decompressive laminectomy with posterior interbody fusion at L4-L5 with interbody cage by Dr. Amon in 2014.       Past Medical History:  Diagnosis Date   Adrenal insufficiency (HCC)      occured following steroid shoulder injections ~ spring 2017   Anxiety      pt. reports that she has RLS- takes Xanax  for calming her self so she can settle & sleep    Arthritis      shoulder & back, knees & leg     Asthma      aleery related   Atypical mole 11/12/2020    mod-severe- mid back   Chest pain, atypical       had cardiac workup - deemed related to anxiety   Chronic bronchitis (HCC)      less since I quit smoking (02/21/2016)   Chronic lower back pain     Dyspnea      related to spray paint that is put in  beer cans at her place of employment.  Pt. also reports that she has anxiety also that leads to SOB., Pt. reports her Mother just died a week ago     GERD (gastroesophageal reflux disease)     Guillain-Barre syndrome following vaccination (HCC) 1970's    post swine flu shot, hosp. for treatment for 1 month    H/O dizziness     H/O hiatal hernia     Hepatitis      hepatitis as a child; got it from a little girl at school make sick and vomiting   Herpes simplex     History of Guillain-Barre syndrome ~ 1978    from the swine flu shot   IBS (irritable bowel syndrome)     Pneumonia     PONV (postoperative nausea and vomiting)     Restless leg syndrome     Walking pneumonia ~ 2011  Past Surgical History:  Procedure Laterality Date   26 HOUR PH STUDY N/A 09/23/2016    Procedure: 24 HOUR PH STUDY;  Surgeon: Gustav Shila GAILS, MD;  Location: [facility];  Service: Endoscopy;  Laterality: N/A;   ANTERIOR CERVICAL DECOMP/DISCECTOMY FUSION   2000 & 2015    two times ago- C5 and C6, Dr. Lynwood JONELLE Mill, MD 2015   APPENDECTOMY   1980s   BACK SURGERY       BIOPSY   03/24/2022    Procedure: BIOPSY;  Surgeon: Eartha Angelia Sieving, MD;  [facility];  Service: Gastroenterology;;   BREAST BIOPSY Right      benign   CHOLECYSTECTOMY OPEN   1980s   COLONOSCOPY N/A 09/20/2014    rehman: Prep was excellent except she had thick layer of stool coating cecal and ascending colon mucosa. Cecal landmarks were well identified after vigorous washing. Small lymphoid polyp ablated via cold biopsy from appendiceal stump.Mucosa of rest of the colon was normal. Normal mucosa of rectum and anorectal junction.   ESOPHAGEAL DILATION N/A 05/29/2019    Procedure: ESOPHAGEAL DILATION;   Surgeon: Golda Claudis PENNER, MD;  [facility];  Service: Endoscopy;  Laterality: N/A;   ESOPHAGEAL MANOMETRY N/A 09/23/2016    Procedure: ESOPHAGEAL MANOMETRY (EM);  Surgeon: Gustav Shila GAILS, MD;  Location: [facility];  Service: Endoscopy;  Laterality: N/A;   ESOPHAGOGASTRODUODENOSCOPY N/A 09/20/2014    Procedure: ESOPHAGOGASTRODUODENOSCOPY (EGD);  Surgeon: Claudis PENNER Golda, MD;  [facility];  Service: Endoscopy;  Laterality: N/A;   ESOPHAGOGASTRODUODENOSCOPY (EGD) WITH PROPOFOL  N/A 05/29/2019    - Z-line, 38 cm from the incisors.   ESOPHAGOGASTRODUODENOSCOPY (EGD) WITH PROPOFOL  N/A 03/24/2022    Procedure: ESOPHAGOGASTRODUODENOSCOPY (EGD) WITH PROPOFOL ;  Surgeon: Eartha Angelia Sieving, MD;  [facility];  Service: Gastroenterology;  Laterality: N/A;  730 ASA 1   HIATAL HERNIA REPAIR N/A 11/27/2016    Procedure: LAPAROSCOPIC REPAIR OF HIATAL HERNIA WITH NISSEN FUNDOPLICATION;  Surgeon: Krystal Russell, MD;  Location: [facility];  Service: General;  Laterality: N/A;   INCISION AND DRAINAGE OF WOUND Left 02/21/2016    forearm, minor   IR ANGIO INTRA EXTRACRAN SEL INTERNAL CAROTID BILAT MOD SED   11/03/2022   IR ANGIO VERTEBRAL SEL VERTEBRAL UNI L MOD SED   11/03/2022   LUMBAR LAMINECTOMY/DECOMPRESSION MICRODISCECTOMY Right 06/22/2013    Procedure: RIGHT LUMBAR TWO-THREE DISCECTOMY;  Surgeon: Lynwood JONELLE Mill, MD;  Location: [facility];  Service: Neurosurgery;  Laterality: Right;  Right    MALONEY DILATION N/A 09/20/2014    Procedure: AGAPITO DILATION;  Surgeon: Claudis PENNER Golda, MD;  [facility];  Service: Endoscopy;  Laterality: N/A;   MINOR IRRIGATION AND DEBRIDEMENT OF WOUND Left 02/21/2016    Procedure: MINOR IRRIGATION AND DEBRIDEMENT/REPAIR OF LEFT ARM WOUND;  Surgeon: Marcey Her, MD;  Location: [facility];  Service: Orthopedics;  Laterality: Left;   REVERSE SHOULDER ARTHROPLASTY Right 02/21/2016    Procedure: RIGHT REVERSE TOTAL SHOULDER  ARTHROPLASTY;  Surgeon: Marcey Her, MD;  Location: [facility] OR;  Service: Orthopedics;  Laterality: Right;   REVERSE TOTAL SHOULDER ARTHROPLASTY Right 02/21/2016   SAVORY DILATION   03/24/2022    Procedure: SAVORY DILATION;  Surgeon: Eartha Angelia, Sieving, MD;  [facility];  Service: Gastroenterology;;   SHOULDER ARTHROSCOPY W/ ROTATOR CUFF REPAIR Right 1990s   TOTAL SHOULDER REPLACEMENT        Right   VAGINAL HYSTERECTOMY  Family History  Problem Relation Age of Onset   Congestive Heart Failure Mother     Atrial fibrillation Mother     Hiatal hernia Mother     Cancer Father          lung   Heart attack Brother     Stroke Paternal Grandfather     Diabetes Paternal Grandfather     Alzheimer's disease Paternal Grandmother            SOCIAL HISTORY: Social History         Socioeconomic History   Marital status: Married      Spouse name: Not on file   Number of children: Not on file   Years of education: Not on file   Highest education level: Not on file  Occupational History   Occupation: Production manager  Tobacco Use   Smoking status: Former      Current packs/day: 0.00      Average packs/day: 3.0 packs/day for 10.0 years (30.0 ttl pk-yrs)      Types: Cigarettes      Start date: 05/08/1989      Quit date: 05/09/1999      Years since quitting: 25.0      Passive exposure: Current   Smokeless tobacco: Never   Tobacco comments:      quit smoking in ~ 2003  Vaping Use   Vaping status: Never Used  Substance and Sexual Activity   Alcohol  use: No      Comment: 02/21/2016 I used to drink a little bit; probably quit in my 20s   Drug use: No   Sexual activity: Yes      Birth control/protection: Surgical      Comment: hyst  Other Topics Concern   Not on file  Social History Narrative    Walks but no other activity     Anxiety    Right handed    Caffeine-64oz daily    Social Drivers of Manufacturing engineer Strain: Not on file   Food Insecurity: Not on file  Transportation Needs: Not on file  Physical Activity: Not on file  Stress: Not on file  Social Connections: Not on file  Intimate Partner Violence: Not on file      Allergies       Allergies  Allergen Reactions   Niacin And Related Shortness Of Breath, Swelling and Other (See Comments)      Neck; skin hot   Codeine Swelling and Other (See Comments)      tongue   Zinc  Swelling              Current Outpatient Medications  Medication Sig Dispense Refill   acetaminophen  (TYLENOL ) 500 MG tablet Take 500-1,000 mg by mouth every 6 (six) hours as needed (For pain.).       albuterol  (PROVENTIL  HFA;VENTOLIN  HFA) 108 (90 Base) MCG/ACT inhaler Inhale 2 puffs into the lungs every 4 (four) hours as needed for wheezing or shortness of breath.       ALPRAZolam  (XANAX ) 0.5 MG tablet Take 0.5 mg by mouth daily as needed for anxiety.       Biotin  10000 MCG TABS Take 10,000 mcg by mouth daily.        Cholecalciferol  (VITAMIN D -3) 125 MCG (5000 UT) TABS Take 5,000 Units by mouth daily.        cyanocobalamin  (VITAMIN B12) 1000 MCG tablet Take 1 tablet (1,000 mcg total) by mouth daily. 30 tablet 2   EPINEPHrine   0.3 mg/0.3 mL IJ SOAJ injection Inject 0.3 mg into the skin as needed for anaphylaxis.       escitalopram  (LEXAPRO ) 20 MG tablet Take 20 mg by mouth daily.       esomeprazole  (NEXIUM ) 40 MG capsule Take 1 capsule (40 mg total) by mouth daily. TAKE 1 CAPSULE(40 MG) BY MOUTH TWICE DAILY BEFORE A MEAL 90 capsule 3   fluticasone  (FLONASE ) 50 MCG/ACT nasal spray Place 2 sprays into both nostrils daily as needed for allergies or rhinitis.       GABAPENTIN  PO Take by mouth. 100mg  at night       ibuprofen  (ADVIL ) 800 MG tablet Take 1 tablet (800 mg total) by mouth every 8 (eight) hours as needed. 90 tablet 0   levothyroxine  (SYNTHROID ) 25 MCG tablet Take 25 mcg by mouth daily before breakfast.       loratadine  (CLARITIN ) 10 MG tablet Take 10 mg by mouth daily.        montelukast  (SINGULAIR ) 10 MG tablet Take 10 mg by mouth every morning.        ondansetron  (ZOFRAN ) 4 MG tablet Take 1 tablet (4 mg total) by mouth every 8 (eight) hours as needed for nausea or vomiting. 30 tablet 1   rOPINIRole  (REQUIP ) 4 MG tablet Take 6 mg by mouth daily. 4 - 6 mg as needed       rosuvastatin  (CRESTOR ) 5 MG tablet Take 1 tablet (5 mg total) by mouth at bedtime. 90 tablet 3   sucralfate  (CARAFATE ) 1 g tablet TAKE 1 TABLET(1 GRAM) BY MOUTH FOUR TIMES DAILY AT BEDTIME WITH MEALS 120 tablet 1   valACYclovir  (VALTREX ) 1000 MG tablet Take 1,000 mg by mouth daily.          No current facility-administered medications for this visit.        REVIEW OF SYSTEMS:  [X]  denotes positive finding, [ ]  denotes negative finding Cardiac   Comments:  Chest pain or chest pressure:      Shortness of breath upon exertion:      Short of breath when lying flat:      Irregular heart rhythm:             Vascular      Pain in calf, thigh, or hip brought on by ambulation:      Pain in feet at night that wakes you up from your sleep:       Blood clot in your veins:      Leg swelling:              Pulmonary      Oxygen at home:      Productive cough:       Wheezing:              Neurologic      Sudden weakness in arms or legs:       Sudden numbness in arms or legs:       Sudden onset of difficulty speaking or slurred speech:      Temporary loss of vision in one eye:       Problems with dizziness:              Gastrointestinal      Blood in stool:       Vomited blood:              Genitourinary      Burning when urinating:       Blood in urine:  Psychiatric      Major depression:              Hematologic      Bleeding problems:      Problems with blood clotting too easily:             Skin      Rashes or ulcers:             Constitutional      Fever or chills:          PHYSICAL EXAM: There were no vitals filed for this visit.   GENERAL: The patient is a  well-nourished female, in no acute distress. The vital signs are documented above. CARDIAC: There is a regular rate and rhythm.  VASCULAR:  Palpable femoral pulses bilaterally Palpable DP pulses bilaterally PULMONARY: No respiratory distress. ABDOMEN: Soft and non-tender. MUSCULOSKELETAL: There are no major deformities or cyanosis. NEUROLOGIC: No focal weakness or paresthesias are detected. SKIN: There are no ulcers or rashes noted. PSYCHIATRIC: The patient has a normal affect.   DATA:    MRI reviewed 06/01/23      Assessment/Plan:   68 y.o. female, with history of chronic lower back pain who presents for evaluation of abdominal expsoure for L5-S1 ALIF.  Patient is under the care of Dr. Debby with [facility] neurosurgery.    I have reviewed her MRI and discussed she will be a good candidate for anterior approach.  I discussed transverse incision over the left rectus muscle with mobilizing into the retroperitoneum and mobilizing peritoneum and left ureter across midline to get to the disc space.  Discussed exposing the disc space from the front including moving iliac artery and vein.  Discussed risk of injury to the above structures.  All questions answered.  Discussed this L5-S1 disc will be below her prior instrumented level.  Currently scheduled for 9/15 with Dr. Debby.     Kathryn DOROTHA Gaskins, MD [facility] Office: [phone]

## 2024-05-22 NOTE — Op Note (Signed)
 PREOP DIAGNOSIS: Degenerative scoliosis and spondylolisthesis, lumbar stenosis  POSTOP DIAGNOSIS: Degenerative scoliosis and spondylolisthesis, lumbar stenosis   PROCEDURE: 1.  Anterior interbody arthrodesis L5-S1 via anterior retroperitoneal approach 2.  Anterior interbody arthrodesis L1-2, L2-3, and L3-4 via right retroperitoneal transpsoas approach 3. Placement of intervertebral biomechanical device L5-S1 4.  Placement of intervertebral biomechanical devices, L1-2, L2-3, and L3-4 5. Anterior instrumentation/fixation with integrated bone anchor placement L5-S1 6.  Anterior instrumentation/fixation with lateral XLIF plate O7-6 7. Use of morselized bone allograft and osteopromotive of material   SURGEON: Dr. Dorn Ned, MD  ASSISTANT: Camie Pickle, PA.  Please note, no qualified trainees were available to assist with the procedure.  Assistance was required for the lateral retoperitoneal approach and interbody fusion.   CO-SURGEON FOR ALIF: Medford Gaskins, MD.    ANESTHESIA: General Endotracheal  EBL: 150 ml  IMPLANTS: Globus/NuVasive ALIF Hedron 15 mm 8 degree medium cage 25 mm anchors x 3  XLIF Rise-L cage L3-4: 18 x 15 mm 7 to 14 mm, 10 degree lordosis L2-3: 22 x 50 mm, 8 to 15 mm, 6 degree lordosis L1-2: 18 x 50 mm, 8 to 15 mm, 6 degree lordosis  Adira 17 mm plate, 45 mm screws x 2  10 ml DBM Small BMP  SPECIMENS: None  DRAINS: None  COMPLICATIONS: None immediate  CONDITION: Hemodynamically stable to PACU  HISTORY: This is a 68 year old woman with history of cervical fusion and L4-5 PLIF who developed worsening mechanical back pain and bilateral radiculopathy.she was found to have worsening degenerative lumbar spine disease with degenerative scoliosis, lateral listhesis, and stenosis.  Physical therapy and injection therapy and medical therapy failed to improve her symptoms.  I had a long discussion with the patient.  I discussed with her surgical options  including stage 1 L1-4 XLIF, L5-S1 ALIF, and stage 2 T10-ilium posterior fusion for correction of her degenerative scoliosis and stenosis. Risks, benefits, alternatives, and expected convalescence were discussed with the patient.  I also discussed other options of short segment fusion.  She wished for the larger, more definitive surgery.  Risks discussed included but were not limited to bleeding, pain, infection, vascular injury, damage to intraabdominal organs, pseudoarthrosis, hardware failure, adjacent segment disease, CSF leak, neurologic deficits, weakness, numbness, paralysis, coma, and death. After all questions were answered, informed consent was obtained.  PROCEDURE IN DETAIL: The patient was brought to the op room.  General anesthesia was induced and patient was intubated by the anesthesia service.  After appropriate lines and monitors were placed, patient was was in supine and C-arm x-ray was used to localize the L5-S1 disc space over the skin.  The belly was prepped and draped in sterile fashion.  A timeout was performed.  Preoperative antibiotics were administered.   Dr. Gaskins L5-S1 disc spaces via a retroperitoneal approach, and I acted as an Geophysicist/field seismologist.  Please see his notes for details.  With the retractors in place in the L5-S1 disc space exposed, the midline was determined by [facility] x-ray and was marked on the L5 and S1 anterior bodies for reference.  Annulotomy was made with a 10 blade and Cobb elevator was used to dissect the bulk of the disc from the endplates, removing the disc in one large piece.  Posterior disc was then removed with combination of curettes and rongeurs.  The endplates were then prepared with curettes and rasps.  Good discectomy back to the posterior body was confirmed with x-ray.  Trials were then placed under x-ray guidance and an appropriately  sized implant was selected and filled with BMP and allograft.  Under x-ray guidance, this was tamped in place, with good restoration  of disc space height achieved as well as a small amount of additional lordosis restored.  Bone anchors were then tamped into the adjacent L5 and S1 bodies under x-ray guidance.     The retractors were removed and meticulous hemostasis was obtained in the retroperitoneal space.  The wound was irrigated thoroughly.  The anterior rectus sheath was then closed tightly with 0 vicryl in interrupted fashion.  The dermal layer was closed with 2-0 Vicryl stitches in buried interrupted fashion.  The skin was closed with 4 Monocryl in subcuticular manner followed by Dermabond.    Patient was then repositioned in the lateral decubitus position with the right side up.  All pressure points padded and the eyes were protected.  The patient and the bed was positioned for a true lateral and AP C-arm x-rays at each level.  The flank was preprepped with alcohol  and prepped and draped in sterile fashion.  A timeout was performed.  1% at lidocaine  with epinephrine  was injected in the planned incision.  Using the C-arm x-ray, a oblique incision was planned over the flank to span the 3 disc spaces at the concavity.  Incision was made with a 10 blade and the fascia was opened.  The external oblique, internal oblique, and transversalis muscle and transversalis fascia were opened bluntly with spreading instruments.  The retroperitoneal space was dissected and the peritoneal contents were reflected anteriorly.  The quadratus lumborum, transverse process and finally the psoas muscle was dissected out.  A initial dilator was placed on the lateral surface of the psoas muscle over the L3-4 disc space as confirmed by x-ray.  The dilator was then passed through the psoas muscle and docked onto the disc.  Stimulation showed there was no proximity of any lumbar plexus nerves.  The dilator was secured in place with a K wire and subsequent dilators were passed and stimulated and again yielded no neural activity nearby.  Retractor was then passed  onto the disc space and secured in place with a bed attachment.  The dilators were removed and the field on the posterior blade were directly stimulated.  There was no visual evidence or electromyographic evidence of nerves in the field.  The retractor was then opened cranially, caudally, and anteriorly and again the field was stimulated with no evidence of nerves in the field.  Annulotomy was performed with a retractable blade and discectomy was performed with pituitary rongeurs and box cutters.  The contralateral annulus was released with Cobb and the disc space was prepared with curettes and rasps.  Trial implants were placed and with the aid of fluoroscopy with AP and lateral projections, a expandable interbody implant was placed and expanded until snug using C-arm x-ray.  The retractor was then removed.    Attention was then turned to the L2-3  disc space where the lateral aspect of the psoas was palpated and initial dilator was placed over it with positioning confirmed by x-ray.  The dilator was then placed over the lateral osteophyte at the disc space and stimulated which revealed the lumbar plexus nerves were remote from the retractors.  In similar fashion, subsequent dilators were placed and stimulated and finally the retractor was placed and secured to the bed attachment.  The field was stimulated and no nerves were able to be identified in the field.  Posterior shim was placed to lock  the retractor in place and the retractor blades were opened cranially, caudally, and anteriorly.  The collapsed disc space was entered with a osteotome using x-ray guidance.  Subsequently, Cobb was used to release the contralateral annulus.  This allowed good opening of the disc space.  The disc was fairly degenerated, with minimal disc remnants, but complete discectomy was performed with curettes and pituitary rongeurs.  The endplates were prepared with rasps.  Trial implants were placed under C-arm guidance and a  expandable  implant was then placed and expanded under x-ray guidance.  As the implant had migrated to the front half of the disc space and lordosis had increased more than expected on the x-ray, I decided to place a lateral plate at this level to better secure the implant.  The plate  was attached to the implant and awl was used under x-ray guidance to cannulate the L2 and L3 bodies.  Screws were then placed using C arm guidance with good purchase.  The retractor was then removed.  Attention was then turned to L1-2 disc space.  Initial dilator was placed over the lateral psoas muscle.  The lateral psoas was stimulated to ensure no nerves were nearby.  Initial dilator was placed over the L1 to disc space as confirmed on x-ray.  Stimulation showed the lumbar plexus nerves were remote from the operative field.  Subsequent dilators were placed and stimulated and finally retractor was placed and secured with bed attachment.  The field was stimulated and there was no evidence of lumbar plexus nerves in the field including at the posterior blade.  The posterior blade was secured with a shim and the retractor was opened cranially, caudally, and anteriorly.  The field was again stimulated with no identification of lumbar plexus nerves.  The collapsed disc space was similarly opened with a annulotomy and discectomy was performed with rongeurs and ring curettes and the endplates were prepared with rasps.  Contralateral annulus was released with Cobb instruments.  Trials were placed and a expandable cage was placed and expanded under x-ray guidance and felt very snug.  The retractor was then removed.  There were no changes in the femoral nerve SSEPs throughout the case.  Final x-rays showed good reduction of spondylolisthesis and lateral listhesis, restoration of disc space height and foraminal height, and partial correction of her coronal deformity.  The retroperitoneal space was then irrigated and examined with no evidence  of any bleeding.  The fascia was closed with 0 Vicryl stitches.  The dermal layer was closed with 2-0 Vicryl stitches.  Skin was closed with 4-0 Monocryl subcuticular manner followed by Dermabond.  Patient was then extubated by the anesthesia service.  All counts are correct at the end of surgery.  No complications were noted.

## 2024-05-23 ENCOUNTER — Inpatient Hospital Stay (HOSPITAL_COMMUNITY)

## 2024-05-23 DIAGNOSIS — M419 Scoliosis, unspecified: Secondary | ICD-10-CM | POA: Diagnosis not present

## 2024-05-23 DIAGNOSIS — M4726 Other spondylosis with radiculopathy, lumbar region: Secondary | ICD-10-CM | POA: Diagnosis not present

## 2024-05-23 DIAGNOSIS — M48061 Spinal stenosis, lumbar region without neurogenic claudication: Secondary | ICD-10-CM | POA: Diagnosis not present

## 2024-05-23 DIAGNOSIS — Z981 Arthrodesis status: Secondary | ICD-10-CM | POA: Diagnosis not present

## 2024-05-23 LAB — POCT I-STAT 7, (LYTES, BLD GAS, ICA,H+H)
Acid-base deficit: 1 mmol/L (ref 0.0–2.0)
Bicarbonate: 23.3 mmol/L (ref 20.0–28.0)
Calcium, Ion: 1.23 mmol/L (ref 1.15–1.40)
HCT: 31 % — ABNORMAL LOW (ref 36.0–46.0)
Hemoglobin: 10.5 g/dL — ABNORMAL LOW (ref 12.0–15.0)
O2 Saturation: 100 %
Patient temperature: 35.2
Potassium: 3.9 mmol/L (ref 3.5–5.1)
Sodium: 136 mmol/L (ref 135–145)
TCO2: 24 mmol/L (ref 22–32)
pCO2 arterial: 32.7 mmHg (ref 32–48)
pH, Arterial: 7.454 — ABNORMAL HIGH (ref 7.35–7.45)
pO2, Arterial: 346 mmHg — ABNORMAL HIGH (ref 83–108)

## 2024-05-23 MED ORDER — CHLORHEXIDINE GLUCONATE CLOTH 2 % EX PADS
6.0000 | MEDICATED_PAD | Freq: Every day | CUTANEOUS | Status: DC
Start: 2024-05-23 — End: 2024-05-27
  Administered 2024-05-23 – 2024-05-26 (×4): 6 via TOPICAL

## 2024-05-23 NOTE — Anesthesia Postprocedure Evaluation (Signed)
 Anesthesia Post Note  Patient: JEMIMA PETKO  Procedure(s) Performed: ANTERIOR LUMBAR FUSION LUMBAR FIVE- SACRAL ONE RIGHT DIRECT ANTERIOR LATERAL LUMBAR FUSION LUMBAR ONE-LUMBAR TWO, LUMBAR TWO-LUMBAR THREE, LUMBAR THREE-LUMBAR FOUR, LUMBAR TWO-LUMBAR THREE ANTEROLATERAL PLATE (Right) ABDOMINAL EXPOSURE     Patient location during evaluation: PACU Anesthesia Type: General Level of consciousness: awake and alert Pain management: pain level controlled Vital Signs Assessment: post-procedure vital signs reviewed and stable Respiratory status: spontaneous breathing, nonlabored ventilation, respiratory function stable and patient connected to nasal cannula oxygen Cardiovascular status: blood pressure returned to baseline and stable Postop Assessment: no apparent nausea or vomiting Anesthetic complications: no   There were no known notable events for this encounter.  Last Vitals:  Vitals:   05/23/24 0811 05/23/24 1538  BP: (!) 115/42 (!) 106/53  Pulse: 63 66  Resp: 16 16  Temp: 36.5 C 36.7 C  SpO2: 97% 97%    Last Pain:  Vitals:   05/23/24 1816  TempSrc:   PainSc: Asleep                 Cordella SQUIBB Falana Clagg

## 2024-05-23 NOTE — Progress Notes (Signed)
 [facility]  Subjective  -states she is sore   Objective 125/62 72 98.3 F (36.8 C) (Oral) 18 96%  Intake/Output Summary (Last 24 hours) at 05/23/2024 0638 Last data filed at 05/23/2024 0424 Gross per 24 hour  Intake 2420 ml  Output 950 ml  Net 1470 ml    Abdomen with appropriate postop incisional tenderness Left DP palpable  Laboratory Lab Results: No results for input(s): WBC, HGB, HCT, PLT in the last 72 hours. BMET No results for input(s): NA, K, CL, CO2, GLUCOSE, BUN, CREATININE, CALCIUM  in the last 72 hours.  COAG Lab Results  Component Value Date   INR 1.0 11/03/2022   INR 0.9 06/16/2022   INR 0.96 06/15/2013   No results found for: PTT  Assessment/Planning:  Postop day 1 status post anterior spine exposure for L5-S1 ALIF.  Sitting up this morning getting her TLSO.  Left DP palpable.  Yesterday went well we are available as needed.  States she is passing gas.  Lonni JINNY Gaskins 05/23/2024 6:38 AM --

## 2024-05-23 NOTE — Evaluation (Signed)
 Physical Therapy Evaluation Patient Details Name: Kathryn [name] MRN: [id number] DOB: 1956-02-06 Today's Date: 05/23/2024  History of Present Illness  68 yo female s/p 9/15 ALIF L5-s1 L1-4 DLIF stage 1 PMH anxiety, arthritis, asthma, Guillian Barre syndrome ( 1970s) hernia, IBS  Clinical Impression  Pt admitted with above diagnosis. Lives at home with family, in a single-level home with 4 steps to enter; Prior to admission, pt was independnet; Presents to PT with decr activity tolerance, generalized weakness, and L hip pain with movement;  Needed min assist to roll , sit up to EOB, stand, and walk a short distance in her room; Pt currently with functional limitations due to the deficits listed below (see PT Problem List). Pt will benefit from skilled PT to increase their independence and safety with mobility to allow discharge to the venue listed below.           If plan is discharge home, recommend the following: A little help with walking and/or transfers;A little help with bathing/dressing/bathroom;Assistance with cooking/housework;Assist for transportation;Help with stairs or ramp for entrance   Can travel by private vehicle        Equipment Recommendations Rolling walker (2 wheels);BSC/3in1  Recommendations for Other Services       Functional Status Assessment Patient has had a recent decline in their functional status and demonstrates the ability to make significant improvements in function in a reasonable and predictable amount of time.     Precautions / Restrictions Precautions Precautions: Back Recall of Precautions/Restrictions: Impaired Precaution/Restrictions Comments: back precautions with handout provided Required Braces or Orthoses: Spinal Brace Spinal Brace: Thoracolumbosacral orthotic;Applied in sitting position Restrictions Weight Bearing Restrictions Per Provider Order: No      Mobility  Bed Mobility Overal bed mobility: Needs Assistance Bed Mobility:  Rolling, Supine to Sit, Sit to Supine Rolling: Min assist   Supine to sit: Min assist Sit to supine: Mod assist   General bed mobility comments: pt with mod cues for sequence with back precautions. Pt needs help to elevate trunk from bed surface and to place legs on teh bed surface    Transfers Overall transfer level: Needs assistance Equipment used: Rolling walker (2 wheels) Transfers: Sit to/from Stand Sit to Stand: Min assist           General transfer comment: Cues for hand placement; pulled up on RW    Ambulation/Gait Ambulation/Gait assistance: Contact guard assist, Min assist Gait Distance (Feet): 8 Feet Assistive device: Rolling walker (2 wheels) Gait Pattern/deviations: Step-to pattern Gait velocity: slowed     General Gait Details: Short steps, with L hip increasingly painful with each step; occasionally drifts into mild posterior lean, with need for Min assist to support  Stairs            Wheelchair Mobility     Tilt Bed    Modified Rankin (Stroke Patients Only)       Balance     Sitting balance-Leahy Scale: Fair       Standing balance-Leahy Scale: Poor (Noting occasional drift into mild posterior lean)                               Pertinent Vitals/Pain Pain Assessment Pain Assessment: Faces Faces Pain Scale: Hurts even more Pain Location: L hip area, as well as feeling sick on stomach Pain Descriptors / Indicators: Discomfort, Sore Pain Intervention(s): Monitored during session, Repositioned    Home Living Family/patient expects to be  discharged to:: Private residence Living Arrangements: Spouse/significant other Available Help at Discharge: Family;Available 24 hours/day Type of Home: House Home Access: Stairs to enter Entrance Stairs-Rails: Right;Left;Can reach both Entrance Stairs-Number of Steps: 4   Home Layout: One level Home Equipment: Shower seat - built in Additional Comments: no animals    Prior  Function Prior Level of Function : Independent/Modified Independent;Driving             Mobility Comments: no DME used, driver ADLs Comments: indep     Extremity/Trunk Assessment   Upper Extremity Assessment Upper Extremity Assessment: Defer to OT evaluation    Lower Extremity Assessment Lower Extremity Assessment: Generalized weakness;LLE deficits/detail LLE Deficits / Details: L hip with incr pain in standing, and in stance phase of gait    Cervical / Trunk Assessment Cervical / Trunk Assessment: Back Surgery  Communication   Communication Communication: No apparent difficulties Factors Affecting Communication: Other (comment) (though soft-spoken this afternoon)    Cognition Arousal: Alert Behavior During Therapy: WFL for tasks assessed/performed                           PT - Cognition Comments: Slow to answer question; most likely related to pain/nausea Following commands: Intact       Cueing Cueing Techniques: Verbal cues, Tactile cues     General Comments General comments (skin integrity, edema, etc.): Requested ice applied to her mid/lower abdomen    Exercises     Assessment/Plan    PT Assessment Patient needs continued PT services  PT Problem List Decreased strength;Decreased activity tolerance;Decreased balance;Decreased mobility;Decreased coordination;Decreased knowledge of use of DME;Decreased safety awareness;Decreased knowledge of precautions;Pain       PT Treatment Interventions DME instruction;Gait training;Stair training;Functional mobility training;Therapeutic activities;Therapeutic exercise;Balance training;Patient/family education;Manual techniques    PT Goals (Current goals can be found in the Care Plan section)  Acute Rehab PT Goals Patient Stated Goal: less pain PT Goal Formulation: With patient Time For Goal Achievement: 06/06/24 Potential to Achieve Goals: Good    Frequency Min 4X/week (anticipate 5x/week frequency  after second stage of surgery)     Co-evaluation               AM-PAC PT 6 Clicks Mobility  Outcome Measure Help needed turning from your back to your side while in a flat bed without using bedrails?: A Little Help needed moving from lying on your back to sitting on the side of a flat bed without using bedrails?: A Little Help needed moving to and from a bed to a chair (including a wheelchair)?: A Little Help needed standing up from a chair using your arms (e.g., wheelchair or bedside chair)?: A Little Help needed to walk in hospital room?: A Lot Help needed climbing 3-5 steps with a railing? : A Lot 6 Click Score: 16    End of Session Equipment Utilized During Treatment: Back brace Activity Tolerance: Patient limited by fatigue;Patient limited by pain (and nausea) Patient left: in bed;with call bell/phone within reach Nurse Communication: Mobility status PT Visit Diagnosis: Unsteadiness on feet (R26.81);Muscle weakness (generalized) (M62.81);Difficulty in walking, not elsewhere classified (R26.2)    Time: 8573-8556 PT Time Calculation (min) (ACUTE ONLY): 17 min   Charges:   PT Evaluation $PT Eval Moderate Complexity: 1 Mod   PT General Charges $$ ACUTE PT VISIT: 1 Visit         Silvano Currier, PT  Acute Rehabilitation Services Office [phone] Secure Chat welcomed  Silvano VEAR Currier 05/23/2024, 3:19 PM

## 2024-05-23 NOTE — Progress Notes (Addendum)
 Subjective: Patient reports preop radicular symptoms are resolved.  Complains of back soreness, also had some numbness over the dorsum of left foot. Objective: Vital signs in last 24 hours: Temp:  [97.7 F (36.5 C)-98.3 F (36.8 C)] 98.1 F (36.7 C) (09/16 1538) Pulse Rate:  [63-84] 66 (09/16 1538) Resp:  [11-18] 16 (09/16 1538) BP: (106-143)/(42-82) 106/53 (09/16 1538) SpO2:  [92 %-99 %] 97 % (09/16 1538)  Intake/Output from previous day: 09/15 0701 - 09/16 0700 In: 2420 [P.O.:720; I.V.:1500; IV Piggyback:200] Out: 950 [Urine:800; Blood:150] Intake/Output this shift: No intake/output data recorded.  No acute distress Incision dressings clean, dry, and intact Full strength in lower extremities  Lab Results: Recent Labs    05/22/24 0901  HGB 10.5*  HCT 31.0*   BMET Recent Labs    05/22/24 0901  NA 136  K 3.9    Studies/Results: DG SCOLIOSIS EVAL COMPLETE SPINE 2 OR 3 VIEWS Result Date: 05/23/2024 EXAM: SCOLIOSIS SERIES, 2 OR 3 VIEWS XRAY 05/23/2024 08:52:00 AM COMPARISON: Scoliosis series dated 06/21/2023. CLINICAL HISTORY: Other spondylosis with radiculopathy, lumbar region. Pt was wearing back brace during exam. FINDINGS: BONES: The patient is again noted to be status post interbody fusion and anterior spinal fixation at C4-5. There is completed fusion. There is also completed fusion at C5-6 and C6-7. The patient has undergone interbody fusion at L1-2, L2-3, L3-4 and L5-S1. There has also been interval right lateral fusion at L2-3. The patient is status post remote interbody fusion and bilateral posterolateral spinal fixation at L4-5. DISCS AND DEGENERATIVE CHANGES: There is mild disc space narrowing and endplate ridging at C3-4. There is a mild levocurvature of the cervicothoracic junction, approximately 12 degrees. Since the previous study, there appears to be marginal interval improvement of levoscoliosis of the lumbar spine but accurate measurement is precluded by new  orthopedic hardware. SOFT TISSUES: The visualized lungs and abdomen demonstrate no acute abnormality. IMPRESSION: 1. Mild levocurvature of the cervicothoracic junction, approximately 12 degrees. 2. Marginal interval improvement of levoscoliosis of the lumbar spine, although accurate measurement is precluded by new orthopedic hardware. Electronically signed by: Evalene Coho MD 05/23/2024 12:15 PM EDT RP Workstation: [id number]   DG Lumbar Spine 2-3 Views Result Date: 05/22/2024 CLINICAL DATA:  Elective surgery. EXAM: LUMBAR SPINE - 2-3 VIEW COMPARISON:  Preoperative imaging FINDINGS: Nine fluoroscopic spot views of the lumbar spine submitted from the operating room. Previous L4-L5 fusion. Interval anterior fusion at L5-S1. Interbody spacers present at L3-L4, L2-L3, and L1-L2 with lateral fusion at the L2-L3 level. Please reference operative report for details. Fluoroscopy time 5 minutes 40 seconds. Dose 187.57 mGy. IMPRESSION: Intraoperative fluoroscopy during lumbar spine surgery. Electronically Signed   By: Andrea Gasman M.D.   On: 05/22/2024 16:11   DG C-Arm 1-60 Min-No Report Result Date: 05/22/2024 Fluoroscopy was utilized by the requesting physician.  No radiographic interpretation.   DG C-Arm 1-60 Min-No Report Result Date: 05/22/2024 Fluoroscopy was utilized by the requesting physician.  No radiographic interpretation.   DG C-Arm 1-60 Min-No Report Result Date: 05/22/2024 Fluoroscopy was utilized by the requesting physician.  No radiographic interpretation.   DG C-Arm 1-60 Min-No Report Result Date: 05/22/2024 Fluoroscopy was utilized by the requesting physician.  No radiographic interpretation.   DG C-Arm 1-60 Min-No Report Result Date: 05/22/2024 Fluoroscopy was utilized by the requesting physician.  No radiographic interpretation.   DG C-Arm 1-60 Min-No Report Result Date: 05/22/2024 Fluoroscopy was utilized by the requesting physician.  No radiographic interpretation.   DG  C-Arm 1-60  Min-No Report Result Date: 05/22/2024 Fluoroscopy was utilized by the requesting physician.  No radiographic interpretation.   DG OR LOCAL ABDOMEN Result Date: 05/22/2024 CLINICAL DATA:  Status post L5-S1 ALIF. Evaluate for retained radiopaque soft tissue foreign body. EXAM: OR LOCAL ABDOMEN COMPARISON:  06/21/2023. FINDINGS: Posterior fusion hardware noted at L4-5, as before. New fusion hardware identified at L5-S1. New surgical clips overlie the upper sacrum. No evidence for unexpected radiopaque soft tissue foreign body within the visualized anatomy. IMPRESSION: No evidence for unexpected radiopaque soft tissue foreign body status post L5-S1 ALIF. Findings were called directly to [facility] in OR 21 at the time of study interpretation. Electronically Signed   By: Camellia Candle M.D.   On: 05/22/2024 09:49    Assessment/Plan: Status post stage I L5-S1 ALIF, L1-2, L2-3, L3-4 XLIF - X-ray was reviewed.  Good partial reduction of coronal imbalance and good reduction of listhesis. - Plan for stage II T10 to ilium on 9/18   Dorn KANDICE Ned 05/23/2024, 4:23 PM

## 2024-05-23 NOTE — Progress Notes (Signed)
 Orthopedic Tech Progress Note Patient Details:  Kathryn [name] May 06, 1956 [id number]  Ortho Devices Type of Ortho Device: Thoracolumbar corset (TLSO) Ortho Device/Splint Location: BACK Ortho Device/Splint Interventions: Ordered, Application, Adjustment  I delivered and fit the tlso. I picked up the lso. Post Interventions Patient Tolerated: Well Instructions Provided: Care of device, Adjustment of device  Chandra Dorn PARAS 05/23/2024, 6:07 AM

## 2024-05-23 NOTE — Evaluation (Signed)
 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 Pt at baseline indep. Pt reports a 1 fall in 2025 but not in the last 6 months. Pt reports hx of dizziness but never with vestibular workup. Pt currently with surgery soreness but able to progress with back education. OT to continue to follow closely acutely for second stage of surgery this week.  Patient will benefit from skilled OT acutely to increase independence and safety with ADLS to allow discharge HHOT.      If plan is discharge home, recommend the following:   A lot of help with walking and/or transfers;A lot of help with bathing/dressing/bathroom     Functional Status Assessment   Patient has had a recent decline in their functional status and demonstrates the ability to make significant improvements in function in a reasonable and predictable amount of time.     Equipment Recommendations   None recommended by OT     Recommendations for Other Services   PT consult     Precautions/Restrictions   Precautions Precautions: Back Recall of Precautions/Restrictions: Impaired Precaution/Restrictions Comments: back precautions with handout provided Required Braces or Orthoses: Spinal Brace Spinal Brace: Thoracolumbosacral orthotic;Applied in sitting position     Mobility Bed Mobility Overal bed mobility: Needs Assistance Bed Mobility: Rolling, Supine to Sit, Sit to Supine Rolling: Min assist   Supine to sit: Min assist Sit to supine: Mod assist   General bed mobility comments: pt with mod cues for sequence with back precautions.  Pt needs help to elevate trunk from bed surface and to place legs on teh bed surface    Transfers Overall transfer level: Needs assistance Equipment used: 1 person hand held assist Transfers: Sit to/from Stand, Bed to chair/wheelchair/BSC Sit to Stand: Min assist           General transfer comment: requires (A) to help balance. pt will benefit from RW next session      Balance Overall balance assessment: Needs assistance Sitting-balance support: Bilateral upper extremity supported, Feet supported Sitting balance-Leahy Scale: Fair     Standing balance support: Bilateral upper extremity supported, During functional activity, Reliant on assistive device for balance Standing balance-Leahy Scale: Poor                             ADL either performed or assessed with clinical judgement   ADL Overall ADL's : Needs assistance/impaired Eating/Feeding: Set up;Bed level   Grooming: Wash/dry face;Wash/dry hands;Bed level   Upper Body Bathing: Minimal assistance;Sitting   Lower Body Bathing: Moderate assistance;Sit to/from stand   Upper Body Dressing : Minimal assistance;Sitting Upper Body Dressing Details (indicate cue type and reason): Don back brace Lower Body Dressing: Moderate assistance;Sit to/from stand   Toilet Transfer: Minimal assistance Statistician Details (indicate cue type and reason): hand held (A)         Functional mobility during ADLs: Minimal assistance General ADL Comments: pt reports pain at hip area. pt educated on incision location as pt was not familiar  Back handout provided and reviewed adls in detail. Pt educated on: clothing between brace,  never sleep in brace, set an alarm at night for medication, avoid sitting for long periods of time, correct bed positioning for sleeping, correct sequence for bed mobility, avoiding lifting more than 5 pounds and never wash directly over incision. Handout provided and reviewed. Don doff brace completed  and reviewed with proper fit    Vision Baseline Vision/History: 1 Wears glasses Ability to See in Adequate Light: 0 Adequate Vision Assessment?: No apparent visual deficits;Wears glasses for reading     Perception         Praxis         Pertinent Vitals/Pain Pain Assessment Pain Assessment: Faces Faces Pain Scale: Hurts little more Pain Location: L hip area Pain Descriptors / Indicators: Discomfort, Sore Pain Intervention(s): Monitored during session, Premedicated before session, Repositioned, Limited activity within patient's tolerance     Extremity/Trunk Assessment Upper Extremity Assessment Upper Extremity Assessment: Overall WFL for tasks assessed   Lower Extremity Assessment Lower Extremity Assessment: Defer to PT evaluation   Cervical / Trunk Assessment Cervical / Trunk Assessment: Back Surgery   Communication Communication Communication: No apparent difficulties   Cognition Arousal: Alert Behavior During Therapy: WFL for tasks assessed/performed Cognition: No apparent impairments                               Following commands: Intact       Cueing  General Comments   Cueing Techniques: Verbal cues;Tactile cues  incision site R posterior and L anterior hip area dry at this time. Pt with ice pack on L hip area on arrival   Exercises     Shoulder Instructions      Home Living Family/patient expects to be discharged to:: Private residence Living Arrangements: Spouse/significant other Available Help at Discharge: Family;Available 24 hours/day Type of Home: House Home Access: Stairs to enter [employer] of Steps: 4 Entrance Stairs-Rails: Right;Left;Can reach both Home Layout: One level     Bathroom Shower/Tub: Producer, television/film/video: Handicapped height Bathroom Accessibility: Yes How Accessible: Accessible via walker Home Equipment: Shower seat - built in   Additional Comments: no animals      Prior  Functioning/Environment Prior Level of Function : Independent/Modified Independent;Driving             Mobility Comments: no DME used, driver ADLs Comments: indep    OT Problem List: Decreased strength;Decreased activity tolerance;Impaired balance (sitting and/or standing);Decreased safety awareness;Decreased knowledge of use of DME or AE;Decreased knowledge of precautions;Cardiopulmonary status limiting activity;Pain   OT Treatment/Interventions: Self-care/ADL training;Therapeutic exercise;Energy conservation;DME and/or AE instruction;Manual therapy;Therapeutic activities;Patient/family education;Balance training      OT Goals(Current goals can be found in the care plan section)   Acute Rehab OT Goals Patient Stated Goal: to be able to walk better OT Goal Formulation: With patient Time For Goal Achievement: 06/06/24 Potential to Achieve Goals: Good   OT Frequency:  Min 3X/week    Co-evaluation              AM-PAC OT 6 Clicks Daily Activity     Outcome Measure Help from another person eating meals?: None Help from another person taking care of personal grooming?: None Help from another person toileting, which includes using toliet, bedpan, or urinal?: A Lot Help from another person bathing (including washing, rinsing, drying)?: A Lot Help from another person to put on and taking off regular upper body clothing?: A Little Help from another person to put on and  taking off regular lower body clothing?: A Lot 6 Click Score: 17   End of Session Equipment Utilized During Treatment: Back brace Nurse Communication: Mobility status;Precautions  Activity Tolerance: Patient tolerated treatment well Patient left: in bed;with call bell/phone within reach  OT Visit Diagnosis: Unsteadiness on feet (R26.81);Muscle weakness (generalized) (M62.81)                Time: 9092-9073 OT Time Calculation (min): 19 min Charges:  OT General Charges $OT Visit: 1 Visit OT  Evaluation $OT Eval Moderate Complexity: 1 Mod   Brynn, OTR/L  Acute Rehabilitation Services Office: [phone] .   Ely Molt 05/23/2024, 9:48 AM

## 2024-05-24 ENCOUNTER — Encounter (HOSPITAL_COMMUNITY): Payer: Self-pay | Admitting: Neurosurgery

## 2024-05-24 LAB — BASIC METABOLIC PANEL WITH GFR
Anion gap: 11 (ref 5–15)
BUN: 12 mg/dL (ref 8–23)
CO2: 24 mmol/L (ref 22–32)
Calcium: 8.2 mg/dL — ABNORMAL LOW (ref 8.9–10.3)
Chloride: 97 mmol/L — ABNORMAL LOW (ref 98–111)
Creatinine, Ser: 0.93 mg/dL (ref 0.44–1.00)
GFR, Estimated: >60 mL/min (ref >60)
Glucose, Bld: 100 mg/dL — ABNORMAL HIGH (ref 70–99)
Potassium: 4.5 mmol/L (ref 3.5–5.1)
Sodium: 132 mmol/L — ABNORMAL LOW (ref 135–145)

## 2024-05-24 LAB — CBC WITH DIFFERENTIAL/PLATELET
Abs Immature Granulocytes: 0.06 K/uL (ref 0.00–0.07)
Basophils Absolute: 0 K/uL (ref 0.0–0.1)
Basophils Relative: 0 %
Eosinophils Absolute: 0.1 K/uL (ref 0.0–0.5)
Eosinophils Relative: 1 %
HCT: 30 % — ABNORMAL LOW (ref 36.0–46.0)
Hemoglobin: 10.3 g/dL — ABNORMAL LOW (ref 12.0–15.0)
Immature Granulocytes: 1 %
Lymphocytes Relative: 12 %
Lymphs Abs: 1.3 K/uL (ref 0.7–4.0)
MCH: 33.1 pg (ref 26.0–34.0)
MCHC: 34.3 g/dL (ref 30.0–36.0)
MCV: 96.5 fL (ref 80.0–100.0)
Monocytes Absolute: 1.4 K/uL — ABNORMAL HIGH (ref 0.1–1.0)
Monocytes Relative: 13 %
Neutro Abs: 7.9 K/uL — ABNORMAL HIGH (ref 1.7–7.7)
Neutrophils Relative %: 73 %
Platelets: 221 K/uL (ref 150–400)
RBC: 3.11 MIL/uL — ABNORMAL LOW (ref 3.87–5.11)
RDW: 12.5 % (ref 11.5–15.5)
WBC: 10.8 K/uL — ABNORMAL HIGH (ref 4.0–10.5)
nRBC: 0 % (ref 0.0–0.2)

## 2024-05-24 MED ORDER — POTASSIUM CHLORIDE IN NACL 20-0.9 MEQ/L-% IV SOLN
INTRAVENOUS | Status: DC
  Filled 2024-05-24 (×8): qty 1000

## 2024-05-24 NOTE — Progress Notes (Signed)
 Physical Therapy Treatment Patient Details Name: Kathryn [name] MRN: [id number] DOB: Oct 31, 1955 Today's Date: 05/24/2024   History of Present Illness 68 yo female s/p 9/15 ALIF L5-s1 L1-4 DLIF stage 1 PMH anxiety, arthritis, asthma, Guillian Barre syndrome ( 1970s) hernia, IBS    PT Comments  Continuing work on functional mobility and activity tolerance;  Session focused on bed mobility, transers, and progressive amb; Emphasized the need to be up and around today to keep the effects of bedrest away, and better prepare for getting up postop tomorrow; Able to get up with smoother rise, and tolerated longer distance amb in hallway; [name];   L hip pain continues to get in the way of activity tolerance; Able to get in a position of relative comfort in recliner with R foot propped up on towel roll;   Noting for surgery tomorrow   If plan is discharge home, recommend the following: A little help with walking and/or transfers;A little help with bathing/dressing/bathroom;Assistance with cooking/housework;Assist for transportation;Help with stairs or ramp for entrance   Can travel by private vehicle        Equipment Recommendations  Rolling walker (2 wheels);BSC/3in1    Recommendations for Other Services       Precautions / Restrictions Precautions Precautions: Back Precaution Booklet Issued: Yes (comment) Recall of Precautions/Restrictions: Intact Required Braces or Orthoses: Spinal Brace Spinal Brace: Thoracolumbosacral orthotic;Applied in sitting position Restrictions Weight Bearing Restrictions Per Provider Order: No     Mobility  Bed Mobility Overal bed mobility: Needs Assistance Bed Mobility: Rolling, Sidelying to Sit Rolling: Contact guard assist Sidelying to sit: Min assist       General bed mobility comments: Smoother rise to sit than yesterday's sesion    Transfers Overall transfer level: Needs assistance Equipment used: Rolling walker  (2 wheels) Transfers: Sit to/from Stand Sit to Stand: Min assist           General transfer comment: Cues for hand placement; Noting less sway and poterior lean    Ambulation/Gait Ambulation/Gait assistance: Contact guard assist, Min assist Gait Distance (Feet): 40 Feet Assistive device: Rolling walker (2 wheels) Gait Pattern/deviations: Decreased step length - left, Decreased stance time - right Gait velocity: slowed     General Gait Details: Cues for gait sequence to decr pressure on L hip in stance   Stairs             Wheelchair Mobility     Tilt Bed    Modified Rankin (Stroke Patients Only)       Balance     Sitting balance-Leahy Scale: Fair       Standing balance-Leahy Scale: Poor                              Communication Communication Communication: No apparent difficulties  Cognition Arousal: Alert Behavior During Therapy: WFL for tasks assessed/performed                             Following commands: Intact      Cueing Cueing Techniques: Verbal cues, Tactile cues  Exercises      General Comments General comments (skin integrity, edema, etc.): [name]      Pertinent Vitals/Pain Pain Assessment Pain Assessment: Faces Faces Pain Scale: Hurts whole lot Pain Location: L hip area, as well as feeling sick on stomach Pain Descriptors / Indicators: Discomfort,  Sore Pain Intervention(s): Monitored during session, Repositioned    Home Living                          Prior Function            PT Goals (current goals can now be found in the care plan section) Acute Rehab PT Goals Patient Stated Goal: less pain PT Goal Formulation: With patient Time For Goal Achievement: 06/06/24 Potential to Achieve Goals: Good Progress towards PT goals: Progressing toward goals    Frequency    Min 4X/week (anticipate 5x/week frequency after second stage of surgery)      PT  Plan      Co-evaluation              AM-PAC PT 6 Clicks Mobility   Outcome Measure  Help needed turning from your back to your side while in a flat bed without using bedrails?: A Little Help needed moving from lying on your back to sitting on the side of a flat bed without using bedrails?: A Little Help needed moving to and from a bed to a chair (including a wheelchair)?: A Little Help needed standing up from a chair using your arms (e.g., wheelchair or bedside chair)?: A Little Help needed to walk in hospital room?: A Lot Help needed climbing 3-5 steps with a railing? : A Lot 6 Click Score: 16    End of Session Equipment Utilized During Treatment: Back brace Activity Tolerance: Patient limited by fatigue;Patient limited by pain (and nausea) Patient left: in bed;with call bell/phone within reach Nurse Communication: Mobility status PT Visit Diagnosis: Unsteadiness on feet (R26.81);Muscle weakness (generalized) (M62.81);Difficulty in walking, not elsewhere classified (R26.2)     Time: 9082-9048 PT Time Calculation (min) (ACUTE ONLY): 34 min  Charges:    $Gait Training: 23-37 mins PT General Charges $$ ACUTE PT VISIT: 1 Visit                     Silvano Currier, PT  Acute Rehabilitation Services Office [phone] Secure Chat welcomed    Silvano VEAR Currier 05/24/2024, 10:52 AM

## 2024-05-24 NOTE — Anesthesia Preprocedure Evaluation (Signed)
 Anesthesia Evaluation  Patient identified by MRN, date of birth, ID band Patient awake    Reviewed: Allergy & Precautions, NPO status , Patient's Chart, lab work & pertinent test results  History of Anesthesia Complications (+) PONV and history of anesthetic complications  Airway Mallampati: II  TM Distance: >3 FB Neck ROM: Full    Dental  (+) Dental Advisory Given, Teeth Intact   Pulmonary shortness of breath, asthma , pneumonia, former smoker   Pulmonary exam normal breath sounds clear to auscultation       Cardiovascular negative cardio ROS Normal cardiovascular exam Rhythm:Regular Rate:Normal  Echo 2023  1. Left ventricular ejection fraction, by estimation, is 55 to 60%. The  left ventricle has normal function. The left ventricle has no regional  wall motion abnormalities. Left ventricular diastolic parameters are  consistent with Grade I diastolic  dysfunction (impaired relaxation).   2. Right ventricular systolic function is low normal. The right  ventricular size is normal. There is normal pulmonary artery systolic  pressure.   3. The mitral valve is grossly normal. Trivial mitral valve  regurgitation.   4. The aortic valve is tricuspid. Aortic valve regurgitation is not  visualized.   5. The inferior vena cava is normal in size with greater than 50%  respiratory variability, suggesting right atrial pressure of 3 mmHg.   Comparison(s): No prior Echocardiogram.     Neuro/Psych  PSYCHIATRIC DISORDERS Anxiety Depression    TIA Neuromuscular disease    GI/Hepatic Neg liver ROS, hiatal hernia,GERD  Medicated,,(+) Hepatitis -  Endo/Other  Hypothyroidism    Renal/GU negative Renal ROS     Musculoskeletal  (+) Arthritis ,    Abdominal Normal abdominal exam  (+)   Peds  Hematology Lab Results      Component                Value               Date                      WBC                      8.0                  05/18/2024                HGB                      12.5                05/18/2024                HCT                      37.7                05/18/2024                MCV                      97.7                05/18/2024                PLT                      302  05/18/2024             Lab Results      Component                Value               Date                      NA                       133 (L)             05/18/2024                K                        4.7                 05/18/2024                CO2                      26                  05/18/2024                GLUCOSE                  95                  05/18/2024                BUN                      9                   05/18/2024                CREATININE               0.92                05/18/2024                CALCIUM                   9.1                 05/18/2024                GFRNONAA                 >60                 05/18/2024              Anesthesia Other Findings   Reproductive/Obstetrics                              Anesthesia Physical Anesthesia Plan  ASA: 3  Anesthesia Plan: General   Post-op Pain Management: Ketamine  IV*, Sufentanil  infusion, Ofirmev  IV (intra-op)* and Gabapentin  PO (pre-op)*   Induction: Intravenous  PONV Risk Score and Plan: 4 or greater and Ondansetron , Dexamethasone , Treatment may vary due to age or medical condition, Midazolam  and Propofol  infusion  Airway Management Planned: Oral ETT and Video Laryngoscope Planned  Additional Equipment: Arterial line  Intra-op Plan:   Post-operative Plan: Extubation in OR  Informed Consent: I have reviewed the patients History and Physical, chart, labs and discussed the procedure including the risks, benefits and alternatives for the proposed anesthesia with the patient or authorized representative who has indicated his/her understanding and acceptance.     Dental advisory  given  Plan Discussed with: CRNA  Anesthesia Plan Comments: (2 x PIV vs. CVL  PAT note written 05/19/2024 by Allison Zelenak, PA-C.  )         Anesthesia Quick Evaluation

## 2024-05-24 NOTE — Progress Notes (Signed)
    Providing Compassionate, Quality Care - Together   NEUROSURGERY PROGRESS NOTE     S: No issues overnight.    O: EXAM:  BP 135/61 (BP Location: Left Arm)   Pulse 76   Temp 98.4 F (36.9 C) (Oral)   Resp 16   Ht 5' 7 (1.702 m)   Wt 72.6 kg   SpO2 97%   BMI 25.06 kg/m     Awake, alert, oriented  Speech fluent, appropriate  Standing/sitting at bedside upon exam Dressing c/d/i   ASSESSMENT:  68 y.o. Status post stage I L5-S1 ALIF, L1-2, L2-3, L3-4 XLIF     PLAN: -Stage II T10 to ilium on tmrw.  -NPO at midnight -Continue multimodal pain control -Continue therapies as tolerated -Call w/ questions/concerns.   Camie Pickle, [facility]

## 2024-05-25 ENCOUNTER — Inpatient Hospital Stay (HOSPITAL_COMMUNITY): Payer: Self-pay | Admitting: Certified Registered Nurse Anesthetist

## 2024-05-25 ENCOUNTER — Inpatient Hospital Stay (HOSPITAL_COMMUNITY)

## 2024-05-25 ENCOUNTER — Encounter (HOSPITAL_COMMUNITY): Payer: Self-pay | Admitting: Neurosurgery

## 2024-05-25 ENCOUNTER — Other Ambulatory Visit: Payer: Self-pay

## 2024-05-25 ENCOUNTER — Inpatient Hospital Stay (HOSPITAL_COMMUNITY)
Admission: RE | Disposition: A | Discharge status: Home Health | Payer: Self-pay | Source: Ambulatory Visit | Attending: Neurosurgery

## 2024-05-25 ENCOUNTER — Inpatient Hospital Stay (HOSPITAL_COMMUNITY): Admission: RE | Admit: 2024-05-25 | Source: Home / Self Care

## 2024-05-25 DIAGNOSIS — G459 Transient cerebral ischemic attack, unspecified: Secondary | ICD-10-CM | POA: Diagnosis not present

## 2024-05-25 DIAGNOSIS — E039 Hypothyroidism, unspecified: Secondary | ICD-10-CM

## 2024-05-25 DIAGNOSIS — F418 Other specified anxiety disorders: Secondary | ICD-10-CM | POA: Diagnosis not present

## 2024-05-25 DIAGNOSIS — M4126 Other idiopathic scoliosis, lumbar region: Secondary | ICD-10-CM | POA: Diagnosis not present

## 2024-05-25 SURGERY — POSTERIOR SPINAL FUSION, 8 LEVELS
Anesthesia: General | Site: Spine Lumbar

## 2024-05-25 MED ORDER — LIDOCAINE 2% (20 MG/ML) 5 ML SYRINGE
INTRAMUSCULAR | Status: DC | PRN
Start: 2024-05-25 — End: 2024-05-25
  Administered 2024-05-25: 80 mg via INTRAVENOUS

## 2024-05-25 MED ORDER — ENOXAPARIN SODIUM 40 MG/0.4ML IJ SOSY
40.0000 mg | PREFILLED_SYRINGE | INTRAMUSCULAR | Status: DC
  Administered 2024-05-25 – 2024-05-30 (×6): 40 mg via SUBCUTANEOUS
  Filled 2024-05-25 (×6): qty 0.4

## 2024-05-25 MED ORDER — THROMBIN 5000 UNITS EX KIT
PACK | CUTANEOUS | Status: AC
  Filled 2024-05-25: qty 1

## 2024-05-25 MED ORDER — CHLORHEXIDINE GLUCONATE 0.12 % MT SOLN
OROMUCOSAL | Status: AC
  Administered 2024-05-25: 15 mL via OROMUCOSAL
  Filled 2024-05-25: qty 15

## 2024-05-25 MED ORDER — CHLORHEXIDINE GLUCONATE CLOTH 2 % EX PADS: 6.0000 | MEDICATED_PAD | Freq: Once | CUTANEOUS | Status: DC

## 2024-05-25 MED ORDER — TRANEXAMIC ACID 1000 MG/10ML IV SOLN
2000.0000 mg | Freq: Once | INTRAVENOUS | Status: DC
  Filled 2024-05-25: qty 20

## 2024-05-25 MED ORDER — HYDROMORPHONE HCL 1 MG/ML IJ SOLN
INTRAMUSCULAR | Status: AC
  Filled 2024-05-25: qty 1

## 2024-05-25 MED ORDER — HYDROMORPHONE HCL 1 MG/ML IJ SOLN
0.2500 mg | INTRAMUSCULAR | Status: DC | PRN
  Administered 2024-05-25: 0.25 mg via INTRAVENOUS

## 2024-05-25 MED ORDER — FENTANYL CITRATE (PF) 250 MCG/5ML IJ SOLN
INTRAMUSCULAR | Status: AC
  Filled 2024-05-25: qty 5

## 2024-05-25 MED ORDER — BUPIVACAINE LIPOSOME 1.3 % IJ SUSP
INTRAMUSCULAR | Status: DC | PRN
  Administered 2024-05-25: 20 mL

## 2024-05-25 MED ORDER — SUGAMMADEX SODIUM 200 MG/2ML IV SOLN
INTRAVENOUS | Status: DC | PRN
Start: 2024-05-25 — End: 2024-05-25
  Administered 2024-05-25: 200 mg via INTRAVENOUS

## 2024-05-25 MED ORDER — KETAMINE HCL 50 MG/5ML IJ SOSY
PREFILLED_SYRINGE | INTRAMUSCULAR | Status: DC | PRN
  Administered 2024-05-25: 50 mg via INTRAVENOUS

## 2024-05-25 MED ORDER — PHENYLEPHRINE 80 MCG/ML (10ML) SYRINGE FOR IV PUSH (FOR BLOOD PRESSURE SUPPORT)
PREFILLED_SYRINGE | INTRAVENOUS | Status: DC | PRN
  Administered 2024-05-25 (×2): 160 ug via INTRAVENOUS

## 2024-05-25 MED ORDER — ACETAMINOPHEN 10 MG/ML IV SOLN
INTRAVENOUS | Status: DC | PRN
Start: 2024-05-25 — End: 2024-05-25
  Administered 2024-05-25: 1000 mg via INTRAVENOUS

## 2024-05-25 MED ORDER — CEFAZOLIN SODIUM-DEXTROSE 2-4 GM/100ML-% IV SOLN
2.0000 g | INTRAVENOUS | Status: AC
  Administered 2024-05-25 (×2): 2 g via INTRAVENOUS

## 2024-05-25 MED ORDER — CEFAZOLIN SODIUM-DEXTROSE 2-4 GM/100ML-% IV SOLN
INTRAVENOUS | Status: AC
  Filled 2024-05-25: qty 100

## 2024-05-25 MED ORDER — GABAPENTIN 300 MG PO CAPS
ORAL_CAPSULE | ORAL | Status: AC
  Administered 2024-05-25: 300 mg via ORAL
  Filled 2024-05-25: qty 1

## 2024-05-25 MED ORDER — MIDAZOLAM HCL 2 MG/2ML IJ SOLN
INTRAMUSCULAR | Status: DC | PRN
  Administered 2024-05-25: 2 mg via INTRAVENOUS

## 2024-05-25 MED ORDER — ORAL CARE MOUTH RINSE: 15.0000 mL | Freq: Once | OROMUCOSAL | Status: AC

## 2024-05-25 MED ORDER — LIDOCAINE-EPINEPHRINE 1 %-1:100000 IJ SOLN
INTRAMUSCULAR | Status: DC | PRN
  Administered 2024-05-25: 10 mL

## 2024-05-25 MED ORDER — DEXMEDETOMIDINE HCL IN NACL 80 MCG/20ML IV SOLN
INTRAVENOUS | Status: DC | PRN
  Administered 2024-05-25: 8 ug via INTRAVENOUS
  Administered 2024-05-25 (×2): 4 ug via INTRAVENOUS
  Administered 2024-05-25: 8 ug via INTRAVENOUS

## 2024-05-25 MED ORDER — BACITRACIN ZINC 500 UNIT/GM EX OINT
TOPICAL_OINTMENT | CUTANEOUS | Status: AC
  Filled 2024-05-25: qty 28.35

## 2024-05-25 MED ORDER — TRANEXAMIC ACID 1000 MG/10ML IV SOLN
INTRAVENOUS | Status: DC | PRN
  Administered 2024-05-25: 2000 mg via TOPICAL

## 2024-05-25 MED ORDER — LIDOCAINE-EPINEPHRINE 1 %-1:100000 IJ SOLN
INTRAMUSCULAR | Status: AC
Start: 2024-05-25 — End: 2024-05-25
  Filled 2024-05-25: qty 1

## 2024-05-25 MED ORDER — PHENYLEPHRINE HCL-NACL 20-0.9 MG/250ML-% IV SOLN
INTRAVENOUS | Status: DC | PRN
  Administered 2024-05-25: 40 ug/min via INTRAVENOUS

## 2024-05-25 MED ORDER — DROPERIDOL 2.5 MG/ML IJ SOLN: 0.6250 mg | Freq: Once | INTRAMUSCULAR | Status: DC | PRN

## 2024-05-25 MED ORDER — CHLORHEXIDINE GLUCONATE 0.12 % MT SOLN: 15.0000 mL | Freq: Once | OROMUCOSAL | Status: AC

## 2024-05-25 MED ORDER — DEXAMETHASONE SODIUM PHOSPHATE 10 MG/ML IJ SOLN
INTRAMUSCULAR | Status: DC | PRN
  Administered 2024-05-25: 10 mg via INTRAVENOUS

## 2024-05-25 MED ORDER — 0.9 % SODIUM CHLORIDE (POUR BTL) OPTIME
TOPICAL | Status: DC | PRN
  Administered 2024-05-25: 1000 mL

## 2024-05-25 MED ORDER — MIDAZOLAM HCL 2 MG/2ML IJ SOLN
INTRAMUSCULAR | Status: AC
  Filled 2024-05-25: qty 2

## 2024-05-25 MED ORDER — FENTANYL CITRATE (PF) 250 MCG/5ML IJ SOLN
INTRAMUSCULAR | Status: DC | PRN
  Administered 2024-05-25 (×2): 50 ug via INTRAVENOUS
  Administered 2024-05-25: 100 ug via INTRAVENOUS
  Administered 2024-05-25: 50 ug via INTRAVENOUS

## 2024-05-25 MED ORDER — ROCURONIUM BROMIDE 10 MG/ML (PF) SYRINGE
PREFILLED_SYRINGE | INTRAVENOUS | Status: DC | PRN
  Administered 2024-05-25: 50 mg via INTRAVENOUS
  Administered 2024-05-25: 80 mg via INTRAVENOUS
  Administered 2024-05-25: 20 mg via INTRAVENOUS

## 2024-05-25 MED ORDER — VANCOMYCIN HCL 1000 MG IV SOLR
INTRAVENOUS | Status: DC | PRN
  Administered 2024-05-25: 1000 mg

## 2024-05-25 MED ORDER — GABAPENTIN 300 MG PO CAPS: 300.0000 mg | ORAL_CAPSULE | Freq: Once | ORAL | Status: AC

## 2024-05-25 MED ORDER — ACETAMINOPHEN 10 MG/ML IV SOLN
INTRAVENOUS | Status: AC
Start: 2024-05-25 — End: 2024-05-25
  Filled 2024-05-25: qty 100

## 2024-05-25 MED ORDER — PROPOFOL 10 MG/ML IV BOLUS
INTRAVENOUS | Status: AC
  Filled 2024-05-25: qty 20

## 2024-05-25 MED ORDER — BUPIVACAINE HCL (PF) 0.5 % IJ SOLN
INTRAMUSCULAR | Status: AC
  Filled 2024-05-25: qty 30

## 2024-05-25 MED ORDER — LACTATED RINGERS IV SOLN: INTRAVENOUS | Status: DC

## 2024-05-25 MED ORDER — PROPOFOL 10 MG/ML IV BOLUS
INTRAVENOUS | Status: DC | PRN
  Administered 2024-05-25: 110 mg via INTRAVENOUS

## 2024-05-25 MED ORDER — THROMBIN 5000 UNITS EX SOLR
OROMUCOSAL | Status: DC | PRN
  Administered 2024-05-25 (×2): 5 mL via TOPICAL

## 2024-05-25 MED ORDER — BUPIVACAINE HCL (PF) 0.5 % IJ SOLN
INTRAMUSCULAR | Status: DC | PRN
  Administered 2024-05-25: 10 mL
  Administered 2024-05-25: 20 mL

## 2024-05-25 MED ORDER — ONDANSETRON HCL 4 MG/2ML IJ SOLN
INTRAMUSCULAR | Status: DC | PRN
  Administered 2024-05-25: 4 mg via INTRAVENOUS

## 2024-05-25 MED ORDER — LACTATED RINGERS IV SOLN: INTRAVENOUS | Status: DC | PRN

## 2024-05-25 MED ORDER — KETAMINE HCL 50 MG/5ML IJ SOSY
PREFILLED_SYRINGE | INTRAMUSCULAR | Status: AC
  Filled 2024-05-25: qty 5

## 2024-05-25 MED ORDER — VANCOMYCIN HCL 1000 MG IV SOLR
INTRAVENOUS | Status: AC
  Filled 2024-05-25: qty 20

## 2024-05-25 MED ORDER — CEFAZOLIN SODIUM-DEXTROSE 1-4 GM/50ML-% IV SOLN
1.0000 g | Freq: Three times a day (TID) | INTRAVENOUS | Status: AC
  Administered 2024-05-25 – 2024-05-28 (×9): 1 g via INTRAVENOUS
  Filled 2024-05-25 (×9): qty 50

## 2024-05-25 MED ORDER — BUPIVACAINE LIPOSOME 1.3 % IJ SUSP
INTRAMUSCULAR | Status: AC
  Filled 2024-05-25: qty 20

## 2024-05-25 MED ORDER — SUFENTANIL CITRATE 50 MCG/ML IV SOLN
0.5000 ug/kg/h | INTRAVENOUS | Status: DC
  Administered 2024-05-25 (×2): .2 ug/kg/h via INTRAVENOUS
  Filled 2024-05-25 (×3): qty 1

## 2024-05-25 MED ORDER — ALBUMIN HUMAN 5 % IV SOLN: INTRAVENOUS | Status: DC | PRN

## 2024-05-25 MED FILL — Heparin Sodium (Porcine) Inj 1000 Unit/ML: INTRAMUSCULAR | Qty: 30 | Status: AC

## 2024-05-25 MED FILL — Sodium Chloride IV Soln 0.9%: INTRAVENOUS | Qty: 1000 | Status: AC

## 2024-05-25 SURGICAL SUPPLY — 64 items
BAG COUNTER SPONGE SURGICOUNT (BAG) ×2 IMPLANT
BLADE CLIPPER SURG (BLADE) IMPLANT
BUR 14 MATCH 3 (BUR) IMPLANT
BUR MR8 14 BALL 5 (BUR) IMPLANT
CANISTER SUCTION 3000ML PPV (SUCTIONS) ×2 IMPLANT
DIGITIZER BENDINI (MISCELLANEOUS) IMPLANT
DRAPE C-ARM 42X72 X-RAY (DRAPES) ×4 IMPLANT
DRAPE LAPAROTOMY 100X72X124 (DRAPES) ×2 IMPLANT
DRAPE SHEET LG 3/4 BI-LAMINATE (DRAPES) ×8 IMPLANT
DRAPE SURG 17X23 STRL (DRAPES) ×2 IMPLANT
DRSG AQUACEL AG ADV 3.5X10 (GAUZE/BANDAGES/DRESSINGS) IMPLANT
DURAPREP 26ML APPLICATOR (WOUND CARE) ×2 IMPLANT
ELECT COATED BLADE 2.86 ST (ELECTRODE) IMPLANT
ELECT PENCIL ROCKER SW 15FT (MISCELLANEOUS) IMPLANT
ELECTRODE BLADE INSULATED 4IN (ELECTROSURGICAL) IMPLANT
ELECTRODE BLDE INSULATED 6.5IN (ELECTROSURGICAL) ×2 IMPLANT
ELECTRODE REM PT RTRN 9FT ADLT (ELECTROSURGICAL) ×2 IMPLANT
FEE COVERAGE SUPPORT O-ARM (MISCELLANEOUS) ×2 IMPLANT
GAUZE 4X4 16PLY ~~LOC~~+RFID DBL (SPONGE) IMPLANT
GAUZE SPONGE 4X4 12PLY STRL (GAUZE/BANDAGES/DRESSINGS) IMPLANT
GLOVE BIO SURGEON STRL SZ7 (GLOVE) ×8 IMPLANT
GLOVE BIOGEL PI IND STRL 7.5 (GLOVE) ×4 IMPLANT
GLOVE BIOGEL PI IND STRL 8 (GLOVE) ×8 IMPLANT
GLOVE ECLIPSE 8.0 STRL XLNG CF (GLOVE) ×2 IMPLANT
GOWN STRL REUS W/ TWL LRG LVL3 (GOWN DISPOSABLE) ×4 IMPLANT
GOWN STRL REUS W/ TWL XL LVL3 (GOWN DISPOSABLE) ×4 IMPLANT
GOWN STRL REUS W/TWL 2XL LVL3 (GOWN DISPOSABLE) IMPLANT
GRAFT BNE CHIP CANC 1-8 40 (Bone Implant) IMPLANT
HEMOSTAT POWDER KIT SURGIFOAM (HEMOSTASIS) ×2 IMPLANT
KIT BASIN OR (CUSTOM PROCEDURE TRAY) ×2 IMPLANT
KIT INFUSE SMALL (Orthopedic Implant) IMPLANT
KIT TURNOVER KIT B (KITS) ×2 IMPLANT
MARKER SPHERE PSV REFLC NDI (MISCELLANEOUS) ×10 IMPLANT
MODULE POWER NUVASIVE (MISCELLANEOUS) IMPLANT
NDL HYPO 18GX1.5 BLUNT FILL (NEEDLE) IMPLANT
NDL HYPO 22X1.5 SAFETY MO (MISCELLANEOUS) ×2 IMPLANT
NEEDLE HYPO 18GX1.5 BLUNT FILL (NEEDLE) IMPLANT
NEEDLE HYPO 22X1.5 SAFETY MO (MISCELLANEOUS) ×2 IMPLANT
NS IRRIG 1000ML POUR BTL (IV SOLUTION) ×2 IMPLANT
PACK LAMINECTOMY NEURO (CUSTOM PROCEDURE TRAY) ×2 IMPLANT
PAD ARMBOARD POSITIONER FOAM (MISCELLANEOUS) ×6 IMPLANT
ROD RELINE-O 5.5X300 STRT (Rod) IMPLANT
SCREW LOCK RELINE 5.5 TULIP (Screw) IMPLANT
SCREW PA RELINE 2 45X7.5 (Screw) IMPLANT
SCREW PA RELINE 2FS 5.5X50 (Screw) IMPLANT
SCREW PA TFNA RELINE 5.5X45 (Screw) IMPLANT
SCREW PA TFNA RELINE 6.5X40 (Screw) IMPLANT
SCREW RELINE 2FS POLY 6.5X45 (Screw) IMPLANT
SCREW SPINAL 2FS 6.5X50 (Screw) IMPLANT
SCREW SPINAL 2FS 7.5X50 (Screw) IMPLANT
SCREW SPINAL 8.5X100 RELINE (Screw) IMPLANT
SCREW SPINAL 8.5X80 RELINE (Screw) IMPLANT
SCREW SPINAL RELINE 2FS 5.5X40 (Screw) IMPLANT
SCREW SPINAL RELINE 2FS 6.5X55 (Screw) IMPLANT
SEALER BIPOLAR AQUA 6.0 (INSTRUMENTS) IMPLANT
SPIKE FLUID TRANSFER (MISCELLANEOUS) ×2 IMPLANT
SPONGE SURGIFOAM ABS GEL SZ50 (HEMOSTASIS) IMPLANT
SPONGE T-LAP 4X18 ~~LOC~~+RFID (SPONGE) IMPLANT
SUT MNCRL AB 4-0 PS2 18 (SUTURE) ×2 IMPLANT
SUT VIC AB 0 CT1 18XCR BRD8 (SUTURE) IMPLANT
SUT VIC AB 2-0 CP2 18 (SUTURE) ×2 IMPLANT
TOWEL GREEN STERILE (TOWEL DISPOSABLE) ×2 IMPLANT
TOWEL GREEN STERILE FF (TOWEL DISPOSABLE) ×2 IMPLANT
WATER STERILE IRR 1000ML POUR (IV SOLUTION) ×2 IMPLANT

## 2024-05-25 NOTE — Anesthesia Procedure Notes (Signed)
 Procedure Name: Intubation Date/Time: 05/25/2024 8:49 AM  Performed by: Lockie Flesher, CRNAPre-anesthesia Checklist: Patient identified, Emergency Drugs available, Suction available and Patient being monitored Patient Re-evaluated:Patient Re-evaluated prior to induction Oxygen Delivery Method: Circle System Utilized Preoxygenation: Pre-oxygenation with 100% oxygen Induction Type: IV induction Ventilation: Mask ventilation without difficulty Laryngoscope Size: Glidescope and 3 Grade View: Grade I Tube type: Oral Tube size: 7.0 mm Number of attempts: 1 Airway Equipment and Method: Stylet and Oral airway Placement Confirmation: ETT inserted through vocal cords under direct vision, positive ETCO2 and breath sounds checked- equal and bilateral Secured at: 22 cm Tube secured with: Tape Dental Injury: Teeth and Oropharynx as per pre-operative assessment

## 2024-05-25 NOTE — Progress Notes (Signed)
 Subjective: Patient reports having some back pain, bilateral leg pains though preop radicular pains resolved  Objective: Vital signs in last 24 hours: Temp:  [98 F (36.7 C)-98.4 F (36.9 C)] 98.2 F (36.8 C) (09/18 0736) Pulse Rate:  [73-76] 75 (09/18 0736) Resp:  [16-18] 18 (09/18 0736) BP: (129-148)/(60-73) 148/71 (09/18 0736) SpO2:  [97 %-100 %] 97 % (09/18 0736) Weight:  [72.6 kg] 72.6 kg (09/18 0736)  Intake/Output from previous day: 09/17 0701 - 09/18 0700 In: 1376.7 [P.O.:960; I.V.:416.7] Out: 3750 [Urine:3750] Intake/Output this shift: No intake/output data recorded.  NAD Dressings c/d/I R KE 4+/5 SLR testing elicts hip flexor pain   Lab Results: Recent Labs    05/22/24 0901 05/24/24 0730  WBC  --  10.8*  HGB 10.5* 10.3*  HCT 31.0* 30.0*  PLT  --  221   BMET Recent Labs    05/22/24 0901 05/24/24 0730  NA 136 132*  K 3.9 4.5  CL  --  97*  CO2  --  24  GLUCOSE  --  100*  BUN  --  12  CREATININE  --  0.93  CALCIUM   --  8.2*    Studies/Results: DG SCOLIOSIS EVAL COMPLETE SPINE 2 OR 3 VIEWS Result Date: 05/23/2024 EXAM: SCOLIOSIS SERIES, 2 OR 3 VIEWS XRAY 05/23/2024 08:52:00 AM COMPARISON: Scoliosis series dated 06/21/2023. CLINICAL HISTORY: Other spondylosis with radiculopathy, lumbar region. Pt was wearing back brace during exam. FINDINGS: BONES: The patient is again noted to be status post interbody fusion and anterior spinal fixation at C4-5. There is completed fusion. There is also completed fusion at C5-6 and C6-7. The patient has undergone interbody fusion at L1-2, L2-3, L3-4 and L5-S1. There has also been interval right lateral fusion at L2-3. The patient is status post remote interbody fusion and bilateral posterolateral spinal fixation at L4-5. DISCS AND DEGENERATIVE CHANGES: There is mild disc space narrowing and endplate ridging at C3-4. There is a mild levocurvature of the cervicothoracic junction, approximately 12 degrees. Since the previous  study, there appears to be marginal interval improvement of levoscoliosis of the lumbar spine but accurate measurement is precluded by new orthopedic hardware. SOFT TISSUES: The visualized lungs and abdomen demonstrate no acute abnormality. IMPRESSION: 1. Mild levocurvature of the cervicothoracic junction, approximately 12 degrees. 2. Marginal interval improvement of levoscoliosis of the lumbar spine, although accurate measurement is precluded by new orthopedic hardware. Electronically signed by: Evalene Coho MD 05/23/2024 12:15 PM EDT RP Workstation: [id number]    Assessment/Plan: T10 or T11-pelvis  posterior fusion surgery today - Risks, benefits, alternatives, and expected convalescence were discussed with her.  Risks discussed included, but were not limited to bleeding, pain, infection, scar, spinal fluid leak, neurologic deficit, instability, pseudoarthrosis, damage to nearby organs, and death.  Informed consent was obtained.    Dorn KANDICE Ned 05/25/2024, 8:29 AM

## 2024-05-25 NOTE — Transfer of Care (Signed)
 Immediate Anesthesia Transfer of Care Note  Patient: Kathryn [name]  Procedure(s) Performed: POSTERIOR SPINAL FUSION THORACIC TEN-ILIUM, EXPLORATION/REVISION OF PREVIOUS FUSION (Spine Lumbar) APPLICATION OF O-ARM  Patient Location: PACU  Anesthesia Type:General  Level of Consciousness: awake, alert , and oriented  Airway & Oxygen Therapy: Patient Spontanous Breathing  Post-op Assessment: Report given to RN and Post -op Vital signs reviewed and stable  Post vital signs: Reviewed and stable  Last Vitals:  Vitals Value Taken Time  BP 85/46 05/25/24 13:45  Temp    Pulse 88 05/25/24 13:48  Resp 3 05/25/24 13:48  SpO2 94 % 05/25/24 13:48  Vitals shown include unfiled device data.  Last Pain:  Vitals:   05/25/24 0749  TempSrc:   PainSc: 0-No pain      Patients Stated Pain Goal: 3 (05/23/24 2146)  Complications: No notable events documented.

## 2024-05-25 NOTE — Plan of Care (Signed)

## 2024-05-25 NOTE — Op Note (Signed)
 PREOP DIAGNOSIS: Degenerative scoliosis and spondylolisthesis, lumbar stenosis    POSTOP DIAGNOSIS: Degenerative scoliosis and spondylolisthesis, lumbar stenosis    PROCEDURE: 1.  T11-12, T12-L1, L1-2, L2-L3, L3-L4, L4-L5, L5-S1, sacrum-ilium posterior arthrodesis 2.  Exploration of previous L4-5 posterior fusion and and removal of previous L4-5 posterior instrumentation 2. Segmental instrumentation with pedicle and sacroiliac screws and rod construct at T11-T12-L1-L2-L3-L4-L5-S1-S2AI 5. Harvest of local autograft 6. Use of morselized allograft 7.  Intraoperative CT and neuronavigation with Med[employer]onic Stealth   SURGEON: Dr. Dorn Ned, MD   ASSISTANT: Dr. Dorn Glade MD, Lauraine Pickle, PA.  Please note, there were no qualified trainees available to assist with the procedure.  Assistance was provided through the entire duration of the procedure.  ANESTHESIA: General Endotracheal   EBL: 200 ml   IMPLANTS:  NuVasive screws T11: 6.5 x 40 mm T12: 6.5 x 40 left, 5.5 x 40 right L1: 5.5 x 45 mm L2: 5.5 x 50 mm L3: 6.5 x 55 mm L4: 6.5 x 50 mm L5: 6.5 x 45 mm S1: 7.5 x 50 mm left, 7.5 x 45 mm right S2 AI: 8.5 x 100 mm left, 8.5 x 80 mm right  [place] chrome rods x2,  5.5 mm x 265/260 mm Cancellous bone chips Small BMP  SPECIMENS: None   DRAINS: 10 flat JP   COMPLICATIONS: None   CONDITION: Stable to PACU   HISTORY: This is a 68 year old woman with history of L4-5 TLIF who 2 days ago underwent stage I L5-S1 ALIF, L1-4 XLIF.  She wished to proceed with stage II surgery with thoracolumbar instrumentation and fusion.  On her x-ray after stage I, she achieved excellent coronal correction and based on this x-ray, instrumentation and fusion from T11 to ilium was recommended. Risks, benefits, alternatives, and expected convalescence were discussed with the patient.  Risks discussed included but were not limited to bleeding, pain, infection, scar, pseudoarthrosis, CSF leak,  neurologic deficit, paralysis, and death.  The patient wished to proceed with surgery and informed consent was obtained.   PROCEDURE IN DETAIL: After informed consent was obtained and witnessed, the patient was brought to the operating room. After induction of general anesthesia, the patient was positioned on the operative table in the prone position on a open Jackson table with all pressure points meticulously padded. The skin of the low back was then prepped and draped in the usual sterile fashion.  1% lidocaine  with epinephrine  was injected in the skin and a timeout was performed.  Preoperative antibiotics were administered.  Midline incision was made with a 10 blade from T11 to the S2 level of the sacrum.  Subcutaneous tissue and the fascia was incised with monopolar electrocautery.  The paraspinous muscles were dissected off the lamina in subperiosteal fashion.  Additional dissection continued out laterally, exposing the transverse processes of T11, L1, L2, L3, and sacral ala bilaterally which were then decorticated.  Previous pedicle screw and rod instrumentation at L4-5 was dissected out of the scar and removed.  Spinous process clamp was placed on T10 spinous process and intraoperative CT was performed with O-arm.  L4 and L5 pedicle screws were replaced using upsize navigated screws.  The S1-2 sacral foramina were identified and using navigated drill, pilot holes for S2 AI screws were placed inferior to this.  4.5 mm navigated awl tip tap was used to traverse the sacral ala through the sacroiliac joint into the ilium.  7. 5 mm tap was then used to further expand the trajectory.  Navigated S2  AI screws were then placed with excellent purchase.  T11-12, T12-L1, L1-L2, L2-L3, L3-L4, L5-S1 facets were decorticated with rongeur and using neuronavigation, navigated drill was used to drill pilot holes for pedicle screws at each of the levels.  Navigated awl tip tap was used to cannulate the pedicles and ball  ended feeler confirmed good bony channels.  Navigated screws were then placed with good purchase at T11, T12, L1, L2, L3, and S1 bilaterally.  AP and lateral x-ray as well as another intraoperative CT confirmed excellent placement of the pedicle screws and S2 AI screws.  Spinous process clamp was removed.  Decortication was then performed at T11-12, T12-L1, L1-L2, L2-L3, L3-L4, remaining facet of L4-L5, and L5-S1.  Using Bendini system, rods were contoured to fit the tulip heads and were secured with screw caps and final tightened.  Allograft was placed in the lateral gutters and interlaminar space bilaterally.  Small BMP was placed at T11-T12-L1 interspace as well as at the L5-S1 interspace.  Medium JP drain was placed in the subfascial space and tunneled out the skin and secured with a stitch.    Wound was irrigated thoroughly.  Exparel  mixed with Marcaine  was injected into the paraspinous muscles and subcutaneous tissues bilaterally.  Vancomycin  powder was sprinkled into the wound.  The fascia was closed with 0 Vicryl stitches.  The dermal layer was closed with 2-0 Vicryl stitches in buried interrupted fashion.  The skin incision was closed with surgical staples.  Sterile dressings was placed.  Patient was then flipped supine and extubated by the anesthesia service following commands and all 4 extremities.  All counts were correct at the end of surgery.  No complications were noted.

## 2024-05-26 LAB — CBC
HCT: 23.1 % — ABNORMAL LOW (ref 36.0–46.0)
Hemoglobin: 7.9 g/dL — ABNORMAL LOW (ref 12.0–15.0)
MCH: 33.6 pg (ref 26.0–34.0)
MCHC: 34.2 g/dL (ref 30.0–36.0)
MCV: 98.3 fL (ref 80.0–100.0)
Platelets: 216 K/uL (ref 150–400)
RBC: 2.35 MIL/uL — ABNORMAL LOW (ref 3.87–5.11)
RDW: 12.8 % (ref 11.5–15.5)
WBC: 11.9 K/uL — ABNORMAL HIGH (ref 4.0–10.5)
nRBC: 0 % (ref 0.0–0.2)

## 2024-05-26 LAB — BASIC METABOLIC PANEL WITH GFR
Anion gap: 5 (ref 5–15)
BUN: 11 mg/dL (ref 8–23)
CO2: 24 mmol/L (ref 22–32)
Calcium: 7.6 mg/dL — ABNORMAL LOW (ref 8.9–10.3)
Chloride: 101 mmol/L (ref 98–111)
Creatinine, Ser: 0.81 mg/dL (ref 0.44–1.00)
GFR, Estimated: >60 mL/min (ref >60)
Glucose, Bld: 117 mg/dL — ABNORMAL HIGH (ref 70–99)
Potassium: 4.5 mmol/L (ref 3.5–5.1)
Sodium: 130 mmol/L — ABNORMAL LOW (ref 135–145)

## 2024-05-26 MED ORDER — CYCLOBENZAPRINE HCL 10 MG PO TABS
10.0000 mg | ORAL_TABLET | Freq: Three times a day (TID) | ORAL | Status: DC | PRN
  Administered 2024-05-26 – 2024-05-31 (×9): 10 mg via ORAL
  Filled 2024-05-26 (×9): qty 1

## 2024-05-26 MED ORDER — KETOROLAC TROMETHAMINE 15 MG/ML IJ SOLN
15.0000 mg | Freq: Four times a day (QID) | INTRAMUSCULAR | Status: AC | PRN
  Administered 2024-05-27 – 2024-05-30 (×5): 15 mg via INTRAVENOUS
  Filled 2024-05-26 (×5): qty 1

## 2024-05-26 MED ORDER — HYDROMORPHONE 1 MG/ML IV SOLN
INTRAVENOUS | Status: DC
  Administered 2024-05-26 – 2024-05-27 (×2): 30 mg via INTRAVENOUS
  Administered 2024-05-27: 0.6 mg via INTRAVENOUS
  Administered 2024-05-28: 0.9 mg via INTRAVENOUS
  Filled 2024-05-26: qty 30

## 2024-05-26 MED ORDER — DIPHENHYDRAMINE HCL 12.5 MG/5ML PO ELIX: 12.5000 mg | ORAL_SOLUTION | Freq: Four times a day (QID) | ORAL | Status: DC | PRN

## 2024-05-26 MED ORDER — SODIUM CHLORIDE 0.9% FLUSH: 9.0000 mL | INTRAVENOUS | Status: DC | PRN

## 2024-05-26 MED ORDER — DIPHENHYDRAMINE HCL 50 MG/ML IJ SOLN: 12.5000 mg | Freq: Four times a day (QID) | INTRAMUSCULAR | Status: DC | PRN

## 2024-05-26 MED ORDER — GABAPENTIN 100 MG PO CAPS
100.0000 mg | ORAL_CAPSULE | Freq: Three times a day (TID) | ORAL | Status: DC
  Administered 2024-05-26 – 2024-05-31 (×15): 100 mg via ORAL
  Filled 2024-05-26 (×16): qty 1

## 2024-05-26 MED ORDER — NALOXONE HCL 0.4 MG/ML IJ SOLN: 0.4000 mg | INTRAMUSCULAR | Status: DC | PRN

## 2024-05-26 MED FILL — Thrombin For Soln 5000 Unit: CUTANEOUS | Qty: 5000 | Status: AC

## 2024-05-26 NOTE — Anesthesia Postprocedure Evaluation (Signed)
 Anesthesia Post Note  Patient: Kathryn [name]  Procedure(s) Performed: POSTERIOR SPINAL FUSION THORACIC TEN-ILIUM, EXPLORATION/REVISION OF PREVIOUS FUSION (Spine Lumbar) APPLICATION OF O-ARM     Patient location during evaluation: PACU Anesthesia Type: General Level of consciousness: sedated and patient cooperative Pain management: pain level controlled Vital Signs Assessment: post-procedure vital signs reviewed and stable Respiratory status: spontaneous breathing Cardiovascular status: stable Anesthetic complications: no   No notable events documented.  Last Vitals:  Vitals:   05/25/24 1529 05/25/24 1941  BP: (!) 102/57 (!) 105/47  Pulse: 76 79  Resp: 13 18  Temp: 36.6 C 36.8 C  SpO2: 95% 100%    Last Pain:  Vitals:   05/26/24 0651  TempSrc:   PainSc: 8                  Norleen Pope

## 2024-05-26 NOTE — Progress Notes (Signed)
 OT Cancellation Note  Patient Details Name: Kathryn [name] MRN: [id number] DOB: 1955/12/20   Cancelled Treatment:    Reason Eval/Treat Not Completed: Pain limiting ability to participate. Awaiting PCA pump prior to mobilizing. Will continue to follow.  Kennth Mliss Helling 05/26/2024, 4:14 PM Mliss HERO, OTR/L Acute Rehabilitation Services Office: [phone]

## 2024-05-26 NOTE — Care Management Important Message (Signed)
 Important Message  Patient Details  Name: Kathryn [name] MRN: [id number] Date of Birth: May 17, 1956   Important Message Given:  Yes - Medicare IM     Jon Cruel 05/26/2024, 3:44 PM

## 2024-05-26 NOTE — Plan of Care (Signed)
  Problem: Education: Goal: Knowledge of General Education information will improve Description: Including pain rating scale, medication(s)/side effects and non-pharmacologic comfort measures Outcome: Progressing   Problem: Health Behavior/Discharge Planning: Goal: Ability to manage health-related needs will improve Outcome: Progressing   Problem: Clinical Measurements: Goal: Ability to maintain clinical measurements within normal limits will improve Outcome: Progressing Goal: Will remain free from infection Outcome: Progressing Goal: Diagnostic test results will improve Outcome: Progressing   Problem: Activity: Goal: Risk for activity intolerance will decrease Outcome: Not Progressing   Problem: Nutrition: Goal: Adequate nutrition will be maintained Outcome: Progressing

## 2024-05-26 NOTE — Progress Notes (Signed)
    Providing Compassionate, Quality Care - Together   NEUROSURGERY PROGRESS NOTE     S: No issues overnight. C/o significant pain near operative sites. Denies dizziness, blurry vision.    O: EXAM:  BP (!) 105/47 (BP Location: Left Arm)   Pulse 79   Temp 98.2 F (36.8 C)   Resp 18   Ht 5' 7 (1.702 m)   Wt 72.6 kg   SpO2 100%   BMI 25.06 kg/m   CBC    Component Value Date/Time   WBC 11.9 (H) 05/26/2024 0429   RBC 2.35 (L) 05/26/2024 0429   HGB 7.9 (L) 05/26/2024 0429   HCT 23.1 (L) 05/26/2024 0429   PLT 216 05/26/2024 0429   MCV 98.3 05/26/2024 0429   MCH 33.6 05/26/2024 0429   MCHC 34.2 05/26/2024 0429   RDW 12.8 05/26/2024 0429   LYMPHSABS 1.3 05/24/2024 0730   MONOABS 1.4 (H) 05/24/2024 0730   EOSABS 0.1 05/24/2024 0730   BASOSABS 0.0 05/24/2024 0730       Latest Ref Rng & Units 05/26/2024    4:29 AM 05/24/2024    7:30 AM 05/22/2024    9:01 AM  BMP  Glucose 70 - 99 mg/dL 882  899    BUN 8 - 23 mg/dL 11  12    Creatinine 9.55 - 1.00 mg/dL 9.18  9.06    Sodium 864 - 145 mmol/L 130  132  136   Potassium 3.5 - 5.1 mmol/L 4.5  4.5  3.9   Chloride 98 - 111 mmol/L 101  97    CO2 22 - 32 mmol/L 24  24    Calcium  8.9 - 10.3 mg/dL 7.6  8.2        Awake, alert, oriented  Speech fluent, appropriate  Strength/sensation grossly intact Dressing c/d/I JP  in place   ASSESSMENT:  68 y.o. s/p staged L5-S1 ALIF, L1-2, L2-3, L3-4 XLIF and posterior spinal fusion/instrumentation T11-ilium    PLAN: -Multimodal pain control - added Toradol , Flexeril , increased Gabapentin .  -Continue JP drain -Therapies as tolerated - suspect she would be a good rehab candidate -TLSO when OOB -Encourage oral rehydration and sodium intake.  -Call w/ questions/concerns.   Camie Pickle, [facility]

## 2024-05-26 NOTE — Progress Notes (Signed)
 PT Cancellation Note  Patient Details Name: TINEY ZIPPER MRN: [id number] DOB: 1956/02/07   Cancelled Treatment:    Reason Eval/Treat Not Completed: Pain limiting ability to participate (Pt reporting 10/10 back pain. Per RN, pt is missing a component from PCA pump and unable to recieve pain medicine. Will follow-up for PT as schedule permits.)  Randall JONELLE, PT, DPT Acute Rehabilitation Services Office: [phone] Secure Chat Preferred  Delon CHRISTELLA Callander 05/26/2024, 4:15 PM

## 2024-05-26 NOTE — Plan of Care (Signed)

## 2024-05-27 LAB — BASIC METABOLIC PANEL WITH GFR
Anion gap: 10 (ref 5–15)
BUN: 8 mg/dL (ref 8–23)
CO2: 23 mmol/L (ref 22–32)
Calcium: 7.7 mg/dL — ABNORMAL LOW (ref 8.9–10.3)
Chloride: 96 mmol/L — ABNORMAL LOW (ref 98–111)
Creatinine, Ser: 0.66 mg/dL (ref 0.44–1.00)
GFR, Estimated: >60 mL/min (ref >60)
Glucose, Bld: 110 mg/dL — ABNORMAL HIGH (ref 70–99)
Potassium: 3.8 mmol/L (ref 3.5–5.1)
Sodium: 129 mmol/L — ABNORMAL LOW (ref 135–145)

## 2024-05-27 LAB — CBC
HCT: 21.3 % — ABNORMAL LOW (ref 36.0–46.0)
Hemoglobin: 7.4 g/dL — ABNORMAL LOW (ref 12.0–15.0)
MCH: 33.5 pg (ref 26.0–34.0)
MCHC: 34.7 g/dL (ref 30.0–36.0)
MCV: 96.4 fL (ref 80.0–100.0)
Platelets: 215 K/uL (ref 150–400)
RBC: 2.21 MIL/uL — ABNORMAL LOW (ref 3.87–5.11)
RDW: 12.8 % (ref 11.5–15.5)
WBC: 13.5 K/uL — ABNORMAL HIGH (ref 4.0–10.5)
nRBC: 0 % (ref 0.0–0.2)

## 2024-05-27 NOTE — Evaluation (Signed)
 Occupational Therapy Evaluation Patient Details Name: Kathryn [name] MRN: [id number] DOB: 1956/04/03 Today's Date: 05/27/2024   History of Present Illness   68 yo female  s/p staged L5-S1 ALIF, L1-2, L2-3, L3-4 XLIF and posterior spinal fusion/instrumentation.T11-ilium1 PMH anxiety, arthritis, asthma, Guillian Barre syndrome ( 1970s) hernia, IBS     Clinical Impressions Pt seen s/p back surgery, pt with incr pain and BLE buckling with mobility. Pt currently needs up to max A for ADLs, min A for bed mobility and min A for transfers with RW. Pt's spouse assisting throughout session. Reiterated back precautions and brace wear with pt during session, pt verbalized understanding. Pt presenting with impairments listed below, will follow acutely. Continue to recommend HHOT at d/c.     If plan is discharge home, recommend the following:   A lot of help with walking and/or transfers;A lot of help with bathing/dressing/bathroom     Functional Status Assessment   Patient has had a recent decline in their functional status and demonstrates the ability to make significant improvements in function in a reasonable and predictable amount of time.     Equipment Recommendations   Other (comment) (RW)     Recommendations for Other Services   PT consult     Precautions/Restrictions   Precautions Precautions: Back Precaution Booklet Issued: Yes (comment) Recall of Precautions/Restrictions: Intact Precaution/Restrictions Comments: back precautions with handout provided, JP drain Required Braces or Orthoses: Spinal Brace Spinal Brace: Thoracolumbosacral orthotic;Applied in sitting position Restrictions Weight Bearing Restrictions Per Provider Order: No     Mobility Bed Mobility Overal bed mobility: Needs Assistance Bed Mobility: Rolling, Sidelying to Sit Rolling: Contact guard assist Sidelying to sit: Min assist            Transfers Overall transfer level: Needs  assistance Equipment used: Rolling walker (2 wheels) Transfers: Sit to/from Stand Sit to Stand: Min assist                  Balance Overall balance assessment: Needs assistance Sitting-balance support: Bilateral upper extremity supported, Feet supported Sitting balance-Leahy Scale: Fair Sitting balance - Comments: needs BUE support to offload back pain   Standing balance support: Bilateral upper extremity supported, During functional activity, Reliant on assistive device for balance Standing balance-Leahy Scale: Poor Standing balance comment: RW for ambulation                           ADL either performed or assessed with clinical judgement   ADL Overall ADL's : Needs assistance/impaired Eating/Feeding: Set up;Sitting   Grooming: Set up;Sitting   Upper Body Bathing: Minimal assistance   Lower Body Bathing: Maximal assistance   Upper Body Dressing : Minimal assistance   Lower Body Dressing: Maximal assistance   Toilet Transfer: Minimal assistance;Ambulation;Rolling walker (2 wheels);Comfort height toilet   Toileting- Clothing Manipulation and Hygiene: Minimal assistance       Functional mobility during ADLs: Minimal assistance;Rolling walker (2 wheels)       Vision Baseline Vision/History: 1 Wears glasses Vision Assessment?: No apparent visual deficits;Wears glasses for reading     Perception Perception: Not tested       Praxis Praxis: Not tested       Pertinent Vitals/Pain Pain Assessment Pain Assessment: Faces Pain Score: 4  Faces Pain Scale: Hurts little more Pain Location: L hip and back Pain Descriptors / Indicators: Discomfort, Sore Pain Intervention(s): Limited activity within patient's tolerance     Extremity/Trunk Assessment Upper Extremity Assessment Upper  Extremity Assessment: Generalized weakness   Lower Extremity Assessment Lower Extremity Assessment: Defer to PT evaluation   Cervical / Trunk Assessment Cervical /  Trunk Assessment: Back Surgery   Communication Communication Communication: No apparent difficulties   Cognition Arousal: Alert Behavior During Therapy: WFL for tasks assessed/performed Cognition: No apparent impairments                               Following commands: Intact       Cueing  General Comments   Cueing Techniques: Verbal cues;Tactile cues  VSS   Exercises     Shoulder Instructions      Home Living Family/patient expects to be discharged to:: Private residence Living Arrangements: Spouse/significant other Available Help at Discharge: Family;Available 24 hours/day Type of Home: House Home Access: Stairs to enter [employer] of Steps: 4 Entrance Stairs-Rails: Right;Left;Can reach both Home Layout: One level     Bathroom Shower/Tub: Producer, television/film/video: Handicapped height Bathroom Accessibility: Yes How Accessible: Accessible via walker Home Equipment: Shower seat - built in;Toilet riser;BSC/3in1;Cane - single point          Prior Functioning/Environment Prior Level of Function : Independent/Modified Independent;Driving             Mobility Comments: no DME used, driver ADLs Comments: indep    OT Problem List: Decreased strength;Decreased activity tolerance;Impaired balance (sitting and/or standing);Decreased safety awareness;Decreased knowledge of use of DME or AE;Decreased knowledge of precautions;Cardiopulmonary status limiting activity;Pain   OT Treatment/Interventions: Self-care/ADL training;Therapeutic exercise;Energy conservation;DME and/or AE instruction;Manual therapy;Therapeutic activities;Patient/family education;Balance training      OT Goals(Current goals can be found in the care plan section)   Acute Rehab OT Goals Patient Stated Goal: none stated OT Goal Formulation: With patient Time For Goal Achievement: 06/06/24 Potential to Achieve Goals: Good   OT Frequency:  Min 3X/week     Co-evaluation              AM-PAC OT 6 Clicks Daily Activity     Outcome Measure Help from another person eating meals?: None Help from another person taking care of personal grooming?: None Help from another person toileting, which includes using toliet, bedpan, or urinal?: A Lot Help from another person bathing (including washing, rinsing, drying)?: A Lot Help from another person to put on and taking off regular upper body clothing?: A Little Help from another person to put on and taking off regular lower body clothing?: A Lot 6 Click Score: 17   End of Session Equipment Utilized During Treatment: Gait belt;Rolling walker (2 wheels);Back brace Nurse Communication: Mobility status;Precautions  Activity Tolerance: Patient tolerated treatment well Patient left: in chair;with call bell/phone within reach;with chair alarm set;with family/visitor present  OT Visit Diagnosis: Unsteadiness on feet (R26.81);Muscle weakness (generalized) (M62.81)                Time: 8954-8874 OT Time Calculation (min): 40 min Charges:  OT General Charges $OT Visit: 1 Visit OT Evaluation $OT Re-eval: 1 Re-eval OT Treatments $Self Care/Home Management : 23-37 mins  Anyelo Mccue K, OTD, OTR/L SecureChat Preferred Acute Rehab [phone]   Laneta MARLA Pereyra 05/27/2024, 3:38 PM

## 2024-05-27 NOTE — Progress Notes (Signed)
    Providing Compassionate, Quality Care - Together   NEUROSURGERY PROGRESS NOTE     S: Still in considerable pain. Rested well overnight.    O: EXAM:  BP 134/63 (BP Location: Left Arm)   Pulse 81   Temp 98.3 F (36.8 C) (Oral)   Resp 14   Ht 5' 7 (1.702 m)   Wt 72.6 kg   SpO2 97%   BMI 25.06 kg/m     Awake, alert, oriented  Speech fluent, appropriate  MAEs JP in place   ASSESSMENT:  68 y.o. s/p staged L5-S1 ALIF, L1-2, L2-3, L3-4 XLIF and posterior spinal fusion/instrumentation T11-ilium    PLAN: -Continue multimodal pain control -Continue PCA -Continue therapies as tolerated -Continue JP, likely dc tmrw.  -TLSO when OOB -Encourage oral rehydration and sodium intake.  -Call w/ questions/concerns.   Camie Pickle, [facility]

## 2024-05-27 NOTE — Plan of Care (Signed)

## 2024-05-27 NOTE — Progress Notes (Signed)
 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  Goals, as originally set, remain appropriate. Anticipate pt will make steady progress, especially once pain control is improved.      If plan is discharge home, recommend the following: A little help with walking and/or transfers;A little help with bathing/dressing/bathroom;Assistance with cooking/housework;Assist for transportation;Help with stairs or ramp for entrance   Can travel by private vehicle        Equipment Recommendations  Rolling walker (2 wheels)    Recommendations for Other Services       Precautions / Restrictions Precautions Precautions: Back;Fall Precaution/Restrictions Comments: back precautions Required Braces or Orthoses: Spinal Brace Spinal Brace: Thoracolumbosacral orthotic;Applied in sitting position Restrictions Weight Bearing Restrictions Per Provider Order: No     Mobility  Bed Mobility Overal bed mobility: Needs Assistance Bed Mobility: Sit to Sidelying         Sit to sidelying: Min assist (Assist need for LEs.) General bed mobility comments: Pt up in chair upon PT arrival    Transfers Overall transfer level: Needs assistance Equipment used: Rolling walker (2 wheels) Transfers: Sit to/from Stand Sit to Stand: Min assist           General transfer comment: Cues for hand placement    Ambulation/Gait Ambulation/Gait assistance: Contact guard assist Gait Distance (Feet): 14 Feet Assistive device: Rolling walker (2 wheels) Gait Pattern/deviations: Step-through pattern, Decreased stride length Gait velocity: decreased     General Gait Details:  no balance losses, gait slow and steady   Stairs             Wheelchair Mobility     Tilt Bed    Modified Rankin (Stroke Patients Only)       Balance Overall balance assessment: Needs assistance         Standing balance support: Bilateral upper extremity supported, During functional activity, Reliant on assistive device for balance Standing balance-Leahy Scale: Poor Standing balance comment: uses RW for balance                            Communication Communication Communication: No apparent difficulties  Cognition Arousal: Alert Behavior During Therapy: WFL for tasks assessed/performed   PT - Cognitive impairments: No apparent impairments                         Following commands: Intact      Cueing Cueing Techniques: Verbal cues  Exercises      General Comments General comments (skin integrity, edema, etc.): husband present throughout treatment session      Pertinent Vitals/Pain Pain Assessment Pain Assessment: 0-10 Pain Score: 10-Worst pain ever Pain Location: L hip and back Pain Descriptors / Indicators: Constant, Discomfort, Grimacing Pain Intervention(s): Limited activity within patient's tolerance, Monitored during session    Home Living                          Prior Function            PT Goals (current goals can now be found in the care plan section) Progress towards PT goals:  Progressing toward goals    Frequency    Min 5X/week      PT Plan      Co-evaluation              AM-PAC PT 6 Clicks Mobility   Outcome Measure  Help needed turning from your back to your side while in a flat bed without using bedrails?: A Little Help needed moving from lying on your back to sitting on the side of a flat bed without using bedrails?: A Little Help needed moving to and from a bed to a chair (including a wheelchair)?: A Little Help needed standing up from a chair using your arms (e.g.,  wheelchair or bedside chair)?: A Little Help needed to walk in hospital room?: A Lot Help needed climbing 3-5 steps with a railing? : A Lot 6 Click Score: 16    End of Session Equipment Utilized During Treatment: Back brace;Gait belt Activity Tolerance: Patient tolerated treatment well Patient left: in bed;with call bell/phone within reach;with bed alarm set;with family/visitor present Nurse Communication: Mobility status PT Visit Diagnosis: Unsteadiness on feet (R26.81)     Time: 8798-8774 PT Time Calculation (min) (ACUTE ONLY): 24 min  Charges:    $Gait Training: 23-37 mins PT General Charges $$ ACUTE PT VISIT: 1 Visit                     Oneil Schick, PT   Acute Rehabilitation Services  Office [phone] 05/27/2024    Schick Oneil PARAS 05/27/2024, 2:52 PM

## 2024-05-28 LAB — CBC
HCT: 19.9 % — ABNORMAL LOW (ref 36.0–46.0)
Hemoglobin: 7 g/dL — ABNORMAL LOW (ref 12.0–15.0)
MCH: 33.5 pg (ref 26.0–34.0)
MCHC: 35.2 g/dL (ref 30.0–36.0)
MCV: 95.2 fL (ref 80.0–100.0)
Platelets: 204 K/uL (ref 150–400)
RBC: 2.09 MIL/uL — ABNORMAL LOW (ref 3.87–5.11)
RDW: 12.7 % (ref 11.5–15.5)
WBC: 11.7 K/uL — ABNORMAL HIGH (ref 4.0–10.5)
nRBC: 0 % (ref 0.0–0.2)

## 2024-05-28 LAB — BASIC METABOLIC PANEL WITH GFR
Anion gap: 6 (ref 5–15)
Anion gap: 9 (ref 5–15)
BUN: 10 mg/dL (ref 8–23)
BUN: 9 mg/dL (ref 8–23)
CO2: 22 mmol/L (ref 22–32)
CO2: 22 mmol/L (ref 22–32)
Calcium: 7.5 mg/dL — ABNORMAL LOW (ref 8.9–10.3)
Calcium: 7.6 mg/dL — ABNORMAL LOW (ref 8.9–10.3)
Chloride: 99 mmol/L (ref 98–111)
Chloride: 96 mmol/L — ABNORMAL LOW (ref 98–111)
Creatinine, Ser: 0.57 mg/dL (ref 0.44–1.00)
Creatinine, Ser: 0.64 mg/dL (ref 0.44–1.00)
GFR, Estimated: >60 mL/min (ref >60)
GFR, Estimated: >60 mL/min (ref >60)
Glucose, Bld: 95 mg/dL (ref 70–99)
Glucose, Bld: 89 mg/dL (ref 70–99)
Potassium: 3.8 mmol/L (ref 3.5–5.1)
Potassium: 3.8 mmol/L (ref 3.5–5.1)
Sodium: 127 mmol/L — ABNORMAL LOW (ref 135–145)
Sodium: 127 mmol/L — ABNORMAL LOW (ref 135–145)

## 2024-05-28 LAB — URINALYSIS, ROUTINE W REFLEX MICROSCOPIC
Bilirubin Urine: NEGATIVE
Glucose, UA: NEGATIVE mg/dL
Ketones, ur: 5 mg/dL — AB
Leukocytes,Ua: NEGATIVE
Nitrite: NEGATIVE
Protein, ur: NEGATIVE mg/dL
Specific Gravity, Urine: 1.012 (ref 1.005–1.030)
pH: 6 (ref 5.0–8.0)

## 2024-05-28 MED ORDER — POLYETHYLENE GLYCOL 3350 17 G PO PACK
17.0000 g | PACK | Freq: Two times a day (BID) | ORAL | Status: DC | PRN
  Administered 2024-05-28 – 2024-05-29 (×2): 17 g via ORAL
  Filled 2024-05-28 (×3): qty 1

## 2024-05-28 MED ORDER — SODIUM CHLORIDE 1 G PO TABS
1.0000 g | ORAL_TABLET | Freq: Two times a day (BID) | ORAL | Status: DC
  Administered 2024-05-28 – 2024-05-31 (×6): 1 g via ORAL
  Filled 2024-05-28 (×6): qty 1

## 2024-05-28 MED ORDER — HYDROMORPHONE HCL 1 MG/ML IJ SOLN
0.5000 mg | INTRAMUSCULAR | Status: DC | PRN
  Administered 2024-05-28 – 2024-05-31 (×3): 0.5 mg via INTRAVENOUS
  Filled 2024-05-28 (×3): qty 0.5

## 2024-05-28 MED ORDER — CHLORHEXIDINE GLUCONATE CLOTH 2 % EX PADS
6.0000 | MEDICATED_PAD | Freq: Every day | CUTANEOUS | Status: DC
  Administered 2024-05-28 – 2024-05-31 (×4): 6 via TOPICAL

## 2024-05-28 MED ORDER — OXYCODONE HCL 5 MG PO TABS
5.0000 mg | ORAL_TABLET | ORAL | Status: DC | PRN
  Administered 2024-05-28 – 2024-05-31 (×13): 10 mg via ORAL
  Filled 2024-05-28 (×13): qty 2

## 2024-05-28 NOTE — Progress Notes (Signed)
 Physical Therapy Treatment Patient Details Name: Kathryn [name] MRN: [id number] DOB: 20-May-1956 Today's Date: 05/28/2024   History of Present Illness 68 yo female  s/p staged L5-S1 ALIF, L1-2, L2-3, L3-4 XLIF and posterior spinal fusion/instrumentation.T11-ilium1 PMH anxiety, arthritis, asthma, Guillian Barre syndrome ( 1970s) hernia, IBS    PT Comments  Patient met in supine, agreeable to participate in mobility despite reports of continued pain. Patient laying in flat position with LE elevated. Reported rarely changing position in bed. Education provided to patient on importance of frequent change in position for pain management and progression toward mobility goals. Currently completes bed mobility with minA. Sit to stand transfers progressed from minA to CGA. In standing position, patient reported progressive R lateral thigh/hip pain that significantly limited overall tolerance. Able to participate in two gait trials, 8 feet x1 and 20 feet x2 with 2WW and CGA. Patient seated upright in chair end of session, unable to tolerate LE elevated. Encouraged to participate in sitting for at least 1 hour before calling for assist back to bed. RN notified. Patient will continue to benefit from skilled acute PT services in order to progress to discharge home.  Continues to be limited by poor pain tolerance.   If plan is discharge home, recommend the following: A little help with walking and/or transfers;A little help with bathing/dressing/bathroom;Assistance with cooking/housework;Assist for transportation;Help with stairs or ramp for entrance   Can travel by private vehicle        Equipment Recommendations  Rolling walker (2 wheels)    Recommendations for Other Services       Precautions / Restrictions Precautions Precautions: Back Recall of Precautions/Restrictions: Intact Precaution/Restrictions Comments: spine precautions Required Braces or Orthoses: Spinal Brace Spinal Brace:  Thoracolumbosacral orthotic;Applied in sitting position Restrictions Weight Bearing Restrictions Per Provider Order: No     Mobility  Bed Mobility Overal bed mobility: Needs Assistance Bed Mobility: Rolling, Sidelying to Sit Rolling: Contact guard assist Sidelying to sit: Min assist       General bed mobility comments: Patient benefits from cues for sequencing via log roll, minA for trunk righting.    Transfers Overall transfer level: Needs assistance Equipment used: Rolling walker (2 wheels) Transfers: Sit to/from Stand Sit to Stand: Contact guard assist, Min assist           General transfer comment: Initial stand with minA, progressing to CGA. Completed x 4 transfers.    Ambulation/Gait Ambulation/Gait assistance: Contact guard assist Gait Distance (Feet):  (8 feet x1 and 20 feet x1) Assistive device: Rolling walker (2 wheels) Gait Pattern/deviations: Step-through pattern, Decreased stride length Gait velocity: decreased     General Gait Details: Reduced step length bilaterally, slightly forward flexed posture, no buckling noted.   Stairs             Wheelchair Mobility     Tilt Bed    Modified Rankin (Stroke Patients Only)       Balance Overall balance assessment: Needs assistance Sitting-balance support: Bilateral upper extremity supported, Feet supported Sitting balance-Leahy Scale: Fair     Standing balance support: Bilateral upper extremity supported, During functional activity, Reliant on assistive device for balance Standing balance-Leahy Scale: Poor Standing balance comment: Uses support of 2WW for standing activity due to progressive pain.                            Communication Communication Communication: No apparent difficulties  Cognition Arousal: Alert Behavior During Therapy: [facility] for  tasks assessed/performed   PT - Cognitive impairments: No apparent impairments                         Following  commands: Intact      Cueing Cueing Techniques: Verbal cues, Tactile cues  Exercises      General Comments General comments (skin integrity, edema, etc.): Education regarding importance of mobility/exercise and frequent change of position to assist with pain management. Education for donning TLSO, progressing time spent upright in chair, calling for assistance with mobility.      Pertinent Vitals/Pain Pain Assessment Pain Assessment: 0-10 Pain Score: 9  Pain Location: R lateral hip/thigh Pain Descriptors / Indicators: Discomfort, Sore Pain Intervention(s): Limited activity within patient's tolerance, Monitored during session, Premedicated before session    Home Living                          Prior Function            PT Goals (current goals can now be found in the care plan section) Progress towards PT goals: Progressing toward goals    Frequency    Min 5X/week      PT Plan      Co-evaluation              AM-PAC PT 6 Clicks Mobility   Outcome Measure  Help needed turning from your back to your side while in a flat bed without using bedrails?: A Little Help needed moving from lying on your back to sitting on the side of a flat bed without using bedrails?: A Little Help needed moving to and from a bed to a chair (including a wheelchair)?: A Little Help needed standing up from a chair using your arms (e.g., wheelchair or bedside chair)?: A Little Help needed to walk in hospital room?: A Little Help needed climbing 3-5 steps with a railing? : Total 6 Click Score: 16    End of Session Equipment Utilized During Treatment: Back brace;Gait belt Activity Tolerance: Patient tolerated treatment well Patient left: with call bell/phone within reach;with family/visitor present;with chair alarm set Nurse Communication: Mobility status PT Visit Diagnosis: Unsteadiness on feet (R26.81)     Time: 8678-8653 PT Time Calculation (min) (ACUTE ONLY): 25  min  Charges:    $Gait Training: 8-22 mins $Therapeutic Activity: 8-22 mins PT General Charges $$ ACUTE PT VISIT: 1 Visit                     [name], PT, DPT [facility] Acute Rehabilitation Office: [phone]    [name] 05/28/2024, 3:42 PM

## 2024-05-28 NOTE — Progress Notes (Signed)
    Providing Compassionate, Quality Care - Together   NEUROSURGERY PROGRESS NOTE     S: Urinary retention o/n. No other new complaints. Pain mildly improved, was able to work w/ therapies yesterday.    O: EXAM:  BP 127/61 (BP Location: Left Arm)   Pulse 72   Temp 97.8 F (36.6 C)   Resp 14   Ht 5' 7 (1.702 m)   Wt 72.6 kg   SpO2 99%   BMI 25.06 kg/m     Awake, alert, oriented  Speech fluent, appropriate  5/5 BUE/BLE Dressing c/d/i JP in place   ASSESSMENT:  68 y.o. s/p staged L5-S1 ALIF, L1-2, L2-3, L3-4 XLIF and posterior spinal fusion/instrumentation T11-ilium     PLAN: -Continue multimodal pain control -Discontinue PCA - transition to PO analgesics -Continue therapies as tolerated -Discontinue JP drain.  -TLSO when OOB -Encourage oral rehydration and sodium intake. Sodium tabs added -Insert foley catheter given urinary retention despite I&O cath.  -Call w/ questions/concerns.   Camie Pickle, [facility]

## 2024-05-28 NOTE — Progress Notes (Signed)
 Pt stating she is seeing things on the wall.  Pt is alert and oriented, but seems to forget she in the hospital.  Pt did say she knew she came in for back surgery.  Notified Md.  See new orders.

## 2024-05-28 NOTE — Plan of Care (Signed)
 Pt has little appetite. Pleasantly confused since this afternoon.   Md aware.  Pt c/o of pain 7/10.  Pt states feels like something is in my back.  JP drain removed today.   Problem: Education: Goal: Knowledge of General Education information will improve Description: Including pain rating scale, medication(s)/side effects and non-pharmacologic comfort measures Outcome: Progressing   Problem: Health Behavior/Discharge Planning: Goal: Ability to manage health-related needs will improve Outcome: Progressing   Problem: Clinical Measurements: Goal: Ability to maintain clinical measurements within normal limits will improve Outcome: Progressing   Problem: Activity: Goal: Risk for activity intolerance will decrease Outcome: Progressing   Problem: Coping: Goal: Level of anxiety will decrease Outcome: Progressing

## 2024-05-29 LAB — CBC
HCT: 20 % — ABNORMAL LOW (ref 36.0–46.0)
Hemoglobin: 6.7 g/dL — CL (ref 12.0–15.0)
MCH: 32.7 pg (ref 26.0–34.0)
MCHC: 33.5 g/dL (ref 30.0–36.0)
MCV: 97.6 fL (ref 80.0–100.0)
Platelets: 223 K/uL (ref 150–400)
RBC: 2.05 MIL/uL — ABNORMAL LOW (ref 3.87–5.11)
RDW: 12.6 % (ref 11.5–15.5)
WBC: 8.8 K/uL (ref 4.0–10.5)
nRBC: 0 % (ref 0.0–0.2)

## 2024-05-29 LAB — PREPARE RBC (CROSSMATCH)

## 2024-05-29 LAB — BASIC METABOLIC PANEL WITH GFR
Anion gap: 9 (ref 5–15)
BUN: 8 mg/dL (ref 8–23)
CO2: 20 mmol/L — ABNORMAL LOW (ref 22–32)
Calcium: 7.8 mg/dL — ABNORMAL LOW (ref 8.9–10.3)
Chloride: 100 mmol/L (ref 98–111)
Creatinine, Ser: 0.66 mg/dL (ref 0.44–1.00)
GFR, Estimated: >60 mL/min (ref >60)
Glucose, Bld: 85 mg/dL (ref 70–99)
Potassium: 3.7 mmol/L (ref 3.5–5.1)
Sodium: 129 mmol/L — ABNORMAL LOW (ref 135–145)

## 2024-05-29 LAB — TYPE AND SCREEN
ABO/RH(D): AB NEG
Antibody Screen: NEGATIVE

## 2024-05-29 MED ORDER — ACETAMINOPHEN 325 MG PO TABS
650.0000 mg | ORAL_TABLET | Freq: Once | ORAL | Status: AC
  Administered 2024-05-29: 650 mg via ORAL
  Filled 2024-05-29: qty 2

## 2024-05-29 MED ORDER — SODIUM CHLORIDE 0.9% IV SOLUTION: Freq: Once | INTRAVENOUS | Status: AC

## 2024-05-29 MED ORDER — DIPHENHYDRAMINE HCL 25 MG PO CAPS
25.0000 mg | ORAL_CAPSULE | Freq: Once | ORAL | Status: AC
Start: 2024-05-29 — End: 2024-05-29
  Administered 2024-05-29: 25 mg via ORAL
  Filled 2024-05-29: qty 1

## 2024-05-29 NOTE — Progress Notes (Signed)
 PT Cancellation Note  Patient Details Name: Kathryn [name] MRN: [id number] DOB: 09-12-1955   Cancelled Treatment:    Reason Eval/Treat Not Completed: Medical issues which prohibited therapy. RN reporting pt with symptomatic anemia, preparing to receive 2 units of blood. PT will follow up tomorrow, after transfusion is complete.   Bernardino JINNY Ruth 05/29/2024, 3:07 PM

## 2024-05-29 NOTE — Progress Notes (Signed)
 OT entering room at request of RN to educate regarding brace application for RN education, fit and wear schedule. Will continue to follow per POC per OT treatment session note this morning.     05/29/24 1600  OT Visit Information  Last OT Received On 05/29/24  Assistance Needed +1  History of Present Illness 68 yo female  s/p staged L5-S1 ALIF, L1-2, L2-3, L3-4 XLIF and posterior spinal fusion/instrumentation.T11-ilium1 PMH anxiety, arthritis, asthma, Guillian Barre syndrome ( 1970s) hernia, IBS  Precautions  Precautions Back  Precaution Booklet Issued Yes (comment)  Recall of Precautions/Restrictions Intact  Precaution/Restrictions Comments spine precautions  Required Braces or Orthoses Spinal Brace  Spinal Brace TLSO;Applied in sitting position  Restrictions  Weight Bearing Restrictions Per Provider Order No  Pain Assessment  Pain Assessment Faces  Faces Pain Scale 4  Pain Location R lateral hip/thigh  Pain Descriptors / Indicators Discomfort;Sore  Pain Intervention(s) Limited activity within patient's tolerance  Cognition  Arousal Alert  Behavior During Therapy [facility] for tasks assessed/performed  Cognition Cognition impaired  Awareness Online awareness impaired  Memory impairment (select all impairments) Short-term memory  Following Commands  Following commands Intact  Cueing  Cueing Techniques Verbal cues;Tactile cues  Communication  Communication No apparent difficulties  Upper Extremity Assessment  Upper Extremity Assessment Generalized weakness  Lower Extremity Assessment  Lower Extremity Assessment Defer to PT evaluation  Vision- Assessment  Vision Assessment? No apparent visual deficits;Wears glasses for reading  ADL  Overall ADL's  Needs assistance/impaired  Upper Body Dressing  Moderate assistance;Sitting  Upper Body Dressing Details (indicate cue type and reason) Don back brace  Bed Mobility  Overal bed mobility Needs Assistance  Bed Mobility Rolling;Sidelying  to Sit  Rolling Supervision  Sidelying to sit Min assist  Transfers  Overall transfer level Needs assistance  Equipment used Rolling walker (2 wheels)  Transfers Sit to/from Stand  Sit to Stand Contact guard assist;Min assist  Balance  Overall balance assessment Needs assistance  Sitting-balance support Bilateral upper extremity supported;Feet supported  Sitting balance-Leahy Scale Fair  Standing balance support Bilateral upper extremity supported;During functional activity;Reliant on assistive device for balance  Standing balance-Leahy Scale Poor  OT - End of Session  Equipment Utilized During Treatment Gait belt;Rolling walker (2 wheels);Back brace  Activity Tolerance Patient tolerated treatment well  Patient left  (up with RN)  Nurse Communication Mobility status;Precautions  OT Assessment/Plan  OT Visit Diagnosis Unsteadiness on feet (R26.81);Muscle weakness (generalized) (M62.81)  OT Frequency (ACUTE ONLY) Min 3X/week  Recommendations for Other Services PT consult  Follow Up Recommendations Home health OT  Patient can return home with the following A lot of help with walking and/or transfers;A lot of help with bathing/dressing/bathroom  OT Equipment Other (comment) (RW)  AM-PAC OT 6 Clicks Daily Activity Outcome Measure (Version 2)  Help from another person eating meals? 4  Help from another person taking care of personal grooming? 4  Help from another person toileting, which includes using toliet, bedpan, or urinal? 2  Help from another person bathing (including washing, rinsing, drying)? 2  Help from another person to put on and taking off regular upper body clothing? 3  Help from another person to put on and taking off regular lower body clothing? 2  6 Click Score 17  Progressive Mobility  What is the highest level of mobility based on the mobility assessment? Level 4 (Ambulates with assistance) - Balance while stepping forward/back - Complete  Mobility Referral Yes   Activity Ambulated independently  OT Goal  Progression  Progress towards OT goals Progressing toward goals  Acute Rehab OT Goals  OT Goal Formulation With patient  Time For Goal Achievement 06/06/24  Potential to Achieve Goals Good  ADL Goals  Pt Will Perform Upper Body Bathing with modified independence;sitting  Pt Will Perform Lower Body Bathing with modified independence;sit to/from stand  Pt Will Perform Upper Body Dressing with modified independence;sitting  Pt Will Perform Lower Body Dressing with modified independence;with adaptive equipment;sit to/from stand  Pt Will Transfer to Toilet with modified independence;ambulating;bedside commode  Additional ADL Goal #1 pt will demonstrate back precautions with 100% accuracy  OT Time Calculation  OT Start Time (ACUTE ONLY) 1237 (1237)  OT Stop Time (ACUTE ONLY) 1243  OT Time Calculation (min) 6 min  OT General Charges  $OT Visit 1 Visit

## 2024-05-29 NOTE — Progress Notes (Signed)
 Occupational Therapy Treatment Patient Details Name: Kathryn [name] MRN: [id number] DOB: 1955/10/14 Today's Date: 05/29/2024   History of present illness 68 yo female  s/p staged L5-S1 ALIF, L1-2, L2-3, L3-4 XLIF and posterior spinal fusion/instrumentation.T11-ilium1 PMH anxiety, arthritis, asthma, Guillian Barre syndrome ( 1970s) hernia, IBS   OT comments  Pt progressing toward goals this session, pt reports RLE pain. Pt needs up to mod A to don back brace at EOB, CGA for bed mobility and CGA for transfers with RW. No BLE buckling noted this session, but pt still reports legs feeling weak. Pt needs mod cues for back precautions, only recalls 1/3. Reiterated back precautions and brace wear schedule. Pt presenting with impairments listed below, will follow acutely. Continue to recommend HHOT at d/c.       If plan is discharge home, recommend the following:  A lot of help with walking and/or transfers;A lot of help with bathing/dressing/bathroom   Equipment Recommendations  Other (comment) (RW)    Recommendations for Other Services PT consult    Precautions / Restrictions Precautions Precautions: Back Precaution Booklet Issued: Yes (comment) Recall of Precautions/Restrictions: Intact Precaution/Restrictions Comments: spine precautions Required Braces or Orthoses: Spinal Brace Spinal Brace: Thoracolumbosacral orthotic;Applied in sitting position Restrictions Weight Bearing Restrictions Per Provider Order: No       Mobility Bed Mobility Overal bed mobility: Needs Assistance Bed Mobility: Rolling, Sidelying to Sit Rolling: Supervision Sidelying to sit: Min assist            Transfers Overall transfer level: Needs assistance Equipment used: Rolling walker (2 wheels) Transfers: Sit to/from Stand Sit to Stand: Contact guard assist, Min assist                 Balance Overall balance assessment: Needs assistance Sitting-balance support: Bilateral upper extremity  supported, Feet supported Sitting balance-Leahy Scale: Fair Sitting balance - Comments: needs BUE support to offload back pain   Standing balance support: Bilateral upper extremity supported, During functional activity, Reliant on assistive device for balance Standing balance-Leahy Scale: Poor Standing balance comment: RW for ambulation                           ADL either performed or assessed with clinical judgement   ADL Overall ADL's : Needs assistance/impaired     Grooming: Oral care;Set up;Standing           Upper Body Dressing : Moderate assistance;Sitting Upper Body Dressing Details (indicate cue type and reason): Don back brace     Toilet Transfer: Supervision/safety;Rolling walker (2 wheels)           Functional mobility during ADLs: Supervision/safety;Rolling walker (2 wheels)      Extremity/Trunk Assessment Upper Extremity Assessment Upper Extremity Assessment: Generalized weakness   Lower Extremity Assessment Lower Extremity Assessment: Defer to PT evaluation        Vision   Vision Assessment?: No apparent visual deficits;Wears glasses for reading   Perception Perception Perception: Not tested   Praxis Praxis Praxis: Not tested   Communication Communication Communication: No apparent difficulties   Cognition Arousal: Alert Behavior During Therapy: WFL for tasks assessed/performed Cognition: Cognition impaired     Awareness: Online awareness impaired Memory impairment (select all impairments): Short-term memory                       Following commands: Intact        Cueing   Cueing Techniques: Verbal cues, Tactile cues  Exercises      Shoulder Instructions       General Comments VSS    Pertinent Vitals/ Pain       Pain Assessment Pain Assessment: Faces Pain Score: 4  Faces Pain Scale: Hurts little more Pain Location: R lateral hip/thigh Pain Descriptors / Indicators: Discomfort, Sore Pain  Intervention(s): Limited activity within patient's tolerance, Monitored during session, Repositioned, Premedicated before session  Home Living                                          Prior Functioning/Environment              Frequency  Min 3X/week        Progress Toward Goals  OT Goals(current goals can now be found in the care plan section)  Progress towards OT goals: Progressing toward goals  Acute Rehab OT Goals Patient Stated Goal: none stated OT Goal Formulation: With patient Time For Goal Achievement: 06/06/24 Potential to Achieve Goals: Good ADL Goals Pt Will Perform Upper Body Bathing: with modified independence;sitting Pt Will Perform Lower Body Bathing: with modified independence;sit to/from stand Pt Will Perform Upper Body Dressing: with modified independence;sitting Pt Will Perform Lower Body Dressing: with modified independence;with adaptive equipment;sit to/from stand Pt Will Transfer to Toilet: with modified independence;ambulating;bedside commode Additional ADL Goal #1: pt will demonstrate back precautions with 100% accuracy  Plan      Co-evaluation                 AM-PAC OT 6 Clicks Daily Activity     Outcome Measure   Help from another person eating meals?: None Help from another person taking care of personal grooming?: None Help from another person toileting, which includes using toliet, bedpan, or urinal?: A Lot Help from another person bathing (including washing, rinsing, drying)?: A Lot Help from another person to put on and taking off regular upper body clothing?: A Little Help from another person to put on and taking off regular lower body clothing?: A Lot 6 Click Score: 17    End of Session Equipment Utilized During Treatment: Gait belt;Rolling walker (2 wheels);Back brace  OT Visit Diagnosis: Unsteadiness on feet (R26.81);Muscle weakness (generalized) (M62.81)   Activity Tolerance Patient tolerated  treatment well   Patient Left in chair;with call bell/phone within reach;with chair alarm set;with family/visitor present   Nurse Communication Mobility status;Precautions        Time: 9172-9152 OT Time Calculation (min): 20 min  Charges: OT General Charges $OT Visit: 1 Visit OT Treatments $Self Care/Home Management : 8-22 mins  Sonali Wivell K, OTD, OTR/L SecureChat Preferred Acute Rehab [phone]   Laneta POUR Koonce 05/29/2024, 10:28 AM

## 2024-05-29 NOTE — Progress Notes (Signed)
 NAEs o/n.  Had foley placed yesterday. Reports she feels more comfortable today.  A febrile, vital sign stable. Incision dressings, clean, dry, intact. Full strength, lower extremities. Sitting in chair with TLSO brace  Status post L1 to S1 interbody fusion and T11-ilium posterior fusion doing well - will plan for fully removal tomorrow for void trial - Continue mobilization

## 2024-05-29 NOTE — Plan of Care (Signed)

## 2024-05-30 LAB — TYPE AND SCREEN
ABO/RH(D): AB NEG
Antibody Screen: NEGATIVE
Unit division: 0
Unit division: 0

## 2024-05-30 LAB — BPAM RBC
Blood Product Expiration Date: 202510092359
Blood Product Expiration Date: 202510192359
ISSUE DATE / TIME: 202509221828
ISSUE DATE / TIME: 202509222312
Unit Type and Rh: 600
Unit Type and Rh: 600

## 2024-05-30 LAB — HEMOGLOBIN AND HEMATOCRIT, BLOOD
HCT: 34.4 % — ABNORMAL LOW (ref 36.0–46.0)
Hemoglobin: 12 g/dL (ref 12.0–15.0)

## 2024-05-30 NOTE — Progress Notes (Signed)
 Physical Therapy Treatment Patient Details Name: Kathryn [name] MRN: [id number] DOB: [birth year] Today's Date: 05/30/2024   History of Present Illness 68 yo female  s/p staged L5-S1 ALIF, L1-2, L2-3, L3-4 XLIF and posterior spinal fusion/instrumentation.T11-ilium1 PMH anxiety, arthritis, asthma, Guillian Barre syndrome ( 1970s) hernia, IBS    PT Comments  Pt up in chair on arrival, pleasant and eager for mobility and demonstrating good progress towards acute goals. Pt demonstrating transfers and gait with RW for support with grossly CGA for safety with no LOB noted. Pt able to recall 3/3 precautions, however continues to require light cues throughout mobility for adherence. Continued education on precautions, brace application/wearing schedule, appropriate activity progression, and car transfer, with pt verbalizing understanding.  Pt continues to benefit from skilled PT services to progress toward functional mobility goals.     If plan is discharge home, recommend the following: A little help with walking and/or transfers;A little help with bathing/dressing/bathroom;Assistance with cooking/housework;Assist for transportation;Help with stairs or ramp for entrance   Can travel by private vehicle        Equipment Recommendations  Rolling walker (2 wheels)    Recommendations for Other Services       Precautions / Restrictions Precautions Precautions: Back Precaution Booklet Issued: Yes (comment) Recall of Precautions/Restrictions: Intact Precaution/Restrictions Comments: spine precautions Required Braces or Orthoses: Spinal Brace Spinal Brace: Thoracolumbosacral orthotic;Other (comment) (donned on arrvial) Restrictions Weight Bearing Restrictions Per Provider Order: No     Mobility  Bed Mobility Overal bed mobility: Needs Assistance             General bed mobility comments: pt up in chair on arrival    Transfers Overall transfer level: Needs assistance Equipment used:  Rolling walker (2 wheels) Transfers: Sit to/from Stand Sit to Stand: Contact guard assist           General transfer comment: CGA for safety, light cues for hand placement    Ambulation/Gait Ambulation/Gait assistance: Contact guard assist Gait Distance (Feet): 150 Feet Assistive device: Rolling walker (2 wheels) Gait Pattern/deviations: Step-through pattern, Decreased stride length Gait velocity: decreased     General Gait Details: steady with RW for support, no buckling noted   Stairs Stairs:  (verbally reviewed stair sequencing as pt stating her RLE feels slightly weaker, pt declining to physcially practice)           Wheelchair Mobility     Tilt Bed    Modified Rankin (Stroke Patients Only)       Balance Overall balance assessment: Needs assistance Sitting-balance support: Bilateral upper extremity supported, Feet supported Sitting balance-Leahy Scale: Fair Sitting balance - Comments: needs BUE support to offload back pain   Standing balance support: Bilateral upper extremity supported, During functional activity, Reliant on assistive device for balance Standing balance-Leahy Scale: Poor Standing balance comment: RW for ambulation                            Communication Communication Communication: No apparent difficulties  Cognition Arousal: Alert Behavior During Therapy: WFL for tasks assessed/performed   PT - Cognitive impairments: No apparent impairments                         Following commands: Intact      Cueing Cueing Techniques: Verbal cues, Tactile cues  Exercises      General Comments General comments (skin integrity, edema, etc.): VSS on RA, spouse present and supportive  Pertinent Vitals/Pain Pain Assessment Pain Assessment: Faces Faces Pain Scale: Hurts a little bit Pain Location: R lateral hip/thigh Pain Descriptors / Indicators: Discomfort, Sore Pain Intervention(s): Monitored during session,  Limited activity within patient's tolerance    Home Living                          Prior Function            PT Goals (current goals can now be found in the care plan section) Acute Rehab PT Goals Patient Stated Goal: less pain PT Goal Formulation: With patient Time For Goal Achievement: 06/06/24 Progress towards PT goals: Progressing toward goals    Frequency    Min 5X/week      PT Plan      Co-evaluation              AM-PAC PT 6 Clicks Mobility   Outcome Measure  Help needed turning from your back to your side while in a flat bed without using bedrails?: A Little Help needed moving from lying on your back to sitting on the side of a flat bed without using bedrails?: A Little Help needed moving to and from a bed to a chair (including a wheelchair)?: A Little Help needed standing up from a chair using your arms (e.g., wheelchair or bedside chair)?: A Little Help needed to walk in hospital room?: A Little Help needed climbing 3-5 steps with a railing? : A Lot 6 Click Score: 17    End of Session Equipment Utilized During Treatment: Gait belt Activity Tolerance: Patient tolerated treatment well Patient left: with call bell/phone within reach;with family/visitor present;in chair Nurse Communication: Mobility status PT Visit Diagnosis: Unsteadiness on feet (R26.81)     Time: 0946-1000 PT Time Calculation (min) (ACUTE ONLY): 14 min  Charges:    $Gait Training: 8-22 mins PT General Charges $$ ACUTE PT VISIT: 1 Visit                     Kathryn R. PTA Acute Rehabilitation Services Office: [phone]   Kathryn CHRISTELLA Boor 05/30/2024, 11:24 AM

## 2024-05-30 NOTE — Plan of Care (Signed)
  Problem: Activity: Goal: Risk for activity intolerance will decrease Outcome: Not Progressing   Problem: Coping: Goal: Level of anxiety will decrease Outcome: Not Progressing   Problem: Elimination: Goal: Will not experience complications related to bowel motility Outcome: Not Progressing   Problem: Pain Managment: Goal: General experience of comfort will improve and/or be controlled Outcome: Not Progressing   Problem: Safety: Goal: Ability to remain free from injury will improve Outcome: Not Progressing

## 2024-05-30 NOTE — Progress Notes (Signed)
 Neurosurgery  No cute events. No shortness of breath. Feel she's doing much better and is eager to go home.  No acute distress. Full strength in lower extremities Dressings, clean, dry, intact   S/p T 11 to pelvis fusion - dc foley, void trial -  cont pt/OT, possible dc tomorrow

## 2024-05-30 NOTE — Progress Notes (Signed)
    Providing Compassionate, Quality Care - Together   NEUROSURGERY PROGRESS NOTE     S: No issues overnight. Reports improved pain. Had BM this AM.    O: EXAM:  BP 129/64 (BP Location: Left Arm)   Pulse 78   Temp (!) 97.4 F (36.3 C) (Oral)   Resp 16   Ht 5' 7 (1.702 m)   Wt 72.6 kg   SpO2 97%   BMI 25.06 kg/m     Awake, alert, oriented  Speech fluent, appropriate  5/5 BUE/BLE Dressing c/d/i   ASSESSMENT:  68 y.o. s/p staged L5-S1 ALIF, L1-2, L2-3, L3-4 XLIF and posterior spinal fusion/instrumentation T11-ilium     PLAN: -Continue pain control -Continue therapies as tolerated -Voiding trial -Call w/ questions/concerns.   Camie Pickle, [facility]

## 2024-05-30 NOTE — Plan of Care (Signed)

## 2024-05-31 ENCOUNTER — Other Ambulatory Visit (HOSPITAL_COMMUNITY): Payer: Self-pay

## 2024-05-31 ENCOUNTER — Telehealth (HOSPITAL_COMMUNITY): Payer: Self-pay | Admitting: Pharmacy Technician

## 2024-05-31 MED ORDER — CYCLOBENZAPRINE HCL 10 MG PO TABS
10.0000 mg | ORAL_TABLET | Freq: Three times a day (TID) | ORAL | 2 refills | Status: AC | PRN
  Filled 2024-05-31: qty 90, 30d supply, fill #0

## 2024-05-31 MED ORDER — OXYCODONE HCL 5 MG PO TABS
5.0000 mg | ORAL_TABLET | Freq: Four times a day (QID) | ORAL | 0 refills | Status: AC | PRN
  Filled 2024-05-31: qty 50, 7d supply, fill #0

## 2024-05-31 MED ORDER — DOCUSATE SODIUM 100 MG PO CAPS
100.0000 mg | ORAL_CAPSULE | Freq: Two times a day (BID) | ORAL | 2 refills | Status: AC
  Filled 2024-05-31: qty 30, 15d supply, fill #0

## 2024-05-31 MED FILL — Sodium Chloride IV Soln 0.9%: INTRAVENOUS | Qty: 2000 | Status: AC

## 2024-05-31 MED FILL — Heparin Sodium (Porcine) Inj 1000 Unit/ML: INTRAMUSCULAR | Qty: 30 | Status: AC

## 2024-05-31 NOTE — Progress Notes (Signed)
 PIV removed. AVS reviewed. Walker delivered to bedside. TOC medications given to patient. Patient and spouse verbalize understanding of necessary precautions and follow up appointments.

## 2024-05-31 NOTE — TOC Transition Note (Signed)
 Transition of Care [facility]) - Discharge Note   Patient Details  Name: Kathryn [name] MRN: [id number] Date of Birth: 1956/07/30  Transition of Care [facility]) CM/SW Contact:  Rosalva Jon Bloch, RN Phone Number: 05/31/2024, 10:41 AM   Clinical Narrative:    Patient will DC un:ynfz Anticipated DC date: 05/31/2024 Family notified: yes Transport by: car   Per MD patient ready for DC today. RN, patient, patient's husband notified of DC. Pt agreeable to home health services. Pt without provider preference. Referral made with [facility] and accepted pending MD order and face to face. DME: RW will be delivered to bedside prior to d/c by [employer]. Pt without RX med concerns or transportation issues. Post hospital f/u noted on AVS. RNCM will sign off for now as intervention is no longer needed. Please consult us  again if new needs arise.    Final next level of care: Home w Home Health Services Barriers to Discharge: No Barriers Identified   Patient Goals and CMS Choice     Choice offered to / list presented to : Patient      Discharge Placement                       Discharge Plan and Services Additional resources added to the After Visit Summary for                  DME Arranged: Walker rolling DME Agency: [facility] Date DME Agency Contacted: 05/31/24 Time DME Agency Contacted: 1041 Representative spoke with at DME Agency: [facility] Arranged: PT, OT [facility] Agency: [facility] Date HH Agency Contacted: 05/31/24 Time HH Agency Contacted: 1026 Representative spoke with at [facility] Agency: [facility]  Social Drivers of Health (SDOH) Interventions SDOH Screenings   Food Insecurity: No Food Insecurity (05/24/2024)  Housing: Low Risk  (05/24/2024)  Transportation Needs: No Transportation Needs (05/24/2024)  Utilities: Not At Risk (05/24/2024)  Social Connections: Unknown (05/24/2024)  Tobacco Use: Medium Risk (05/25/2024)     Readmission Risk Interventions      No data to display

## 2024-05-31 NOTE — Progress Notes (Signed)
 Physical Therapy Treatment Patient Details Name: Kathryn [name] MRN: [id number] DOB: Dec 23, 1955 Today's Date: 05/31/2024   History of Present Illness 68 yo female  s/p staged L5-S1 ALIF, L1-2, L2-3, L3-4 XLIF and posterior spinal fusion/instrumentation.T11-ilium1 PMH anxiety, arthritis, asthma, Guillian Barre syndrome ( 1970s) hernia, IBS    PT Comments  Pt up in chair on arrival, pleasant and agreeable to session. Pt demonstrating continued progress towards acute goals, progressing activity tolerance with increased ambulation distance with RW for support and grossly CGA for safety. Pt without LE buckling during dynamic activities however pt with x1 R knee buckle in static standing at chair prior to ambulation. Discussed and educated pt on having an option to sit for static standing ADLS due to tendency for buckling due this time with pt verbalizing understanding. Continued education on precautions, brace application/wearing schedule, appropriate activity progression, and car transfer, with pt verbalizing understanding. Pt continues to benefit from skilled PT services to progress toward functional mobility goals.     If plan is discharge home, recommend the following: A little help with walking and/or transfers;A little help with bathing/dressing/bathroom;Assistance with cooking/housework;Assist for transportation;Help with stairs or ramp for entrance   Can travel by private vehicle        Equipment Recommendations  Rolling walker (2 wheels)    Recommendations for Other Services       Precautions / Restrictions Precautions Precautions: Back Precaution Booklet Issued: Yes (comment) Recall of Precautions/Restrictions: Intact Precaution/Restrictions Comments: spine precautions Required Braces or Orthoses: Spinal Brace Spinal Brace: Thoracolumbosacral orthotic Restrictions Weight Bearing Restrictions Per Provider Order: No     Mobility  Bed Mobility Overal bed mobility: Needs  Assistance Bed Mobility: Sit to Sidelying         Sit to sidelying: Min assist (Assist need for LEs.) General bed mobility comments: via log roll    Transfers Overall transfer level: Needs assistance Equipment used: Rolling walker (2 wheels) Transfers: Sit to/from Stand Sit to Stand: Supervision           General transfer comment: from recliner    Ambulation/Gait Ambulation/Gait assistance: Contact guard assist Gait Distance (Feet): 180 Feet Assistive device: Rolling walker (2 wheels) Gait Pattern/deviations: Step-through pattern, Decreased stride length Gait velocity: decreased     General Gait Details: steady with RW for support, no buckling noted   Stairs             Wheelchair Mobility     Tilt Bed    Modified Rankin (Stroke Patients Only)       Balance Overall balance assessment: Needs assistance Sitting-balance support: Bilateral upper extremity supported, Feet supported Sitting balance-Leahy Scale: Fair Sitting balance - Comments: needs BUE support to offload back pain   Standing balance support: Bilateral upper extremity supported, During functional activity, Reliant on assistive device for balance Standing balance-Leahy Scale: Poor Standing balance comment: RW for ambulation                            Communication Communication Communication: No apparent difficulties  Cognition Arousal: Alert Behavior During Therapy: WFL for tasks assessed/performed   PT - Cognitive impairments: No apparent impairments                         Following commands: Intact      Cueing Cueing Techniques: Verbal cues, Tactile cues  Exercises      General Comments General comments (skin integrity, edema, etc.): VSS  on RA      Pertinent Vitals/Pain Pain Assessment Pain Assessment: Faces Faces Pain Scale: Hurts little more Pain Location: R lateral hip/thigh Pain Descriptors / Indicators: Discomfort, Sore Pain  Intervention(s): Monitored during session, Limited activity within patient's tolerance    Home Living                          Prior Function            PT Goals (current goals can now be found in the care plan section) Acute Rehab PT Goals PT Goal Formulation: With patient Time For Goal Achievement: 06/06/24 Progress towards PT goals: Progressing toward goals    Frequency    Min 5X/week      PT Plan      Co-evaluation              AM-PAC PT 6 Clicks Mobility   Outcome Measure  Help needed turning from your back to your side while in a flat bed without using bedrails?: A Little Help needed moving from lying on your back to sitting on the side of a flat bed without using bedrails?: A Little Help needed moving to and from a bed to a chair (including a wheelchair)?: A Little Help needed standing up from a chair using your arms (e.g., wheelchair or bedside chair)?: A Little Help needed to walk in hospital room?: A Little Help needed climbing 3-5 steps with a railing? : A Lot 6 Click Score: 17    End of Session Equipment Utilized During Treatment: Back brace Activity Tolerance: Patient tolerated treatment well Patient left: with call bell/phone within reach;with family/visitor present;in chair Nurse Communication: Mobility status PT Visit Diagnosis: Unsteadiness on feet (R26.81)     Time: 9148-9095 PT Time Calculation (min) (ACUTE ONLY): 13 min  Charges:    $Gait Training: 8-22 mins PT General Charges $$ ACUTE PT VISIT: 1 Visit                     Therisa R. PTA Acute Rehabilitation Services Office: [phone]   Therisa CHRISTELLA Boor 05/31/2024, 12:07 PM

## 2024-05-31 NOTE — Telephone Encounter (Signed)
 Pharmacy Patient Advocate Encounter   Received notification from Inpatient Request that prior authorization for Cyclobenzaprine  10 mg Tablets is required/requested.   Insurance verification completed.   The patient is insured through [facility] ADVANTAGE/RX ADVANCE .   Per test claim: PA required; PA submitted to above mentioned insurance via Latent Key/confirmation #/EOC [id number] Status is pending

## 2024-05-31 NOTE — Plan of Care (Signed)

## 2024-05-31 NOTE — Telephone Encounter (Signed)
 Pharmacy Patient Advocate Encounter  Received notification from [facility] ADVANTAGE/RX ADVANCE that Prior Authorization for Cyclobenzaprine  HCl 10MG  tablets  has been APPROVED from 05/31/2024 to 06/28/2024   PA #/Case ID/Reference #: [id number]

## 2024-05-31 NOTE — Progress Notes (Signed)
 Occupational Therapy Treatment Patient Details Name: Kathryn [name] MRN: [id number] DOB: Oct 17, 1955 Today's Date: 05/31/2024   History of present illness 68 yo female  s/p staged L5-S1 ALIF, L1-2, L2-3, L3-4 XLIF and posterior spinal fusion/instrumentation.T11-ilium1 PMH anxiety, arthritis, asthma, Guillian Barre syndrome ( 1970s) hernia, IBS   OT comments  Pt progressing toward goals this session, noted mild RLE pain and instability but pt able to self correct during standing grooming task at sink. Pt educated on compensatory strategies for pericare and showed toileting aid/where to purchase/how it works. Reiterated back precautions and pt needed min cues to adhere to back precautions throughout session. Pt presenting with impairments listed below, will follow acutely. Continue to recommend HHOT at d/c.       If plan is discharge home, recommend the following:  A lot of help with walking and/or transfers;A lot of help with bathing/dressing/bathroom   Equipment Recommendations  Other (comment);BSC/3in1 (RW)    Recommendations for Other Services PT consult    Precautions / Restrictions Precautions Precautions: Back Precaution Booklet Issued: Yes (comment) Recall of Precautions/Restrictions: Intact Precaution/Restrictions Comments: spine precautions Required Braces or Orthoses: Spinal Brace Spinal Brace: Thoracolumbosacral orthotic Restrictions Weight Bearing Restrictions Per Provider Order: No       Mobility Bed Mobility Overal bed mobility: Needs Assistance Bed Mobility: Sidelying to Sit Rolling: Supervision         General bed mobility comments: via log roll    Transfers Overall transfer level: Needs assistance Equipment used: Rolling walker (2 wheels) Transfers: Sit to/from Stand Sit to Stand: Supervision                 Balance Overall balance assessment: Needs assistance Sitting-balance support: Bilateral upper extremity supported, Feet  supported Sitting balance-Leahy Scale: Fair     Standing balance support: Bilateral upper extremity supported, During functional activity, Reliant on assistive device for balance Standing balance-Leahy Scale: Poor Standing balance comment: RW for ambulation                           ADL either performed or assessed with clinical judgement   ADL Overall ADL's : Needs assistance/impaired     Grooming: Oral care;Standing;Contact guard assist Grooming Details (indicate cue type and reason): min cues for compensatory strategy with back precautions         Upper Body Dressing : Minimal assistance Upper Body Dressing Details (indicate cue type and reason): Don back brace     Toilet Transfer: Supervision/safety;Ambulation;Rolling walker (2 wheels);Regular Toilet   Toileting- Clothing Manipulation and Hygiene: Supervision/safety Toileting - Clothing Manipulation Details (indicate cue type and reason): incr time, education for compensatory strategy of leaning to side Tub/ Shower Transfer: Walk-in Clinical biochemist Details (indicate cue type and reason): simualted Functional mobility during ADLs: Supervision/safety;Rolling walker (2 wheels)      Extremity/Trunk Assessment Upper Extremity Assessment Upper Extremity Assessment: Generalized weakness   Lower Extremity Assessment Lower Extremity Assessment: Defer to PT evaluation        Vision   Vision Assessment?: No apparent visual deficits;Wears glasses for reading   Perception Perception Perception: Not tested   Praxis Praxis Praxis: Not tested   Communication Communication Communication: No apparent difficulties   Cognition Arousal: Alert Behavior During Therapy: WFL for tasks assessed/performed Cognition: Cognition impaired       Memory impairment (select all impairments): Short-term memory  Following commands: Intact        Cueing    Cueing Techniques: Verbal cues, Tactile cues  Exercises      Shoulder Instructions       General Comments VSS on RA    Pertinent Vitals/ Pain       Pain Assessment Pain Assessment: Faces Pain Score: 4  Faces Pain Scale: Hurts little more Pain Location: R lateral hip/thigh Pain Descriptors / Indicators: Discomfort, Sore Pain Intervention(s): Limited activity within patient's tolerance, Monitored during session, Repositioned  Home Living                                          Prior Functioning/Environment              Frequency  Min 3X/week        Progress Toward Goals  OT Goals(current goals can now be found in the care plan section)  Progress towards OT goals: Progressing toward goals  Acute Rehab OT Goals Patient Stated Goal: none stated OT Goal Formulation: With patient Time For Goal Achievement: 06/06/24 Potential to Achieve Goals: Good ADL Goals Pt Will Perform Upper Body Bathing: with modified independence;sitting Pt Will Perform Lower Body Bathing: with modified independence;sit to/from stand Pt Will Perform Upper Body Dressing: with modified independence;sitting Pt Will Perform Lower Body Dressing: with modified independence;with adaptive equipment;sit to/from stand Pt Will Transfer to Toilet: with modified independence;ambulating;bedside commode Additional ADL Goal #1: pt will demonstrate back precautions with 100% accuracy  Plan      Co-evaluation                 AM-PAC OT 6 Clicks Daily Activity     Outcome Measure   Help from another person eating meals?: None Help from another person taking care of personal grooming?: None Help from another person toileting, which includes using toliet, bedpan, or urinal?: A Little Help from another person bathing (including washing, rinsing, drying)?: A Lot Help from another person to put on and taking off regular upper body clothing?: A Little Help from another person to put  on and taking off regular lower body clothing?: A Lot 6 Click Score: 18    End of Session Equipment Utilized During Treatment: Gait belt;Rolling walker (2 wheels);Back brace  OT Visit Diagnosis: Unsteadiness on feet (R26.81);Muscle weakness (generalized) (M62.81)   Activity Tolerance Patient tolerated treatment well   Patient Left in chair;with call bell/phone within reach;with chair alarm set   Nurse Communication Mobility status;Precautions        Time: [phone] OT Time Calculation (min): 28 min  Charges: OT General Charges $OT Visit: 1 Visit OT Treatments $Self Care/Home Management : 23-37 mins  Kathryn [name] K, OTD, OTR/L SecureChat Preferred Acute Rehab [phone]   Kathryn [name] 05/31/2024, 8:30 AM

## 2024-05-31 NOTE — Discharge Summary (Signed)
 Patient ID: Kathryn [name] MRN: [id number] DOB/AGE: [birth year] 69 y.o.  Admit date: 05/22/2024 Discharge date: 05/31/2024  Admission Diagnoses: Other idiopathic scoliosis, lumbar region [M41.26] Other spondylosis with radiculopathy, lumbar region [M47.26]   Discharge Diagnoses: Same   Discharged Condition: Stable  Hospital Course:  Kathryn [name] is a 68 y.o. Kathryn who was admitted on 05/22/24 following  Stage I anterior/lateral lumbar interbody fusion L1-S1, followed by posterior instrumentation and fusion on 05/25/24. Postoperatively patient struggled with significant pain and mobility limitations, both of which she was progressed quite well with. She had decreasing Hgb/Hct which corrected after administration of 2u PRBCs. She also suffered some confusion which corrected with medication adjustment. Postoperative urinary retention has resolved. Patient can ambulate with minimal assistance. Her pain is well managed with PO analgesics. She is tolerating a normal diet. She has had a BM. Her VS are stable. Pt stable for discharge today. Pt to f/u in office for routine post op visit. Pt is in agreement w/ plan.    Discharge Exam: Blood pressure (!) 140/65, pulse 68, temperature 97.6 F (36.4 C), temperature source Oral, resp. rate 16, height 5' 7 (1.702 m), weight 72.6 kg, SpO2 96%.   Disposition: Discharge disposition: 01-Home or Self Care       Discharge Instructions     Incentive spirometry RT   Complete by: As directed    No wound care   Complete by: As directed       Allergies as of 05/31/2024       Reactions   Niacin And Related Shortness Of Breath, Swelling, Other (See Comments)   Neck; skin hot   Codeine Swelling, Other (See Comments)   tongue   Zinc  Swelling        Medication List     TAKE these medications    acetaminophen  500 MG tablet Commonly known as: TYLENOL  Take 500-1,000 mg by mouth every 6 (six) hours as needed (For pain.).   Airsupra  90-80  MCG/ACT Aero Generic drug: Albuterol -Budesonide  Inhale 2 puffs into the lungs daily.   albuterol  108 (90 Base) MCG/ACT inhaler Commonly known as: VENTOLIN  HFA Inhale 2 puffs into the lungs every 4 (four) hours as needed for wheezing or shortness of breath.   ALPRAZolam  0.5 MG tablet Commonly known as: XANAX  Take 0.5 mg by mouth daily as needed for anxiety.   aspirin  EC 81 MG tablet Take 81 mg by mouth daily. Swallow whole.   Biotin  10000 MCG Tabs Take 10,000 mcg by mouth daily.   cyanocobalamin  1000 MCG tablet Commonly known as: VITAMIN B12 Take 1 tablet (1,000 mcg total) by mouth daily.   cyclobenzaprine  10 MG tablet Commonly known as: FLEXERIL  Take 1 tablet (10 mg total) by mouth 3 (three) times daily as needed for muscle spasms.   docusate sodium  100 MG capsule Commonly known as: COLACE Take 1 capsule (100 mg total) by mouth 2 (two) times daily.   EPINEPHrine  0.3 mg/0.3 mL Soaj injection Commonly known as: EPI-PEN Inject 0.3 mg into the skin as needed for anaphylaxis.   escitalopram  20 MG tablet Commonly known as: LEXAPRO  Take 20 mg by mouth daily.   esomeprazole  40 MG capsule Commonly known as: NEXIUM  Take 1 capsule (40 mg total) by mouth daily. TAKE 1 CAPSULE(40 MG) BY MOUTH TWICE DAILY BEFORE A MEAL   fluticasone  50 MCG/ACT nasal spray Commonly known as: FLONASE  Place 2 sprays into both nostrils daily as needed for allergies or rhinitis.   GABAPENTIN  PO Take 100 mg by mouth  at bedtime. 100mg  at night   ibuprofen  800 MG tablet Commonly known as: ADVIL  Take 1 tablet (800 mg total) by mouth every 8 (eight) hours as needed. What changed:  how much to take reasons to take this   levocetirizine 5 MG tablet Commonly known as: XYZAL Take 5 mg by mouth daily.   levothyroxine  25 MCG tablet Commonly known as: SYNTHROID  Take 25 mcg by mouth daily before breakfast.   loratadine  10 MG tablet Commonly known as: CLARITIN  Take 10 mg by mouth daily.    montelukast  10 MG tablet Commonly known as: SINGULAIR  Take 10 mg by mouth every morning.   ondansetron  4 MG tablet Commonly known as: ZOFRAN  Take 1 tablet (4 mg total) by mouth every 8 (eight) hours as needed for nausea or vomiting.   oxyCODONE  5 MG immediate release tablet Commonly known as: Oxy IR/ROXICODONE  Take 1-2 tablets (5-10 mg total) by mouth every 6 (six) hours as needed for moderate pain (pain score 4-6) or severe pain (pain score 7-10).   rOPINIRole  4 MG tablet Commonly known as: REQUIP  Take 6 mg by mouth daily. 4 - 6 mg as needed   rosuvastatin  5 MG tablet Commonly known as: Crestor  Take 1 tablet (5 mg total) by mouth at bedtime.   sucralfate  1 g tablet Commonly known as: CARAFATE  TAKE 1 TABLET(1 GRAM) BY MOUTH FOUR TIMES DAILY AT BEDTIME WITH MEALS   valACYclovir  1000 MG tablet Commonly known as: VALTREX  Take 1,000 mg by mouth daily.   Vitamin D -3 125 MCG (5000 UT) Tabs Take 5,000 Units by mouth daily.         Signed: [name] 05/31/2024, 9:36 AM

## 2024-06-02 ENCOUNTER — Encounter (HOSPITAL_COMMUNITY): Payer: Self-pay | Admitting: Neurosurgery

## 2024-06-02 DIAGNOSIS — Z7951 Long term (current) use of inhaled steroids: Secondary | ICD-10-CM | POA: Diagnosis not present

## 2024-06-02 DIAGNOSIS — Z87891 Personal history of nicotine dependence: Secondary | ICD-10-CM | POA: Diagnosis not present

## 2024-06-02 DIAGNOSIS — M4326 Fusion of spine, lumbar region: Secondary | ICD-10-CM | POA: Diagnosis not present

## 2024-06-02 DIAGNOSIS — Z7982 Long term (current) use of aspirin: Secondary | ICD-10-CM | POA: Diagnosis not present

## 2024-06-02 DIAGNOSIS — M4126 Other idiopathic scoliosis, lumbar region: Secondary | ICD-10-CM | POA: Diagnosis not present

## 2024-06-02 DIAGNOSIS — Z79899 Other long term (current) drug therapy: Secondary | ICD-10-CM | POA: Diagnosis not present

## 2024-06-02 DIAGNOSIS — Z9049 Acquired absence of other specified parts of digestive tract: Secondary | ICD-10-CM | POA: Diagnosis not present

## 2024-06-02 DIAGNOSIS — M4726 Other spondylosis with radiculopathy, lumbar region: Secondary | ICD-10-CM | POA: Diagnosis not present

## 2024-06-02 DIAGNOSIS — F32A Depression, unspecified: Secondary | ICD-10-CM | POA: Diagnosis not present

## 2024-06-02 DIAGNOSIS — J4489 Other specified chronic obstructive pulmonary disease: Secondary | ICD-10-CM | POA: Diagnosis not present

## 2024-06-02 DIAGNOSIS — Z4789 Encounter for other orthopedic aftercare: Secondary | ICD-10-CM | POA: Diagnosis not present

## 2024-06-02 DIAGNOSIS — E039 Hypothyroidism, unspecified: Secondary | ICD-10-CM | POA: Diagnosis not present

## 2024-06-02 DIAGNOSIS — M48061 Spinal stenosis, lumbar region without neurogenic claudication: Secondary | ICD-10-CM | POA: Diagnosis not present

## 2024-06-02 DIAGNOSIS — G610 Guillain-Barre syndrome: Secondary | ICD-10-CM | POA: Diagnosis not present

## 2024-06-02 DIAGNOSIS — F419 Anxiety disorder, unspecified: Secondary | ICD-10-CM | POA: Diagnosis not present

## 2024-06-02 DIAGNOSIS — Z556 Problems related to health literacy: Secondary | ICD-10-CM | POA: Diagnosis not present

## 2024-06-02 DIAGNOSIS — M4316 Spondylolisthesis, lumbar region: Secondary | ICD-10-CM | POA: Diagnosis not present

## 2024-06-02 NOTE — Addendum Note (Signed)
 Addendum  created 06/02/24 1117 by Dorethea Cordella SQUIBB, DO   Intraprocedure Event edited, Intraprocedure Staff edited

## 2024-06-14 DIAGNOSIS — J449 Chronic obstructive pulmonary disease, unspecified: Secondary | ICD-10-CM | POA: Diagnosis not present

## 2024-06-14 DIAGNOSIS — K219 Gastro-esophageal reflux disease without esophagitis: Secondary | ICD-10-CM | POA: Diagnosis not present

## 2024-06-16 ENCOUNTER — Encounter (INDEPENDENT_AMBULATORY_CARE_PROVIDER_SITE_OTHER): Payer: Self-pay | Admitting: Gastroenterology

## 2024-06-16 DIAGNOSIS — J3081 Allergic rhinitis due to animal (cat) (dog) hair and dander: Secondary | ICD-10-CM | POA: Diagnosis not present

## 2024-06-16 DIAGNOSIS — J3089 Other allergic rhinitis: Secondary | ICD-10-CM | POA: Diagnosis not present

## 2024-06-16 DIAGNOSIS — J301 Allergic rhinitis due to pollen: Secondary | ICD-10-CM | POA: Diagnosis not present

## 2024-06-21 ENCOUNTER — Encounter (INDEPENDENT_AMBULATORY_CARE_PROVIDER_SITE_OTHER): Payer: Self-pay | Admitting: Gastroenterology

## 2024-06-23 DIAGNOSIS — J301 Allergic rhinitis due to pollen: Secondary | ICD-10-CM | POA: Diagnosis not present

## 2024-06-23 DIAGNOSIS — J3081 Allergic rhinitis due to animal (cat) (dog) hair and dander: Secondary | ICD-10-CM | POA: Diagnosis not present

## 2024-06-23 DIAGNOSIS — J3089 Other allergic rhinitis: Secondary | ICD-10-CM | POA: Diagnosis not present

## 2024-06-30 DIAGNOSIS — J3089 Other allergic rhinitis: Secondary | ICD-10-CM | POA: Diagnosis not present

## 2024-06-30 DIAGNOSIS — J301 Allergic rhinitis due to pollen: Secondary | ICD-10-CM | POA: Diagnosis not present

## 2024-06-30 DIAGNOSIS — J3081 Allergic rhinitis due to animal (cat) (dog) hair and dander: Secondary | ICD-10-CM | POA: Diagnosis not present

## 2024-07-07 DIAGNOSIS — J301 Allergic rhinitis due to pollen: Secondary | ICD-10-CM | POA: Diagnosis not present

## 2024-07-07 DIAGNOSIS — J3081 Allergic rhinitis due to animal (cat) (dog) hair and dander: Secondary | ICD-10-CM | POA: Diagnosis not present

## 2024-07-07 DIAGNOSIS — J3089 Other allergic rhinitis: Secondary | ICD-10-CM | POA: Diagnosis not present

## 2024-07-07 DIAGNOSIS — M4126 Other idiopathic scoliosis, lumbar region: Secondary | ICD-10-CM | POA: Diagnosis not present

## 2024-07-11 DIAGNOSIS — L718 Other rosacea: Secondary | ICD-10-CM | POA: Diagnosis not present

## 2024-07-11 DIAGNOSIS — D1801 Hemangioma of skin and subcutaneous tissue: Secondary | ICD-10-CM | POA: Diagnosis not present

## 2024-07-11 DIAGNOSIS — L578 Other skin changes due to chronic exposure to nonionizing radiation: Secondary | ICD-10-CM | POA: Diagnosis not present

## 2024-07-11 DIAGNOSIS — L814 Other melanin hyperpigmentation: Secondary | ICD-10-CM | POA: Diagnosis not present

## 2024-07-11 DIAGNOSIS — L821 Other seborrheic keratosis: Secondary | ICD-10-CM | POA: Diagnosis not present

## 2024-07-14 DIAGNOSIS — J3081 Allergic rhinitis due to animal (cat) (dog) hair and dander: Secondary | ICD-10-CM | POA: Diagnosis not present

## 2024-07-14 DIAGNOSIS — J3089 Other allergic rhinitis: Secondary | ICD-10-CM | POA: Diagnosis not present

## 2024-07-14 DIAGNOSIS — J301 Allergic rhinitis due to pollen: Secondary | ICD-10-CM | POA: Diagnosis not present

## 2024-07-21 DIAGNOSIS — J301 Allergic rhinitis due to pollen: Secondary | ICD-10-CM | POA: Diagnosis not present

## 2024-07-21 DIAGNOSIS — J3089 Other allergic rhinitis: Secondary | ICD-10-CM | POA: Diagnosis not present

## 2024-07-21 DIAGNOSIS — J3081 Allergic rhinitis due to animal (cat) (dog) hair and dander: Secondary | ICD-10-CM | POA: Diagnosis not present

## 2024-07-27 DIAGNOSIS — N390 Urinary tract infection, site not specified: Secondary | ICD-10-CM | POA: Diagnosis not present

## 2024-07-27 DIAGNOSIS — R609 Edema, unspecified: Secondary | ICD-10-CM | POA: Diagnosis not present

## 2024-07-28 DIAGNOSIS — J3089 Other allergic rhinitis: Secondary | ICD-10-CM | POA: Diagnosis not present

## 2024-07-28 DIAGNOSIS — J3081 Allergic rhinitis due to animal (cat) (dog) hair and dander: Secondary | ICD-10-CM | POA: Diagnosis not present

## 2024-07-28 DIAGNOSIS — J301 Allergic rhinitis due to pollen: Secondary | ICD-10-CM | POA: Diagnosis not present

## 2024-08-01 DIAGNOSIS — J3089 Other allergic rhinitis: Secondary | ICD-10-CM | POA: Diagnosis not present

## 2024-08-01 DIAGNOSIS — J3081 Allergic rhinitis due to animal (cat) (dog) hair and dander: Secondary | ICD-10-CM | POA: Diagnosis not present

## 2024-08-01 DIAGNOSIS — J301 Allergic rhinitis due to pollen: Secondary | ICD-10-CM | POA: Diagnosis not present

## 2024-08-10 DIAGNOSIS — J3081 Allergic rhinitis due to animal (cat) (dog) hair and dander: Secondary | ICD-10-CM | POA: Diagnosis not present

## 2024-08-10 DIAGNOSIS — J3089 Other allergic rhinitis: Secondary | ICD-10-CM | POA: Diagnosis not present

## 2024-08-10 DIAGNOSIS — J301 Allergic rhinitis due to pollen: Secondary | ICD-10-CM | POA: Diagnosis not present

## 2024-09-09 ENCOUNTER — Ambulatory Visit
Admission: EM | Admit: 2024-09-09 | Discharge: 2024-09-09 | Disposition: A | Discharge status: Home / Self Care | Attending: Nurse Practitioner | Admitting: Nurse Practitioner

## 2024-09-09 ENCOUNTER — Other Ambulatory Visit: Payer: Self-pay

## 2024-09-09 DIAGNOSIS — R059 Cough, unspecified: Secondary | ICD-10-CM | POA: Diagnosis not present

## 2024-09-09 DIAGNOSIS — J209 Acute bronchitis, unspecified: Secondary | ICD-10-CM

## 2024-09-09 MED ORDER — METHYLPREDNISOLONE SODIUM SUCC 125 MG IJ SOLR
80.0000 mg | Freq: Once | INTRAMUSCULAR | Status: AC
  Administered 2024-09-09: 80 mg via INTRAMUSCULAR

## 2024-09-09 MED ORDER — PREDNISONE 20 MG PO TABS: 40.0000 mg | ORAL_TABLET | Freq: Every day | ORAL | 0 refills | Status: AC

## 2024-09-09 MED ORDER — PROMETHAZINE-DM 6.25-15 MG/5ML PO SYRP: 5.0000 mL | ORAL_SOLUTION | Freq: Four times a day (QID) | ORAL | 0 refills | Status: AC | PRN

## 2024-09-09 NOTE — ED Triage Notes (Signed)
 Pt being seen in UC for cough, nasal congestion, weakness, shortness of breath, sore throat, chest congestion, and headache x2 weeks. Pt reports having fever earlier on in sickness. Pt reports decrease in appetite. Pt reports taking abx just recently for rosacea.

## 2024-09-09 NOTE — Discharge Instructions (Addendum)
 You were given an injection of Solu-Medrol  80 mg.  Start the prednisone  tomorrow. Take medication as prescribed.  Start the prednisone  tomorrow. You may take over-the-counter Tylenol  as needed for pain, fever, or general discomfort. Increase fluids and allow for plenty of rest. Recommend the use of a humidifier in your bedroom at nighttime during sleep and sleeping elevated on pillows while symptoms persist. Your cough may last from days to weeks.  If you are generally feeling well, but continued to have a persistent nagging cough, continue over-the-counter cough medications, fluids, and cough drops.  Seek care if you develop fever, chills, wheezing, or other concerns. Go to the emergency department if you develop worsening shortness of breath, difficulty breathing, chest pain, or other concerns. Follow-up with your primary care physician if symptoms fail to improve. Follow-up as needed.

## 2024-09-09 NOTE — ED Provider Notes (Signed)
 " [facility]    CSN: [id number] Arrival date & time: 09/09/24  1454      History   Chief Complaint Chief Complaint  Patient presents with   Cough   Headache   Otalgia    HPI Kathryn [name] is a 69 y.o. female.   The history is provided by the patient.   Patient presents for complaints of cough generalized fatigue, shortness of breath, wheezing,and chest congestion that has been present for the past several days.  Patient states she began getting sick approximately 2 weeks ago.  She states that she believes she had a fever when symptoms initially started.  She denies recent fever, headache, ear pain, sore throat, chest pain, abdominal pain, nausea, vomiting, diarrhea, or rash.  Patient states that she has not taken any medications for her cough.  She states that she gets winded easily with any activity.  Patient reports she was recently on doxycycline for rosacea, states that she was on it for an extended amount of time.  States that she finished the medication approximately 2 days ago.  So far, she has not taken any other medications for her symptoms.  She does see an allergist, patient does have prescriptions for inhalers such as Airsupra .  Past Medical History:  Diagnosis Date   Adrenal insufficiency    occured following steroid shoulder injections ~ spring 2017   Anxiety    pt. reports that she has RLS- takes Xanax  for calming her self so she can settle & sleep    Arthritis    shoulder & back, knees & leg     Asthma    aleery related   Atypical mole 11/12/2020   mod-severe- mid back   Chest pain, atypical 2020   had cardiac workup - deemed related to anxiety   Chronic bronchitis (HCC)    less since I quit smoking (02/21/2016)   Chronic lower back pain    Dyspnea    episodic with allergen triggers   GERD (gastroesophageal reflux disease)    Guillain-Barre syndrome following vaccination 1970's   post swine flu shot, hosp. for treatment for 1 month    H/O  dizziness    H/O hiatal hernia    Hepatitis    hepatitis as a child; got it from a little girl at school make sick and vomiting   Herpes simplex    History of Guillain-Barre syndrome ~ 1978   from the swine flu shot   Hypothyroidism    IBS (irritable bowel syndrome)    Pneumonia    PONV (postoperative nausea and vomiting)    Restless leg syndrome    TIA (transient ischemic attack)    Walking pneumonia ~ 2011    Patient Active Problem List   Diagnosis Date Noted   Other spondylosis with radiculopathy, lumbar region 05/22/2024   Chronic back pain 05/09/2024   Elevated LFTs 08/24/2022   Depression 06/18/2022   TIA (transient ischemic attack) 06/17/2022   Chronic pain 06/17/2022   Hyperglycemia 06/17/2022   Anxiety 06/17/2022   Asthma, chronic 06/17/2022   Hypothyroidism 06/17/2022   Restless leg syndrome 06/17/2022   Nausea without vomiting 02/10/2022   Diarrhea 04/09/2021   Pain in right knee 11/23/2019   IBS (irritable bowel syndrome) 06/27/2019   Dysphagia 05/08/2019   Paresthesias 04/27/2019   Hiatal hernia with GERD without esophagitis 11/27/2016   Gastroesophageal reflux disease    Hiatal hernia    S/P shoulder replacement 02/21/2016   DYSPNEA 12/13/2009  CHEST PAIN, ATYPICAL 12/13/2009    Past Surgical History:  Procedure Laterality Date   20 HOUR PH STUDY N/A 09/23/2016   Procedure: 24 HOUR PH STUDY;  Surgeon: Gustav Shila GAILS, MD;  Location: [facility];  Service: Endoscopy;  Laterality: N/A;   ABDOMINAL EXPOSURE N/A 05/22/2024   Procedure: ABDOMINAL EXPOSURE;  Surgeon: Gretta Lonni PARAS, MD;  Location: [facility] OR;  Service: Vascular;  Laterality: N/A;   ANTERIOR CERVICAL DECOMP/DISCECTOMY FUSION  2000 & 2015   two times ago- C5 and C6, Dr. Lynwood JONELLE Mill, MD 2015   ANTERIOR LAT LUMBAR FUSION Right 05/22/2024   Procedure: RIGHT DIRECT ANTERIOR LATERAL LUMBAR FUSION LUMBAR ONE-LUMBAR TWO, LUMBAR TWO-LUMBAR THREE, LUMBAR THREE-LUMBAR FOUR, LUMBAR TWO-LUMBAR  THREE ANTEROLATERAL PLATE;  Surgeon: Debby Dorn MATSU, MD;  Location: [facility] OR;  Service: Neurosurgery;  Laterality: Right;  RT, L12, L23, L34 DLIF   ANTERIOR LUMBAR FUSION N/A 05/22/2024   Procedure: ANTERIOR LUMBAR FUSION LUMBAR FIVE- SACRAL ONE;  Surgeon: Debby Dorn MATSU, MD;  Location: [facility] OR;  Service: Neurosurgery;  Laterality: N/A;  L5-S1 ALIF   APPENDECTOMY  1980s   BACK SURGERY     BIOPSY  03/24/2022   Procedure: BIOPSY;  Surgeon: Eartha Flavors, Toribio, MD;  Location: [facility];  Service: Gastroenterology;;   BREAST BIOPSY Right    benign   CHOLECYSTECTOMY OPEN  1980s   COLONOSCOPY N/A 09/20/2014   rehman: Prep was excellent except she had thick layer of stool coating cecal and ascending colon mucosa. Cecal landmarks were well identified after vigorous washing. Small lymphoid polyp ablated via cold biopsy from appendiceal stump.Mucosa of rest of the colon was normal. Normal mucosa of rectum and anorectal junction.   ESOPHAGEAL DILATION N/A 05/29/2019   Procedure: ESOPHAGEAL DILATION;  Surgeon: Golda Claudis PENNER, MD;  Location: [facility];  Service: Endoscopy;  Laterality: N/A;   ESOPHAGEAL MANOMETRY N/A 09/23/2016   Procedure: ESOPHAGEAL MANOMETRY (EM);  Surgeon: Gustav Shila GAILS, MD;  Location: [facility];  Service: Endoscopy;  Laterality: N/A;   ESOPHAGOGASTRODUODENOSCOPY N/A 09/20/2014   Procedure: ESOPHAGOGASTRODUODENOSCOPY (EGD);  Surgeon: Claudis PENNER Golda, MD;  Location: [facility];  Service: Endoscopy;  Laterality: N/A;   ESOPHAGOGASTRODUODENOSCOPY (EGD) WITH PROPOFOL  N/A 05/29/2019   - Z-line, 38 cm from the incisors.   ESOPHAGOGASTRODUODENOSCOPY (EGD) WITH PROPOFOL  N/A 03/24/2022   Procedure: ESOPHAGOGASTRODUODENOSCOPY (EGD) WITH PROPOFOL ;  Surgeon: Eartha Flavors Toribio, MD;  Location: [facility];  Service: Gastroenterology;  Laterality: N/A;  730 ASA 1   HIATAL HERNIA REPAIR N/A 11/27/2016   Procedure: LAPAROSCOPIC REPAIR OF HIATAL HERNIA WITH NISSEN  FUNDOPLICATION;  Surgeon: Krystal Russell, MD;  Location: [facility];  Service: General;  Laterality: N/A;   INCISION AND DRAINAGE OF WOUND Left 02/21/2016   forearm, minor   IR ANGIO INTRA EXTRACRAN SEL INTERNAL CAROTID BILAT MOD SED  11/03/2022   IR ANGIO VERTEBRAL SEL VERTEBRAL UNI L MOD SED  11/03/2022   LUMBAR LAMINECTOMY/DECOMPRESSION MICRODISCECTOMY Right 06/22/2013   Procedure: RIGHT LUMBAR TWO-THREE DISCECTOMY;  Surgeon: Lynwood JONELLE Mill, MD;  Location: [facility];  Service: Neurosurgery;  Laterality: Right;  Right    MALONEY DILATION N/A 09/20/2014   Procedure: AGAPITO DILATION;  Surgeon: Claudis PENNER Golda, MD;  Location: [facility];  Service: Endoscopy;  Laterality: N/A;   MINOR IRRIGATION AND DEBRIDEMENT OF WOUND Left 02/21/2016   Procedure: MINOR IRRIGATION AND DEBRIDEMENT/REPAIR OF LEFT ARM WOUND;  Surgeon: Marcey Her, MD;  Location: [facility] OR;  Service: Orthopedics;  Laterality: Left;  REVERSE SHOULDER ARTHROPLASTY Right 02/21/2016   Procedure: RIGHT REVERSE TOTAL SHOULDER ARTHROPLASTY;  Surgeon: Marcey Her, MD;  Location[facility] OR;  Service: Orthopedics;  Laterality: Right;   REVERSE TOTAL SHOULDER ARTHROPLASTY Right 02/21/2016   SAVORY DILATION  03/24/2022   Procedure: SAVORY DILATION;  Surgeon: Eartha Flavors, Toribio, MD;  Location: [facility];  Service: Gastroenterology;;   SHOULDER ARTHROSCOPY W/ ROTATOR CUFF REPAIR Right 1990s   TOTAL SHOULDER REPLACEMENT     Right   VAGINAL HYSTERECTOMY      OB History     Gravida  1   Para  1   Term  1   Preterm      AB      Living  1      SAB      IAB      Ectopic      Multiple      Live Births  1            Home Medications    Prior to Admission medications  Medication Sig Start Date End Date Taking? Authorizing Provider  predniSONE  (DELTASONE ) 20 MG tablet Take 2 tablets (40 mg total) by mouth daily with breakfast for 5 days. 09/09/24 09/14/24 Yes Leath-Warren, Etta PARAS, NP   promethazine -dextromethorphan  (PROMETHAZINE -DM) 6.25-15 MG/5ML syrup Take 5 mLs by mouth 4 (four) times daily as needed. 09/09/24  Yes Leath-Warren, Etta PARAS, NP  acetaminophen  (TYLENOL ) 500 MG tablet Take 500-1,000 mg by mouth every 6 (six) hours as needed (For pain.).    [provider]  AIRSUPRA  90-80 MCG/ACT AERO Inhale 2 puffs into the lungs daily.    [provider]  albuterol  (PROVENTIL  HFA;VENTOLIN  HFA) 108 (90 Base) MCG/ACT inhaler Inhale 2 puffs into the lungs every 4 (four) hours as needed for wheezing or shortness of breath. Patient not taking: Reported on 09/09/2024    [provider]  ALPRAZolam  (XANAX ) 0.5 MG tablet Take 0.5 mg by mouth daily as needed for anxiety. Patient not taking: Reported on 05/18/2024    [provider]  aspirin  EC 81 MG tablet Take 81 mg by mouth daily. Swallow whole.    [provider]  Biotin  10000 MCG TABS Take 10,000 mcg by mouth daily.  Patient not taking: Reported on 09/09/2024    [provider]  Cholecalciferol  (VITAMIN D -3) 125 MCG (5000 UT) TABS Take 5,000 Units by mouth daily.  Patient not taking: Reported on 09/09/2024    [provider]  cyanocobalamin  (VITAMIN B12) 1000 MCG tablet Take 1 tablet (1,000 mcg total) by mouth daily. Patient not taking: Reported on 09/09/2024 06/18/22   Ricky Fines, MD  cyclobenzaprine  (FLEXERIL ) 10 MG tablet Take 1 tablet (10 mg total) by mouth 3 (three) times daily as needed for muscle spasms. 05/31/24   Johnanna Credit Caylin, PA-C  docusate sodium  (COLACE) 100 MG capsule Take 1 capsule (100 mg total) by mouth 2 (two) times daily. 05/31/24   Johnanna Credit Mates, PA-C  EPINEPHrine  0.3 mg/0.3 mL IJ SOAJ injection Inject 0.3 mg into the skin as needed for anaphylaxis. 11/21/21   [provider]  escitalopram  (LEXAPRO ) 20 MG tablet Take 20 mg by mouth daily.    [provider]  esomeprazole  (NEXIUM ) 40 MG capsule Take 1 capsule (40 mg total) by  mouth daily. TAKE 1 CAPSULE(40 MG) BY MOUTH TWICE DAILY BEFORE A MEAL 05/12/24   Carlan, Chelsea L, NP  fluticasone  (FLONASE ) 50 MCG/ACT nasal spray Place 2 sprays into both nostrils daily as needed  for allergies or rhinitis. Patient not taking: Reported on 09/09/2024    [provider]  GABAPENTIN  PO Take 100 mg by mouth at bedtime. 100mg  at night    [provider]  ibuprofen  (ADVIL ) 800 MG tablet Take 1 tablet (800 mg total) by mouth every 8 (eight) hours as needed. Patient taking differently: Take 200 mg by mouth every 8 (eight) hours as needed for mild pain (pain score 1-3) or moderate pain (pain score 4-6). 04/28/23   Onesimo Oneil LABOR, MD  levocetirizine (XYZAL) 5 MG tablet Take 5 mg by mouth daily.    [provider]  levothyroxine  (SYNTHROID ) 25 MCG tablet Take 25 mcg by mouth daily before breakfast.    [provider]  loratadine  (CLARITIN ) 10 MG tablet Take 10 mg by mouth daily.    [provider]  montelukast  (SINGULAIR ) 10 MG tablet Take 10 mg by mouth every morning.  Patient not taking: Reported on 05/18/2024    [provider]  ondansetron  (ZOFRAN ) 4 MG tablet Take 1 tablet (4 mg total) by mouth every 8 (eight) hours as needed for nausea or vomiting. 08/24/23   Carlan, Chelsea L, NP  oxyCODONE  (OXY IR/ROXICODONE ) 5 MG immediate release tablet Take 1-2 tablets (5-10 mg total) by mouth every 6 (six) hours as needed for moderate pain (pain score 4-6) or severe pain (pain score 7-10). Patient not taking: Reported on 09/09/2024 05/31/24   Johnanna Credit Caylin, PA-C  rOPINIRole  (REQUIP ) 4 MG tablet Take 6 mg by mouth daily. 4 - 6 mg as needed 11/12/22   [provider]  rosuvastatin  (CRESTOR ) 5 MG tablet Take 1 tablet (5 mg total) by mouth at bedtime. 01/21/23   Camara, Amadou, MD  sucralfate  (CARAFATE ) 1 g tablet TAKE 1 TABLET(1 GRAM) BY MOUTH FOUR TIMES DAILY AT BEDTIME WITH MEALS Patient not taking: No sig reported 11/01/23   Carlan,  Chelsea L, NP  valACYclovir  (VALTREX ) 1000 MG tablet Take 1,000 mg by mouth daily. 10/29/16   [provider]    Family History Family History  Problem Relation Age of Onset   Congestive Heart Failure Mother    Atrial fibrillation Mother    Hiatal hernia Mother    Cancer Father        lung   Heart attack Brother    Stroke Paternal Grandfather    Diabetes Paternal Grandfather    Alzheimer's disease Paternal Grandmother     Social History Social History[1]   Allergies   Niacin and related, Codeine, and Zinc    Review of Systems Review of Systems Per HPI  Physical Exam Triage Vital Signs ED Triage Vitals  Encounter Vitals Group     BP 09/09/24 1505 118/77     Girls Systolic BP Percentile --      Girls Diastolic BP Percentile --      Boys Systolic BP Percentile --      Boys Diastolic BP Percentile --      Pulse Rate 09/09/24 1505 74     Resp 09/09/24 1505 17     Temp 09/09/24 1505 98 F (36.7 C)     Temp Source 09/09/24 1505 Oral     SpO2 09/09/24 1505 97 %     Weight --      Height --      Head Circumference --      Peak Flow --      Pain Score 09/09/24 1506 5     Pain Loc --  Pain Education --      Exclude from Growth Chart --    No data found.  Updated Vital Signs BP 118/77 (BP Location: Right Arm)   Pulse 74   Temp 98 F (36.7 C) (Oral)   Resp 17   SpO2 97%   Visual Acuity Right Eye Distance:   Left Eye Distance:   Bilateral Distance:    Right Eye Near:   Left Eye Near:    Bilateral Near:     Physical Exam Vitals and nursing note reviewed.  Constitutional:      General: She is not in acute distress.    Appearance: Normal appearance.  HENT:     Head: Normocephalic.     Right Ear: Tympanic membrane, ear canal and external ear normal.     Left Ear: Tympanic membrane, ear canal and external ear normal.     Nose: Congestion present.  Eyes:     Extraocular Movements: Extraocular movements intact.     Pupils: Pupils are equal,  round, and reactive to light.  Cardiovascular:     Rate and Rhythm: Normal rate and regular rhythm.     Pulses: Normal pulses.     Heart sounds: Normal heart sounds.  Pulmonary:     Effort: Pulmonary effort is normal. No respiratory distress.     Breath sounds: No stridor. Examination of the right-lower field reveals rhonchi. Examination of the left-lower field reveals rhonchi. Rhonchi present. No wheezing or rales.     Comments: Rhonchi noted in the posterior lower lung fields.  Rhonchi clears with cough. Musculoskeletal:     Cervical back: Normal range of motion.  Skin:    General: Skin is warm and dry.  Neurological:     General: No focal deficit present.     Mental Status: She is alert and oriented to person, place, and time.  Psychiatric:        Mood and Affect: Mood normal.        Behavior: Behavior normal.      UC Treatments / Results  Labs (all labs ordered are listed, but only abnormal results are displayed) Labs Reviewed - No data to display  EKG   Radiology No results found.  Procedures Procedures (including critical care time)  Medications Ordered in UC Medications  methylPREDNISolone  sodium succinate  (SOLU-MEDROL ) 125 mg/2 mL injection 80 mg (has no administration in time range)    Initial Impression / Assessment and Plan / UC Course  I have reviewed the triage vital signs and the nursing notes.  Pertinent labs & imaging results that were available during my care of the patient were reviewed by me and considered in my medical decision making (see chart for details).  On exam, the patient does have rhonchi in the posterior bilateral lower lobes.  Rhonchi clears with coughing.  Baseline O2 sats at 97%.  Patient continues with persistent cough.  She has completed doxycycline within the past 2 days.  She denies new fever.  She does endorse wheezing and shortness of breath.  Symptoms are consistent with acute bronchitis.  Solu-Medrol  80 mg IM administered.  Will  start patient on prednisone  40 mg for the next 5 days along with Promethazine  DM for the cough.  Supportive care recommendations were provided and discussed with the patient to include over-the-counter Tylenol , fluids, rest, and to begin use of the inhalers that she has at home, specifically the Airsupra .  Discussed indications with the patient regarding follow-up.  Patient was in agreement with this  plan of care and verbalizes understanding.  All questions were answered.  Patient stable for discharge.  Final Clinical Impressions(s) / UC Diagnoses   Final diagnoses:  Acute bronchitis, unspecified organism  Cough, unspecified type   Discharge Instructions   None    ED Prescriptions     Medication Sig Dispense Auth. Provider   predniSONE  (DELTASONE ) 20 MG tablet Take 2 tablets (40 mg total) by mouth daily with breakfast for 5 days. 10 tablet Leath-Warren, Etta PARAS, NP   promethazine -dextromethorphan  (PROMETHAZINE -DM) 6.25-15 MG/5ML syrup Take 5 mLs by mouth 4 (four) times daily as needed. 118 mL Leath-Warren, Etta PARAS, NP      PDMP not reviewed this encounter.     [1]  Social History Tobacco Use   Smoking status: Former    Current packs/day: 0.00    Average packs/day: 3.0 packs/day for 10.0 years (30.0 ttl pk-yrs)    Types: Cigarettes    Start date: 05/08/1989    Quit date: 05/09/1999    Years since quitting: 25.3    Passive exposure: Current   Smokeless tobacco: Never   Tobacco comments:    quit smoking in ~ 2003  Vaping Use   Vaping status: Never Used  Substance Use Topics   Alcohol  use: No    Comment: 02/21/2016 I used to drink a little bit; probably quit in my 20s   Drug use: No     Gilmer Etta PARAS, NP 09/09/24 1544  "

## 2024-09-14 ENCOUNTER — Other Ambulatory Visit (HOSPITAL_COMMUNITY): Payer: Self-pay | Admitting: Neurosurgery

## 2024-09-14 DIAGNOSIS — M7989 Other specified soft tissue disorders: Secondary | ICD-10-CM

## 2024-09-15 ENCOUNTER — Ambulatory Visit (HOSPITAL_COMMUNITY)
Admission: RE | Admit: 2024-09-15 | Discharge: 2024-09-15 | Disposition: A | Source: Ambulatory Visit | Attending: Surgery | Admitting: Surgery

## 2024-09-15 DIAGNOSIS — M7989 Other specified soft tissue disorders: Secondary | ICD-10-CM | POA: Diagnosis present

## 2024-09-28 ENCOUNTER — Encounter (INDEPENDENT_AMBULATORY_CARE_PROVIDER_SITE_OTHER): Payer: Self-pay | Admitting: *Deleted

## 2024-10-04 NOTE — Therapy (Addendum)
 " OUTPATIENT PHYSICAL THERAPY THORACOLUMBAR EVALUATION   Patient Name: Kathryn [name] MRN: [id number] DOB:[birth year], 69 y.o., female Today's Date: 10/05/2024  END OF SESSION:  PT End of Session - 10/05/24 0900     Visit Number 1    Number of Visits 16    Date for Recertification  11/02/24    Authorization Type [employer]    Authorization Time Period Auth requested    Progress Note Due on Visit 10    PT Start Time 0905    PT Stop Time 0948    PT Time Calculation (min) 43 min    Activity Tolerance Patient tolerated treatment well    Behavior During Therapy [facility] for tasks assessed/performed          Past Medical History:  Diagnosis Date   Adrenal insufficiency    occured following steroid shoulder injections ~ spring 2017   Anxiety    pt. reports that she has RLS- takes Xanax  for calming her self so she can settle & sleep    Arthritis    shoulder & back, knees & leg     Asthma    aleery related   Atypical mole 11/12/2020   mod-severe- mid back   Chest pain, atypical 2020   had cardiac workup - deemed related to anxiety   Chronic bronchitis (HCC)    less since I quit smoking (02/21/2016)   Chronic lower back pain    Dyspnea    episodic with allergen triggers   GERD (gastroesophageal reflux disease)    Guillain-Barre syndrome following vaccination 1970's   post swine flu shot, hosp. for treatment for 1 month    H/O dizziness    H/O hiatal hernia    Hepatitis    hepatitis as a child; got it from a little girl at school make sick and vomiting   Herpes simplex    History of Guillain-Barre syndrome ~ 1978   from the swine flu shot   Hypothyroidism    IBS (irritable bowel syndrome)    Pneumonia    PONV (postoperative nausea and vomiting)    Restless leg syndrome    TIA (transient ischemic attack)    Walking pneumonia ~ 2011   Past Surgical History:  Procedure Laterality Date   37 HOUR PH STUDY N/A 09/23/2016   Procedure: 24 HOUR PH STUDY;  Surgeon:  Gustav Shila GAILS, MD;  Location: [facility];  Service: Endoscopy;  Laterality: N/A;   ABDOMINAL EXPOSURE N/A 05/22/2024   Procedure: ABDOMINAL EXPOSURE;  Surgeon: Gretta Lonni PARAS, MD;  Location: [facility] OR;  Service: Vascular;  Laterality: N/A;   ANTERIOR CERVICAL DECOMP/DISCECTOMY FUSION  2000 & 2015   two times ago- C5 and C6, Dr. Lynwood JONELLE Mill, MD 2015   ANTERIOR LAT LUMBAR FUSION Right 05/22/2024   Procedure: RIGHT DIRECT ANTERIOR LATERAL LUMBAR FUSION LUMBAR ONE-LUMBAR TWO, LUMBAR TWO-LUMBAR THREE, LUMBAR THREE-LUMBAR FOUR, LUMBAR TWO-LUMBAR THREE ANTEROLATERAL PLATE;  Surgeon: Debby Dorn MATSU, MD;  Location: [facility];  Service: Neurosurgery;  Laterality: Right;  RT, L12, L23, L34 DLIF   ANTERIOR LUMBAR FUSION N/A 05/22/2024   Procedure: ANTERIOR LUMBAR FUSION LUMBAR FIVE- SACRAL ONE;  Surgeon: Debby Dorn MATSU, MD;  Location: [facility] OR;  Service: Neurosurgery;  Laterality: N/A;  L5-S1 ALIF   APPENDECTOMY  1980s   BACK SURGERY     BIOPSY  03/24/2022   Procedure: BIOPSY;  Surgeon: Eartha Angelia Sieving, MD;  [facility]: [facility];  Service: Gastroenterology;;   BREAST BIOPSY Right  benign   CHOLECYSTECTOMY OPEN  1980s   COLONOSCOPY N/A 09/20/2014   rehman: Prep was excellent except she had thick layer of stool coating cecal and ascending colon mucosa. Cecal landmarks were well identified after vigorous washing. Small lymphoid polyp ablated via cold biopsy from appendiceal stump.Mucosa of rest of the colon was normal. Normal mucosa of rectum and anorectal junction.   ESOPHAGEAL DILATION N/A 05/29/2019   Procedure: ESOPHAGEAL DILATION;  Surgeon: Golda Claudis PENNER, MD;  [facility];  Service: Endoscopy;  Laterality: N/A;   ESOPHAGEAL MANOMETRY N/A 09/23/2016   Procedure: ESOPHAGEAL MANOMETRY (EM);  Surgeon: Gustav Shila GAILS, MD;  Location: [facility];  Service: Endoscopy;  Laterality: N/A;   ESOPHAGOGASTRODUODENOSCOPY N/A 09/20/2014   Procedure:  ESOPHAGOGASTRODUODENOSCOPY (EGD);  Surgeon: Claudis PENNER Golda, MD;  [facility];  Service: Endoscopy;  Laterality: N/A;   ESOPHAGOGASTRODUODENOSCOPY (EGD) WITH PROPOFOL  N/A 05/29/2019   - Z-line, 38 cm from the incisors.   ESOPHAGOGASTRODUODENOSCOPY (EGD) WITH PROPOFOL  N/A 03/24/2022   Procedure: ESOPHAGOGASTRODUODENOSCOPY (EGD) WITH PROPOFOL ;  Surgeon: Eartha Angelia Sieving, MD;  [facility];  Service: Gastroenterology;  Laterality: N/A;  730 ASA 1   HIATAL HERNIA REPAIR N/A 11/27/2016   Procedure: LAPAROSCOPIC REPAIR OF HIATAL HERNIA WITH NISSEN FUNDOPLICATION;  Surgeon: Krystal Russell, MD;  Location: [facility];  Service: General;  Laterality: N/A;   INCISION AND DRAINAGE OF WOUND Left 02/21/2016   forearm, minor   IR ANGIO INTRA EXTRACRAN SEL INTERNAL CAROTID BILAT MOD SED  11/03/2022   IR ANGIO VERTEBRAL SEL VERTEBRAL UNI L MOD SED  11/03/2022   LUMBAR LAMINECTOMY/DECOMPRESSION MICRODISCECTOMY Right 06/22/2013   Procedure: RIGHT LUMBAR TWO-THREE DISCECTOMY;  Surgeon: Lynwood JONELLE Mill, MD;  Location: [facility];  Service: Neurosurgery;  Laterality: Right;  Right    MALONEY DILATION N/A 09/20/2014   Procedure: AGAPITO DILATION;  Surgeon: Claudis PENNER Golda, MD;  [facility];  Service: Endoscopy;  Laterality: N/A;   MINOR IRRIGATION AND DEBRIDEMENT OF WOUND Left 02/21/2016   Procedure: MINOR IRRIGATION AND DEBRIDEMENT/REPAIR OF LEFT ARM WOUND;  Surgeon: Marcey Her, MD;  Location: [facility] OR;  Service: Orthopedics;  Laterality: Left;   REVERSE SHOULDER ARTHROPLASTY Right 02/21/2016   Procedure: RIGHT REVERSE TOTAL SHOULDER ARTHROPLASTY;  Surgeon: Marcey Her, MD;  Location: [facility] OR;  Service: Orthopedics;  Laterality: Right;   REVERSE TOTAL SHOULDER ARTHROPLASTY Right 02/21/2016   SAVORY DILATION  03/24/2022   Procedure: SAVORY DILATION;  Surgeon: Eartha Angelia, Sieving, MD;  [facility];  Service: Gastroenterology;;   SHOULDER ARTHROSCOPY W/ ROTATOR  CUFF REPAIR Right 1990s   TOTAL SHOULDER REPLACEMENT     Right   VAGINAL HYSTERECTOMY     Patient Active Problem List   Diagnosis Date Noted   Other spondylosis with radiculopathy, lumbar region 05/22/2024   Chronic back pain 05/09/2024   Elevated LFTs 08/24/2022   Depression 06/18/2022   TIA (transient ischemic attack) 06/17/2022   Chronic pain 06/17/2022   Hyperglycemia 06/17/2022   Anxiety 06/17/2022   Asthma, chronic 06/17/2022   Hypothyroidism 06/17/2022   Restless leg syndrome 06/17/2022   Nausea without vomiting 02/10/2022   Diarrhea 04/09/2021   Pain in right knee 11/23/2019   IBS (irritable bowel syndrome) 06/27/2019   Dysphagia 05/08/2019   Paresthesias 04/27/2019   Hiatal hernia with GERD without esophagitis 11/27/2016   Gastroesophageal reflux disease    Hiatal hernia    S/P shoulder replacement 02/21/2016   DYSPNEA 12/13/2009   CHEST PAIN, ATYPICAL 12/13/2009  PCP: Marvine Rush, MD  REFERRING PROVIDER: Debby Dorn MATSU, MD  REFERRING DIAG:  [phone] (ICD-10-CM) - Other idiopathic scoliosis, lumbar region    Rationale for Evaluation and Treatment: Rehabilitation  THERAPY DIAG:  Scoliosis of lumbar spine, unspecified scoliosis type  Radiculopathy, lumbar region  Difficulty in walking, not elsewhere classified  ONSET DATE: Surgery on September 15th and 18th   SUBJECTIVE:                                                                                                                                                                                           SUBJECTIVE STATEMENT: Patient is a returning patient but had surgery last year.  Pt reports currently having low back pain - more R sided, reports it as sharp pain. She reports some mid back pain as well. Reports some pain with breathing at times. Says she made doctors aware at last visit. Reports she has traveling pain down in legs as well and N/T into feet sometimes.   PERTINENT HISTORY:   POSTERIOR SPINAL FUSION T11-S1 on Sept 18th  PROCEDURE: 1.  T11-12, T12-L1, L1-2, L2-L3, L3-L4, L4-L5, L5-S1, sacrum-ilium posterior arthrodesis 2.  Exploration of previous L4-5 posterior fusion and and removal of previous L4-5 posterior instrumentation 2. Segmental instrumentation with pedicle and sacroiliac screws and rod construct at T11-T12-L1-L2-L3-L4-L5-S1-S2AI   Reports MD released her from wearing TLSO unless doing heavy things  PAIN:  Are you having pain? Yes: NPRS scale: 5-6/10 Pain location: mid-low back back, R hip, feet  Pain description: Soreness, sharp, N/T  Aggravating factors: Putting on socks, bending forward Relieving factors: N/A  PRECAUTIONS: Back, B/L/T precautions. Unsure if she still should be following precautions at this time. Contacted MD office. Unable to reach. Left message.   Update: Spoke with MD Debby Nurse. She confirms no precautions at this point. Use pain as guide.   RED FLAGS: Bilateral N/T into both feet intermittent    WEIGHT BEARING RESTRICTIONS: No  FALLS:  Has patient fallen in last 6 months? Yes. Number of falls 2  LIVING ENVIRONMENT: Lives with: lives with their spouse Lives in: House/apartment Stairs: Yes: External: 3-4 steps; bilateral but cannot reach both Has following equipment at home: Single point cane and Walker - 2 wheeled  OCCUPATION: Retired   PLOF: Needs assistance with ADLs Husband assists with lower body dressing.   PATIENT GOALS: To be able to put on socks/shoes and get back to cleaning   NEXT MD VISIT: March/April 2026  OBJECTIVE:  Note: Objective measures were completed at Evaluation unless otherwise noted.  DIAGNOSTIC FINDINGS:  IMPRESSION: 1. Mild levocurvature of the cervicothoracic junction, approximately 12  degrees. 2. Marginal interval improvement of levoscoliosis of the lumbar spine, although accurate measurement is precluded by new orthopedic hardware.  PATIENT SURVEYS:  Modified Oswestry:   Modified Oswestry Low Back Pain Disability Questionnaire: 21 / 50 = 42.0 %   Interpretation of scores: Score Category Description  0-20% Minimal Disability The patient can cope with most living activities. Usually no treatment is indicated apart from advice on lifting, sitting and exercise  21-40% Moderate Disability The patient experiences more pain and difficulty with sitting, lifting and standing. Travel and social life are more difficult and they may be disabled from work. Personal care, sexual activity and sleeping are not grossly affected, and the patient can usually be managed by conservative means  41-60% Severe Disability Pain remains the main problem in this group, but activities of daily living are affected. These patients require a detailed investigation  61-80% Crippled Back pain impinges on all aspects of the patients life. Positive intervention is required  81-100% Bed-bound These patients are either bed-bound or exaggerating their symptoms  Bluford FORBES Zoe DELENA Karon DELENA, et al. Surgery versus conservative management of stable thoracolumbar fracture: the PRESTO feasibility RCT. [place] ([place]): [employer]; 2021 Nov. [facility] Technology Assessment, No. 25.62.) Appendix 3, Oswestry Disability Index category descriptors. Available from: Findjewelers.cz  Minimally Clinically Important Difference (MCID) = 12.8%  COGNITION: Overall cognitive status: Within functional limits for tasks assessed     SENSATION: Light touch: Impaired  Dec sensation throughout RLE compared to LLE   POSTURE: decreased lumbar lordosis  PALPATION: Not completed at eval   MUSCLE LENGTH: Hamstrings: ~135 deg bilaterally  LUMBAR ROM:   AROM eval  Flexion Fingers to knee level, *low back pain, more pain with return     Extension 50% avail, * into R thigh  Right lateral flexion   Left lateral flexion   Right rotation 50% avail *  Left rotation 50% avail *    (Blank rows = not tested)  *=pain  LOWER EXTREMITY ROM:     Active  Right eval Left eval  Hip flexion    Hip extension    Hip abduction    Hip adduction    Hip internal rotation    Hip external rotation    Knee flexion    Knee extension    Ankle dorsiflexion    Ankle plantarflexion    Ankle inversion    Ankle eversion     (Blank rows = not tested)  LOWER EXTREMITY MMT:    MMT Right eval Left eval  Hip flexion    Hip extension    Hip abduction    Hip adduction    Hip internal rotation    Hip external rotation    Knee flexion    Knee extension    Ankle dorsiflexion    Ankle plantarflexion    Ankle inversion    Ankle eversion     (Blank rows = not tested)   FUNCTIONAL TESTS:  5 times sit to stand: 14 seconds, reports feeling it in her back, mild sharp pain 2 minute walk test: Next session   GAIT: Distance walked: 50 ft in session  Assistive device utilized: None Level of assistance: Complete Independence Comments: WFL  TREATMENT DATE:  10/05/24: PT Eval and HEP  PATIENT EDUCATION:  Education details: PT evaluation, objective findings, POC, Importance of HEP, Precautions, Clinic policies Person educated: Patient Education method: Explanation and Demonstration Education comprehension: verbalized understanding and returned demonstration  HOME EXERCISE PROGRAM: Access Code: [email or web address] URL: [email or web address] Date: 10/05/2024 Prepared by: Rosaria Powell-Butler  Exercises - Hooklying Single Knee to Chest Stretch with Towel  - 2 x daily - 7 x weekly - 3 sets - 10 reps - Supine Piriformis Stretch with Foot on Ground  - 2 x daily - 7 x weekly - 3 sets - 30 hold - Hooklying Hamstring Stretch with Strap  - 2 x daily - 7 x weekly - 3 sets - 30 hold  ASSESSMENT:  CLINICAL IMPRESSION: Patient is a 69 y.o. female who  was seen today for physical therapy evaluation and treatment for  M41.26 (ICD-10-CM) - Other idiopathic scoliosis, lumbar region  . Patient is 19 weeks status post T11-pelvis fusion. On this date, patient demonstrates impaired self perception of function, decreased lumbar ROM, poor posture, tight LE musculature bilaterally, and decreased endurance all of which may be contributing to patient's increased pain, radiating pain, decreased activity tolerance, difficulty with ADLs and impairing their overall function. Patient will benefit from skilled physical therapy to address the above/below deficits in order to improve pain and overall function.    OBJECTIVE IMPAIRMENTS: decreased activity tolerance, decreased endurance, decreased mobility, decreased ROM, decreased strength, hypomobility, impaired perceived functional ability, impaired flexibility, impaired sensation, improper body mechanics, postural dysfunction, and pain.   ACTIVITY LIMITATIONS: carrying, lifting, bending, sitting, standing, squatting, stairs, transfers, bed mobility, and dressing  PARTICIPATION LIMITATIONS: cleaning, laundry, community activity, and yard work  PERSONAL FACTORS: N/A are also affecting patient's functional outcome.   REHAB POTENTIAL: Good  CLINICAL DECISION MAKING: Stable/uncomplicated  EVALUATION COMPLEXITY: Low   GOALS: Goals reviewed with patient? No  SHORT TERM GOALS: Target date: 11/02/24 Patient will be independent with performance of HEP to demonstrate adequate self management of symptoms.  Baseline:  Goal status: INITIAL  2.   Patient will report at least a 50% improvement with function and/or pain reduction overall since beginning PT. Baseline:  Goal status: INITIAL   LONG TERM GOALS: Target date: 11/30/24 Patient will improve Modified Oswestry score by 12.8 % in order to demonstrate improved self-perceived disability and overall function while meeting MCID.  Baseline: Goal status: INITIAL    2.  Patient will improve  5 times sit to stand  test by  at least 4 seconds in order to demonstrate improved LE strength and endurance required for  prolonged ambulation. Baseline:  Goal status: INITIAL   3.  Patient will improve lumbar flexion ROM to be able to reach at least mid shin in order to demonstrate improved low back mobility needed for lower body dressing. Baseline:  Goal status: INITIAL   4.  Patient will report overall 75% improvement since beginning PT. Baseline:  Goal status: INITIAL  5. Patient will improve hamstring mobility by at least 5 degrees bilaterally in order to demonstrate improved LE flexibility needed for lower body dressing.  Baseline:  Goal status: INITIAL   PLAN:  PT FREQUENCY: 2x/week  PT DURATION: 8 weeks  PLANNED INTERVENTIONS: [id number]- PT Re-evaluation, 97110-Therapeutic exercises, 97530- Therapeutic activity, [id number]- Neuromuscular re-education, 97535- Self Care, [id number]- Manual therapy, [id number]- Gait training, [phone]- Electrical stimulation (manual), [id number]- Ultrasound, [id number]- Traction (mechanical), [phone] (1-2 muscles), [id number] (3+ muscles)- Dry Needling, Patient/Family education, Balance training, Stair training, Taping, Joint mobilization, Spinal mobilization, Scar mobilization, Cryotherapy, and Moist heat.  PLAN  FOR NEXT SESSION:  Review HEP and goals, 2 minute walk test, introduce lumbar mobility exercises when appropriate, LE strengthening    11:40 AM, 10/05/24 Rosaria Settler, PT, DPT [facility]   [employer] Auth Request Treatment Start Date: 10/05/24  Referring diagnosis code (ICD 10)?  M41.26 (ICD-10-CM) - Other idiopathic scoliosis, lumbar region   Treatment diagnosis codes (ICD 10)? (if different than referring diagnosis)  M41.9 M54.16 R26.2  What was this (referring dx) caused by? [x]  Surgery []  Fall [x]  Ongoing issue []  Arthritis []  Other: ____________  Laterality: []  Rt []  Lt [x]   Both  Deficits: [x]  Pain [x]  Stiffness []  Weakness []  Edema []  Balance Deficits []  Coordination []  Gait Disturbance [x]  ROM []  Other   Functional Tool Score:  Modified Oswestry Low Back Pain Disability Questionnaire: 21 / 50 = 42.0 % 5 times sit to stand: 14 seconds  CPT codes: See Planned Interventions listed in the Plan section of the Evaluation.   "

## 2024-10-05 ENCOUNTER — Other Ambulatory Visit: Payer: Self-pay

## 2024-10-05 ENCOUNTER — Ambulatory Visit (HOSPITAL_COMMUNITY): Attending: Neurosurgery

## 2024-10-05 DIAGNOSIS — M5416 Radiculopathy, lumbar region: Secondary | ICD-10-CM | POA: Diagnosis present

## 2024-10-05 DIAGNOSIS — R262 Difficulty in walking, not elsewhere classified: Secondary | ICD-10-CM | POA: Insufficient documentation

## 2024-10-05 DIAGNOSIS — M419 Scoliosis, unspecified: Secondary | ICD-10-CM | POA: Diagnosis present

## 2024-10-09 ENCOUNTER — Ambulatory Visit (HOSPITAL_COMMUNITY)

## 2024-10-12 ENCOUNTER — Ambulatory Visit (HOSPITAL_COMMUNITY)

## 2024-10-12 DIAGNOSIS — M5416 Radiculopathy, lumbar region: Secondary | ICD-10-CM

## 2024-10-12 DIAGNOSIS — M419 Scoliosis, unspecified: Secondary | ICD-10-CM

## 2024-10-12 DIAGNOSIS — R262 Difficulty in walking, not elsewhere classified: Secondary | ICD-10-CM

## 2024-10-12 NOTE — Therapy (Signed)
 " OUTPATIENT PHYSICAL THERAPY THORACOLUMBAR TREATMENT   Patient Name: Kathryn [name] MRN: [id number] DOB:08/05/1956, 69 y.o., female Today's Date: 10/12/2024  END OF SESSION:  PT End of Session - 10/12/24 1306     Visit Number 2    Number of Visits 16    Date for Recertification  11/02/24    Authorization Type [employer]    Authorization Time Period 16 visits approved 1/29-2/28    Authorization - Visit Number 2    Authorization - Number of Visits 16    Progress Note Due on Visit 10    PT Start Time 1008    PT Stop Time 1050    PT Time Calculation (min) 42 min    Activity Tolerance Patient tolerated treatment well    Behavior During Therapy [facility] for tasks assessed/performed           Past Medical History:  Diagnosis Date   Adrenal insufficiency    occured following steroid shoulder injections ~ spring 2017   Anxiety    pt. reports that she has RLS- takes Xanax  for calming her self so she can settle & sleep    Arthritis    shoulder & back, knees & leg     Asthma    aleery related   Atypical mole 11/12/2020   mod-severe- mid back   Chest pain, atypical 2020   had cardiac workup - deemed related to anxiety   Chronic bronchitis (HCC)    less since I quit smoking (02/21/2016)   Chronic lower back pain    Dyspnea    episodic with allergen triggers   GERD (gastroesophageal reflux disease)    Guillain-Barre syndrome following vaccination 1970's   post swine flu shot, hosp. for treatment for 1 month    H/O dizziness    H/O hiatal hernia    Hepatitis    hepatitis as a child; got it from a little girl at school make sick and vomiting   Herpes simplex    History of Guillain-Barre syndrome ~ 1978   from the swine flu shot   Hypothyroidism    IBS (irritable bowel syndrome)    Pneumonia    PONV (postoperative nausea and vomiting)    Restless leg syndrome    TIA (transient ischemic attack)    Walking pneumonia ~ 2011   Past Surgical History:  Procedure  Laterality Date   69 HOUR PH STUDY N/A 09/23/2016   Procedure: 24 HOUR PH STUDY;  Surgeon: Gustav Shila GAILS, MD;  Location: [facility];  Service: Endoscopy;  Laterality: N/A;   ABDOMINAL EXPOSURE N/A 05/22/2024   Procedure: ABDOMINAL EXPOSURE;  Surgeon: Gretta Lonni PARAS, MD;  Location: [facility] OR;  Service: Vascular;  Laterality: N/A;   ANTERIOR CERVICAL DECOMP/DISCECTOMY FUSION  2000 & 2015   two times ago- C5 and C6, Dr. Lynwood JONELLE Mill, MD 2015   ANTERIOR LAT LUMBAR FUSION Right 05/22/2024   Procedure: RIGHT DIRECT ANTERIOR LATERAL LUMBAR FUSION LUMBAR ONE-LUMBAR TWO, LUMBAR TWO-LUMBAR THREE, LUMBAR THREE-LUMBAR FOUR, LUMBAR TWO-LUMBAR THREE ANTEROLATERAL PLATE;  Surgeon: Debby Dorn MATSU, MD;  Location: [facility] OR;  Service: Neurosurgery;  Laterality: Right;  RT, L12, L23, L34 DLIF   ANTERIOR LUMBAR FUSION N/A 05/22/2024   Procedure: ANTERIOR LUMBAR FUSION LUMBAR FIVE- SACRAL ONE;  Surgeon: Debby Dorn MATSU, MD;  Location: [facility] OR;  Service: Neurosurgery;  Laterality: N/A;  L5-S1 ALIF   APPENDECTOMY  1980s   BACK SURGERY     BIOPSY  03/24/2022   Procedure: BIOPSY;  Surgeon: Eartha Flavors, Toribio, MD;  Location: [facility];  Service: Gastroenterology;;   BREAST BIOPSY Right    benign   CHOLECYSTECTOMY OPEN  1980s   COLONOSCOPY N/A 09/20/2014   rehman: Prep was excellent except she had thick layer of stool coating cecal and ascending colon mucosa. Cecal landmarks were well identified after vigorous washing. Small lymphoid polyp ablated via cold biopsy from appendiceal stump.Mucosa of rest of the colon was normal. Normal mucosa of rectum and anorectal junction.   ESOPHAGEAL DILATION N/A 05/29/2019   Procedure: ESOPHAGEAL DILATION;  Surgeon: Golda Claudis PENNER, MD;  [facility]: [facility];  Service: Endoscopy;  Laterality: N/A;   ESOPHAGEAL MANOMETRY N/A 09/23/2016   Procedure: ESOPHAGEAL MANOMETRY (EM);  Surgeon: Gustav Shila GAILS, MD;  Location: [facility];  Service: Endoscopy;   Laterality: N/A;   ESOPHAGOGASTRODUODENOSCOPY N/A 09/20/2014   Procedure: ESOPHAGOGASTRODUODENOSCOPY (EGD);  Surgeon: Claudis PENNER Golda, MD;  [facility];  Service: Endoscopy;  Laterality: N/A;   ESOPHAGOGASTRODUODENOSCOPY (EGD) WITH PROPOFOL  N/A 05/29/2019   - Z-line, 38 cm from the incisors.   ESOPHAGOGASTRODUODENOSCOPY (EGD) WITH PROPOFOL  N/A 03/24/2022   Procedure: ESOPHAGOGASTRODUODENOSCOPY (EGD) WITH PROPOFOL ;  Surgeon: Eartha Flavors Toribio, MD;  [facility];  Service: Gastroenterology;  Laterality: N/A;  730 ASA 1   HIATAL HERNIA REPAIR N/A 11/27/2016   Procedure: LAPAROSCOPIC REPAIR OF HIATAL HERNIA WITH NISSEN FUNDOPLICATION;  Surgeon: Krystal Russell, MD;  Location: [facility];  Service: General;  Laterality: N/A;   INCISION AND DRAINAGE OF WOUND Left 02/21/2016   forearm, minor   IR ANGIO INTRA EXTRACRAN SEL INTERNAL CAROTID BILAT MOD SED  11/03/2022   IR ANGIO VERTEBRAL SEL VERTEBRAL UNI L MOD SED  11/03/2022   LUMBAR LAMINECTOMY/DECOMPRESSION MICRODISCECTOMY Right 06/22/2013   Procedure: RIGHT LUMBAR TWO-THREE DISCECTOMY;  Surgeon: Lynwood JONELLE Mill, MD;  Location: [facility];  Service: Neurosurgery;  Laterality: Right;  Right    MALONEY DILATION N/A 09/20/2014   Procedure: AGAPITO DILATION;  Surgeon: Claudis PENNER Golda, MD;  [facility];  Service: Endoscopy;  Laterality: N/A;   MINOR IRRIGATION AND DEBRIDEMENT OF WOUND Left 02/21/2016   Procedure: MINOR IRRIGATION AND DEBRIDEMENT/REPAIR OF LEFT ARM WOUND;  Surgeon: Marcey Her, MD;  Location: [facility] OR;  Service: Orthopedics;  Laterality: Left;   REVERSE SHOULDER ARTHROPLASTY Right 02/21/2016   Procedure: RIGHT REVERSE TOTAL SHOULDER ARTHROPLASTY;  Surgeon: Marcey Her, MD;  Location: [facility] OR;  Service: Orthopedics;  Laterality: Right;   REVERSE TOTAL SHOULDER ARTHROPLASTY Right 02/21/2016   SAVORY DILATION  03/24/2022   Procedure: SAVORY DILATION;  Surgeon: Eartha Flavors, Toribio, MD;  [facility];  Service: Gastroenterology;;   SHOULDER ARTHROSCOPY W/ ROTATOR CUFF REPAIR Right 1990s   TOTAL SHOULDER REPLACEMENT     Right   VAGINAL HYSTERECTOMY     Patient Active Problem List   Diagnosis Date Noted   Other spondylosis with radiculopathy, lumbar region 05/22/2024   Chronic back pain 05/09/2024   Elevated LFTs 08/24/2022   Depression 06/18/2022   TIA (transient ischemic attack) 06/17/2022   Chronic pain 06/17/2022   Hyperglycemia 06/17/2022   Anxiety 06/17/2022   Asthma, chronic 06/17/2022   Hypothyroidism 06/17/2022   Restless leg syndrome 06/17/2022   Nausea without vomiting 02/10/2022   Diarrhea 04/09/2021   Pain in right knee 11/23/2019   IBS (irritable bowel syndrome) 06/27/2019   Dysphagia 05/08/2019   Paresthesias 04/27/2019   Hiatal hernia with GERD without esophagitis 11/27/2016   Gastroesophageal reflux disease  Hiatal hernia    S/P shoulder replacement 02/21/2016   DYSPNEA 12/13/2009   CHEST PAIN, ATYPICAL 12/13/2009    PCP: Marvine Rush, MD  REFERRING PROVIDER: Debby Dorn MATSU, MD  REFERRING DIAG:  [phone] (ICD-10-CM) - Other idiopathic scoliosis, lumbar region    Rationale for Evaluation and Treatment: Rehabilitation  THERAPY DIAG:  Scoliosis of lumbar spine, unspecified scoliosis type  Radiculopathy, lumbar region  Difficulty in walking, not elsewhere classified  ONSET DATE: Surgery on September 15th and 18th   SUBJECTIVE:                                                                                                                                                                                           SUBJECTIVE STATEMENT: Informed pt of lifted BLT precautions per MD (verbally).  PT reports she is not having any pain currently.  States her Lt foot and LB feels numb.  Only having pain is waves,mostly sharp shooting pains Rt>Lt lower back.  States she has not been able to do much walking due to the snow but has been doing her  HEP.   Evaluation;  Patient is a returning patient but had surgery last year.  Pt reports currently having low back pain - more R sided, reports it as sharp pain. She reports some mid back pain as well. Reports some pain with breathing at times. Says she made doctors aware at last visit. Reports she has traveling pain down in legs as well and N/T into feet sometimes.   PERTINENT HISTORY:  POSTERIOR SPINAL FUSION T11-S1 on Sept 18th  PROCEDURE: 1.  T11-12, T12-L1, L1-2, L2-L3, L3-L4, L4-L5, L5-S1, sacrum-ilium posterior arthrodesis 2.  Exploration of previous L4-5 posterior fusion and and removal of previous L4-5 posterior instrumentation 2. Segmental instrumentation with pedicle and sacroiliac screws and rod construct at T11-T12-L1-L2-L3-L4-L5-S1-S2AI   Reports MD released her from wearing TLSO unless doing heavy things  PAIN:  Are you having pain? Yes: NPRS scale: 5-6/10 Pain location: mid-low back back, R hip, feet  Pain description: Soreness, sharp, N/T  Aggravating factors: Putting on socks, bending forward Relieving factors: N/A  PRECAUTIONS: Back, B/L/T precautions. Unsure if she still should be following precautions at this time. Contacted MD office. Unable to reach. Left message.   Update: Spoke with MD Debby Nurse. She confirms no precautions at this point. Use pain as guide.   RED FLAGS: Bilateral N/T into both feet intermittent    WEIGHT BEARING RESTRICTIONS: No  FALLS:  Has patient fallen in last 6 months? Yes. Number of falls 2  LIVING ENVIRONMENT: Lives with: lives with their spouse Lives in: House/apartment Stairs: Yes:  External: 3-4 steps; bilateral but cannot reach both Has following equipment at home: Single point cane and Walker - 2 wheeled  OCCUPATION: Retired   PLOF: Needs assistance with ADLs Husband assists with lower body dressing.   PATIENT GOALS: To be able to put on socks/shoes and get back to cleaning   NEXT MD VISIT: March/April  2026  OBJECTIVE:  Note: Objective measures were completed at Evaluation unless otherwise noted.  DIAGNOSTIC FINDINGS:  IMPRESSION: 1. Mild levocurvature of the cervicothoracic junction, approximately 12 degrees. 2. Marginal interval improvement of levoscoliosis of the lumbar spine, although accurate measurement is precluded by new orthopedic hardware.  PATIENT SURVEYS:  Modified Oswestry:  Modified Oswestry Low Back Pain Disability Questionnaire: 21 / 50 = 42.0 %   Interpretation of scores: Score Category Description  0-20% Minimal Disability The patient can cope with most living activities. Usually no treatment is indicated apart from advice on lifting, sitting and exercise  21-40% Moderate Disability The patient experiences more pain and difficulty with sitting, lifting and standing. Travel and social life are more difficult and they may be disabled from work. Personal care, sexual activity and sleeping are not grossly affected, and the patient can usually be managed by conservative means  41-60% Severe Disability Pain remains the main problem in this group, but activities of daily living are affected. These patients require a detailed investigation  61-80% Crippled Back pain impinges on all aspects of the patients life. Positive intervention is required  81-100% Bed-bound These patients are either bed-bound or exaggerating their symptoms  Bluford FORBES Zoe DELENA Karon DELENA, et al. Surgery versus conservative management of stable thoracolumbar fracture: the PRESTO feasibility RCT. [place] ([place]): [employer]; 2021 Nov. [facility] Technology Assessment, No. 25.62.) Appendix 3, Oswestry Disability Index category descriptors. Available from: Findjewelers.cz  Minimally Clinically Important Difference (MCID) = 12.8%  COGNITION: Overall cognitive status: Within functional limits for tasks assessed     SENSATION: Light touch: Impaired  Dec sensation  throughout RLE compared to LLE   POSTURE: decreased lumbar lordosis  PALPATION: Not completed at eval   MUSCLE LENGTH: Hamstrings: ~135 deg bilaterally  LUMBAR ROM:   AROM eval  Flexion Fingers to knee level, *low back pain, more pain with return     Extension 50% avail, * into R thigh  Right lateral flexion   Left lateral flexion   Right rotation 50% avail *  Left rotation 50% avail *   (Blank rows = not tested)  *=pain   LOWER EXTREMITY MMT:    MMT Right eval Left eval  Hip flexion    Hip extension    Hip abduction    Hip adduction    Hip internal rotation    Hip external rotation    Knee flexion    Knee extension    Ankle dorsiflexion    Ankle plantarflexion    Ankle inversion    Ankle eversion     (Blank rows = not tested)   FUNCTIONAL TESTS:  5 times sit to stand: 14 seconds, reports feeling it in her back, mild sharp pain 2 minute walk test: Next session  10/12/24:  450 feet no AD  GAIT: Distance walked: 50 ft in session  Assistive device utilized: None Level of assistance: Complete Independence Comments: WFL  TREATMENT DATE:  2.5.2026 : 450 feet no AD Goal review Supine:  abdominal isometrics 10X  SKTC 10X20: holds  Piriformis stretch 3X30 each Long sitting hamstring stretch 60 each side Sit to stands 10X no  UE Proper lifting techniques, bending over to scrub toilet, golfers lift for item retrieval     10/05/24: PT Eval and HEP                                                                                                                                 PATIENT EDUCATION:  Education details: PT evaluation, objective findings, POC, Importance of HEP, Precautions, Clinic policies Person educated: Patient Education method: Explanation and Demonstration Education comprehension: verbalized understanding and returned demonstration  HOME EXERCISE PROGRAM: Access Code: [email or web address] URL: [email or web address] Date:  10/05/2024 Prepared by: Rosaria Powell-Butler Exercises - Hooklying Single Knee to Chest Stretch with Towel  - 2 x daily - 7 x weekly - 3 sets - 10 reps - Supine Piriformis Stretch with Foot on Ground  - 2 x daily - 7 x weekly - 3 sets - 30 hold - Hooklying Hamstring Stretch with Strap  - 2 x daily - 7 x weekly - 3 sets - 30 hold  Access Code: [email or web address] URL: [email or web address] Date: 10/12/2024 Prepared by: Greig Fuse Exercises - Seated Table Hamstring Stretch  - 2 x daily - 7 x weekly - 3 reps - 30 sec hold - Supine Transversus Abdominis Bracing - Hands on Stomach  - 2 x daily - 7 x weekly - 10 reps - 5 sec hold - Sit to Stand  - 2 x daily - 7 x weekly - 10 reps Patient Education - Garment/textile Technologist - Lifting Techniques  ASSESSMENT:  CLINICAL IMPRESSION: Reviewed goals and POC moving forward.  2 MWT completed at beginning of session.  Pt was able to complete 2 full laps around clinic and without pain.  Began core stabilization and educated on importance of recruiting core with all exercises and activities.  Informed of precautions being lifted and educated on proper lifting and squatting techniques.  Encouraged to use golfers lift, keep arch in back to avoid rounding over, staying close to object, wide BOS and using Legs.  Pt was able to demonstrate correctly.  Also added these to medbridge and printing out instruction.  Alternate hamstring stretch also instructed and added to HEP as pt has extreme tightness in hamstrings.  Pt with additional questions answered by therapist today.   Pt Patient will continue to benefit from skilled physical therapy to address the above/below deficits in order to improve pain and overall function.    OBJECTIVE IMPAIRMENTS: decreased activity tolerance, decreased endurance, decreased mobility, decreased ROM, decreased strength, hypomobility, impaired perceived functional ability, impaired flexibility, impaired sensation, improper body mechanics,  postural dysfunction, and pain.   ACTIVITY LIMITATIONS: carrying, lifting, bending, sitting, standing, squatting, stairs, transfers, bed mobility, and dressing  PARTICIPATION LIMITATIONS: cleaning, laundry, community activity, and yard work  PERSONAL FACTORS: N/A are also affecting patient's functional outcome.   REHAB POTENTIAL: Good  CLINICAL DECISION MAKING: Stable/uncomplicated  EVALUATION COMPLEXITY: Low   GOALS: Goals reviewed with patient? No  SHORT TERM GOALS: Target  date: 11/02/24 Patient will be independent with performance of HEP to demonstrate adequate self management of symptoms.  Baseline:  Goal status: INITIAL  2.   Patient will report at least a 50% improvement with function and/or pain reduction overall since beginning PT. Baseline:  Goal status: INITIAL   LONG TERM GOALS: Target date: 11/30/24 Patient will improve Modified Oswestry score by 12.8 % in order to demonstrate improved self-perceived disability and overall function while meeting MCID.  Baseline: Goal status: INITIAL   2.  Patient will improve  5 times sit to stand  test by  at least 4 seconds in order to demonstrate improved LE strength and endurance required for  prolonged ambulation. Baseline:  Goal status: INITIAL   3.  Patient will improve lumbar flexion ROM to be able to reach at least mid shin in order to demonstrate improved low back mobility needed for lower body dressing. Baseline:  Goal status: INITIAL   4.  Patient will report overall 75% improvement since beginning PT. Baseline:  Goal status: INITIAL  5. Patient will improve hamstring mobility by at least 5 degrees bilaterally in order to demonstrate improved LE flexibility needed for lower body dressing.  Baseline:  Goal status: INITIAL   PLAN:  PT FREQUENCY: 2x/week  PT DURATION: 8 weeks  PLANNED INTERVENTIONS: 97164- PT Re-evaluation, 97110-Therapeutic exercises, 97530- Therapeutic activity, [id number]- Neuromuscular  re-education, 97535- Self Care, 02859- Manual therapy, [id number]- Gait training, [phone]- Electrical stimulation (manual), [id number]- Ultrasound, [id number]- Traction (mechanical), [phone] (1-2 muscles), 20561 (3+ muscles)- Dry Needling, Patient/Family education, Balance training, Stair training, Taping, Joint mobilization, Spinal mobilization, Scar mobilization, Cryotherapy, and Moist heat.  PLAN FOR NEXT SESSION:  Introduce lumbar mobility exercises when appropriate, LE strengthening   Greig KATHEE Fuse, PTA/CLT [facility] Ph: [phone]  1:08 PM, 10/12/24    "

## 2024-10-13 ENCOUNTER — Ambulatory Visit (HOSPITAL_COMMUNITY)

## 2024-10-13 ENCOUNTER — Encounter (HOSPITAL_COMMUNITY): Payer: Self-pay

## 2024-10-13 DIAGNOSIS — M5416 Radiculopathy, lumbar region: Secondary | ICD-10-CM

## 2024-10-13 DIAGNOSIS — M419 Scoliosis, unspecified: Secondary | ICD-10-CM

## 2024-10-13 DIAGNOSIS — R262 Difficulty in walking, not elsewhere classified: Secondary | ICD-10-CM

## 2024-10-13 NOTE — Therapy (Signed)
 " OUTPATIENT PHYSICAL THERAPY THORACOLUMBAR TREATMENT   Patient Name: Kathryn [name] MRN: [id number] DOB:[birth year], 69 y.o., female Today's Date: 10/13/2024  END OF SESSION:  PT End of Session - 10/13/24 0728     Visit Number 3    Number of Visits 16    Date for Recertification  11/02/24    Authorization Type [employer]    Authorization Time Period 16 visits approved 1/29-2/28    Authorization - Visit Number 3    Authorization - Number of Visits 16    Progress Note Due on Visit 10    PT Start Time 0730    PT Stop Time 0813    PT Time Calculation (min) 43 min    Activity Tolerance Patient tolerated treatment well    Behavior During Therapy [facility] for tasks assessed/performed           Past Medical History:  Diagnosis Date   Adrenal insufficiency    occured following steroid shoulder injections ~ spring 2017   Anxiety    pt. reports that she has RLS- takes Xanax  for calming her self so she can settle & sleep    Arthritis    shoulder & back, knees & leg     Asthma    aleery related   Atypical mole 11/12/2020   mod-severe- mid back   Chest pain, atypical 2020   had cardiac workup - deemed related to anxiety   Chronic bronchitis (HCC)    less since I quit smoking (02/21/2016)   Chronic lower back pain    Dyspnea    episodic with allergen triggers   GERD (gastroesophageal reflux disease)    Guillain-Barre syndrome following vaccination 1970's   post swine flu shot, hosp. for treatment for 1 month    H/O dizziness    H/O hiatal hernia    Hepatitis    hepatitis as a child; got it from a little girl at school make sick and vomiting   Herpes simplex    History of Guillain-Barre syndrome ~ 1978   from the swine flu shot   Hypothyroidism    IBS (irritable bowel syndrome)    Pneumonia    PONV (postoperative nausea and vomiting)    Restless leg syndrome    TIA (transient ischemic attack)    Walking pneumonia ~ 2011   Past Surgical History:  Procedure  Laterality Date   79 HOUR PH STUDY N/A 09/23/2016   Procedure: 24 HOUR PH STUDY;  Surgeon: Gustav Shila GAILS, MD;  Location: [facility];  Service: Endoscopy;  Laterality: N/A;   ABDOMINAL EXPOSURE N/A 05/22/2024   Procedure: ABDOMINAL EXPOSURE;  Surgeon: Gretta Lonni PARAS, MD;  Location: [facility] OR;  Service: Vascular;  Laterality: N/A;   ANTERIOR CERVICAL DECOMP/DISCECTOMY FUSION  2000 & 2015   two times ago- C5 and C6, Dr. Lynwood JONELLE Mill, MD 2015   ANTERIOR LAT LUMBAR FUSION Right 05/22/2024   Procedure: RIGHT DIRECT ANTERIOR LATERAL LUMBAR FUSION LUMBAR ONE-LUMBAR TWO, LUMBAR TWO-LUMBAR THREE, LUMBAR THREE-LUMBAR FOUR, LUMBAR TWO-LUMBAR THREE ANTEROLATERAL PLATE;  Surgeon: Debby Dorn MATSU, MD;  Location: [facility] OR;  Service: Neurosurgery;  Laterality: Right;  RT, L12, L23, L34 DLIF   ANTERIOR LUMBAR FUSION N/A 05/22/2024   Procedure: ANTERIOR LUMBAR FUSION LUMBAR FIVE- SACRAL ONE;  Surgeon: Debby Dorn MATSU, MD;  [facility];  Service: Neurosurgery;  Laterality: N/A;  L5-S1 ALIF   APPENDECTOMY  1980s   BACK SURGERY     BIOPSY  03/24/2022   Procedure: BIOPSY;  Surgeon: Eartha Flavors, Toribio, MD;  Location: [facility];  Service: Gastroenterology;;   BREAST BIOPSY Right    benign   CHOLECYSTECTOMY OPEN  1980s   COLONOSCOPY N/A 09/20/2014   rehman: Prep was excellent except she had thick layer of stool coating cecal and ascending colon mucosa. Cecal landmarks were well identified after vigorous washing. Small lymphoid polyp ablated via cold biopsy from appendiceal stump.Mucosa of rest of the colon was normal. Normal mucosa of rectum and anorectal junction.   ESOPHAGEAL DILATION N/A 05/29/2019   Procedure: ESOPHAGEAL DILATION;  Surgeon: Golda Claudis PENNER, MD;  [facility];  Service: Endoscopy;  Laterality: N/A;   ESOPHAGEAL MANOMETRY N/A 09/23/2016   Procedure: ESOPHAGEAL MANOMETRY (EM);  Surgeon: Gustav Shila GAILS, MD;  Location: [facility];  Service: Endoscopy;   Laterality: N/A;   ESOPHAGOGASTRODUODENOSCOPY N/A 09/20/2014   Procedure: ESOPHAGOGASTRODUODENOSCOPY (EGD);  Surgeon: Claudis PENNER Golda, MD;  [facility];  Service: Endoscopy;  Laterality: N/A;   ESOPHAGOGASTRODUODENOSCOPY (EGD) WITH PROPOFOL  N/A 05/29/2019   - Z-line, 38 cm from the incisors.   ESOPHAGOGASTRODUODENOSCOPY (EGD) WITH PROPOFOL  N/A 03/24/2022   Procedure: ESOPHAGOGASTRODUODENOSCOPY (EGD) WITH PROPOFOL ;  Surgeon: Eartha Flavors Toribio, MD;  [facility];  Service: Gastroenterology;  Laterality: N/A;  730 ASA 1   HIATAL HERNIA REPAIR N/A 11/27/2016   Procedure: LAPAROSCOPIC REPAIR OF HIATAL HERNIA WITH NISSEN FUNDOPLICATION;  Surgeon: Krystal Russell, MD;  Location: [facility];  Service: General;  Laterality: N/A;   INCISION AND DRAINAGE OF WOUND Left 02/21/2016   forearm, minor   IR ANGIO INTRA EXTRACRAN SEL INTERNAL CAROTID BILAT MOD SED  11/03/2022   IR ANGIO VERTEBRAL SEL VERTEBRAL UNI L MOD SED  11/03/2022   LUMBAR LAMINECTOMY/DECOMPRESSION MICRODISCECTOMY Right 06/22/2013   Procedure: RIGHT LUMBAR TWO-THREE DISCECTOMY;  Surgeon: Lynwood JONELLE Mill, MD;  [facility]: [facility];  Service: Neurosurgery;  Laterality: Right;  Right    MALONEY DILATION N/A 09/20/2014   Procedure: AGAPITO DILATION;  Surgeon: Claudis PENNER Golda, MD;  [facility];  Service: Endoscopy;  Laterality: N/A;   MINOR IRRIGATION AND DEBRIDEMENT OF WOUND Left 02/21/2016   Procedure: MINOR IRRIGATION AND DEBRIDEMENT/REPAIR OF LEFT ARM WOUND;  Surgeon: Marcey Her, MD;  Location: [facility] OR;  Service: Orthopedics;  Laterality: Left;   REVERSE SHOULDER ARTHROPLASTY Right 02/21/2016   Procedure: RIGHT REVERSE TOTAL SHOULDER ARTHROPLASTY;  Surgeon: Marcey Her, MD;  Location[facility] OR;  Service: Orthopedics;  Laterality: Right;   REVERSE TOTAL SHOULDER ARTHROPLASTY Right 02/21/2016   SAVORY DILATION  03/24/2022   Procedure: SAVORY DILATION;  Surgeon: Eartha Flavors, Toribio, MD;  [facility];  Service: Gastroenterology;;   SHOULDER ARTHROSCOPY W/ ROTATOR CUFF REPAIR Right 1990s   TOTAL SHOULDER REPLACEMENT     Right   VAGINAL HYSTERECTOMY     Patient Active Problem List   Diagnosis Date Noted   Other spondylosis with radiculopathy, lumbar region 05/22/2024   Chronic back pain 05/09/2024   Elevated LFTs 08/24/2022   Depression 06/18/2022   TIA (transient ischemic attack) 06/17/2022   Chronic pain 06/17/2022   Hyperglycemia 06/17/2022   Anxiety 06/17/2022   Asthma, chronic 06/17/2022   Hypothyroidism 06/17/2022   Restless leg syndrome 06/17/2022   Nausea without vomiting 02/10/2022   Diarrhea 04/09/2021   Pain in right knee 11/23/2019   IBS (irritable bowel syndrome) 06/27/2019   Dysphagia 05/08/2019   Paresthesias 04/27/2019   Hiatal hernia with GERD without esophagitis 11/27/2016   Gastroesophageal reflux disease  Hiatal hernia    S/P shoulder replacement 02/21/2016   DYSPNEA 12/13/2009   CHEST PAIN, ATYPICAL 12/13/2009    PCP: Marvine Rush, MD  REFERRING PROVIDER: Debby Dorn MATSU, MD  REFERRING DIAG:  [phone] (ICD-10-CM) - Other idiopathic scoliosis, lumbar region    Rationale for Evaluation and Treatment: Rehabilitation  THERAPY DIAG:  Scoliosis of lumbar spine, unspecified scoliosis type  Radiculopathy, lumbar region  Difficulty in walking, not elsewhere classified  ONSET DATE: Surgery on September 15th and 18th   SUBJECTIVE:                                                                                                                                                                                           SUBJECTIVE STATEMENT: Reports lower back is sore, annoying pain with numbness in feet and lower back.  Exercises are going well.  Has been trying to activate core when lifting objects   Evaluation;  Patient is a returning patient but had surgery last year.  Pt reports currently having low back pain - more R sided, reports  it as sharp pain. She reports some mid back pain as well. Reports some pain with breathing at times. Says she made doctors aware at last visit. Reports she has traveling pain down in legs as well and N/T into feet sometimes.   PERTINENT HISTORY:  POSTERIOR SPINAL FUSION T11-S1 on Sept 18th  PROCEDURE: 1.  T11-12, T12-L1, L1-2, L2-L3, L3-L4, L4-L5, L5-S1, sacrum-ilium posterior arthrodesis 2.  Exploration of previous L4-5 posterior fusion and and removal of previous L4-5 posterior instrumentation 2. Segmental instrumentation with pedicle and sacroiliac screws and rod construct at T11-T12-L1-L2-L3-L4-L5-S1-S2AI   Reports MD released her from wearing TLSO unless doing heavy things  PAIN:  Are you having pain? Yes: NPRS scale: 5-6/10 Pain location: mid-low back back, R hip, feet  Pain description: Soreness, sharp, N/T  Aggravating factors: Putting on socks, bending forward Relieving factors: N/A  PRECAUTIONS: Back, B/L/T precautions. Unsure if she still should be following precautions at this time. Contacted MD office. Unable to reach. Left message.   Update: Spoke with MD Debby Nurse. She confirms no precautions at this point. Use pain as guide.   RED FLAGS: Bilateral N/T into both feet intermittent    WEIGHT BEARING RESTRICTIONS: No  FALLS:  Has patient fallen in last 6 months? Yes. Number of falls 2  LIVING ENVIRONMENT: Lives with: lives with their spouse Lives in: House/apartment Stairs: Yes: External: 3-4 steps; bilateral but cannot reach both Has following equipment at home: Single point cane and Walker - 2 wheeled  OCCUPATION: Retired   PLOF: Needs assistance with ADLs Husband  assists with lower body dressing.   PATIENT GOALS: To be able to put on socks/shoes and get back to cleaning   NEXT MD VISIT: Beginning with April 2026  OBJECTIVE:  Note: Objective measures were completed at Evaluation unless otherwise noted.  DIAGNOSTIC FINDINGS:  IMPRESSION: 1. Mild  levocurvature of the cervicothoracic junction, approximately 12 degrees. 2. Marginal interval improvement of levoscoliosis of the lumbar spine, although accurate measurement is precluded by new orthopedic hardware.  PATIENT SURVEYS:  Modified Oswestry:  Modified Oswestry Low Back Pain Disability Questionnaire: 21 / 50 = 42.0 %   Interpretation of scores: Score Category Description  0-20% Minimal Disability The patient can cope with most living activities. Usually no treatment is indicated apart from advice on lifting, sitting and exercise  21-40% Moderate Disability The patient experiences more pain and difficulty with sitting, lifting and standing. Travel and social life are more difficult and they may be disabled from work. Personal care, sexual activity and sleeping are not grossly affected, and the patient can usually be managed by conservative means  41-60% Severe Disability Pain remains the main problem in this group, but activities of daily living are affected. These patients require a detailed investigation  61-80% Crippled Back pain impinges on all aspects of the patients life. Positive intervention is required  81-100% Bed-bound These patients are either bed-bound or exaggerating their symptoms  Bluford FORBES Zoe DELENA Karon DELENA, et al. Surgery versus conservative management of stable thoracolumbar fracture: the PRESTO feasibility RCT. [place] ([place]): [employer]; 2021 Nov. [facility] Technology Assessment, No. 25.62.) Appendix 3, Oswestry Disability Index category descriptors. Available from: Findjewelers.cz  Minimally Clinically Important Difference (MCID) = 12.8%  COGNITION: Overall cognitive status: Within functional limits for tasks assessed     SENSATION: Light touch: Impaired  Dec sensation throughout RLE compared to LLE   POSTURE: decreased lumbar lordosis  PALPATION: Not completed at eval   MUSCLE LENGTH: Hamstrings: ~135 deg  bilaterally  LUMBAR ROM:   AROM eval  Flexion Fingers to knee level, *low back pain, more pain with return     Extension 50% avail, * into R thigh  Right lateral flexion   Left lateral flexion   Right rotation 50% avail *  Left rotation 50% avail *   (Blank rows = not tested)  *=pain   LOWER EXTREMITY MMT:    MMT Right eval Left eval  Hip flexion    Hip extension    Hip abduction    Hip adduction    Hip internal rotation    Hip external rotation    Knee flexion    Knee extension    Ankle dorsiflexion    Ankle plantarflexion    Ankle inversion    Ankle eversion     (Blank rows = not tested)   FUNCTIONAL TESTS:  5 times sit to stand: 14 seconds, reports feeling it in her back, mild sharp pain 2 minute walk test: Next session  10/12/24:  450 feet no AD  GAIT: Distance walked: 50 ft in session  Assistive device utilized: None Level of assistance: Complete Independence Comments: WFL  TREATMENT DATE:  10/13/24: Nustep UE/LE x 5 min SPM Supine: reviewed log rolling  - Instructed core sets with exhalation to improving breathing with ab sets as tendency to hold breath  - Marching with ab sets 10x5  - Bridge 10x  - LTR 10x 10  - Hamstring stretch 3x 30 Sidelying:  - Clam with GTB 10x 5 Standing:  - squats witih  ab set 10x     2.5.2026 : 450 feet no AD Goal review Supine:  abdominal isometrics 10X  SKTC 10X20: holds  Piriformis stretch 3X30 each Long sitting hamstring stretch 60 each side Sit to stands 10X no UE Proper lifting techniques, bending over to scrub toilet, golfers lift for item retrieval     10/05/24: PT Eval and HEP                                                                                                                                 PATIENT EDUCATION:  Education details: PT evaluation, objective findings, POC, Importance of HEP, Precautions, Clinic policies Person educated: Patient Education method: Explanation and  Demonstration Education comprehension: verbalized understanding and returned demonstration  HOME EXERCISE PROGRAM: Access Code: [email or web address] URL: [email or web address] Date: 10/05/2024 Prepared by: Rosaria Powell-Butler Exercises - Hooklying Single Knee to Chest Stretch with Towel  - 2 x daily - 7 x weekly - 3 sets - 10 reps - Supine Piriformis Stretch with Foot on Ground  - 2 x daily - 7 x weekly - 3 sets - 30 hold - Hooklying Hamstring Stretch with Strap  - 2 x daily - 7 x weekly - 3 sets - 30 hold  Access Code: [email or web address] URL: [email or web address] Date: 10/12/2024 Prepared by: Greig Fuse Exercises - Seated Table Hamstring Stretch  - 2 x daily - 7 x weekly - 3 reps - 30 sec hold - Supine Transversus Abdominis Bracing - Hands on Stomach  - 2 x daily - 7 x weekly - 10 reps - 5 sec hold - Sit to Stand  - 2 x daily - 7 x weekly - 10 reps Patient Education - Garment/textile Technologist - Lifting Techniques  10/13/24: - Supine Bridge  - 2 x daily - 7 x weekly - 2 sets - 10 reps - 5 hold - Squat with Chair Touch  - 2 x daily - 7 x weekly - 1 sets - 10 reps - 3 hold - Supine Lower Trunk Rotation  - 2 x daily - 7 x weekly - 1 sets - 10 reps - 10 hold   ASSESSMENT:  CLINICAL IMPRESSION: Began session on Nustep dynamic warm up following reports of soreness.  Session focus with core stability exercises and proximal strengthening with additional exercises that was tolerated well.  Reviewed mechanics with squats for functional strengthening with min cueing for core activation prior movements.  Pt presents with very tight hamstrings, encouraged to hold stretches at least 30 to a minute to assist with mobility.  Pt limited by general soreness at EOS with 2 session back to back this week, no reports of increased pain.  Added mobilty stretch and gluteal strengthening to HEP with printout given and verbalized understanding.  OBJECTIVE IMPAIRMENTS: decreased activity tolerance, decreased  endurance, decreased mobility, decreased ROM, decreased strength, hypomobility, impaired perceived functional ability, impaired flexibility, impaired sensation, improper body mechanics, postural dysfunction, and pain.  ACTIVITY LIMITATIONS: carrying, lifting, bending, sitting, standing, squatting, stairs, transfers, bed mobility, and dressing  PARTICIPATION LIMITATIONS: cleaning, laundry, community activity, and yard work  PERSONAL FACTORS: N/A are also affecting patient's functional outcome.   REHAB POTENTIAL: Good  CLINICAL DECISION MAKING: Stable/uncomplicated  EVALUATION COMPLEXITY: Low   GOALS: Goals reviewed with patient? No  SHORT TERM GOALS: Target date: 11/02/24 Patient will be independent with performance of HEP to demonstrate adequate self management of symptoms.  Baseline:  Goal status: INITIAL  2.   Patient will report at least a 50% improvement with function and/or pain reduction overall since beginning PT. Baseline:  Goal status: INITIAL   LONG TERM GOALS: Target date: 11/30/24 Patient will improve Modified Oswestry score by 12.8 % in order to demonstrate improved self-perceived disability and overall function while meeting MCID.  Baseline: Goal status: INITIAL   2.  Patient will improve  5 times sit to stand  test by  at least 4 seconds in order to demonstrate improved LE strength and endurance required for  prolonged ambulation. Baseline:  Goal status: INITIAL   3.  Patient will improve lumbar flexion ROM to be able to reach at least mid shin in order to demonstrate improved low back mobility needed for lower body dressing. Baseline:  Goal status: INITIAL   4.  Patient will report overall 75% improvement since beginning PT. Baseline:  Goal status: INITIAL  5. Patient will improve hamstring mobility by at least 5 degrees bilaterally in order to demonstrate improved LE flexibility needed for lower body dressing.  Baseline:  Goal status:  INITIAL   PLAN:  PT FREQUENCY: 2x/week  PT DURATION: 8 weeks  PLANNED INTERVENTIONS: [id number]- PT Re-evaluation, 97110-Therapeutic exercises, 97530- Therapeutic activity, [id number]- Neuromuscular re-education, 97535- Self Care, [id number]- Manual therapy, [id number]- Gait training, [phone]- Electrical stimulation (manual), [id number]- Ultrasound, [id number]- Traction (mechanical), [phone] (1-2 muscles), 20561 (3+ muscles)- Dry Needling, Patient/Family education, Balance training, Stair training, Taping, Joint mobilization, Spinal mobilization, Scar mobilization, Cryotherapy, and Moist heat.  PLAN FOR NEXT SESSION:  Introduce lumbar mobility exercises when appropriate, LE strengthening   Augustin Mclean, LPTA/CLT; CBIS [phone]   11:19 AM, 10/13/24    "

## 2024-10-17 ENCOUNTER — Ambulatory Visit (HOSPITAL_COMMUNITY): Admitting: Physical Therapy

## 2024-10-19 ENCOUNTER — Ambulatory Visit (HOSPITAL_COMMUNITY): Admitting: Physical Therapy

## 2024-10-23 ENCOUNTER — Ambulatory Visit (HOSPITAL_COMMUNITY)

## 2024-10-26 ENCOUNTER — Ambulatory Visit (HOSPITAL_COMMUNITY): Admitting: Physical Therapy

## 2024-10-31 ENCOUNTER — Ambulatory Visit (HOSPITAL_COMMUNITY)

## 2024-11-03 ENCOUNTER — Ambulatory Visit (HOSPITAL_COMMUNITY)

## 2024-11-06 ENCOUNTER — Ambulatory Visit (HOSPITAL_COMMUNITY)

## 2024-11-09 ENCOUNTER — Ambulatory Visit (HOSPITAL_COMMUNITY)

## 2024-11-13 ENCOUNTER — Ambulatory Visit (HOSPITAL_COMMUNITY)

## 2024-11-16 ENCOUNTER — Ambulatory Visit (HOSPITAL_COMMUNITY)

## 2024-11-20 ENCOUNTER — Ambulatory Visit (HOSPITAL_COMMUNITY)

## 2024-11-23 ENCOUNTER — Ambulatory Visit (HOSPITAL_COMMUNITY)

## 2024-11-27 ENCOUNTER — Ambulatory Visit (HOSPITAL_COMMUNITY)

## 2024-11-30 ENCOUNTER — Ambulatory Visit (HOSPITAL_COMMUNITY)
